# Patient Record
Sex: Female | Born: 1970 | Race: White | Hispanic: No | Marital: Single | Smoking: Former smoker
Health system: Southern US, Community
[De-identification: ages and names within clinical notes are randomized; demographics above are authoritative.]

## PROBLEM LIST (undated history)

## (undated) DIAGNOSIS — D649 Anemia, unspecified: Secondary | ICD-10-CM

## (undated) DIAGNOSIS — E8801 Alpha-1-antitrypsin deficiency: Secondary | ICD-10-CM

## (undated) DIAGNOSIS — J449 Chronic obstructive pulmonary disease, unspecified: Secondary | ICD-10-CM

## (undated) DIAGNOSIS — I10 Essential (primary) hypertension: Secondary | ICD-10-CM

## (undated) DIAGNOSIS — J439 Emphysema, unspecified: Secondary | ICD-10-CM

## (undated) DIAGNOSIS — E119 Type 2 diabetes mellitus without complications: Secondary | ICD-10-CM

## (undated) DIAGNOSIS — F419 Anxiety disorder, unspecified: Secondary | ICD-10-CM

## (undated) DIAGNOSIS — J45909 Unspecified asthma, uncomplicated: Secondary | ICD-10-CM

## (undated) HISTORY — PX: PEG TUBE PLACEMENT: SUR1034

## (undated) HISTORY — PX: PORTACATH PLACEMENT: SHX2246

## (undated) HISTORY — PX: TRACHEOSTOMY: SUR1362

---

## 2017-08-22 ENCOUNTER — Emergency Department (HOSPITAL_COMMUNITY): Payer: Medicare Other

## 2017-08-22 ENCOUNTER — Encounter (HOSPITAL_COMMUNITY): Payer: Self-pay

## 2017-08-22 ENCOUNTER — Inpatient Hospital Stay (HOSPITAL_COMMUNITY)
Admission: EM | Admit: 2017-08-22 | Discharge: 2017-09-25 | DRG: 314 | Disposition: A | Discharge status: Other Acute Inpatient Hospital | Payer: Medicare Other | Source: Other Acute Inpatient Hospital | Attending: Pulmonary Disease | Admitting: Pulmonary Disease

## 2017-08-22 ENCOUNTER — Other Ambulatory Visit: Payer: Self-pay

## 2017-08-22 ENCOUNTER — Inpatient Hospital Stay (HOSPITAL_COMMUNITY): Payer: Medicare Other

## 2017-08-22 DIAGNOSIS — J95851 Ventilator associated pneumonia: Secondary | ICD-10-CM | POA: Diagnosis present

## 2017-08-22 DIAGNOSIS — E876 Hypokalemia: Secondary | ICD-10-CM | POA: Diagnosis not present

## 2017-08-22 DIAGNOSIS — Z930 Tracheostomy status: Secondary | ICD-10-CM

## 2017-08-22 DIAGNOSIS — R0902 Hypoxemia: Secondary | ICD-10-CM | POA: Diagnosis not present

## 2017-08-22 DIAGNOSIS — Z91038 Other insect allergy status: Secondary | ICD-10-CM

## 2017-08-22 DIAGNOSIS — B957 Other staphylococcus as the cause of diseases classified elsewhere: Secondary | ICD-10-CM | POA: Diagnosis present

## 2017-08-22 DIAGNOSIS — B9689 Other specified bacterial agents as the cause of diseases classified elsewhere: Secondary | ICD-10-CM | POA: Diagnosis not present

## 2017-08-22 DIAGNOSIS — J9621 Acute and chronic respiratory failure with hypoxia: Secondary | ICD-10-CM

## 2017-08-22 DIAGNOSIS — R1310 Dysphagia, unspecified: Secondary | ICD-10-CM | POA: Diagnosis present

## 2017-08-22 DIAGNOSIS — Z91040 Latex allergy status: Secondary | ICD-10-CM

## 2017-08-22 DIAGNOSIS — Z515 Encounter for palliative care: Secondary | ICD-10-CM

## 2017-08-22 DIAGNOSIS — Z7189 Other specified counseling: Secondary | ICD-10-CM

## 2017-08-22 DIAGNOSIS — Z888 Allergy status to other drugs, medicaments and biological substances status: Secondary | ICD-10-CM

## 2017-08-22 DIAGNOSIS — E870 Hyperosmolality and hypernatremia: Secondary | ICD-10-CM | POA: Diagnosis not present

## 2017-08-22 DIAGNOSIS — T80211A Bloodstream infection due to central venous catheter, initial encounter: Secondary | ICD-10-CM | POA: Diagnosis present

## 2017-08-22 DIAGNOSIS — F418 Other specified anxiety disorders: Secondary | ICD-10-CM | POA: Diagnosis not present

## 2017-08-22 DIAGNOSIS — D649 Anemia, unspecified: Secondary | ICD-10-CM | POA: Diagnosis not present

## 2017-08-22 DIAGNOSIS — J810 Acute pulmonary edema: Secondary | ICD-10-CM | POA: Diagnosis not present

## 2017-08-22 DIAGNOSIS — J9690 Respiratory failure, unspecified, unspecified whether with hypoxia or hypercapnia: Secondary | ICD-10-CM

## 2017-08-22 DIAGNOSIS — R0602 Shortness of breath: Secondary | ICD-10-CM | POA: Diagnosis not present

## 2017-08-22 DIAGNOSIS — Z7951 Long term (current) use of inhaled steroids: Secondary | ICD-10-CM

## 2017-08-22 DIAGNOSIS — I10 Essential (primary) hypertension: Secondary | ICD-10-CM | POA: Diagnosis present

## 2017-08-22 DIAGNOSIS — Z79899 Other long term (current) drug therapy: Secondary | ICD-10-CM

## 2017-08-22 DIAGNOSIS — R0603 Acute respiratory distress: Secondary | ICD-10-CM | POA: Diagnosis not present

## 2017-08-22 DIAGNOSIS — Z9911 Dependence on respirator [ventilator] status: Secondary | ICD-10-CM | POA: Diagnosis not present

## 2017-08-22 DIAGNOSIS — Z91048 Other nonmedicinal substance allergy status: Secondary | ICD-10-CM

## 2017-08-22 DIAGNOSIS — R0689 Other abnormalities of breathing: Secondary | ICD-10-CM | POA: Diagnosis not present

## 2017-08-22 DIAGNOSIS — E8801 Alpha-1-antitrypsin deficiency: Secondary | ICD-10-CM | POA: Diagnosis present

## 2017-08-22 DIAGNOSIS — R739 Hyperglycemia, unspecified: Secondary | ICD-10-CM | POA: Diagnosis not present

## 2017-08-22 DIAGNOSIS — E1165 Type 2 diabetes mellitus with hyperglycemia: Secondary | ICD-10-CM | POA: Diagnosis present

## 2017-08-22 DIAGNOSIS — R4182 Altered mental status, unspecified: Secondary | ICD-10-CM | POA: Diagnosis not present

## 2017-08-22 DIAGNOSIS — R34 Anuria and oliguria: Secondary | ICD-10-CM | POA: Diagnosis not present

## 2017-08-22 DIAGNOSIS — I959 Hypotension, unspecified: Secondary | ICD-10-CM | POA: Diagnosis present

## 2017-08-22 DIAGNOSIS — R7881 Bacteremia: Secondary | ICD-10-CM | POA: Diagnosis not present

## 2017-08-22 DIAGNOSIS — R451 Restlessness and agitation: Secondary | ICD-10-CM | POA: Diagnosis not present

## 2017-08-22 DIAGNOSIS — Z87891 Personal history of nicotine dependence: Secondary | ICD-10-CM | POA: Diagnosis not present

## 2017-08-22 DIAGNOSIS — J9610 Chronic respiratory failure, unspecified whether with hypoxia or hypercapnia: Secondary | ICD-10-CM

## 2017-08-22 DIAGNOSIS — J9622 Acute and chronic respiratory failure with hypercapnia: Secondary | ICD-10-CM

## 2017-08-22 DIAGNOSIS — Z931 Gastrostomy status: Secondary | ICD-10-CM

## 2017-08-22 DIAGNOSIS — J9611 Chronic respiratory failure with hypoxia: Secondary | ICD-10-CM | POA: Diagnosis not present

## 2017-08-22 DIAGNOSIS — J9601 Acute respiratory failure with hypoxia: Secondary | ICD-10-CM

## 2017-08-22 DIAGNOSIS — F410 Panic disorder [episodic paroxysmal anxiety] without agoraphobia: Secondary | ICD-10-CM | POA: Diagnosis present

## 2017-08-22 DIAGNOSIS — D696 Thrombocytopenia, unspecified: Secondary | ICD-10-CM | POA: Diagnosis not present

## 2017-08-22 DIAGNOSIS — Z91018 Allergy to other foods: Secondary | ICD-10-CM

## 2017-08-22 DIAGNOSIS — D638 Anemia in other chronic diseases classified elsewhere: Secondary | ICD-10-CM | POA: Diagnosis present

## 2017-08-22 DIAGNOSIS — R0600 Dyspnea, unspecified: Secondary | ICD-10-CM

## 2017-08-22 DIAGNOSIS — R110 Nausea: Secondary | ICD-10-CM | POA: Diagnosis not present

## 2017-08-22 DIAGNOSIS — F411 Generalized anxiety disorder: Secondary | ICD-10-CM | POA: Diagnosis present

## 2017-08-22 DIAGNOSIS — E872 Acidosis: Secondary | ICD-10-CM | POA: Diagnosis present

## 2017-08-22 DIAGNOSIS — Z95828 Presence of other vascular implants and grafts: Secondary | ICD-10-CM | POA: Diagnosis not present

## 2017-08-22 DIAGNOSIS — Z7401 Bed confinement status: Secondary | ICD-10-CM

## 2017-08-22 DIAGNOSIS — R0682 Tachypnea, not elsewhere classified: Secondary | ICD-10-CM | POA: Diagnosis not present

## 2017-08-22 DIAGNOSIS — J441 Chronic obstructive pulmonary disease with (acute) exacerbation: Secondary | ICD-10-CM | POA: Diagnosis not present

## 2017-08-22 DIAGNOSIS — I9589 Other hypotension: Secondary | ICD-10-CM | POA: Diagnosis not present

## 2017-08-22 DIAGNOSIS — F419 Anxiety disorder, unspecified: Secondary | ICD-10-CM

## 2017-08-22 DIAGNOSIS — Z538 Procedure and treatment not carried out for other reasons: Secondary | ICD-10-CM | POA: Diagnosis not present

## 2017-08-22 DIAGNOSIS — T17990A Other foreign object in respiratory tract, part unspecified in causing asphyxiation, initial encounter: Secondary | ICD-10-CM | POA: Diagnosis present

## 2017-08-22 DIAGNOSIS — E669 Obesity, unspecified: Secondary | ICD-10-CM | POA: Diagnosis present

## 2017-08-22 DIAGNOSIS — E11649 Type 2 diabetes mellitus with hypoglycemia without coma: Secondary | ICD-10-CM | POA: Diagnosis not present

## 2017-08-22 DIAGNOSIS — Y848 Other medical procedures as the cause of abnormal reaction of the patient, or of later complication, without mention of misadventure at the time of the procedure: Secondary | ICD-10-CM | POA: Diagnosis present

## 2017-08-22 DIAGNOSIS — Z6831 Body mass index (BMI) 31.0-31.9, adult: Secondary | ICD-10-CM

## 2017-08-22 DIAGNOSIS — B9562 Methicillin resistant Staphylococcus aureus infection as the cause of diseases classified elsewhere: Secondary | ICD-10-CM | POA: Diagnosis not present

## 2017-08-22 DIAGNOSIS — Z794 Long term (current) use of insulin: Secondary | ICD-10-CM

## 2017-08-22 DIAGNOSIS — Z91013 Allergy to seafood: Secondary | ICD-10-CM

## 2017-08-22 HISTORY — DX: Chronic obstructive pulmonary disease, unspecified: J44.9

## 2017-08-22 HISTORY — DX: Anemia, unspecified: D64.9

## 2017-08-22 HISTORY — DX: Essential (primary) hypertension: I10

## 2017-08-22 HISTORY — DX: Unspecified asthma, uncomplicated: J45.909

## 2017-08-22 HISTORY — DX: Alpha-1-antitrypsin deficiency: E88.01

## 2017-08-22 HISTORY — DX: Type 2 diabetes mellitus without complications: E11.9

## 2017-08-22 HISTORY — DX: Anxiety disorder, unspecified: F41.9

## 2017-08-22 HISTORY — DX: Emphysema, unspecified: J43.9

## 2017-08-22 LAB — I-STAT BETA HCG BLOOD, ED (MC, WL, AP ONLY): I-stat hCG, quantitative: <5 m[IU]/mL (ref <5)

## 2017-08-22 LAB — COMPREHENSIVE METABOLIC PANEL
ALT: 29 U/L (ref 14–54)
AST: 30 U/L (ref 15–41)
Albumin: 3.3 g/dL — ABNORMAL LOW (ref 3.5–5.0)
Alkaline Phosphatase: 87 U/L (ref 38–126)
Anion gap: 15 (ref 5–15)
BUN: 35 mg/dL — AB (ref 6–20)
CHLORIDE: 90 mmol/L — AB (ref 101–111)
CO2: 33 mmol/L — AB (ref 22–32)
CREATININE: 0.88 mg/dL (ref 0.44–1.00)
Calcium: 10.4 mg/dL — ABNORMAL HIGH (ref 8.9–10.3)
GFR calc Af Amer: >60 mL/min (ref >60)
GLUCOSE: 204 mg/dL — AB (ref 65–99)
Potassium: 5.3 mmol/L — ABNORMAL HIGH (ref 3.5–5.1)
Sodium: 138 mmol/L (ref 135–145)
Total Bilirubin: 0.6 mg/dL (ref 0.3–1.2)
Total Protein: 7 g/dL (ref 6.5–8.1)

## 2017-08-22 LAB — URINALYSIS, ROUTINE W REFLEX MICROSCOPIC
Bilirubin Urine: NEGATIVE
GLUCOSE, UA: NEGATIVE mg/dL
HGB URINE DIPSTICK: NEGATIVE
KETONES UR: NEGATIVE mg/dL
NITRITE: NEGATIVE
PH: 5 (ref 5.0–8.0)
PROTEIN: NEGATIVE mg/dL
Specific Gravity, Urine: 1.016 (ref 1.005–1.030)

## 2017-08-22 LAB — CBC WITH DIFFERENTIAL/PLATELET
BASOS PCT: 0 %
Basophils Absolute: 0 10*3/uL (ref 0.0–0.1)
EOS PCT: 0 %
Eosinophils Absolute: 0 10*3/uL (ref 0.0–0.7)
HEMATOCRIT: 38.1 % (ref 36.0–46.0)
HEMOGLOBIN: 10.2 g/dL — AB (ref 12.0–15.0)
Lymphocytes Relative: 2 %
Lymphs Abs: 0.9 10*3/uL (ref 0.7–4.0)
MCH: 23.4 pg — ABNORMAL LOW (ref 26.0–34.0)
MCHC: 26.8 g/dL — ABNORMAL LOW (ref 30.0–36.0)
MCV: 87.6 fL (ref 78.0–100.0)
MONOS PCT: 10 %
Monocytes Absolute: 4.5 10*3/uL — ABNORMAL HIGH (ref 0.1–1.0)
NEUTROS ABS: 39.3 10*3/uL — AB (ref 1.7–7.7)
Neutrophils Relative %: 88 %
Platelets: 566 10*3/uL — ABNORMAL HIGH (ref 150–400)
RBC: 4.35 MIL/uL (ref 3.87–5.11)
RDW: 19.8 % — ABNORMAL HIGH (ref 11.5–15.5)
SMEAR REVIEW: INCREASED
WBC: 44.7 10*3/uL — ABNORMAL HIGH (ref 4.0–10.5)

## 2017-08-22 LAB — BLOOD GAS, ARTERIAL
Acid-Base Excess: 11.1 mmol/L — ABNORMAL HIGH (ref 0.0–2.0)
Acid-Base Excess: 11 mmol/L — ABNORMAL HIGH (ref 0.0–2.0)
BICARBONATE: 39 mmol/L — AB (ref 20.0–28.0)
Bicarbonate: 39.8 mmol/L — ABNORMAL HIGH (ref 20.0–28.0)
Drawn by: 398991
Drawn by: 256031
FIO2: 100
FIO2: 90
LHR: 24 {breaths}/min
LHR: 20 {breaths}/min
MECHVT: 380 mL
O2 SAT: 99 %
O2 SAT: 99.6 %
PATIENT TEMPERATURE: 97.8
PATIENT TEMPERATURE: 98.6
PCO2 ART: 114 mmHg — AB (ref 32.0–48.0)
PCO2 ART: 103 mmHg — AB (ref 32.0–48.0)
PEEP: 5 cmH2O
PEEP: 5 cmH2O
PH ART: 7.166 — AB (ref 7.350–7.450)
PH ART: 7.204 — AB (ref 7.350–7.450)
PO2 ART: 249 mmHg — AB (ref 83.0–108.0)
VT: 380 mL
pO2, Arterial: 286 mmHg — ABNORMAL HIGH (ref 83.0–108.0)

## 2017-08-22 LAB — PROTIME-INR
INR: 1.07
Prothrombin Time: 13.8 seconds (ref 11.4–15.2)

## 2017-08-22 LAB — I-STAT CG4 LACTIC ACID, ED: LACTIC ACID, VENOUS: 1.23 mmol/L (ref 0.5–1.9)

## 2017-08-22 LAB — GLUCOSE, CAPILLARY
GLUCOSE-CAPILLARY: 224 mg/dL — AB (ref 65–99)
Glucose-Capillary: 228 mg/dL — ABNORMAL HIGH (ref 65–99)

## 2017-08-22 LAB — MAGNESIUM: Magnesium: 2.6 mg/dL — ABNORMAL HIGH (ref 1.7–2.4)

## 2017-08-22 LAB — CBG MONITORING, ED: Glucose-Capillary: 209 mg/dL — ABNORMAL HIGH (ref 65–99)

## 2017-08-22 LAB — D-DIMER, QUANTITATIVE: D-Dimer, Quant: 1.03 ug/mL-FEU — ABNORMAL HIGH (ref 0.00–0.50)

## 2017-08-22 MED ORDER — INSULIN ASPART 100 UNIT/ML ~~LOC~~ SOLN
2.0000 [IU] | SUBCUTANEOUS | Status: DC
  Administered 2017-08-22: 6 [IU] via SUBCUTANEOUS
  Administered 2017-08-23: 4 [IU] via SUBCUTANEOUS
  Administered 2017-08-23 – 2017-08-24 (×3): 2 [IU] via SUBCUTANEOUS
  Administered 2017-08-24: 4 [IU] via SUBCUTANEOUS
  Administered 2017-08-24: 2 [IU] via SUBCUTANEOUS
  Administered 2017-08-24: 4 [IU] via SUBCUTANEOUS
  Administered 2017-08-25: 6 [IU] via SUBCUTANEOUS
  Administered 2017-08-25: 4 [IU] via SUBCUTANEOUS
  Administered 2017-08-25: 6 [IU] via SUBCUTANEOUS
  Administered 2017-08-25: 2 [IU] via SUBCUTANEOUS
  Administered 2017-08-25 – 2017-08-26 (×3): 6 [IU] via SUBCUTANEOUS
  Administered 2017-08-27: 4 [IU] via SUBCUTANEOUS
  Administered 2017-08-27: 2 [IU] via SUBCUTANEOUS
  Administered 2017-08-27 – 2017-08-28 (×4): 4 [IU] via SUBCUTANEOUS
  Administered 2017-08-28: 2 [IU] via SUBCUTANEOUS
  Administered 2017-08-28 (×2): 4 [IU] via SUBCUTANEOUS
  Administered 2017-08-29: 6 [IU] via SUBCUTANEOUS
  Administered 2017-08-29: 2 [IU] via SUBCUTANEOUS
  Administered 2017-08-29: 4 [IU] via SUBCUTANEOUS
  Administered 2017-08-29: 2 [IU] via SUBCUTANEOUS
  Administered 2017-08-30: 4 [IU] via SUBCUTANEOUS
  Administered 2017-08-30: 2 [IU] via SUBCUTANEOUS
  Administered 2017-08-30 – 2017-08-31 (×2): 4 [IU] via SUBCUTANEOUS
  Administered 2017-08-31: 2 [IU] via SUBCUTANEOUS
  Administered 2017-08-31: 6 [IU] via SUBCUTANEOUS
  Administered 2017-08-31: 4 [IU] via SUBCUTANEOUS
  Administered 2017-09-01 (×2): 2 [IU] via SUBCUTANEOUS

## 2017-08-22 MED ORDER — FAMOTIDINE IN NACL 20-0.9 MG/50ML-% IV SOLN
20.0000 mg | Freq: Two times a day (BID) | INTRAVENOUS | Status: DC
  Administered 2017-08-22 – 2017-08-30 (×16): 20 mg via INTRAVENOUS
  Filled 2017-08-22 (×18): qty 50

## 2017-08-22 MED ORDER — ENOXAPARIN SODIUM 40 MG/0.4ML ~~LOC~~ SOLN
40.0000 mg | SUBCUTANEOUS | Status: AC
  Administered 2017-08-22 – 2017-08-25 (×4): 40 mg via SUBCUTANEOUS
  Filled 2017-08-22 (×4): qty 0.4

## 2017-08-22 MED ORDER — IPRATROPIUM-ALBUTEROL 0.5-2.5 (3) MG/3ML IN SOLN
3.0000 mL | RESPIRATORY_TRACT | Status: DC | PRN
  Administered 2017-08-22 (×2): 3 mL via RESPIRATORY_TRACT
  Filled 2017-08-22 (×2): qty 3
  Filled 2017-08-22: qty 6

## 2017-08-22 MED ORDER — METHYLPREDNISOLONE SODIUM SUCC 125 MG IJ SOLR
60.0000 mg | Freq: Four times a day (QID) | INTRAMUSCULAR | Status: DC
  Administered 2017-08-22 – 2017-08-23 (×3): 60 mg via INTRAVENOUS
  Filled 2017-08-22 (×3): qty 2

## 2017-08-22 MED ORDER — CHLORHEXIDINE GLUCONATE CLOTH 2 % EX PADS
6.0000 | MEDICATED_PAD | Freq: Every day | CUTANEOUS | Status: DC
  Administered 2017-08-22 – 2017-09-24 (×30): 6 via TOPICAL

## 2017-08-22 MED ORDER — CHLORHEXIDINE GLUCONATE 0.12% ORAL RINSE (MEDLINE KIT)
15.0000 mL | Freq: Two times a day (BID) | OROMUCOSAL | Status: DC
  Administered 2017-08-22 – 2017-08-23 (×3): 15 mL via OROMUCOSAL

## 2017-08-22 MED ORDER — IPRATROPIUM-ALBUTEROL 0.5-2.5 (3) MG/3ML IN SOLN: 3.0000 mL | Freq: Once | RESPIRATORY_TRACT | Status: DC

## 2017-08-22 MED ORDER — ALBUTEROL SULFATE (2.5 MG/3ML) 0.083% IN NEBU
INHALATION_SOLUTION | RESPIRATORY_TRACT | Status: AC
  Administered 2017-08-22: 5 mg via RESPIRATORY_TRACT
  Filled 2017-08-22: qty 6

## 2017-08-22 MED ORDER — BUDESONIDE 0.5 MG/2ML IN SUSP
0.5000 mg | Freq: Two times a day (BID) | RESPIRATORY_TRACT | Status: DC
  Administered 2017-08-22 – 2017-09-25 (×67): 0.5 mg via RESPIRATORY_TRACT
  Filled 2017-08-22 (×70): qty 2

## 2017-08-22 MED ORDER — ARFORMOTEROL TARTRATE 15 MCG/2ML IN NEBU
15.0000 ug | INHALATION_SOLUTION | Freq: Two times a day (BID) | RESPIRATORY_TRACT | Status: DC
  Administered 2017-08-22 – 2017-09-16 (×50): 15 ug via RESPIRATORY_TRACT
  Filled 2017-08-22 (×50): qty 2

## 2017-08-22 MED ORDER — IPRATROPIUM-ALBUTEROL 0.5-2.5 (3) MG/3ML IN SOLN
3.0000 mL | RESPIRATORY_TRACT | Status: AC | PRN
  Administered 2017-08-22 (×2): 3 mL via RESPIRATORY_TRACT

## 2017-08-22 MED ORDER — METHYLPREDNISOLONE SODIUM SUCC 125 MG IJ SOLR: 60.0000 mg | Freq: Four times a day (QID) | INTRAMUSCULAR | Status: DC

## 2017-08-22 MED ORDER — SODIUM CHLORIDE 0.9% FLUSH: 10.0000 mL | INTRAVENOUS | Status: DC | PRN

## 2017-08-22 MED ORDER — CLONAZEPAM 0.5 MG PO TBDP
1.0000 mg | ORAL_TABLET | Freq: Three times a day (TID) | ORAL | Status: DC
  Administered 2017-08-22 – 2017-09-06 (×43): 1 mg
  Filled 2017-08-22 (×44): qty 2

## 2017-08-22 MED ORDER — FENTANYL 2500MCG IN NS 250ML (10MCG/ML) PREMIX INFUSION
0.0000 ug/h | INTRAVENOUS | Status: DC
Start: 2017-08-22 — End: 2017-08-24
  Administered 2017-08-22: 50 ug/h via INTRAVENOUS
  Administered 2017-08-23: 150 ug/h via INTRAVENOUS
  Administered 2017-08-23: 300 ug/h via INTRAVENOUS
  Administered 2017-08-24: 150 ug/h via INTRAVENOUS
  Filled 2017-08-22 (×4): qty 250

## 2017-08-22 MED ORDER — LEVOFLOXACIN IN D5W 750 MG/150ML IV SOLN
750.0000 mg | Freq: Every day | INTRAVENOUS | Status: DC
  Administered 2017-08-22 – 2017-08-25 (×4): 750 mg via INTRAVENOUS
  Filled 2017-08-22 (×4): qty 150

## 2017-08-22 MED ORDER — ORAL CARE MOUTH RINSE
15.0000 mL | OROMUCOSAL | Status: DC
  Administered 2017-08-22 – 2017-08-24 (×15): 15 mL via OROMUCOSAL

## 2017-08-22 MED ORDER — FENTANYL CITRATE (PF) 100 MCG/2ML IJ SOLN
50.0000 ug | INTRAMUSCULAR | Status: DC | PRN
  Administered 2017-08-22 – 2017-08-23 (×2): 100 ug via INTRAVENOUS
  Administered 2017-08-23: 50 ug via INTRAVENOUS
  Administered 2017-08-23 (×2): 100 ug via INTRAVENOUS
  Administered 2017-08-23: 50 ug via INTRAVENOUS
  Administered 2017-08-24: 100 ug via INTRAVENOUS
  Filled 2017-08-22 (×3): qty 2

## 2017-08-22 MED ORDER — MIDAZOLAM HCL 2 MG/2ML IJ SOLN
4.0000 mg | Freq: Once | INTRAMUSCULAR | Status: AC
  Administered 2017-08-22: 4 mg via INTRAVENOUS
  Filled 2017-08-22: qty 4

## 2017-08-22 MED ORDER — IPRATROPIUM-ALBUTEROL 0.5-2.5 (3) MG/3ML IN SOLN
3.0000 mL | RESPIRATORY_TRACT | Status: DC
  Administered 2017-08-22 – 2017-09-01 (×57): 3 mL via RESPIRATORY_TRACT
  Filled 2017-08-22 (×56): qty 3

## 2017-08-22 MED ORDER — MONTELUKAST SODIUM 10 MG PO TABS
10.0000 mg | ORAL_TABLET | Freq: Every day | ORAL | Status: DC
  Administered 2017-08-22 – 2017-08-25 (×4): 10 mg via ORAL
  Filled 2017-08-22 (×4): qty 1

## 2017-08-22 MED ORDER — LORAZEPAM 2 MG/ML IJ SOLN
2.0000 mg | INTRAMUSCULAR | Status: DC | PRN
  Administered 2017-08-22 – 2017-08-28 (×16): 2 mg via INTRAVENOUS
  Filled 2017-08-22 (×16): qty 1

## 2017-08-22 MED ORDER — SODIUM CHLORIDE 0.9 % IV SOLN
250.0000 mL | INTRAVENOUS | Status: DC | PRN
  Administered 2017-08-22: 250 mL via INTRAVENOUS
  Administered 2017-08-26: 12:00:00 via INTRAVENOUS
  Administered 2017-08-27: 250 mL via INTRAVENOUS

## 2017-08-22 MED ORDER — IPRATROPIUM BROMIDE 0.02 % IN SOLN
0.5000 mg | Freq: Once | RESPIRATORY_TRACT | Status: AC
  Administered 2017-08-22: 0.5 mg via RESPIRATORY_TRACT
  Filled 2017-08-22: qty 2.5

## 2017-08-22 MED ORDER — INSULIN ASPART 100 UNIT/ML ~~LOC~~ SOLN: 0.0000 [IU] | Freq: Three times a day (TID) | SUBCUTANEOUS | Status: DC

## 2017-08-22 MED ORDER — BUSPIRONE HCL 5 MG PO TABS
15.0000 mg | ORAL_TABLET | Freq: Two times a day (BID) | ORAL | Status: DC
  Administered 2017-08-22 – 2017-08-25 (×7): 15 mg via ORAL
  Filled 2017-08-22 (×7): qty 3

## 2017-08-22 MED ORDER — ALBUTEROL SULFATE (2.5 MG/3ML) 0.083% IN NEBU
2.5000 mg | INHALATION_SOLUTION | RESPIRATORY_TRACT | Status: DC | PRN
  Administered 2017-08-23 – 2017-09-22 (×21): 2.5 mg via RESPIRATORY_TRACT
  Filled 2017-08-22 (×22): qty 3

## 2017-08-22 MED ORDER — ALBUTEROL SULFATE (2.5 MG/3ML) 0.083% IN NEBU
5.0000 mg | INHALATION_SOLUTION | Freq: Once | RESPIRATORY_TRACT | Status: AC
  Administered 2017-08-22: 5 mg via RESPIRATORY_TRACT

## 2017-08-22 MED ORDER — INSULIN GLARGINE 100 UNIT/ML ~~LOC~~ SOLN
10.0000 [IU] | Freq: Every day | SUBCUTANEOUS | Status: DC
  Administered 2017-08-22 – 2017-08-23 (×2): 10 [IU] via SUBCUTANEOUS
  Filled 2017-08-22 (×2): qty 0.1

## 2017-08-22 MED ORDER — PAROXETINE HCL 30 MG PO TABS
30.0000 mg | ORAL_TABLET | Freq: Every day | ORAL | Status: DC
  Administered 2017-08-23 – 2017-09-25 (×34): 30 mg
  Filled 2017-08-22 (×40): qty 1

## 2017-08-22 MED ORDER — SODIUM CHLORIDE 0.9% FLUSH
10.0000 mL | Freq: Two times a day (BID) | INTRAVENOUS | Status: DC
  Administered 2017-08-22: 20 mL
  Administered 2017-08-24 – 2017-09-08 (×21): 10 mL
  Administered 2017-09-08: 20 mL
  Administered 2017-09-09 – 2017-09-18 (×15): 10 mL
  Administered 2017-09-19 – 2017-09-20 (×2): 20 mL
  Administered 2017-09-20 – 2017-09-25 (×9): 10 mL

## 2017-08-22 MED ORDER — ACETYLCYSTEINE 20 % IN SOLN
4.0000 mL | RESPIRATORY_TRACT | Status: DC
  Administered 2017-08-22 – 2017-08-24 (×9): 4 mL via RESPIRATORY_TRACT
  Filled 2017-08-22 (×12): qty 4

## 2017-08-22 MED ORDER — LORAZEPAM 2 MG/ML IJ SOLN
1.0000 mg | Freq: Once | INTRAMUSCULAR | Status: AC
  Administered 2017-08-22: 1 mg via INTRAVENOUS
  Filled 2017-08-22: qty 1

## 2017-08-22 MED ORDER — ALBUTEROL (5 MG/ML) CONTINUOUS INHALATION SOLN: 5.0000 mg/h | INHALATION_SOLUTION | Freq: Once | RESPIRATORY_TRACT | Status: DC

## 2017-08-22 MED ORDER — METHYLPREDNISOLONE SODIUM SUCC 125 MG IJ SOLR
125.0000 mg | Freq: Once | INTRAMUSCULAR | Status: AC
  Administered 2017-08-22: 125 mg via INTRAVENOUS
  Filled 2017-08-22: qty 2

## 2017-08-22 NOTE — ED Notes (Signed)
Patient is stable and ready to be transport to the floor at this time.  Report was called to 3M RN.  Belongings taken with the patient to the floor.   

## 2017-08-22 NOTE — ED Triage Notes (Signed)
Patient from [facility] for being unresponsive for an hour prior to EMS arrival, vent settings increased by staff at [facility] to max then called EMS.  EMS suctioned and gave duoneb.  Sats 98%, still red in the face, lung sounds decreased diffusely.

## 2017-08-22 NOTE — Progress Notes (Signed)
Post ventilator ABG results obtained.  Results given to MD.  Increased respiratory rate from 24 to 30,per MD, and decreased FIO2 to 70%.  Will continue to monitor.    Ref. Range 08/22/2017 17:00  Sample type Unknown ARTERIAL DRAW  Delivery systems Unknown VENTILATOR  FIO2 Unknown 100.00  Mode Unknown PRESSURE REGULATE...  VT Latest Units: mL 380  Peep/cpap Latest Units: cm H20 5.0  pH, Arterial Latest Ref Range: 7.350 - 7.450  7.166 (LL)  pCO2 arterial Latest Ref Range: 32.0 - 48.0 mmHg 114 (HH)  pO2, Arterial Latest Ref Range: 83.0 - 108.0 mmHg 249 (H)  Acid-Base Excess Latest Ref Range: 0.0 - 2.0 mmol/L 11.1 (H)  Bicarbonate Latest Ref Range: 20.0 - 28.0 mmol/L 39.8 (H)  O2 Saturation Latest Units: % 99.0  Patient temperature Unknown 97.8  Collection site Unknown RIGHT RADIAL  Allens test (pass/fail) Latest Ref Range: PASS  PASS

## 2017-08-22 NOTE — Progress Notes (Signed)
Asked to emergently evaluate patient with COPD, A1AT deficiency, DM, Anxiety, Trach on chronic vent, admitted with acute hypercapneic respiratory failure. Prior to my evaluation, patient had been wheezing and was given nebs and suctioned with large tan thick mucous plug returned. Despite this patient continued to have increased work of breathing. On my exam patient is sitting upright in bed and holding on to the bed rails. She has tachypnea with accessory muscle use. She reports feeling very anxious. Lungs diminished breath sounds bilaterally but no wheezing at this time. No LE edema. CXR on my exam shows no pneumothorax, edema, infiltrates, or effusions. Feel much of her symptoms at this time are due to her anxiety. Obtained ABG which shows mild improvement in her hypercapnea compared to earlier tonight. Regarding her anxiety, she says she is on buspar at home, and her records show 15mg  BID. Will resume this medication. She also takes klonopin 1mg  TID at home which she is receiving here. She has an order for Ativan IV PRN but records show last dose was at 17:29. Will ask RN to give it as needed (ordered q2hr PRN). Regarding her COPD, she says she is on an ICS/LABA at home. Therefore will start pulmicort and brovana nebs BID; continue duonebs q4hrs scheduled and solumedrol 60mg  IV q6hrs. Regarding her mucous plugging, will obtain sputum culture and start chest PT q4hrs to be given with the duonebs. Will start mucomyst nebs with chest PT as well. There is report that patient was unresponsive for an hour prior to EMS arriving at [facility], so concern that patient could've aspirated. Will start Levaquin and await culture results. Check lactate and procalcitonin. Regarding her DM, she remains hyperglycemic. Will increase her SSI from TID to q4hr checks. Will plan to repeat ABG at 1am.   60 minutes critical care time  Milana Obey, MD Pulmonary & Critical Care Medicine Pager: [phone]

## 2017-08-22 NOTE — Progress Notes (Signed)
Patient arrived via EMS being bagged and in respiratory distress.  Placed patient on ventilator and attempted to suction patient however only able to obtain a small amount of pink tinged secretions.  Upon auscultation, patient noted to have very decreased lung sounds and not moving any air.  Attempted to bag lavage patient however with no improvement.  Gave patient 5mg  Albuterol and 0.5mg  Atrovent nebulizer treatment.  Patient still noted to be diminished throughout.  Will obtain an ABG.  Will continue to monitor.

## 2017-08-22 NOTE — ED Notes (Signed)
Patient unable to lay back or roll for a rectal temp or In and Out cath, once patient is able to tolerate moving specimens will be collected.

## 2017-08-22 NOTE — ED Provider Notes (Signed)
[facility] Provider Note   CSN: [id number] Arrival date & time: 08/22/17  1554     History   Chief Complaint Chief Complaint  Patient presents with  . Respiratory Distress  . Shortness of Breath    HPI Tanika Bracco is a 47 y.o. female.  Level 5 caveat due to patient being with tracheotomy.  According to nursing home patient had sudden need for increase in FiO2 as she appeared to have increased work of breathing and tachypnea but no increased sputum production.  Patient has not had fever.  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  Patient also has history of insulin-dependent diabetes.  Nursing home states that patient currently was on 10 of PEEP and 60 of FiO2 and on pressure support and rate of 30.  Concern for COPD exacerbation possible pneumonia per nursing home/RT. Has hx of panic attacks.   The history is provided by the EMS personnel and the nursing home.   Shortness of Breath   This is a chronic problem. The problem occurs frequently.The current episode started less than 1 hour ago. The problem has been rapidly improving. It is unknown what precipitated the problem. She has tried nothing for the symptoms. The treatment provided no relief. She has had prior hospitalizations. Associated medical issues include COPD.    Past Medical History:  Diagnosis Date  . Alpha-1-antitrypsin deficiency ([place])   . Anemia   . Anxiety   . Asthma   . COPD (chronic obstructive pulmonary disease) ([place])   . Diabetes mellitus without complication ([place])   . Emphysema lung ([place])   . Hypertension     Patient Active Problem List   Diagnosis Date Noted  . Hypercarbia 08/22/2017    Past Surgical History:  Procedure Laterality Date  . PEG TUBE PLACEMENT    . TRACHEOSTOMY       OB History   None      Home Medications    Prior to Admission medications   Medication Sig Start Date End Date Taking?  Authorizing Provider  acetaZOLAMIDE (DIAMOX) 250 MG tablet Take 250 mg by mouth.   Yes [provider]  busPIRone (BUSPAR) 15 MG tablet Take 15 mg by mouth 2 (two) times daily.   Yes [provider]  calcium carbonate (TUMS - DOSED IN MG ELEMENTAL CALCIUM) 500 MG chewable tablet Place 1 tablet into feeding tube every 8 (eight) hours as needed for indigestion or heartburn.   Yes [provider]  cetirizine (ZYRTEC) 10 MG tablet Place 10 mg into feeding tube at bedtime.   Yes [provider]  clonazePAM (KLONOPIN) 1 MG tablet Place 1 mg into feeding tube 3 (three) times daily.   Yes [provider]  diltiazem (CARDIZEM) 30 MG tablet Take 30 mg by mouth every 8 (eight) hours.   Yes [provider]  doxepin (SINEQUAN) 10 MG capsule Place 10 mg into feeding tube at bedtime as needed (for insomnia).   Yes [provider]  enoxaparin (LOVENOX) 40 MG/0.4ML injection Inject 40 mg into the skin daily.   Yes [provider]  furosemide (LASIX) 40 MG tablet Place 40 mg into feeding tube daily.   Yes [provider]  Heparin Lock Flush (HEP-LOCK IV) Inject 500 Units into the vein every 12 (twelve) hours. "FOR PATENCY"   Yes [provider]  insulin aspart (NOVOLOG) 100 unit/mL injection Inject 3-13 Units into the skin See admin instructions. Inject  3-13 units into the skin three times a day before meals, per sliding scale: BGL 151-200 = 3 units; 201-250 = 5 units; 251-300 = 7 units; 301-350 = 9 units; 351-400 = 11 units; 401-450 = 13 units and call the MD   Yes [provider]  insulin detemir (LEVEMIR) 100 unit/ml SOLN Inject 18 Units into the skin at bedtime.   Yes [provider]  LORazepam (ATIVAN) 2 MG/ML concentrated solution Place 0.5 mg into feeding tube every 12 (twelve) hours as needed for anxiety.   Yes [provider]  metoprolol tartrate (LOPRESSOR) 50 MG tablet Place 50 mg into feeding  tube 2 times daily at 12 noon and 4 pm.   Yes [provider]  Metoprolol Tartrate (METOPROLOL TARTARATE 1 MG/ML SYRINGE, 5ML,) Inject 5 mg into the vein every 4 (four) hours as needed (for hypertension).   Yes [provider]  montelukast (SINGULAIR) 10 MG tablet Place 10 mg into feeding tube at bedtime.   Yes [provider]  omeprazole (PRILOSEC) 40 MG capsule 40 mg per tube every morning   Yes [provider]  ondansetron (ZOFRAN) 4 MG tablet Place 4 mg into feeding tube every 4 (four) hours as needed for nausea or vomiting.   Yes [provider]  oxyCODONE (OXY IR/ROXICODONE) 5 MG immediate release tablet Take 5 mg by mouth every 8 (eight) hours as needed for severe pain.   Yes [provider]  PARoxetine (PAXIL) 30 MG tablet Place 30 mg into feeding tube daily.   Yes [provider]  potassium chloride 20 MEQ/15ML (10%) SOLN Place 20 mEq into feeding tube daily.   Yes [provider]  PRESCRIPTION MEDICATION Hydralazine 20 mg/ml: 0.5 ml per tube every four hours as needed for hypertension   Yes [provider]  QUEtiapine (SEROQUEL) 50 MG tablet Place 50 mg into feeding tube 2 (two) times daily.   Yes [provider]  rOPINIRole (REQUIP) 0.5 MG tablet Place 0.5 mg into feeding tube at bedtime.   Yes [provider]    Family History History reviewed. No pertinent family history.  Social History Social History   Tobacco Use  . Smoking status: Former Smoker    Types: Cigarettes  . Smokeless tobacco: Never Used  Substance Use Topics  . Alcohol use: Not Currently  . Drug use: Never     Allergies   Wasp venom protein; Adhesive [tape]; Coconut oil; Latex; Robitussin severe multi-symp [[id number]-[id number]]; and Shellfish-derived products   Review of Systems Review of Systems  Unable to perform ROS: Patient nonverbal  Respiratory: Positive for shortness of breath.       Physical Exam Updated Vital Signs  ED Triage Vitals  Enc Vitals Group     BP 08/22/17 1600 118/84     Pulse Rate 08/22/17 1600 (!) 113     Resp 08/22/17 1600 (!) 25     Temp 08/22/17 1605 97.8 F (36.6 C)     Temp Source 08/22/17 1605 Temporal     SpO2 08/22/17 1600 98 %     Weight 08/22/17 1710 165 lb (74.8 kg)     Height 08/22/17 1710 _0  (1.626 m)     Head Circumference --      Peak Flow --      Pain Score --      Pain Loc --      Pain Edu? --      Excl. in [place]? --     Physical  Exam  Constitutional: She appears well-developed and well-nourished. No distress.  HENT:  Head: Normocephalic and atraumatic.  Eyes: Pupils are equal, round, and reactive to light. Conjunctivae and EOM are normal.  Neck: Normal range of motion. Neck supple.  Cardiovascular: Regular rhythm. Tachycardia present.  No murmur heard. Pulmonary/Chest: Tachypnea noted. She is in respiratory distress. She has decreased breath sounds.  Poor air flow, trach in place, no wheezes  Abdominal: Soft. Bowel sounds are normal. There is no tenderness.  Musculoskeletal: Normal range of motion. She exhibits no edema.  Neurological: She is alert.  Skin: Skin is warm and dry. Capillary refill takes less than 2 seconds.  Psychiatric: She has a normal mood and affect.  Nursing note and vitals reviewed.    ED Treatments / Results  Labs (all labs ordered are listed, but only abnormal results are displayed) Labs Reviewed  COMPREHENSIVE METABOLIC PANEL - Abnormal; Notable for the following components:      Result Value   Potassium 5.3 (*)    Chloride 90 (*)    CO2 33 (*)    Glucose, Bld 204 (*)    BUN 35 (*)    Calcium 10.4 (*)    Albumin 3.3 (*)    All other components within normal limits  CBC WITH DIFFERENTIAL/PLATELET - Abnormal; Notable for the following components:   WBC 44.7 (*)    Hemoglobin 10.2 (*)    MCH 23.4 (*)    MCHC 26.8 (*)    RDW 19.8 (*)    Platelets 566 (*)    Neutro Abs 39.3  (*)    Monocytes Absolute 4.5 (*)    All other components within normal limits  URINALYSIS, ROUTINE W REFLEX MICROSCOPIC - Abnormal; Notable for the following components:   APPearance HAZY (*)    Leukocytes, UA SMALL (*)    Bacteria, UA RARE (*)    All other components within normal limits  BLOOD GAS, ARTERIAL - Abnormal; Notable for the following components:   pH, Arterial 7.166 (*)    pCO2 arterial 114 (*)    pO2, Arterial 249 (*)    Bicarbonate 39.8 (*)    Acid-Base Excess 11.1 (*)    All other components within normal limits  MAGNESIUM - Abnormal; Notable for the following components:   Magnesium 2.6 (*)    All other components within normal limits  D-DIMER, QUANTITATIVE (NOT AT [facility]) - Abnormal; Notable for the following components:   D-Dimer, Quant 1.03 (*)    All other components within normal limits  GLUCOSE, CAPILLARY - Abnormal; Notable for the following components:   Glucose-Capillary 224 (*)    All other components within normal limits  BLOOD GAS, ARTERIAL - Abnormal; Notable for the following components:   pH, Arterial 7.204 (*)    pCO2 arterial 103 (*)    pO2, Arterial 286 (*)    Bicarbonate 39.0 (*)    Acid-Base Excess 11.0 (*)    All other components within normal limits  CBG MONITORING, ED - Abnormal; Notable for the following components:   Glucose-Capillary 209 (*)    All other components within normal limits  CULTURE, BLOOD (ROUTINE X 2)  CULTURE, BLOOD (ROUTINE X 2)  CULTURE, EXPECTORATED SPUTUM-ASSESSMENT  CULTURE, RESPIRATORY (NON-EXPECTORATED)  MRSA PCR SCREENING  CULTURE, RESPIRATORY (NON-EXPECTORATED)  PROTIME-INR  HIV ANTIBODY (ROUTINE TESTING)  BLOOD GAS, ARTERIAL  HEMOGLOBIN S2G  BASIC METABOLIC PANEL  CBC  MAGNESIUM  PHOSPHORUS  LACTIC ACID, PLASMA  PROCALCITONIN  PROCALCITONIN  I-STAT CG4 LACTIC ACID,  ED  I-STAT BETA HCG BLOOD, ED ([facility], [facility], [facility] ONLY)    EKG EKG Interpretation  Date/Time:  Friday August 22 2017 15:56:38  EDT Ventricular Rate:  112 PR Interval:    QRS Duration: 76 QT Interval:  292 QTC Calculation: 399 R Axis:   86 Text Interpretation:  Sinus tachycardia No prior ECG for comparison.  No STEMI Confirmed by Antony Blackbird [phone]) on 08/22/2017 4:21:42 PM   Radiology Dg Chest Port 1 View  Result Date: 08/22/2017 CLINICAL DATA:  Shortness of breath. EXAM: PORTABLE CHEST 1 VIEW COMPARISON:  08/22/2017 FINDINGS: Tracheostomy with tip measuring 9.7 cm above the carina. Right central venous catheter with tip over the upper SVC region. Severe emphysematous changes in the lungs, particularly on the left. Scarring in both lungs. No airspace disease or consolidation. No blunting of costophrenic angles. No pneumothorax. Mediastinal contours appear intact. Heart size and pulmonary vascularity are normal. IMPRESSION: Severe emphysematous changes in the lungs with scattered fibrosis. No active infiltration. Electronically Signed   By: Lucienne Capers M.D.   On: 08/22/2017 22:07   Dg Chest Portable 1 View  Result Date: 08/22/2017 CLINICAL DATA:  Shortness of breath.  History of tracheostomy. EXAM: PORTABLE CHEST 1 VIEW COMPARISON:  None. FINDINGS: The heart size and mediastinal contours are within normal limits. Tracheostomy tube present which appears in appropriate position. Right-sided port also present with the catheter tip located in the upper SVC. Lungs show severe emphysematous disease with large bulla formation in the left mid to lower lung. Both lungs are mild to moderately hyperinflated. There is no evidence of pulmonary edema, consolidation, pneumothorax, nodule or pleural fluid. The visualized skeletal structures are unremarkable. IMPRESSION: Severe emphysematous lung disease with bilateral hyperinflation. Electronically Signed   By: Aletta Edouard M.D.   On: 08/22/2017 16:24    Procedures Procedures (including critical care time)  Medications Ordered in ED Medications  ipratropium-albuterol  (DUONEB) 0.5-2.5 (3) MG/3ML nebulizer solution 3 mL (3 mLs Nebulization Given 08/22/17 1912)  0.9 %  sodium chloride infusion (250 mLs Intravenous New Bag/Given 08/22/17 2206)  enoxaparin (LOVENOX) injection 40 mg (40 mg Subcutaneous Given 08/22/17 2206)  famotidine (PEPCID) IVPB 20 mg premix (0 mg Intravenous Stopped 08/22/17 2236)  clonazePAM (KLONOPIN) disintegrating tablet 1 mg (1 mg Per Tube Given 08/22/17 2206)  PARoxetine (PAXIL) tablet 30 mg (has no administration in time range)  montelukast (SINGULAIR) tablet 10 mg (10 mg Oral Given 08/22/17 2214)  insulin glargine (LANTUS) injection 10 Units (10 Units Subcutaneous Given 08/22/17 2207)  methylPREDNISolone sodium succinate (SOLU-MEDROL) 125 mg/2 mL injection 60 mg (has no administration in time range)  sodium chloride flush (NS) 0.9 % injection 10-40 mL (20 mLs Intracatheter Given 08/22/17 2217)  sodium chloride flush (NS) 0.9 % injection 10-40 mL (has no administration in time range)  Chlorhexidine Gluconate Cloth 2 % PADS 6 each (6 each Topical Given 08/22/17 2048)  chlorhexidine gluconate (MEDLINE KIT) (PERIDEX) 0.12 % solution 15 mL (15 mLs Mouth Rinse Given 08/22/17 2135)  MEDLINE mouth rinse (15 mLs Mouth Rinse Given 08/22/17 2217)  fentaNYL (SUBLIMAZE) injection 50-100 mcg (100 mcg Intravenous Given 08/22/17 2206)  LORazepam (ATIVAN) injection 2 mg (has no administration in time range)  albuterol (PROVENTIL) (2.5 MG/3ML) 0.083% nebulizer solution 2.5 mg (has no administration in time range)  fentaNYL 2557mg in NS 2521m(1067mml) infusion-PREMIX (has no administration in time range)  insulin aspart (novoLOG) injection 2-6 Units (has no administration in time range)  budesonide (PULMICORT) nebulizer solution 0.5 mg (has no  administration in time range)  arformoterol (BROVANA) nebulizer solution 15 mcg (has no administration in time range)  busPIRone (BUSPAR) tablet 15 mg (has no administration in time range)  levofloxacin (LEVAQUIN) IVPB 750  mg (has no administration in time range)  acetylcysteine (MUCOMYST) 10 % nebulizer solution 4 mL (has no administration in time range)  ipratropium (ATROVENT) nebulizer solution 0.5 mg (0.5 mg Nebulization Given 08/22/17 1652)  albuterol (PROVENTIL) (2.5 MG/3ML) 0.083% nebulizer solution 5 mg (5 mg Nebulization Given 08/22/17 1652)  methylPREDNISolone sodium succinate (SOLU-MEDROL) 125 mg/2 mL injection 125 mg (125 mg Intravenous Given 08/22/17 1705)  LORazepam (ATIVAN) injection 1 mg (1 mg Intravenous Given 08/22/17 1729)  midazolam (VERSED) injection 4 mg (4 mg Intravenous Given 08/22/17 2133)  ipratropium-albuterol (DUONEB) 0.5-2.5 (3) MG/3ML nebulizer solution 3 mL (3 mLs Nebulization Given 08/22/17 2135)     Initial Impression / Assessment and Plan / ED Course  I have reviewed the triage vital signs and the nursing notes.  Pertinent labs & imaging results that were available during my care of the patient were reviewed by me and considered in my medical decision making (see chart for details).     Danielle Stevens is a 47 year old female with history of alpha-1 antitrypsin deficiency, emphysema, chronic ventilatory dependent respiratory failure, anxiety, diabetes, hypertension who presents to the ED with respiratory distress.  Patient with mild tachycardia upon arrival, tachypnea and requiring 100% FiO2.  Attempted to wean FiO2 patient got hypoxic.  Patient with not much secretions.  No fever.  Normal blood pressure.  [facility] in [place] for further history and they state that patient has been on pressure support with a rate of 30 and an FiO2 of 60 and a 10 of PEEP when all of a sudden she had increased work of breathing and increasing need of oxygen.  They tried to suction her but they did not get any sputum or mucous plug out.  Patient has had some panic attacks but concern for possible COPD exacerbation.  Has not had any fevers or thick secretions.  However, will evaluate for  infectious etiology.  Likely COPD exacerbation.  Possible anxiety component.  Patient with diminished breath sounds on exam but no obvious wheezing.  Appears distressed and uncomfortable.  Shakes her head no to chest pain, abdominal pain.  Will give patient continuous albuterol and ipratropium nebulizer and IV Solu-Medrol.  Will send for blood gas and obtain chest x-ray, basic labs as well.  EKG shows sinus tachycardia with no signs of ischemic changes.  Will give patient 1 mg of Ativan as well.  Patient with blood gases shows acidosis of 7.1 with hypercarbia at greater than 100 but O2 within normal limits. Increased rate of ventilator. Patient with chest x-ray that showed no signs of pneumonia, pneumothorax, pleural effusion.  However, patient with leukocytosis of 44 and was started on Levaquin per ICU. However, no fever, no increasing sputum production and will collect sputum cx if able. Patient was given continuous breathing treatments.  Patient felt improved following adjustments of her vent and breathing treatments.  Patient handed off to ICU with lab work still pending and they will admit for further care.  Suspect COPD exacerbation and likely ventilator associated pneumonia as well causing increasing 02 requirement.  Final Clinical Impressions(s) / ED Diagnoses   Final diagnoses:  Acute on chronic respiratory failure with hypoxia and hypercapnia (HCC)  COPD exacerbation (HCC)  VAP (ventilator-associated pneumonia) [facility])    ED Discharge Orders    None  Lennice Sites, DO 08/22/17 2249  Tegeler, Gwenyth Allegra, MD 08/23/17 [phone]

## 2017-08-22 NOTE — ED Notes (Signed)
Joice Lofts (Daughter) [phone]  Chaplain spoke with daughter who lives 5 hours away and requests to be called if something is life-threatening since she lives far away.

## 2017-08-22 NOTE — ED Notes (Signed)
From history in paperwork patient was hospitalized from 05-16-17 to 06-03-17 for COPD exacerbation which required intubation.  Tracheostomy done later and discharged to SN[facility] with a Trach.  Underwent a bronchoscopy during hospitalization which showed thickened secretions.  Pertinent medical history includes COPD, asthma, Alpha-1-antitrypsin deficiency and HTN.  Has a 225.00 pack year history of cigarettes.

## 2017-08-22 NOTE — Progress Notes (Signed)
Sputum sample obtained and sent to lab by RT. 

## 2017-08-22 NOTE — H&P (Signed)
PULMONARY / CRITICAL CARE MEDICINE   Name: Danielle Stevens MRN: [id number] DOB: 1971/03/31    ADMISSION DATE:  08/22/2017  CHIEF COMPLAINT: Unresponsive  HISTORY OF PRESENT ILLNESS:         This is a very unfortunate 47 year old who suffers from alpha-1 antitrypsin deficiency and has ventilator dependent respiratory failure.  At [facility] earlier today she was suddenly unresponsive and EMS was called.  She had minimal tracheal secretions.  She was suctioning given several nebulizer treatments in route to the hospital for her initial blood gas showed a pH of 7.16 a PCO2 of 114 and a PO2 of 249.  She is now awake and interactive and tells me that her breathing seems to be at baseline.  She denies any recent change in cough she denies any chest pain.   PAST MEDICAL HISTORY :  She  has a past medical history of Alpha-1-antitrypsin deficiency (HCC), Anemia, Anxiety, Asthma, COPD (chronic obstructive pulmonary disease) (HCC), Diabetes mellitus without complication (HCC), Emphysema lung (HCC), and Hypertension.  PAST SURGICAL HISTORY: She  has a past surgical history that includes Tracheostomy and PEG tube placement.  Allergies  Allergen Reactions  . Wasp Venom Protein Shortness Of Breath  . Adhesive [Tape] Other (See Comments)    Bruises   . Coconut Oil Hives  . Latex Other (See Comments)    Bruises   . Robitussin Severe Multi-Symp [Phenylephrine-Dm-Gg-Apap] Diarrhea and Other (See Comments)    Allergic, per verbal MAR  . Shellfish-Derived Products Hives and Other (See Comments)    Allergic, per verbal MAR    No current facility-administered medications on file prior to encounter.    Current Outpatient Medications on File Prior to Encounter  Medication Sig  . acetaZOLAMIDE (DIAMOX) 250 MG tablet Take 250 mg by mouth.  . busPIRone (BUSPAR) 15 MG tablet Take 15 mg by mouth 2 (two) times daily.  . calcium carbonate (TUMS - DOSED IN MG ELEMENTAL CALCIUM) 500 MG chewable tablet Place  1 tablet into feeding tube every 8 (eight) hours as needed for indigestion or heartburn.  . cetirizine (ZYRTEC) 10 MG tablet Place 10 mg into feeding tube at bedtime.  . clonazePAM (KLONOPIN) 1 MG tablet Place 1 mg into feeding tube 3 (three) times daily.  Marland Kitchen diltiazem (CARDIZEM) 30 MG tablet Take 30 mg by mouth every 8 (eight) hours.  Marland Kitchen doxepin (SINEQUAN) 10 MG capsule Place 10 mg into feeding tube at bedtime as needed (for insomnia).  . enoxaparin (LOVENOX) 40 MG/0.4ML injection Inject 40 mg into the skin daily.  . furosemide (LASIX) 40 MG tablet Place 40 mg into feeding tube daily.  . Heparin Lock Flush (HEP-LOCK IV) Inject 500 Units into the vein every 12 (twelve) hours. "FOR PATENCY"  . insulin aspart (NOVOLOG) 100 unit/mL injection Inject 3-13 Units into the skin See admin instructions. Inject 3-13 units into the skin three times a day before meals, per sliding scale: BGL 151-200 = 3 units; 201-250 = 5 units; 251-300 = 7 units; 301-350 = 9 units; 351-400 = 11 units; 401-450 = 13 units and call the MD  . insulin detemir (LEVEMIR) 100 unit/ml SOLN Inject 18 Units into the skin at bedtime.  Marland Kitchen LORazepam (ATIVAN) 2 MG/ML concentrated solution Place 0.5 mg into feeding tube every 12 (twelve) hours as needed for anxiety.  . metoprolol tartrate (LOPRESSOR) 50 MG tablet Place 50 mg into feeding tube 2 times daily at 12 noon and 4 pm.  . Metoprolol Tartrate (METOPROLOL TARTARATE 1 MG/ML SYRINGE,  ,) Inject 5 mg into the vein every 4 (four) hours as needed (for hypertension).  . montelukast (SINGULAIR) 10 MG tablet Place 10 mg into feeding tube at bedtime.  Marland Kitchen omeprazole (PRILOSEC) 40 MG capsule 40 mg per tube every morning  . ondansetron (ZOFRAN) 4 MG tablet Place 4 mg into feeding tube every 4 (four) hours as needed for nausea or vomiting.  Marland Kitchen oxyCODONE (OXY IR/ROXICODONE) 5 MG immediate release tablet Take 5 mg by mouth every 8 (eight) hours as needed for severe pain.  Marland Kitchen PARoxetine (PAXIL) 30 MG tablet  Place 30 mg into feeding tube daily.  . potassium chloride 20 MEQ/15ML (10%) SOLN Place 20 mEq into feeding tube daily.  Marland Kitchen PRESCRIPTION MEDICATION Hydralazine 20 mg/ml: 0.5 ml per tube every four hours as needed for hypertension  . QUEtiapine (SEROQUEL) 50 MG tablet Place 50 mg into feeding tube 2 (two) times daily.  Marland Kitchen rOPINIRole (REQUIP) 0.5 MG tablet Place 0.5 mg into feeding tube at bedtime.    FAMILY HISTORY:  Her has no family status information on file.    SOCIAL HISTORY: She  reports that she has quit smoking. Her smoking use included cigarettes. She has never used smokeless tobacco. She reports that she drank alcohol. She reports that she does not use drugs.  REVIEW OF SYSTEMS:   Unobtainable  SUBJECTIVE:  As above  VITAL SIGNS: BP (!) 149/82   Pulse (!) 105   Temp (S) 98.1 F (36.7 C) (Rectal)   Resp (!) 24   Ht 5\' 4"  (1.626 m)   Wt 165 lb (74.8 kg)   LMP  (LMP Unknown)   SpO2 100%   BMI 28.32 kg/m   HEMODYNAMICS:    VENTILATOR SETTINGS: Vent Mode: PRVC FiO2 (%):  [70 %-100 %] 70 % Set Rate:  [24 bmp-30 bmp] 30 bmp Vt Set:  [350 mL] 350 mL PEEP:  [5 cmH20] 5 cmH20 Plateau Pressure:  [26 cmH20] 26 cmH20  INTAKE / OUTPUT: No intake/output data recorded.  PHYSICAL EXAMINATION: General: This is an obese female who is ventilated via a 6 tracheostomy tube.   Neuro: She is awake and nodding to questions. Cardiovascular: S1 and S2 are regular without murmur rub or gallop there is no dependent edema Lungs: Inspiratory pressures are in the high 30s with a tidal volume of 380 and a plateau pressure of 28.  There is symmetrically poor air movement bilaterally with a prolonged expiratory phase and no wheezes Abdomen: The abdomen is grossly obese and soft without organomegaly masses or tenderness a feeding tube is in place  LABS:  BMET No results for input(s): NA, K, CL, CO2, BUN, CREATININE, GLUCOSE in the last 168 hours.  Electrolytes No results for input(s):  CALCIUM, MG, PHOS in the last 168 hours.  CBC No results for input(s): WBC, HGB, HCT, PLT in the last 168 hours.  Coag's No results for input(s): APTT, INR in the last 168 hours.  Sepsis Markers Recent Labs  Lab 08/22/17 1626  LATICACIDVEN 1.23    ABG Recent Labs  Lab 08/22/17 1700  PHART 7.166*  PCO2ART 114*  PO2ART 249*    Liver Enzymes No results for input(s): AST, ALT, ALKPHOS, BILITOT, ALBUMIN in the last 168 hours.  Cardiac Enzymes No results for input(s): TROPONINI, PROBNP in the last 168 hours.  Glucose Recent Labs  Lab 08/22/17 1627  GLUCAP 209*    Imaging Dg Chest Portable 1 View  Result Date: 08/22/2017 CLINICAL DATA:  Shortness of breath.  History  of tracheostomy. EXAM: PORTABLE CHEST 1 VIEW COMPARISON:  None. FINDINGS: The heart size and mediastinal contours are within normal limits. Tracheostomy tube present which appears in appropriate position. Right-sided port also present with the catheter tip located in the upper SVC. Lungs show severe emphysematous disease with large bulla formation in the left mid to lower lung. Both lungs are mild to moderately hyperinflated. There is no evidence of pulmonary edema, consolidation, pneumothorax, nodule or pleural fluid. The visualized skeletal structures are unremarkable. IMPRESSION: Severe emphysematous lung disease with bilateral hyperinflation. Electronically Signed   By: Irish Lack M.D.   On: 08/22/2017 16:24     STUDIES:  Chest x-ray shows a right sided port and hyperexpanded lungs with what I suspect are all chronic changes.  There is an area of lucency over the mid left lung field which I suspect represents a large bulla not an anterior pneumothorax.   ANTIBIOTICS: Azithromycin   DISCUSSION:      This is a 47 year old with very advanced lung disease secondary to alpha-1 antitrypsin deficiency who became transiently unresponsive and was found to be hypercarbic after arrival in the department of  emergency medicine.  ASSESSMENT / PLAN:  PULMONARY A: hypercarbic respiratory failure.  I suspect that she had an exacerbation related to plugging or mechanical issues with the ventilator circuit.  She reports being back to baseline at the present time.  I will be obtaining a d-dimer and Dopplers of the lower extremities but I very much doubt a PE with the rapid resolution of her symptomatology.  I am concerned by the appearance of her chest x-ray which I suspect reveals a very large bulla on the left side but would like to image her chest with CT to determine if there is any mechanical intervention we might be able to perform to prove her respiratory function.  I am awaiting the results of the d-dimer before ordering a CT to determine whether or not not a wish to do a contrast study.     ENDOCRINE: I placed her on Lantus and sliding scale insulin for control of her diabetes    Penny Pia, MD Critical Care Medicine [facility] Pager: [phone]  08/22/2017, 6:07 PM

## 2017-08-22 NOTE — Progress Notes (Signed)
RT called due to patient SOB. Upon arrival, patient peak pressures were 53. Attempted to suction with no return. PRN Duo given, inspiratory/expiratory wheezing throughout, left lung much more diminished than right lung. Elink notified. 2 more DUO nebs given back to back. Suctioned patient again obtaining moderate amount of tanish pink tinged thick mucous plug. Rate dropped to 20 from 30 per verbal MD order. Peak pressures still remain in the low 50's. MD given RN orders at this time.

## 2017-08-22 NOTE — Progress Notes (Signed)
eLink Physician-Brief Progress Note Patient Name: Danielle Stevens DOB: 1971/04/11 MRN: [id number]   Date of Service  08/22/2017  HPI/Events of Note  Patient with alpha-1 antitrypsin with chronic trach admitted with hypercarbic resp failure.  Asked to evaluate emergently.  Patient in obvious distress with wheezing.  cxr without acute changes.   Given 3 short acting bronchodilator treatments back to back and suctioned.  Given versed and fentanyl.  RR decreased from 30 to 20 on vent.   eICU Interventions  Patient feeling better. Start fentanyl drip  Ativan prn which she takes at home Continue systemic steroids and treatments Repeat abg in 30 min     Intervention Category Major Interventions: Respiratory failure - evaluation and management  Henry Russel, P 08/22/2017, 10:06 PM

## 2017-08-23 ENCOUNTER — Encounter (HOSPITAL_COMMUNITY): Payer: Self-pay

## 2017-08-23 ENCOUNTER — Inpatient Hospital Stay (HOSPITAL_COMMUNITY): Payer: Medicare Other

## 2017-08-23 DIAGNOSIS — R0602 Shortness of breath: Secondary | ICD-10-CM

## 2017-08-23 DIAGNOSIS — R739 Hyperglycemia, unspecified: Secondary | ICD-10-CM

## 2017-08-23 DIAGNOSIS — R0689 Other abnormalities of breathing: Secondary | ICD-10-CM

## 2017-08-23 DIAGNOSIS — Z930 Tracheostomy status: Secondary | ICD-10-CM

## 2017-08-23 DIAGNOSIS — J9611 Chronic respiratory failure with hypoxia: Secondary | ICD-10-CM

## 2017-08-23 LAB — BLOOD GAS, ARTERIAL
ACID-BASE EXCESS: 12.3 mmol/L — AB (ref 0.0–2.0)
Acid-Base Excess: 12.3 mmol/L — ABNORMAL HIGH (ref 0.0–2.0)
BICARBONATE: 38.6 mmol/L — AB (ref 20.0–28.0)
Bicarbonate: 38.8 mmol/L — ABNORMAL HIGH (ref 20.0–28.0)
DRAWN BY: 313941
Drawn by: 313941
FIO2: 70
FIO2: 50
LHR: 20 {breaths}/min
MECHVT: 380 mL
O2 Saturation: 99.4 %
O2 Saturation: 92.3 %
PATIENT TEMPERATURE: 98.6
PATIENT TEMPERATURE: 98.7
PCO2 ART: 76.7 mmHg — AB (ref 32.0–48.0)
PEEP/CPAP: 5 cmH2O
PEEP: 5 cmH2O
PO2 ART: 67.6 mmHg — AB (ref 83.0–108.0)
RATE: 20 resp/min
VT: 380 mL
pCO2 arterial: 79.2 mmHg (ref 32.0–48.0)
pH, Arterial: 7.323 — ABNORMAL LOW (ref 7.350–7.450)
pH, Arterial: 7.312 — ABNORMAL LOW (ref 7.350–7.450)
pO2, Arterial: 173 mmHg — ABNORMAL HIGH (ref 83.0–108.0)

## 2017-08-23 LAB — BASIC METABOLIC PANEL
Anion gap: 9 (ref 5–15)
BUN: 28 mg/dL — AB (ref 6–20)
CALCIUM: 9.9 mg/dL (ref 8.9–10.3)
CHLORIDE: 91 mmol/L — AB (ref 101–111)
CO2: 38 mmol/L — ABNORMAL HIGH (ref 22–32)
CREATININE: 0.61 mg/dL (ref 0.44–1.00)
GFR calc Af Amer: >60 mL/min (ref >60)
GFR calc non Af Amer: >60 mL/min (ref >60)
Glucose, Bld: 241 mg/dL — ABNORMAL HIGH (ref 65–99)
Potassium: 4.5 mmol/L (ref 3.5–5.1)
SODIUM: 138 mmol/L (ref 135–145)

## 2017-08-23 LAB — CBC
HCT: 33.1 % — ABNORMAL LOW (ref 36.0–46.0)
Hemoglobin: 9.1 g/dL — ABNORMAL LOW (ref 12.0–15.0)
MCH: 23.6 pg — AB (ref 26.0–34.0)
MCHC: 27.5 g/dL — ABNORMAL LOW (ref 30.0–36.0)
MCV: 85.8 fL (ref 78.0–100.0)
PLATELETS: 266 10*3/uL (ref 150–400)
RBC: 3.86 MIL/uL — ABNORMAL LOW (ref 3.87–5.11)
RDW: 19.8 % — AB (ref 11.5–15.5)
WBC: 24.8 10*3/uL — AB (ref 4.0–10.5)

## 2017-08-23 LAB — BLOOD CULTURE ID PANEL (REFLEXED)
Acinetobacter baumannii: NOT DETECTED
CANDIDA GLABRATA: NOT DETECTED
CANDIDA KRUSEI: NOT DETECTED
CANDIDA PARAPSILOSIS: NOT DETECTED
CANDIDA TROPICALIS: NOT DETECTED
Candida albicans: NOT DETECTED
ENTEROBACTER CLOACAE COMPLEX: NOT DETECTED
ESCHERICHIA COLI: NOT DETECTED
Enterobacteriaceae species: NOT DETECTED
Enterococcus species: NOT DETECTED
Haemophilus influenzae: NOT DETECTED
KLEBSIELLA PNEUMONIAE: NOT DETECTED
Klebsiella oxytoca: NOT DETECTED
Listeria monocytogenes: NOT DETECTED
Methicillin resistance: DETECTED — AB
Neisseria meningitidis: NOT DETECTED
PROTEUS SPECIES: NOT DETECTED
Pseudomonas aeruginosa: NOT DETECTED
SERRATIA MARCESCENS: NOT DETECTED
Staphylococcus aureus (BCID): NOT DETECTED
Staphylococcus species: DETECTED — AB
Streptococcus agalactiae: NOT DETECTED
Streptococcus pneumoniae: NOT DETECTED
Streptococcus pyogenes: NOT DETECTED
Streptococcus species: NOT DETECTED

## 2017-08-23 LAB — GLUCOSE, CAPILLARY
GLUCOSE-CAPILLARY: 172 mg/dL — AB (ref 65–99)
GLUCOSE-CAPILLARY: 134 mg/dL — AB (ref 65–99)
Glucose-Capillary: 126 mg/dL — ABNORMAL HIGH (ref 65–99)
Glucose-Capillary: 116 mg/dL — ABNORMAL HIGH (ref 65–99)
Glucose-Capillary: 114 mg/dL — ABNORMAL HIGH (ref 65–99)

## 2017-08-23 LAB — LACTIC ACID, PLASMA: LACTIC ACID, VENOUS: 1.5 mmol/L (ref 0.5–1.9)

## 2017-08-23 LAB — MAGNESIUM: Magnesium: 2.3 mg/dL (ref 1.7–2.4)

## 2017-08-23 LAB — HIV ANTIBODY (ROUTINE TESTING W REFLEX): HIV Screen 4th Generation wRfx: NONREACTIVE

## 2017-08-23 LAB — MRSA PCR SCREENING: MRSA BY PCR: POSITIVE — AB

## 2017-08-23 LAB — PROCALCITONIN: Procalcitonin: 0.98 ng/mL

## 2017-08-23 LAB — PHOSPHORUS: Phosphorus: 4.4 mg/dL (ref 2.5–4.6)

## 2017-08-23 MED ORDER — METHYLPREDNISOLONE SODIUM SUCC 40 MG IJ SOLR
20.0000 mg | Freq: Two times a day (BID) | INTRAMUSCULAR | Status: DC
  Administered 2017-08-23: 20 mg via INTRAVENOUS
  Filled 2017-08-23: qty 1

## 2017-08-23 MED ORDER — VANCOMYCIN HCL 10 G IV SOLR
1250.0000 mg | Freq: Once | INTRAVENOUS | Status: AC
  Administered 2017-08-23: 1250 mg via INTRAVENOUS
  Filled 2017-08-23: qty 1250

## 2017-08-23 MED ORDER — VANCOMYCIN HCL IN DEXTROSE 750-5 MG/150ML-% IV SOLN
750.0000 mg | Freq: Three times a day (TID) | INTRAVENOUS | Status: DC
  Administered 2017-08-24 – 2017-08-25 (×4): 750 mg via INTRAVENOUS
  Filled 2017-08-23 (×6): qty 150

## 2017-08-23 MED ORDER — MUPIROCIN 2 % EX OINT
1.0000 "application " | TOPICAL_OINTMENT | Freq: Two times a day (BID) | CUTANEOUS | Status: AC
  Administered 2017-08-23 – 2017-08-27 (×10): 1 via NASAL
  Filled 2017-08-23 (×2): qty 22

## 2017-08-23 NOTE — Clinical Social Work Note (Signed)
Clinical Social Work Assessment  Patient Details  Name: Danielle Stevens MRN: [id number] Date of Birth: January 12, 1971  Date of referral:  08/23/17               Reason for consult:  Discharge Planning                Permission sought to share information with:  Facility Sport and exercise psychologist, Family Supports Permission granted to share information::  Yes, Verbal Permission Granted  Name::     Designer, television/film set::  SNF's  Relationship::  Daughter/HCPOA  Contact Information:  [phone]  Housing/Transportation Living arrangements for the past 2 months:  [place] of Information:  Patient, Medical Team, Adult Children, Facility Patient Interpreter Needed:  None Criminal Activity/Legal Involvement Pertinent to Current Situation/Hospitalization:  No - Comment as needed Significant Relationships:  Adult Children, Friend Lives with:  Facility Resident Do you feel safe going back to the place where you live?  Yes Need for family participation in patient care:  Yes (Comment)  Care giving concerns:  Patient admitted from [facility].   Social Worker assessment / plan:  CSW met with patient. No supports at bedside. CSW introduced role and explained that discharge planning would be discussed. Patient confirmed she was admitted from [facility] and is agreeable to returning at discharge but prefers to return home. She understands that she cannot return home at this time, especially with a new hospitalization. Per MD, patient can return once bed is available. CSW spoke with her RN at [facility]. They can take her back tomorrow morning once they have received her special bed and medications. Patient asked that [place] notify her daughter. CSW spoke with [employer]. Patient has been at [facility] for about two weeks. She was at a facility called [facility] before that. Patient's daughter voiced concerns with care at [facility] and states she has notified the social worker at the facility. Patient's  daughter stated she spoke with a case Freight forwarder at [facility] and they asked why patient had not been transferred to [facility]. She was told they have a program that helps patients wean. CSW spoke with RNCMs. They are unfamiliar with this particular program. Patient's daughter stated that about one month ago there were plans to transfer patient to [facility] but there was a medication they were not able to accept. CSW has left message for [facility] clinical liaison to see if he can assist with referral. Patient's daughter is agreeable to patient returning to [facility] until another facility can be arranged as long as the patient is agreeable. No further concerns. CSW encouraged patient and her daughter to contact CSW as needed. CSW will continue to follow patient and her daughter for support and facilitate discharge back to SNF likely tomorrow.   Employment status:  Disabled (Comment on whether or not currently receiving Disability) Insurance information:  Medicare, Medicaid In [place] PT Recommendations:  Not assessed at this time Information / Referral to community resources:  [place]  Patient/Family's Response to care:  Patient's daughter is agreeable to patient returning to [facility] as long as patient is agreeable. Patient's daughter supportive and involved in patient's care. Patient and her daughter appreciated social work intervention.  Patient/Family's Understanding of and Emotional Response to Diagnosis, Current Treatment, and Prognosis:  Patient and her daughter have a good understanding of the reason for admission and plan to return to SNF tomorrow. Patient and her daughter appear pleased with hospital care.  Emotional Assessment Appearance:  Appears stated age Attitude/Demeanor/Rapport:  Engaged, Gracious Affect (typically observed):  Accepting, Appropriate, Calm, Pleasant Orientation:  Oriented to Self, Oriented to Place, Oriented to   Time, Oriented to Situation Alcohol / Substance use:  Never Used Psych involvement (Current and /or in the community):  No (Comment)  Discharge Needs  Concerns to be addressed:  Care Coordination Readmission within the last 30 days:  No Current discharge risk:  None Barriers to Discharge:  No SNF bed   Candie Chroman, LCSW 08/23/2017, 3:30 PM

## 2017-08-23 NOTE — Progress Notes (Signed)
Pt not able to tolerate cpt

## 2017-08-23 NOTE — Progress Notes (Signed)
PHARMACY - PHYSICIAN COMMUNICATION CRITICAL VALUE ALERT - BLOOD CULTURE IDENTIFICATION (BCID)  Danielle Stevens is an 47 y.o. female who presented to [facility] on 08/22/2017 with a chief complaint of unresponsiveness.  Assessment:  Suffers from alpha-1 antitrypsin deficiency and ventilator dependent respiratory failure per MD (include suspected source if known)  Name of physician (or Provider) Contacted: Dr. Molli Knock  Current antibiotics: Levaquin  Changes to prescribed antibiotics recommended:  Likely contaminate but stop date put in until 5/1 per MD.   Results for orders placed or performed during the hospital encounter of 08/22/17  Blood Culture ID Panel (Reflexed) (Collected: 08/22/2017  4:06 PM)  Result Value Ref Range   Enterococcus species NOT DETECTED NOT DETECTED   Listeria monocytogenes NOT DETECTED NOT DETECTED   Staphylococcus species DETECTED (A) NOT DETECTED   Staphylococcus aureus NOT DETECTED NOT DETECTED   Methicillin resistance DETECTED (A) NOT DETECTED   Streptococcus species NOT DETECTED NOT DETECTED   Streptococcus agalactiae NOT DETECTED NOT DETECTED   Streptococcus pneumoniae NOT DETECTED NOT DETECTED   Streptococcus pyogenes NOT DETECTED NOT DETECTED   Acinetobacter baumannii NOT DETECTED NOT DETECTED   Enterobacteriaceae species NOT DETECTED NOT DETECTED   Enterobacter cloacae complex NOT DETECTED NOT DETECTED   Escherichia coli NOT DETECTED NOT DETECTED   Klebsiella oxytoca NOT DETECTED NOT DETECTED   Klebsiella pneumoniae NOT DETECTED NOT DETECTED   Proteus species NOT DETECTED NOT DETECTED   Serratia marcescens NOT DETECTED NOT DETECTED   Haemophilus influenzae NOT DETECTED NOT DETECTED   Neisseria meningitidis NOT DETECTED NOT DETECTED   Pseudomonas aeruginosa NOT DETECTED NOT DETECTED   Candida albicans NOT DETECTED NOT DETECTED   Candida glabrata NOT DETECTED NOT DETECTED   Candida krusei NOT DETECTED NOT DETECTED   Candida parapsilosis NOT DETECTED  NOT DETECTED   Candida tropicalis NOT DETECTED NOT DETECTED    Donnella Bi, PharmD PGY1 Acute Care Pharmacy Resident Pager: [phone] 08/23/2017  4:08 PM

## 2017-08-23 NOTE — Progress Notes (Signed)
PULMONARY / CRITICAL CARE MEDICINE   Name: Danielle Stevens MRN: [id number] DOB: 30-Jul-1970    ADMISSION DATE:  08/22/2017  CHIEF COMPLAINT: Unresponsive  HISTORY OF PRESENT ILLNESS:         This is a very unfortunate 47 year old who suffers from alpha-1 antitrypsin deficiency and has ventilator dependent respiratory failure.  At [facility] earlier today she was suddenly unresponsive and EMS was called.  She had minimal tracheal secretions.  She was suctioning given several nebulizer treatments in route to the hospital for her initial blood gas showed a pH of 7.16 a PCO2 of 114 and a PO2 of 249.  She is now awake and interactive and tells me that her breathing seems to be at baseline.  She denies any recent change in cough she denies any chest pain.   SUBJECTIVE:  No events over night, no new complaints  VITAL SIGNS: BP 118/64   Pulse (!) 103   Temp 99.1 F (37.3 C) (Oral)   Resp 17   Ht 5\' 4"  (1.626 m)   Wt 159 lb 2.8 oz (72.2 kg)   LMP  (LMP Unknown)   SpO2 94%   BMI 27.32 kg/m   HEMODYNAMICS:    VENTILATOR SETTINGS: Vent Mode: PRVC FiO2 (%):  [50 %-100 %] 50 % Set Rate:  [20 bmp-30 bmp] 20 bmp Vt Set:  [350 mL-380 mL] 380 mL PEEP:  [5 cmH20] 5 cmH20 Plateau Pressure:  [18 cmH20-26 cmH20] 18 cmH20  INTAKE / OUTPUT: I/O last 3 completed shifts: In: 604.1 [I.V.:259.1; Other:145; IV Piggyback:200] Out: 500 [Urine:500]  PHYSICAL EXAMINATION: General: Obese female with size 6 trach in place, chronic Neuro: Awake and interactive, moving all ext to command Cardiovascular: RRR, Nl S1/S2 and -M/R/G. Lungs: Decreased BS in all lung fields, trach in place Abdomen: Soft, NT, ND and +BS Ext: -edema and -tenderness Skin: intact  LABS:  BMET Recent Labs  Lab 08/22/17 1614 08/23/17 0009  NA 138 138  K 5.3* 4.5  CL 90* 91*  CO2 33* 38*  BUN 35* 28*  CREATININE 0.88 0.61  GLUCOSE 204* 241*   Electrolytes Recent Labs  Lab 08/22/17 1614 08/23/17 0009  CALCIUM  10.4* 9.9  MG 2.6* 2.3  PHOS  --  4.4   CBC Recent Labs  Lab 08/22/17 1614 08/23/17 0009  WBC 44.7* 24.8*  HGB 10.2* 9.1*  HCT 38.1 33.1*  PLT 566* 266   Coag's Recent Labs  Lab 08/22/17 1614  INR 1.07   Sepsis Markers Recent Labs  Lab 08/22/17 1626 08/23/17 0009  LATICACIDVEN 1.23 1.5  PROCALCITON  --  0.98   ABG Recent Labs  Lab 08/22/17 2220 08/23/17 0110 08/23/17 0506  PHART 7.204* 7.323* 7.312*  PCO2ART 103* 76.7* 79.2*  PO2ART 286* 173* 67.6*   Liver Enzymes Recent Labs  Lab 08/22/17 1614  AST 30  ALT 29  ALKPHOS 87  BILITOT 0.6  ALBUMIN 3.3*   Cardiac Enzymes No results for input(s): TROPONINI, PROBNP in the last 168 hours.  Glucose Recent Labs  Lab 08/22/17 1627 08/22/17 2047 08/22/17 2322 08/23/17 0424 08/23/17 0754 08/23/17 1144  GLUCAP 209* 224* 228* 172* 126* 116*   Imaging Dg Chest Port 1 View  Result Date: 08/22/2017 CLINICAL DATA:  Shortness of breath. EXAM: PORTABLE CHEST 1 VIEW COMPARISON:  08/22/2017 FINDINGS: Tracheostomy with tip measuring 9.7 cm above the carina. Right central venous catheter with tip over the upper SVC region. Severe emphysematous changes in the lungs, particularly on the left. Scarring in  both lungs. No airspace disease or consolidation. No blunting of costophrenic angles. No pneumothorax. Mediastinal contours appear intact. Heart size and pulmonary vascularity are normal. IMPRESSION: Severe emphysematous changes in the lungs with scattered fibrosis. No active infiltration. Electronically Signed   By: Burman Nieves M.D.   On: 08/22/2017 22:07   Dg Chest Portable 1 View  Result Date: 08/22/2017 CLINICAL DATA:  Shortness of breath.  History of tracheostomy. EXAM: PORTABLE CHEST 1 VIEW COMPARISON:  None. FINDINGS: The heart size and mediastinal contours are within normal limits. Tracheostomy tube present which appears in appropriate position. Right-sided port also present with the catheter tip located in the  upper SVC. Lungs show severe emphysematous disease with large bulla formation in the left mid to lower lung. Both lungs are mild to moderately hyperinflated. There is no evidence of pulmonary edema, consolidation, pneumothorax, nodule or pleural fluid. The visualized skeletal structures are unremarkable. IMPRESSION: Severe emphysematous lung disease with bilateral hyperinflation. Electronically Signed   By: Irish Lack M.D.   On: 08/22/2017 16:24   STUDIES:  Chest x-ray shows a right sided port and hyperexpanded lungs with what I suspect are all chronic changes.  There is an area of lucency over the mid left lung field which I suspect represents a large bulla not an anterior pneumothorax.  ANTIBIOTICS: Levofloxacin 4/26>>>  I reviewed CXR myself, hyperinflation and trach is in good position  DISCUSSION:      This is a 47 year old with very advanced lung disease secondary to alpha-1 antitrypsin deficiency who became transiently unresponsive and was found to be hypercarbic after arrival in the department of emergency medicine.  Discussed with PCCM-NP and case management  ASSESSMENT / PLAN:  PULMONARY Acute on chronic hypercarbic respiratory failure who presents from [facility] with worsening acidosis that is now improved with deep suction.  - Maintain on full vent support  - Hold off weaning for now  - If bed is available will discharge to [facility]  COPD:  - Decrease solumedrol to 20 mg IV q12 hours and taper down  - BD as ordered  Diabetes:  - Lantus  - CBG  - ISS  Trach status:  - Exchange trach per protocol  - Hold current size and type, no decannulation  Will contact CSW about bed availability  Alyson Reedy, M.D. [facility] Pulmonary/Critical Care Medicine. Pager: [phone]. After hours pager: [phone].  08/23/2017, 1:19 PM

## 2017-08-23 NOTE — Progress Notes (Signed)
PHARMACY - PHYSICIAN COMMUNICATION CRITICAL VALUE ALERT - BLOOD CULTURE IDENTIFICATION (BCID)  Danielle Stevens is an 47 y.o. female who presented to [facility] on 08/22/2017 with a chief complaint of hypercapnic respiratory failure   Assessment:  2/2 Blood cx with coag neg staph & mecA gene on BCID   Name of physician (or Provider) Contacted: E.Link   Current antibiotics: Levaquin 750 mg IV Q 24 hours  Changes to prescribed antibiotics recommended: Start IV Vancomycin Recommendations accepted by provider  Results for orders placed or performed during the hospital encounter of 08/22/17  Blood Culture ID Panel (Reflexed) (Collected: 08/22/2017  4:06 PM)  Result Value Ref Range   Enterococcus species NOT DETECTED NOT DETECTED   Listeria monocytogenes NOT DETECTED NOT DETECTED   Staphylococcus species DETECTED (A) NOT DETECTED   Staphylococcus aureus NOT DETECTED NOT DETECTED   Methicillin resistance DETECTED (A) NOT DETECTED   Streptococcus species NOT DETECTED NOT DETECTED   Streptococcus agalactiae NOT DETECTED NOT DETECTED   Streptococcus pneumoniae NOT DETECTED NOT DETECTED   Streptococcus pyogenes NOT DETECTED NOT DETECTED   Acinetobacter baumannii NOT DETECTED NOT DETECTED   Enterobacteriaceae species NOT DETECTED NOT DETECTED   Enterobacter cloacae complex NOT DETECTED NOT DETECTED   Escherichia coli NOT DETECTED NOT DETECTED   Klebsiella oxytoca NOT DETECTED NOT DETECTED   Klebsiella pneumoniae NOT DETECTED NOT DETECTED   Proteus species NOT DETECTED NOT DETECTED   Serratia marcescens NOT DETECTED NOT DETECTED   Haemophilus influenzae NOT DETECTED NOT DETECTED   Neisseria meningitidis NOT DETECTED NOT DETECTED   Pseudomonas aeruginosa NOT DETECTED NOT DETECTED   Candida albicans NOT DETECTED NOT DETECTED   Candida glabrata NOT DETECTED NOT DETECTED   Candida krusei NOT DETECTED NOT DETECTED   Candida parapsilosis NOT DETECTED NOT DETECTED   Candida tropicalis NOT DETECTED  NOT DETECTED    Vinnie Level, PharmD., BCPS Clinical Pharmacist If after 3:30pm, please call main pharmacy at: [phone]

## 2017-08-23 NOTE — Progress Notes (Signed)
Initial Nutrition Assessment  DOCUMENTATION CODES:  Not applicable  INTERVENTION:  When MD gives approval, Continue patient's baseline TF formula of Osmolite 1.5, but increase to goal of 45 cc/hr and add 30 ml Prostat q24 hrs  Provides: 1720 kcals, 83g Pro, 823 ml fluid  NUTRITION DIAGNOSIS:  Inadequate oral intake related to inability to eat(Chronic resp failure, PEG dependant) as evidenced by NPO status.  GOAL:  Patient will meet greater than or equal to 90% of their needs  MONITOR:  PO intake, Diet advancement, Weight trends, Vent status, Labs, TF tolerance  REASON FOR ASSESSMENT:  Ventilator(Also home tube feeder)    ASSESSMENT:  47 y/o female PMHx Anxiety, tobacco abuse, DM2, HTN, COPD, and  Alpha-1 antitrypsin deficiency w/ associated chronic resp failure, vent dependant x2-3 months. Presents from [facility] due to period of unresponsiveness. Work up for hypercarbic resp failure, thought r/t mucous plugging. Admitted for management.    Patient w/ severe anxiety at baseline. She is seen shaking today on RD arrival, which she relates to her anxiety. She is unable to speak due to trach and RD unable to understand much of her word mouthing.   Per chart, she has only recently become a chronic ventilated patient; she was admitted 1/18-2/5 at [facility] health for COPD exacerbation and was unable to be weaned from ventilator. Trach performed outpatient 2/20. She has been at Acute Care facility for ongoing attempts to wean from ventilator. Unclear when peg placed as appears to have had OG for TF when left vidant  From chart, the patient was also recently admitted to [facility] 4/11-4/12, with D/C tube feeding orders for Osmolite 1.5 @ 40 cc hr.  Patient nods "yes" when asked if this TF regimen sounded to be what she was on at [facility]. She also nodded believed they ran 24 hrs/day. She denies any PO intake since her admission in Jan.  Regarding tolerance, she denies any n/v/c/d with this TF  regimen. Also denies any weight changes that would indicate any inadequacies or over provision of kcals. Per chart, she exhibits weight loss from when admitted to [facility] in Jan, but recently weight appears stable. Will slightly increase her current regimen due to slightly heightened requirements.   Physical Exam: WDL  Labs: Bgs trending down since admit, last 2:126-172,  Meds: Methylprednisolone,  Infusions: Fentanyl, H2RA, IVF, IV abx  Recent Labs  Lab 08/22/17 1614 08/23/17 0009  NA 138 138  K 5.3* 4.5  CL 90* 91*  CO2 33* 38*  BUN 35* 28*  CREATININE 0.88 0.61  CALCIUM 10.4* 9.9  MG 2.6* 2.3  PHOS  --  4.4  GLUCOSE 204* 241*   NUTRITION - FOCUSED PHYSICAL EXAM: No discernible fat/muscle wasting  Diet Order:  No diet orders on file  EDUCATION NEEDS:  No education needs have been identified at this time  Skin: MSAD to buttocks  Last BM:  Unknown  Height:  Ht Readings from Last 1 Encounters:  08/22/17 5\' 4"  (1.626 m)   Weight:  Wt Readings from Last 1 Encounters:  08/23/17 159 lb 2.8 oz (72.2 kg)   Other measurements via Care Everywhere: 4/12: 158 lbs 4.6 oz 1/18: 181 lbs 4.8 oz 1/04: 181 lbs 7 oz  Ideal Body Weight:  54.55 kg  BMI:  Body mass index is 27.32 kg/m.  Estimated Nutritional Needs:  Kcal:  1650-1800 kcals (23-25 kcal/kg bw) Protein:  80-90g Pro (20% kcals) Fluid:  >1.8 L fluid  Christophe Louis RD, LDN, CNSC Clinical Nutrition Available  Tues-Sat via Pager: [phone] 08/23/2017 11:42 AM

## 2017-08-23 NOTE — Progress Notes (Signed)
Pharmacy Antibiotic Note  Danielle Stevens is a 47 y.o. female admitted on 08/22/2017 with bacteremia.  Pharmacy has been consulted for vancomycin dosing.  2/2 BCx with CoNS with methicillin resistance. WBC still very elevated, afebrile in the last 24h.  Plan: Vancomycin 1250mg  IV x1, then 750mg  IV q8h Follow any update to c/s, clinical progression, renal function, level PRN  Height: 5\' 4"  (162.6 cm) Weight: 159 lb 2.8 oz (72.2 kg) IBW/kg (Calculated) : 54.7  Temp (24hrs), Avg:99 F (37.2 C), Min:98.5 F (36.9 C), Max:99.5 F (37.5 C)  Recent Labs  Lab 08/22/17 1614 08/22/17 1626 08/23/17 0009  WBC 44.7*  --  24.8*  CREATININE 0.88  --  0.61  LATICACIDVEN  --  1.23 1.5    Estimated Creatinine Clearance: 83.8 mL/min (by C-G formula based on SCr of 0.61 mg/dL).    Allergies  Allergen Reactions  . Wasp Venom Protein Shortness Of Breath  . Adhesive [Tape] Other (See Comments)    Bruises   . Coconut Oil Hives  . Latex Other (See Comments)    Bruises   . Robitussin Severe Multi-Symp [Phenylephrine-Dm-Gg-Apap] Diarrhea and Other (See Comments)    Allergic, per verbal MAR  . Shellfish-Derived Products Hives and Other (See Comments)    Allergic, per verbal MAR    Levaquin 4/26>>  Vancomycin 4/27>>  4/26 TA: mod GPR, few GNC, rare GNR 4/26 BCx: 2/2 GPC 4/26 BCID: MRSE 4/26 MRSA PCR: pos   Thank you for allowing pharmacy to be a part of this patient's care.  Yerania Chamorro D. Kevyn Wengert, PharmD, BCPS Clinical Pharmacist [phone] 08/23/2017 7:24 PM

## 2017-08-23 NOTE — NC FL2 (Signed)
[place] LEVEL OF CARE SCREENING TOOL     IDENTIFICATION  Patient Name: Danielle Stevens Birthdate: [birth year] Sex: female Admission Date (Current Location): 08/22/2017  [place] and [place] Number:  [facility])   Facility and Address:  [facility] [place], [place] [address], [place], [place] [address]      Provider Number: [id number]  Attending Physician Name and Address:  Sampson Goon, MD  Relative Name and Phone Number:       Current Level of Care: Hospital Recommended Level of Care: [place] Prior Approval Number:    Date Approved/Denied:   PASRR Number: [id number] F expires 11/04/2017  Discharge Plan: SNF    Current Diagnoses: Patient Active Problem List   Diagnosis Date Noted  . Hypercarbia 08/22/2017    Orientation RESPIRATION BLADDER Height & Weight     Self, Time, Situation, Place  Tracheostomy, Vent(Vent/cuffed trach: 50%) Continent, External catheter Weight: 159 lb 2.8 oz (72.2 kg) Height:  '5\' 4"'$  (162.6 cm)  BEHAVIORAL SYMPTOMS/MOOD NEUROLOGICAL BOWEL NUTRITION STATUS  (None) (None) Continent Feeding tube(PEG)  AMBULATORY STATUS COMMUNICATION OF NEEDS Skin     Verbally Other (Comment)(MASD.)                       Personal Care Assistance Level of Assistance              Functional Limitations Info  Sight, Hearing, Speech Sight Info: Adequate Hearing Info: Adequate Speech Info: Impaired(Can mouth words)    SPECIAL CARE FACTORS FREQUENCY  PT (By licensed PT), OT (By licensed OT), Speech therapy     PT Frequency: 5 x week OT Frequency: 5 x week     Speech Therapy Frequency: 5 x week      Contractures Contractures Info: Not present    Additional Factors Info  Code Status, Isolation Precautions Code Status Info: Full Allergies Info: Wasp venom protein, Adhesive (Tape), Coconut Oil, Latex, Robitussin Severe Multi-symp (Phenylephrine-dm-gg-apap), Shellfish-derived Products     Isolation  Precautions Info: Contact     Current Medications (08/23/2017):  This is the current hospital active medication list Current Facility-Administered Medications  Medication Dose Route Frequency Provider Last Rate Last Dose  . 0.9 %  sodium chloride infusion  250 mL Intravenous PRN Sampson Goon, MD 10 mL/hr at 08/23/17 0600    . acetylcysteine (MUCOMYST) 20 % nebulizer / oral solution 4 mL  4 mL Nebulization Q4H Hammonds, Sharyn Blitz, MD   4 mL at 08/23/17 1217  . albuterol (PROVENTIL) (2.5 MG/3ML) 0.083% nebulizer solution 2.5 mg  2.5 mg Nebulization Q2H PRN Mauri Brooklyn, MD   2.5 mg at 08/23/17 0830  . arformoterol (BROVANA) nebulizer solution 15 mcg  15 mcg Nebulization BID Hammonds, Sharyn Blitz, MD   15 mcg at 08/23/17 0844  . budesonide (PULMICORT) nebulizer solution 0.5 mg  0.5 mg Nebulization BID Hammonds, Sharyn Blitz, MD   0.5 mg at 08/23/17 0848  . busPIRone (BUSPAR) tablet 15 mg  15 mg Oral BID Hammonds, Sharyn Blitz, MD   15 mg at 08/23/17 1601  . chlorhexidine gluconate (MEDLINE KIT) (PERIDEX) 0.12 % solution 15 mL  15 mL Mouth Rinse BID Sampson Goon, MD   15 mL at 08/23/17 0757  . Chlorhexidine Gluconate Cloth 2 % PADS 6 each  6 each Topical Daily Sampson Goon, MD   6 each at 08/23/17 [phone]  . clonazePAM (KLONOPIN) disintegrating tablet 1 mg  1 mg Per Tube TID Marolyn Hammock  J, MD   1 mg at 08/23/17 0923  . enoxaparin (LOVENOX) injection 40 mg  40 mg Subcutaneous Q24H Sampson Goon, MD   40 mg at 08/22/17 2206  . famotidine (PEPCID) IVPB 20 mg premix  20 mg Intravenous Q12H Sampson Goon, MD   Stopped at 08/23/17 [phone]  . fentaNYL (SUBLIMAZE) injection 50-100 mcg  50-100 mcg Intravenous Q2H PRN Mauri Brooklyn, MD   50 mcg at 08/23/17 1143  . fentaNYL 2526mg in NS 2574m(1064mml) infusion-PREMIX  0-400 mcg/hr Intravenous Continuous SmiMauri BrooklynD 30 mL/hr at 08/23/17 0922 300 mcg/hr at 08/23/17 0922  . insulin aspart (novoLOG) injection 2-6 Units  2-6 Units Subcutaneous Q4H Hammonds,  KatSharyn BlitzD   2 Units at 08/23/17 0757  . insulin glargine (LANTUS) injection 10 Units  10 Units Subcutaneous QHS GraSampson GoonD   10 Units at 08/22/17 2207  . ipratropium-albuterol (DUONEB) 0.5-2.5 (3) MG/3ML nebulizer solution 3 mL  3 mL Nebulization Q4H Curatolo, Adam, DO   3 mL at 08/23/17 1217  . levofloxacin (LEVAQUIN) IVPB 750 mg  750 mg Intravenous QHS Hammonds, KatSharyn BlitzD   Stopped at 08/23/17 0102  . LORazepam (ATIVAN) injection 2 mg  2 mg Intravenous Q2H PRN SmiMauri BrooklynD   2 mg at 08/22/17 2250  . MEDLINE mouth rinse  15 mL Mouth Rinse 10 times per day GraSampson GoonD   15 mL at 08/23/17 1146  . methylPREDNISolone sodium succinate (SOLU-MEDROL) 40 mg/mL injection 20 mg  20 mg Intravenous Q12H YacRush FarmerD      . montelukast (SINGULAIR) tablet 10 mg  10 mg Oral QHS GraSampson GoonD   10 mg at 08/22/17 2214  . mupirocin ointment (BACTROBAN) 2 % 1 application  1 application Nasal BID GraSampson GoonD   1 application at 04/35/52/[id number] PARoxetine (PAXIL) tablet 30 mg  30 mg Per Tube Daily GraSampson GoonD   30 mg at 08/23/17 [id number] sodium chloride flush (NS) 0.9 % injection 10-40 mL  10-40 mL Intracatheter Q12H GraSampson GoonD   Stopped at 08/23/17 [id number] sodium chloride flush (NS) 0.9 % injection 10-40 mL  10-40 mL Intracatheter PRN GraSampson GoonD         Discharge Medications: Please see discharge summary for a list of discharge medications.  Relevant Imaging Results:  Relevant Lab Results:   Additional Information SS#: [id number]

## 2017-08-23 NOTE — Progress Notes (Signed)
eLink Physician-Brief Progress Note Patient Name: Danielle Stevens DOB: January 09, 1971 MRN: [id number]   Date of Service  08/23/2017  HPI/Events of Note  Notified by pharmacy that patient with 2/2 blood cultures with coag neg staph with meth resistance  eICU Interventions  Start vanc     Intervention Category Major Interventions: Infection - evaluation and management  Henry Russel, P 08/23/2017, 7:14 PM

## 2017-08-23 NOTE — Progress Notes (Signed)
VASCULAR LAB PRELIMINARY  PRELIMINARY  PRELIMINARY  PRELIMINARY  Bilateral lower extremity venous duplex completed.    Preliminary report:  There is no DVT or [name] noted in the bilateral lower extremities.   Whittney Steenson, RVT 08/23/2017, 10:28 AM

## 2017-08-24 DIAGNOSIS — J95851 Ventilator associated pneumonia: Secondary | ICD-10-CM | POA: Diagnosis present

## 2017-08-24 DIAGNOSIS — J9621 Acute and chronic respiratory failure with hypoxia: Secondary | ICD-10-CM | POA: Diagnosis present

## 2017-08-24 DIAGNOSIS — J9622 Acute and chronic respiratory failure with hypercapnia: Secondary | ICD-10-CM

## 2017-08-24 DIAGNOSIS — J441 Chronic obstructive pulmonary disease with (acute) exacerbation: Secondary | ICD-10-CM | POA: Diagnosis present

## 2017-08-24 LAB — PHOSPHORUS
Phosphorus: 3.2 mg/dL (ref 2.5–4.6)
Phosphorus: 3.4 mg/dL (ref 2.5–4.6)

## 2017-08-24 LAB — GLUCOSE, CAPILLARY
Glucose-Capillary: 118 mg/dL — ABNORMAL HIGH (ref 65–99)
Glucose-Capillary: 130 mg/dL — ABNORMAL HIGH (ref 65–99)
Glucose-Capillary: 150 mg/dL — ABNORMAL HIGH (ref 65–99)
Glucose-Capillary: 172 mg/dL — ABNORMAL HIGH (ref 65–99)
Glucose-Capillary: 184 mg/dL — ABNORMAL HIGH (ref 65–99)
Glucose-Capillary: 88 mg/dL (ref 65–99)

## 2017-08-24 LAB — MAGNESIUM
MAGNESIUM: 2.3 mg/dL (ref 1.7–2.4)
Magnesium: 2.3 mg/dL (ref 1.7–2.4)

## 2017-08-24 LAB — PROCALCITONIN: PROCALCITONIN: 0.91 ng/mL

## 2017-08-24 MED ORDER — VITAL HIGH PROTEIN PO LIQD
1000.0000 mL | ORAL | Status: DC
  Administered 2017-08-24: 1000 mL
  Filled 2017-08-24 (×3): qty 1000

## 2017-08-24 MED ORDER — ROPINIROLE HCL 0.5 MG PO TABS
0.5000 mg | ORAL_TABLET | Freq: Every day | ORAL | Status: DC
  Administered 2017-08-24 – 2017-08-28 (×5): 0.5 mg
  Filled 2017-08-24 (×5): qty 1

## 2017-08-24 MED ORDER — PREDNISONE 20 MG PO TABS
40.0000 mg | ORAL_TABLET | Freq: Every day | ORAL | Status: DC
  Administered 2017-08-24 – 2017-08-28 (×5): 40 mg
  Filled 2017-08-24 (×6): qty 2

## 2017-08-24 MED ORDER — INSULIN GLARGINE 100 UNIT/ML ~~LOC~~ SOLN
18.0000 [IU] | Freq: Every day | SUBCUTANEOUS | Status: DC
  Administered 2017-08-24 – 2017-08-25 (×2): 18 [IU] via SUBCUTANEOUS
  Filled 2017-08-24 (×2): qty 0.18

## 2017-08-24 MED ORDER — DOXEPIN HCL 10 MG/ML PO CONC
10.0000 mg | Freq: Every day | ORAL | Status: DC
  Administered 2017-08-24 – 2017-08-25 (×2): 10 mg via ORAL
  Filled 2017-08-24 (×2): qty 1

## 2017-08-24 MED ORDER — ORAL CARE MOUTH RINSE
15.0000 mL | Freq: Four times a day (QID) | OROMUCOSAL | Status: DC
  Administered 2017-08-24 – 2017-09-25 (×125): 15 mL via OROMUCOSAL

## 2017-08-24 MED ORDER — PRO-STAT SUGAR FREE PO LIQD
30.0000 mL | Freq: Two times a day (BID) | ORAL | Status: DC
  Administered 2017-08-24 – 2017-09-03 (×20): 30 mL
  Filled 2017-08-24 (×21): qty 30

## 2017-08-24 MED ORDER — DILTIAZEM 12 MG/ML ORAL SUSPENSION
30.0000 mg | Freq: Three times a day (TID) | ORAL | Status: DC
  Administered 2017-08-24 – 2017-09-02 (×17): 30 mg
  Filled 2017-08-24 (×28): qty 3

## 2017-08-24 MED ORDER — OXYCODONE HCL 5 MG PO TABS
5.0000 mg | ORAL_TABLET | Freq: Four times a day (QID) | ORAL | Status: DC | PRN
  Administered 2017-08-25 – 2017-09-15 (×14): 5 mg via ORAL
  Filled 2017-08-24 (×15): qty 1

## 2017-08-24 MED ORDER — METOPROLOL TARTRATE 25 MG/10 ML ORAL SUSPENSION
50.0000 mg | Freq: Two times a day (BID) | ORAL | Status: DC
  Administered 2017-08-24 – 2017-08-28 (×5): 50 mg
  Filled 2017-08-24 (×5): qty 20

## 2017-08-24 MED ORDER — CHLORHEXIDINE GLUCONATE 0.12% ORAL RINSE (MEDLINE KIT)
15.0000 mL | Freq: Two times a day (BID) | OROMUCOSAL | Status: DC
  Administered 2017-08-24 – 2017-09-24 (×59): 15 mL via OROMUCOSAL

## 2017-08-24 MED ORDER — QUETIAPINE FUMARATE 50 MG PO TABS
50.0000 mg | ORAL_TABLET | Freq: Two times a day (BID) | ORAL | Status: DC
  Administered 2017-08-24 – 2017-09-17 (×49): 50 mg
  Filled 2017-08-24 (×50): qty 1

## 2017-08-24 MED ORDER — SODIUM CHLORIDE 3 % IN NEBU
4.0000 mL | INHALATION_SOLUTION | Freq: Two times a day (BID) | RESPIRATORY_TRACT | Status: AC
  Administered 2017-08-24 – 2017-08-26 (×6): 4 mL via RESPIRATORY_TRACT
  Filled 2017-08-24 (×6): qty 4

## 2017-08-24 NOTE — Progress Notes (Addendum)
CSW contacted [facility] regarding patients disposition planning. Was informed by [place], RN, patient will be able to return to facility on Monday 4/29 if medically stable.   Stacy Gardner, [facility] Emergency Room Clinical Social Worker [phone]

## 2017-08-24 NOTE — Progress Notes (Signed)
Wasted 220 mL Fentanyl in sink with Tenny Craw, RN.

## 2017-08-24 NOTE — Progress Notes (Addendum)
PULMONARY / CRITICAL CARE MEDICINE   Name: Danielle Stevens MRN: [id number] DOB: 1970/06/15    ADMISSION DATE:  08/22/2017  CHIEF COMPLAINT: Unresponsive  HISTORY OF PRESENT ILLNESS:         This is a very unfortunate 47 year old who suffers from alpha-1 antitrypsin deficiency and has ventilator dependent respiratory failure.  At [facility] earlier today she was suddenly unresponsive and EMS was called.  She had minimal tracheal secretions.  She was suctioning given several nebulizer treatments in route to the hospital for her initial blood gas showed a pH of 7.16 a PCO2 of 114 and a PO2 of 249.  She is now awake and interactive and tells me that her breathing seems to be at baseline.  She denies any recent change in cough she denies any chest pain.   SUBJECTIVE:  Feels better   VITAL SIGNS: Blood Pressure 110/60 (BP Location: Right Arm)   Pulse (Abnormal) 119   Temperature 99.2 F (37.3 C) (Oral)   Respiration 19   Height 5\' 4"  (1.626 m)   Weight 159 lb 2.8 oz (72.2 kg)   Last Menstrual Period  (LMP Unknown)   Oxygen Saturation 99%   Body Mass Index 27.32 kg/m   HEMODYNAMICS:    VENTILATOR SETTINGS: Vent Mode: PRVC FiO2 (%):  [40 %-50 %] 50 % Set Rate:  [20 bmp] 20 bmp Vt Set:  [380 mL] 380 mL PEEP:  [5 cmH20] 5 cmH20 Plateau Pressure:  [18 cmH20-34 cmH20] 34 cmH20  INTAKE / OUTPUT:  Intake/Output Summary (Last 24 hours) at 08/24/2017 0950 Last data filed at 08/24/2017 0946 Gross per 24 hour  Intake 1437.5 ml  Output 555 ml  Net 882.5 ml     PHYSICAL EXAMINATION: General: This is a chronically ill-appearing 47 year old female who is ventilator dependent she is tremulous but in no acute distress HEENT: Normocephalic atraumatic XLT tracheostomy unremarkable without significant drainage Pulmonary: Prolonged expiratory phase, expiratory wheeze, mild accessory use on the ventilator however no acute distress Cardiac: Tachycardic regular rate and rhythm no  murmur Abdomen: Soft nontender PEG tube unremarkable positive bowel sounds Extremities: Warm dry no significant edema Neuro: Awake, interactive GU: Clear yellow  LABS:  BMET Recent Labs  Lab 08/22/17 1614 08/23/17 0009  NA 138 138  K 5.3* 4.5  CL 90* 91*  CO2 33* 38*  BUN 35* 28*  CREATININE 0.88 0.61  GLUCOSE 204* 241*   Electrolytes Recent Labs  Lab 08/22/17 1614 08/23/17 0009  CALCIUM 10.4* 9.9  MG 2.6* 2.3  PHOS  --  4.4   CBC Recent Labs  Lab 08/22/17 1614 08/23/17 0009  WBC 44.7* 24.8*  HGB 10.2* 9.1*  HCT 38.1 33.1*  PLT 566* 266   Coag's Recent Labs  Lab 08/22/17 1614  INR 1.07   Sepsis Markers Recent Labs  Lab 08/22/17 1626 08/23/17 0009 08/24/17 0651  LATICACIDVEN 1.23 1.5  --   PROCALCITON  --  0.98 0.91   ABG Recent Labs  Lab 08/22/17 2220 08/23/17 0110 08/23/17 0506  PHART 7.204* 7.323* 7.312*  PCO2ART 103* 76.7* 79.2*  PO2ART 286* 173* 67.6*   Liver Enzymes Recent Labs  Lab 08/22/17 1614  AST 30  ALT 29  ALKPHOS 87  BILITOT 0.6  ALBUMIN 3.3*   Cardiac Enzymes No results for input(s): TROPONINI, PROBNP in the last 168 hours.  Glucose Recent Labs  Lab 08/23/17 1144 08/23/17 1548 08/23/17 2008 08/24/17 0022 08/24/17 0449 08/24/17 0805  GLUCAP 116* 134* 114* 118* 130* 88  Imaging No results found. STUDIES:  Chest x-ray shows a right sided port and hyperexpanded lungs with what I suspect are all chronic changes.  There is an area of lucency over the mid left lung field which I suspect represents a large bulla not an anterior pneumothorax. ECHO 4/28>>>  ANTIBIOTICS: Levofloxacin 4/26>>> Vancomycin 4/27  DISCUSSION:      This is a 47 year old with very advanced lung disease secondary to alpha-1 antitrypsin deficiency who became transiently unresponsive and was found to be hypercarbic after arrival in the department of emergency medicine.  ASSESSMENT / PLAN:   Acute on chronic hypercarbic respiratory  failure who presents from [facility] with worsening acidosis in setting of mucous plugging from purulent bronchitis Plan Discontinue Mucomyst add daily hypertonic saline nebulizers continue full ventilator support Daily assessment for weaning Mobilize Discontinue fentanyl infusion Add metaneb  COPD: With acute exacerbation and purulent bronchitis -Clinically improving Plan Change Solu-Medrol to 40 mg via tube daily and taper over 10 days Continue bronchodilators Day #3 levofloxacin Day #2 vancomycin   Gram-positive cocci bacteremia preliminary methicillin resistance -This is an 2 out of 2 samples Plan Echocardiogram Day #2 vancomycin Follow-up culture Await ID input-->I worry her Port may need to be removed.   Diabetes with hyperglycemia likely exacerbated by steroids Plan Continue sliding-scale insulin dosing Increase basal dosing  Trach status Plan Continue routine tracheostomy care Not currently a candidate for deep cannulation  Chronic anxiety and pain Plan Add back home meds  Simonne Martinet ACNP-BC [facility] Pulmonary/Critical Care Pager # [phone] OR # [phone] if no answer   08/24/2017, 9:41 AM

## 2017-08-25 ENCOUNTER — Other Ambulatory Visit (HOSPITAL_COMMUNITY): Payer: Medicare Other

## 2017-08-25 ENCOUNTER — Encounter (HOSPITAL_COMMUNITY): Payer: Self-pay | Admitting: General Surgery

## 2017-08-25 ENCOUNTER — Inpatient Hospital Stay (HOSPITAL_COMMUNITY): Payer: Medicare Other

## 2017-08-25 DIAGNOSIS — Z931 Gastrostomy status: Secondary | ICD-10-CM

## 2017-08-25 DIAGNOSIS — R0602 Shortness of breath: Secondary | ICD-10-CM

## 2017-08-25 DIAGNOSIS — Z87891 Personal history of nicotine dependence: Secondary | ICD-10-CM

## 2017-08-25 DIAGNOSIS — E8801 Alpha-1-antitrypsin deficiency: Secondary | ICD-10-CM

## 2017-08-25 DIAGNOSIS — Z9911 Dependence on respirator [ventilator] status: Secondary | ICD-10-CM

## 2017-08-25 DIAGNOSIS — B9562 Methicillin resistant Staphylococcus aureus infection as the cause of diseases classified elsewhere: Secondary | ICD-10-CM

## 2017-08-25 DIAGNOSIS — Z95828 Presence of other vascular implants and grafts: Secondary | ICD-10-CM

## 2017-08-25 DIAGNOSIS — R7881 Bacteremia: Secondary | ICD-10-CM

## 2017-08-25 LAB — ECHOCARDIOGRAM COMPLETE
HEIGHTINCHES: 64 in
WEIGHTICAEL: 2684.32 [oz_av]

## 2017-08-25 LAB — VANCOMYCIN, TROUGH: VANCOMYCIN TR: 31 ug/mL — AB (ref 15–20)

## 2017-08-25 LAB — GLUCOSE, CAPILLARY
GLUCOSE-CAPILLARY: 110 mg/dL — AB (ref 65–99)
GLUCOSE-CAPILLARY: 127 mg/dL — AB (ref 65–99)
Glucose-Capillary: 106 mg/dL — ABNORMAL HIGH (ref 65–99)
Glucose-Capillary: 156 mg/dL — ABNORMAL HIGH (ref 65–99)
Glucose-Capillary: 240 mg/dL — ABNORMAL HIGH (ref 65–99)
Glucose-Capillary: 244 mg/dL — ABNORMAL HIGH (ref 65–99)

## 2017-08-25 LAB — CBC
HCT: 31.1 % — ABNORMAL LOW (ref 36.0–46.0)
Hemoglobin: 8.5 g/dL — ABNORMAL LOW (ref 12.0–15.0)
MCH: 23.2 pg — ABNORMAL LOW (ref 26.0–34.0)
MCHC: 27.3 g/dL — ABNORMAL LOW (ref 30.0–36.0)
MCV: 84.7 fL (ref 78.0–100.0)
PLATELETS: 209 10*3/uL (ref 150–400)
RBC: 3.67 MIL/uL — AB (ref 3.87–5.11)
RDW: 19.5 % — ABNORMAL HIGH (ref 11.5–15.5)
WBC: 14 10*3/uL — AB (ref 4.0–10.5)

## 2017-08-25 LAB — CULTURE, RESPIRATORY: CULTURE: NORMAL

## 2017-08-25 LAB — BASIC METABOLIC PANEL
ANION GAP: 7 (ref 5–15)
BUN: 33 mg/dL — ABNORMAL HIGH (ref 6–20)
CO2: 37 mmol/L — ABNORMAL HIGH (ref 22–32)
Calcium: 9.3 mg/dL (ref 8.9–10.3)
Chloride: 93 mmol/L — ABNORMAL LOW (ref 101–111)
Creatinine, Ser: 0.57 mg/dL (ref 0.44–1.00)
GLUCOSE: 106 mg/dL — AB (ref 65–99)
POTASSIUM: 3.9 mmol/L (ref 3.5–5.1)
SODIUM: 137 mmol/L (ref 135–145)

## 2017-08-25 LAB — PHOSPHORUS
PHOSPHORUS: 3.1 mg/dL (ref 2.5–4.6)
PHOSPHORUS: 3.7 mg/dL (ref 2.5–4.6)

## 2017-08-25 LAB — CULTURE, RESPIRATORY W GRAM STAIN

## 2017-08-25 LAB — MAGNESIUM
MAGNESIUM: 2 mg/dL (ref 1.7–2.4)
MAGNESIUM: 2 mg/dL (ref 1.7–2.4)

## 2017-08-25 MED ORDER — FREE WATER
100.0000 mL | Status: DC
  Administered 2017-08-25 – 2017-09-02 (×45): 100 mL

## 2017-08-25 MED ORDER — OSMOLITE 1.5 CAL PO LIQD
1000.0000 mL | ORAL | Status: DC
  Administered 2017-08-25 – 2017-09-02 (×9): 1000 mL
  Filled 2017-08-25 (×15): qty 1000

## 2017-08-25 MED ORDER — ADULT MULTIVITAMIN LIQUID CH
15.0000 mL | Freq: Every day | ORAL | Status: DC
  Administered 2017-08-27 – 2017-09-25 (×29): 15 mL
  Filled 2017-08-25 (×37): qty 15

## 2017-08-25 MED ORDER — POLYETHYLENE GLYCOL 3350 17 G PO PACK
17.0000 g | PACK | Freq: Every day | ORAL | Status: DC
  Administered 2017-08-26 – 2017-09-23 (×11): 17 g
  Filled 2017-08-25 (×15): qty 1

## 2017-08-25 NOTE — Progress Notes (Signed)
CPT not performed at this time due to patient sleeping and resting comfortably. RT will attempt at a later time.

## 2017-08-25 NOTE — Evaluation (Signed)
Physical Therapy Evaluation Patient Details Name: Danielle Stevens MRN: [id number] DOB: June 01, 1970 Today's Date: 08/25/2017   History of Present Illness  Pt is a 47 y.o. female with PMH of Alpha-1-antitrypsin deficiency, Anemia, Anxiety, Asthma, COPD, Diabetes mellitus without complication, Emphysema lung, and HTN.  Per chart, patient was hospitalized from 05-16-17 to 06-03-17 for COPD exacerbation which required intubation.  Tracheostomy done later and discharged to SNF [facility] with a trach.  EMS called by [facility] due to pt being unresponsive.     Clinical Impression  Pt admitted with above diagnosis. Pt currently with functional limitations due to the deficits listed below (see PT Problem List). On eval, pt required +2 min/mod assist bed mobility. She sat EOB x 5 minutes min guard assist. Unable to progress beyond EOB due to pt's anxiety. Pt with c/o difficulty breathing requiring return to supine. VSS. RN notified of pt's concerns. Pt will benefit from skilled PT to increase their independence and safety with mobility to allow discharge to the venue listed below.       Follow Up Recommendations SNF    Equipment Recommendations  None recommended by PT    Recommendations for Other Services       Precautions / Restrictions Precautions Precautions: Fall;Other (comment) Precaution Comments: trach/vent, peg tube, high anxiety      Mobility  Bed Mobility Overal bed mobility: Needs Assistance Bed Mobility: Supine to Sit;Sit to Supine     Supine to sit: +2 for safety/equipment;Min assist Sit to supine: Mod assist;+2 for safety/equipment   General bed mobility comments: Pt sat EOB x 5 minutes. Pt required return to supine due to anxiety (hot, feeling like she couldn't breathe). VSS  Transfers                 General transfer comment: unable  Ambulation/Gait             General Gait Details: unable  Stairs            Wheelchair Mobility    Modified Rankin (Stroke  Patients Only)       Balance Overall balance assessment: Needs assistance Sitting-balance support: Bilateral upper extremity supported;Feet unsupported Sitting balance-Leahy Scale: Fair Sitting balance - Comments: UE support                                     Pertinent Vitals/Pain Pain Assessment: Faces Faces Pain Scale: No hurt    Home Living Family/patient expects to be discharged to:: Skilled nursing facility                 Additional Comments: [facility]    Prior Function           Comments: Unsure of mobility status. Pt has been at [facility] since d/c from hospital in Feb. 2019. Per pt, she has been working with therapy at [facility].      Hand Dominance        Extremity/Trunk Assessment   Upper Extremity Assessment Upper Extremity Assessment: Generalized weakness    Lower Extremity Assessment Lower Extremity Assessment: Generalized weakness    Cervical / Trunk Assessment Cervical / Trunk Assessment: Kyphotic  Communication   Communication: Tracheostomy  Cognition Arousal/Alertness: Awake/alert Behavior During Therapy: Anxious Overall Cognitive Status: Difficult to assess  General Comments: Pt very anxious which limits her ability to participate.       General Comments      Exercises     Assessment/Plan    PT Assessment Patient needs continued PT services  PT Problem List Decreased strength;Decreased mobility;Decreased activity tolerance;Cardiopulmonary status limiting activity;Decreased balance       PT Treatment Interventions Therapeutic activities;Therapeutic exercise;Patient/family education;Balance training;Functional mobility training    PT Goals (Current goals can be found in the Care Plan section)  Acute Rehab PT Goals Patient Stated Goal: not stated PT Goal Formulation: With patient Time For Goal Achievement: 09/08/17 Potential to Achieve Goals: Fair    Frequency  Min 2X/week   Barriers to discharge        Co-evaluation               AM-PAC PT "6 Clicks" Daily Activity  Outcome Measure Difficulty turning over in bed (including adjusting bedclothes, sheets and blankets)?: A Little Difficulty moving from lying on back to sitting on the side of the bed? : A Lot Difficulty sitting down on and standing up from a chair with arms (e.g., wheelchair, bedside commode, etc,.)?: Unable Help needed moving to and from a bed to chair (including a wheelchair)?: A Lot Help needed walking in hospital room?: Total Help needed climbing 3-5 steps with a railing? : Total 6 Click Score: 10    End of Session Equipment Utilized During Treatment: Oxygen Activity Tolerance: Other (comment)(anxiety) Patient left: in bed;with call bell/phone within reach Nurse Communication: Mobility status;Other (comment)(c/o difficulty breathing) PT Visit Diagnosis: Other abnormalities of gait and mobility (R26.89);Muscle weakness (generalized) (M62.81)    Time: 8590-9311 PT Time Calculation (min) (ACUTE ONLY): 16 min   Charges:   PT Evaluation $PT Eval Moderate Complexity: 1 Mod     PT G Codes:        Aida Raider, PT  Office # [phone] Pager [phone]   Ilda Foil 08/25/2017, 12:44 PM

## 2017-08-25 NOTE — Progress Notes (Addendum)
Pharmacy Antibiotic Note  Flornce Record is a 47 y.o. female admitted on 08/22/2017 with bacteremia.  Pharmacy has been consulted for vancomycin dosing.  2/2 BCx with CoNS with methicillin resistance. WBC is trending down at 14, afebrile in the last 24h. Respiratory culture is normal flora.   Vancomycin trough is elevated at 31 on 750mg  IV every 8 hours (this is a true steady state level). SCr is stable at 0.57 but falsely low. Low UOP recorded at 0.3. Net +2.2L.   Plan: Discontinue current Vancomycin. Re-check vancomycin random level in AM for ability to resume  Follow any update to c/s, clinical progression, renal function, level PRN  Height: 5\' 4"  (162.6 cm) Weight: 167 lb 12.3 oz (76.1 kg) IBW/kg (Calculated) : 54.7  Temp (24hrs), Avg:98.2 F (36.8 C), Min:98.1 F (36.7 C), Max:98.3 F (36.8 C)  Recent Labs  Lab 08/22/17 1614 08/22/17 1626 08/23/17 0009 08/25/17 0410 08/25/17 1115  WBC 44.7*  --  24.8* 14.0*  --   CREATININE 0.88  --  0.61 0.57  --   LATICACIDVEN  --  1.23 1.5  --   --   VANCOTROUGH  --   --   --   --  31*    Estimated Creatinine Clearance: 85.9 mL/min (by C-G formula based on SCr of 0.57 mg/dL).    Allergies  Allergen Reactions  . Wasp Venom Protein Shortness Of Breath  . Adhesive [Tape] Other (See Comments)    Bruises   . Coconut Oil Hives  . Latex Other (See Comments)    Bruises   . Robitussin Severe Multi-Symp [Phenylephrine-Dm-Gg-Apap] Diarrhea and Other (See Comments)    Allergic, per verbal MAR  . Shellfish-Derived Products Hives and Other (See Comments)    Allergic, per verbal MAR    Levaquin 4/26>>  Vancomycin 4/27>>  4/26 TA: mod GPR, few GNC, rare GNR 4/26 BCx: 2/2 GPC 4/26 BCID: MRSE 4/26 MRSA PCR: pos   Thank you for allowing pharmacy to be a part of this patient's care.  Link Snuffer, PharmD, BCPS, BCCCP Clinical Pharmacist Clinical phone 08/25/2017 until 3:30PM [phone] After hours, please call #[phone] 08/25/2017 1:24  PM
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
## 2017-08-25 NOTE — Consult Note (Signed)
[facility] for Infectious Disease    Date of Admission:  08/22/2017   Total days of antibiotics: 2 vanco               Reason for Consult: MRSE bacteremia    Referring Provider: [facility]   Assessment: MRSE bacteremia Suspected Port infection Alpha-1 antitrypsin deficiency Chronic vent dependence  Plan: 1. Continue vanco 2. Port out in AM 3. TTE not able to tell if vegetation. Would hold on TEE.  4. Recheck BCx 5. Would aim for 2 weeks of anbx after port out, provided repeat BCx (-).   Thank you so much for this interesting consult,  Active Problems:   Hypercarbia   Acute on chronic respiratory failure with hypoxia and hypercapnia (HCC)   COPD exacerbation (HCC)   VAP (ventilator-associated pneumonia) ([place])   . arformoterol  15 mcg Nebulization BID  . budesonide (PULMICORT) nebulizer solution  0.5 mg Nebulization BID  . busPIRone  15 mg Oral BID  . chlorhexidine gluconate (MEDLINE KIT)  15 mL Mouth Rinse BID  . Chlorhexidine Gluconate Cloth  6 each Topical Daily  . clonazepam  1 mg Per Tube TID  . diltiazem  30 mg Per Tube Q8H  . doxepin  10 mg Oral QHS  . enoxaparin (LOVENOX) injection  40 mg Subcutaneous Q24H  . feeding supplement (OSMOLITE 1.5 CAL)  1,000 mL Per Tube Q24H  . feeding supplement (PRO-STAT SUGAR FREE 64)  30 mL Per Tube BID  . free water  100 mL Per Tube Q4H  . insulin aspart  2-6 Units Subcutaneous Q4H  . insulin glargine  18 Units Subcutaneous QHS  . ipratropium-albuterol  3 mL Nebulization Q4H  . mouth rinse  15 mL Mouth Rinse QID  . metoprolol tartrate  50 mg Per Tube BID  . montelukast  10 mg Oral QHS  . multivitamin  15 mL Per Tube Daily  . mupirocin ointment  1 application Nasal BID  . PARoxetine  30 mg Per Tube Daily  . polyethylene glycol  17 g Per Tube Daily  . predniSONE  40 mg Per Tube Daily  . QUEtiapine  50 mg Per Tube BID  . rOPINIRole  0.5 mg Per Tube QHS  . sodium chloride flush  10-40 mL Intracatheter Q12H  .  sodium chloride HYPERTONIC  4 mL Nebulization Q12H    HPI: Danielle Stevens is a 47 y.o. female with hx of alpha-1 antitrypsin deficiency (on trach, vent dependent) transferred to [facility] On 4-26 from [facility] after worsening SOB and being found unresponsive. WBC 44, afebrile.  By 4-28 became hypotensive and was felt to be in septic shock. Was found to have MRSA bacteremia.  She has a porta-cath, which we have requested to be removed.   Review of Systems: Review of Systems  Unable to perform ROS: Intubated  she feels her breathing is back to baseline.   Past Medical History:  Diagnosis Date  . Alpha-1-antitrypsin deficiency ([place])   . Anemia   . Anxiety   . Asthma   . COPD (chronic obstructive pulmonary disease) ([place])   . Diabetes mellitus without complication ([place])   . Emphysema lung ([place])   . Hypertension     Social History   Tobacco Use  . Smoking status: Former Smoker    Types: Cigarettes  . Smokeless tobacco: Never Used  Substance Use Topics  . Alcohol use: Not Currently  . Drug use: Never    History reviewed.  No pertinent family history.   Medications:  Scheduled: . arformoterol  15 mcg Nebulization BID  . budesonide (PULMICORT) nebulizer solution  0.5 mg Nebulization BID  . busPIRone  15 mg Oral BID  . chlorhexidine gluconate (MEDLINE KIT)  15 mL Mouth Rinse BID  . Chlorhexidine Gluconate Cloth  6 each Topical Daily  . clonazepam  1 mg Per Tube TID  . diltiazem  30 mg Per Tube Q8H  . doxepin  10 mg Oral QHS  . enoxaparin (LOVENOX) injection  40 mg Subcutaneous Q24H  . feeding supplement (OSMOLITE 1.5 CAL)  1,000 mL Per Tube Q24H  . feeding supplement (PRO-STAT SUGAR FREE 64)  30 mL Per Tube BID  . free water  100 mL Per Tube Q4H  . insulin aspart  2-6 Units Subcutaneous Q4H  . insulin glargine  18 Units Subcutaneous QHS  . ipratropium-albuterol  3 mL Nebulization Q4H  . mouth rinse  15 mL Mouth Rinse QID  . metoprolol tartrate  50 mg Per Tube BID  . montelukast   10 mg Oral QHS  . multivitamin  15 mL Per Tube Daily  . mupirocin ointment  1 application Nasal BID  . PARoxetine  30 mg Per Tube Daily  . polyethylene glycol  17 g Per Tube Daily  . predniSONE  40 mg Per Tube Daily  . QUEtiapine  50 mg Per Tube BID  . rOPINIRole  0.5 mg Per Tube QHS  . sodium chloride flush  10-40 mL Intracatheter Q12H  . sodium chloride HYPERTONIC  4 mL Nebulization Q12H    Abtx:  Anti-infectives (From admission, onward)   Start     Dose/Rate Route Frequency Ordered Stop   08/24/17 0400  vancomycin (VANCOCIN) IVPB 750 mg/150 ml premix  Status:  Discontinued     750 mg 150 mL/hr over 60 Minutes Intravenous Every 8 hours 08/23/17 1925 08/25/17 1324   08/23/17 2000  vancomycin (VANCOCIN) 1,250 mg in sodium chloride 0.9 % 250 mL IVPB     1,250 mg 166.7 mL/hr over 90 Minutes Intravenous  Once 08/23/17 1925 08/23/17 2328   08/22/17 2330  levofloxacin (LEVAQUIN) IVPB 750 mg     750 mg 100 mL/hr over 90 Minutes Intravenous Daily at bedtime 08/22/17 2240 08/27/17 2159        OBJECTIVE: Blood pressure 99/69, pulse 96, temperature 99.2 F (37.3 C), temperature source Oral, resp. rate (!) 25, height _0  (1.626 m), weight 76.1 kg (167 lb 12.3 oz), SpO2 95 %.  Physical Exam  Constitutional: She appears ill. She is intubated.  HENT:  Head: Normocephalic and atraumatic.  Eyes: Pupils are equal, round, and reactive to light.  Neck: Normal range of motion. Neck supple.  Trach site clean.   Cardiovascular: Normal rate and regular rhythm.  Pulmonary/Chest: She is intubated. She has decreased breath sounds.  Abdominal: Soft. Bowel sounds are normal. There is no tenderness.  Peg tube site is clean, no d/c.   Musculoskeletal:       Left lower leg: She exhibits no edema.  Chest wall non-tender. Port appears clean.     Lab Results Results for orders placed or performed during the hospital encounter of 08/22/17 (from the past 48 hour(s))  Glucose, capillary     Status:  Abnormal   Collection Time: 08/23/17  8:08 PM  Result Value Ref Range   Glucose-Capillary 114 (H) 65 - 99 mg/dL  Glucose, capillary     Status: Abnormal   Collection Time: 08/24/17 12:22 AM  Result Value Ref Range   Glucose-Capillary 118 (H) 65 - 99 mg/dL  Glucose, capillary     Status: Abnormal   Collection Time: 08/24/17  4:49 AM  Result Value Ref Range   Glucose-Capillary 130 (H) 65 - 99 mg/dL  Procalcitonin     Status: None   Collection Time: 08/24/17  6:51 AM  Result Value Ref Range   Procalcitonin 0.91 ng/mL    Comment:        Interpretation: PCT > 0.5 ng/mL and <= 2 ng/mL: Systemic infection (sepsis) is possible, but other conditions are known to elevate PCT as well. (NOTE)       Sepsis PCT Algorithm           Lower Respiratory Tract                                      Infection PCT Algorithm    ----------------------------     ----------------------------         PCT < 0.25 ng/mL                PCT < 0.10 ng/mL         Strongly encourage             Strongly discourage   discontinuation of antibiotics    initiation of antibiotics    ----------------------------     -----------------------------       PCT 0.25 - 0.50 ng/mL            PCT 0.10 - 0.25 ng/mL               OR       >80% decrease in PCT            Discourage initiation of                                            antibiotics      Encourage discontinuation           of antibiotics    ----------------------------     -----------------------------         PCT >= 0.50 ng/mL              PCT 0.26 - 0.50 ng/mL                AND       <80% decrease in PCT             Encourage initiation of                                             antibiotics       Encourage continuation           of antibiotics    ----------------------------     -----------------------------        PCT >= 0.50 ng/mL                  PCT > 0.50 ng/mL               AND         increase in PCT  Strongly encourage                                       initiation of antibiotics    Strongly encourage escalation           of antibiotics                                     -----------------------------                                           PCT <= 0.25 ng/mL                                                 OR                                        > 80% decrease in PCT                                     Discontinue / Do not initiate                                             antibiotics Performed at [facility], [place] [address], [place], [place] [address]   Magnesium     Status: None   Collection Time: 08/24/17  6:51 AM  Result Value Ref Range   Magnesium 2.3 1.7 - 2.4 mg/dL    Comment: Performed at [facility] [address], [place], [place] [address]  Phosphorus     Status: None   Collection Time: 08/24/17  6:51 AM  Result Value Ref Range   Phosphorus 3.2 2.5 - 4.6 mg/dL    Comment: Performed at [facility] [address] [place], [place] [address]  Glucose, capillary     Status: None   Collection Time: 08/24/17  8:05 AM  Result Value Ref Range   Glucose-Capillary 88 65 - 99 mg/dL  Glucose, capillary     Status: Abnormal   Collection Time: 08/24/17 11:47 AM  Result Value Ref Range   Glucose-Capillary 150 (H) 65 - 99 mg/dL  Glucose, capillary     Status: Abnormal   Collection Time: 08/24/17  3:59 PM  Result Value Ref Range   Glucose-Capillary 184 (H) 65 - 99 mg/dL  Magnesium     Status: None   Collection Time: 08/24/17  4:39 PM  Result Value Ref Range   Magnesium 2.3 1.7 - 2.4 mg/dL    Comment: Performed at [facility], [place] [address] [place],  [address]  Phosphorus     Status: None   Collection Time: 08/24/17  4:39 PM  Result Value Ref Range   Phosphorus 3.4 2.5 - 4.6 mg/dL    Comment: Performed  at [facility], [place] [address] [place], [place] [address]  Glucose, capillary     Status: Abnormal   Collection Time: 08/24/17  8:25 PM  Result  Value Ref Range   Glucose-Capillary 172 (H) 65 - 99 mg/dL  Glucose, capillary     Status: Abnormal   Collection Time: 08/25/17 12:28 AM  Result Value Ref Range   Glucose-Capillary 156 (H) 65 - 99 mg/dL  Magnesium     Status: None   Collection Time: 08/25/17  4:10 AM  Result Value Ref Range   Magnesium 2.0 1.7 - 2.4 mg/dL    Comment: Performed at [facility], [place] [address] [place], [place] [address]  Phosphorus     Status: None   Collection Time: 08/25/17  4:10 AM  Result Value Ref Range   Phosphorus 3.1 2.5 - 4.6 mg/dL    Comment: Performed at [facility] [address] [place], [place] [address]  CBC     Status: Abnormal   Collection Time: 08/25/17  4:10 AM  Result Value Ref Range   WBC 14.0 (H) 4.0 - 10.5 K/uL   RBC 3.67 (L) 3.87 - 5.11 MIL/uL   Hemoglobin 8.5 (L) 12.0 - 15.0 g/dL   HCT 31.1 (L) 36.0 - 46.0 %   MCV 84.7 78.0 - 100.0 fL   MCH 23.2 (L) 26.0 - 34.0 pg   MCHC 27.3 (L) 30.0 - 36.0 g/dL   RDW 19.5 (H) 11.5 - 15.5 %   Platelets 209 150 - 400 K/uL    Comment: Performed at [facility], [place] [address], [place], [place] [address]  Basic metabolic panel     Status: Abnormal   Collection Time: 08/25/17  4:10 AM  Result Value Ref Range   Sodium 137 135 - 145 mmol/L   Potassium 3.9 3.5 - 5.1 mmol/L   Chloride 93 (L) 101 - 111 mmol/L   CO2 37 (H) 22 - 32 mmol/L   Glucose, Bld 106 (H) 65 - 99 mg/dL   BUN 33 (H) 6 - 20 mg/dL   Creatinine, Ser 0.57 0.44 - 1.00 mg/dL   Calcium 9.3 8.9 - 10.3 mg/dL   GFR calc non Af Amer >60 >60 mL/min   GFR calc Af Amer >60 >60 mL/min    Comment: (NOTE) The eGFR has been calculated using the CKD EPI equation. This calculation has not been validated in all clinical situations. eGFR's persistently <60 mL/min signify possible Chronic Kidney Disease.    Anion gap 7 5 - 15    Comment: Performed at [facility] [address], [place], [place] [address]  Glucose, capillary     Status: Abnormal   Collection  Time: 08/25/17  4:19 AM  Result Value Ref Range   Glucose-Capillary 110 (H) 65 - 99 mg/dL  Glucose, capillary     Status: Abnormal   Collection Time: 08/25/17  7:35 AM  Result Value Ref Range   Glucose-Capillary 127 (H) 65 - 99 mg/dL   Comment 1 Notify RN   Vancomycin, trough     Status: Abnormal   Collection Time: 08/25/17 11:15 AM  Result Value Ref Range   Vancomycin Tr 31 (HH) 15 - 20 ug/mL    Comment: CRITICAL RESULT CALLED TO, READ BACK BY AND VERIFIED WITHFredderick Erb [id number] Baker Performed at [facility], [place] [address], [place], [place] [address]   Glucose, capillary     Status: Abnormal  Collection Time: 08/25/17 11:33 AM  Result Value Ref Range   Glucose-Capillary 244 (H) 65 - 99 mg/dL   Comment 1 Notify RN   Glucose, capillary     Status: Abnormal   Collection Time: 08/25/17  3:26 PM  Result Value Ref Range   Glucose-Capillary 106 (H) 65 - 99 mg/dL      Component Value Date/Time   SDES TRACHEAL ASPIRATE 08/22/2017 2316   [place] NONE 08/22/2017 2316   CULT  08/22/2017 2316    Consistent with normal respiratory flora. Performed at [facility], [place] [address] [place], [place] [address]    REPTSTATUS 08/25/2017 FINAL 08/22/2017 2316   No results found. Recent Results (from the past 240 hour(s))  Culture, blood (Routine x 2)     Status: Abnormal (Preliminary result)   Collection Time: 08/22/17  4:06 PM  Result Value Ref Range Status   Specimen Description BLOOD LEFT FOREARM  Final   Special Requests   Final    BOTTLES DRAWN AEROBIC AND ANAEROBIC Blood Culture adequate volume   Culture  Setup Time   Final    GRAM POSITIVE COCCI IN BOTH AEROBIC AND ANAEROBIC BOTTLES CRITICAL RESULT CALLED TO, READ BACK BY AND VERIFIED WITH: T COLBARD,PHARMD AT 1556 08/23/17 BY L BENFIELD    Culture (A)  Final    STAPHYLOCOCCUS SPECIES (COAGULASE NEGATIVE) SUSCEPTIBILITIES TO FOLLOW Performed at [facility], [place] [address], [place], [place]  [address]    Report Status PENDING  Incomplete  Blood Culture ID Panel (Reflexed)     Status: Abnormal   Collection Time: 08/22/17  4:06 PM  Result Value Ref Range Status   Enterococcus species NOT DETECTED NOT DETECTED Final   Listeria monocytogenes NOT DETECTED NOT DETECTED Final   Staphylococcus species DETECTED (A) NOT DETECTED Final    Comment: Methicillin (oxacillin) resistant coagulase negative staphylococcus. Possible blood culture contaminant (unless isolated from more than one blood culture draw or clinical case suggests pathogenicity). No antibiotic treatment is indicated for blood  culture contaminants. CRITICAL RESULT CALLED TO, READ BACK BY AND VERIFIED WITH: T COLBARD,PHARMD AT 1556 08/23/17 BY L BENFIELD    Staphylococcus aureus NOT DETECTED NOT DETECTED Final   Methicillin resistance DETECTED (A) NOT DETECTED Final    Comment: CRITICAL RESULT CALLED TO, READ BACK BY AND VERIFIED WITH: T COLBARD,PHARMD AT 1556 08/23/17 BY L BENFIELD    Streptococcus species NOT DETECTED NOT DETECTED Final   Streptococcus agalactiae NOT DETECTED NOT DETECTED Final   Streptococcus pneumoniae NOT DETECTED NOT DETECTED Final   Streptococcus pyogenes NOT DETECTED NOT DETECTED Final   Acinetobacter baumannii NOT DETECTED NOT DETECTED Final   Enterobacteriaceae species NOT DETECTED NOT DETECTED Final   Enterobacter cloacae complex NOT DETECTED NOT DETECTED Final   Escherichia coli NOT DETECTED NOT DETECTED Final   Klebsiella oxytoca NOT DETECTED NOT DETECTED Final   Klebsiella pneumoniae NOT DETECTED NOT DETECTED Final   Proteus species NOT DETECTED NOT DETECTED Final   Serratia marcescens NOT DETECTED NOT DETECTED Final   Haemophilus influenzae NOT DETECTED NOT DETECTED Final   Neisseria meningitidis NOT DETECTED NOT DETECTED Final   Pseudomonas aeruginosa NOT DETECTED NOT DETECTED Final   Candida albicans NOT DETECTED NOT DETECTED Final   Candida glabrata NOT DETECTED NOT DETECTED Final    Candida krusei NOT DETECTED NOT DETECTED Final   Candida parapsilosis NOT DETECTED NOT DETECTED Final   Candida tropicalis NOT DETECTED NOT DETECTED Final    Comment: Performed at [facility], [place] [address] [place], [place] [address]  MRSA PCR Screening     Status: Abnormal   Collection Time: 08/22/17  8:45 PM  Result Value Ref Range Status   MRSA by PCR POSITIVE (A) NEGATIVE Final    Comment:        The GeneXpert MRSA Assay (FDA approved for NASAL specimens only), is one component of a comprehensive MRSA colonization surveillance program. It is not intended to diagnose MRSA infection nor to guide or monitor treatment for MRSA infections. RESULT CALLED TO, READ BACK BY AND VERIFIED WITHBishop Limbo RN [id number] 08/23/17 A BROWNING Performed at [facility], [place] [address], [place], [place] [address]   Culture, blood (Routine x 2)     Status: Abnormal (Preliminary result)   Collection Time: 08/22/17  9:22 PM  Result Value Ref Range Status   Specimen Description BLOOD RIGHT HAND  Final   Special Requests   Final    BOTTLES DRAWN AEROBIC AND ANAEROBIC Blood Culture adequate volume   Culture  Setup Time   Final    GRAM POSITIVE COCCI IN CLUSTERS IN BOTH AEROBIC AND ANAEROBIC BOTTLES CRITICAL RESULT CALLED TO, READ BACK BY AND VERIFIED WITH: B MANCHERIL,PHARMD AT [place] 08/23/17 BY L BENFIELD Performed at [facility], [place] [address], [place], [place] [address]    Culture STAPHYLOCOCCUS SPECIES (COAGULASE NEGATIVE) (A)  Final   Report Status PENDING  Incomplete  Culture, respiratory (tracheal aspirate)     Status: None   Collection Time: 08/22/17 11:16 PM  Result Value Ref Range Status   Specimen Description TRACHEAL ASPIRATE  Final   Special Requests NONE  Final   Gram Stain   Final    ABUNDANT WBC PRESENT, PREDOMINANTLY PMN RARE SQUAMOUS EPITHELIAL CELLS PRESENT MODERATE GRAM POSITIVE RODS FEW GRAM NEGATIVE COCCI IN PAIRS RARE GRAM NEGATIVE RODS    Culture    Final    Consistent with normal respiratory flora. Performed at [facility], [place] [address] [place], [place] [address]    Report Status 08/25/2017 FINAL  Final    Microbiology: Recent Results (from the past 240 hour(s))  Culture, blood (Routine x 2)     Status: Abnormal (Preliminary result)   Collection Time: 08/22/17  4:06 PM  Result Value Ref Range Status   Specimen Description BLOOD LEFT FOREARM  Final   Special Requests   Final    BOTTLES DRAWN AEROBIC AND ANAEROBIC Blood Culture adequate volume   Culture  Setup Time   Final    GRAM POSITIVE COCCI IN BOTH AEROBIC AND ANAEROBIC BOTTLES CRITICAL RESULT CALLED TO, READ BACK BY AND VERIFIED WITH: T COLBARD,PHARMD AT 1556 08/23/17 BY L BENFIELD    Culture (A)  Final    STAPHYLOCOCCUS SPECIES (COAGULASE NEGATIVE) SUSCEPTIBILITIES TO FOLLOW Performed at [facility], [place] [address] [place],  [address]    Report Status PENDING  Incomplete  Blood Culture ID Panel (Reflexed)     Status: Abnormal   Collection Time: 08/22/17  4:06 PM  Result Value Ref Range Status   Enterococcus species NOT DETECTED NOT DETECTED Final   Listeria monocytogenes NOT DETECTED NOT DETECTED Final   Staphylococcus species DETECTED (A) NOT DETECTED Final    Comment: Methicillin (oxacillin) resistant coagulase negative staphylococcus. Possible blood culture contaminant (unless isolated from more than one blood culture draw or clinical case suggests pathogenicity). No antibiotic treatment is indicated for blood  culture contaminants. CRITICAL RESULT CALLED TO, READ BACK  BY AND VERIFIED WITH: T COLBARD,PHARMD AT 1556 08/23/17 BY L BENFIELD    Staphylococcus aureus NOT DETECTED NOT DETECTED Final   Methicillin resistance DETECTED (A) NOT DETECTED Final    Comment: CRITICAL RESULT CALLED TO, READ BACK BY AND VERIFIED WITH: T COLBARD,PHARMD AT 1556 08/23/17 BY L BENFIELD    Streptococcus species NOT DETECTED NOT DETECTED Final   Streptococcus  agalactiae NOT DETECTED NOT DETECTED Final   Streptococcus pneumoniae NOT DETECTED NOT DETECTED Final   Streptococcus pyogenes NOT DETECTED NOT DETECTED Final   Acinetobacter baumannii NOT DETECTED NOT DETECTED Final   Enterobacteriaceae species NOT DETECTED NOT DETECTED Final   Enterobacter cloacae complex NOT DETECTED NOT DETECTED Final   Escherichia coli NOT DETECTED NOT DETECTED Final   Klebsiella oxytoca NOT DETECTED NOT DETECTED Final   Klebsiella pneumoniae NOT DETECTED NOT DETECTED Final   Proteus species NOT DETECTED NOT DETECTED Final   Serratia marcescens NOT DETECTED NOT DETECTED Final   Haemophilus influenzae NOT DETECTED NOT DETECTED Final   Neisseria meningitidis NOT DETECTED NOT DETECTED Final   Pseudomonas aeruginosa NOT DETECTED NOT DETECTED Final   Candida albicans NOT DETECTED NOT DETECTED Final   Candida glabrata NOT DETECTED NOT DETECTED Final   Candida krusei NOT DETECTED NOT DETECTED Final   Candida parapsilosis NOT DETECTED NOT DETECTED Final   Candida tropicalis NOT DETECTED NOT DETECTED Final    Comment: Performed at [facility], [place]. [address] [place], [place] [address]  MRSA PCR Screening     Status: Abnormal   Collection Time: 08/22/17  8:45 PM  Result Value Ref Range Status   MRSA by PCR POSITIVE (A) NEGATIVE Final    Comment:        The GeneXpert MRSA Assay (FDA approved for NASAL specimens only), is one component of a comprehensive MRSA colonization surveillance program. It is not intended to diagnose MRSA infection nor to guide or monitor treatment for MRSA infections. RESULT CALLED TO, READ BACK BY AND VERIFIED WITHBishop Limbo RN [id number] 08/23/17 A BROWNING Performed at [facility], [place] [address], [place], [place] [address]   Culture, blood (Routine x 2)     Status: Abnormal (Preliminary result)   Collection Time: 08/22/17  9:22 PM  Result Value Ref Range Status   Specimen Description BLOOD RIGHT HAND  Final   Special Requests    Final    BOTTLES DRAWN AEROBIC AND ANAEROBIC Blood Culture adequate volume   Culture  Setup Time   Final    GRAM POSITIVE COCCI IN CLUSTERS IN BOTH AEROBIC AND ANAEROBIC BOTTLES CRITICAL RESULT CALLED TO, READ BACK BY AND VERIFIED WITH: B MANCHERIL,PHARMD AT [place] 08/23/17 BY L BENFIELD Performed at [facility], [place] [address], [place], [place] [address]    Culture STAPHYLOCOCCUS SPECIES (COAGULASE NEGATIVE) (A)  Final   Report Status PENDING  Incomplete  Culture, respiratory (tracheal aspirate)     Status: None   Collection Time: 08/22/17 11:16 PM  Result Value Ref Range Status   Specimen Description TRACHEAL ASPIRATE  Final   Special Requests NONE  Final   Gram Stain   Final    ABUNDANT WBC PRESENT, PREDOMINANTLY PMN RARE SQUAMOUS EPITHELIAL CELLS PRESENT MODERATE GRAM POSITIVE RODS FEW GRAM NEGATIVE COCCI IN PAIRS RARE GRAM NEGATIVE RODS    Culture   Final    Consistent with normal respiratory flora. Performed at [facility], [place] [address], [place], [place] [address]    Report Status 08/25/2017  FINAL  Final    Radiographs and labs were personally reviewed by me.   Bobby Rumpf, MD [facility] [phone] 08/25/2017, 4:32 PM

## 2017-08-25 NOTE — Plan of Care (Signed)
Continues antibiotics. Remains on Vent via trach. Baseline anxiety, interferes with daily function.

## 2017-08-25 NOTE — Consult Note (Signed)
Danielle Stevens [birth year]  [id number].    Requesting MD: Dr. Marolyn Stevens Chief Complaint/Reason for Consult: bacteremia, PAC removal  HPI:  This is a 47 yo Danielle Stevens female with a history of alpha-1-antitrypsin deficiency who has chronic ventilator dependent respiratory failure.  She has been trached and PEG tube placed for nutritional support.  Her daughter, Danielle Stevens, states that she has had a PAC for many years, likely placed in [facility], [facility]le [facility] for poor venous access.  The patient currently resides at [facility] for her care.  She was brought in secondary to hypercarbia.  This has been corrected, but now she has been found to have MRSA bacteremia.  ID has seen the patient and request a PAC removal secondary to this infection.  The patient denies any pain over this site.    ROS: ROS : unable to fully obtain as she is on the ventilator and unable to phonate.  History reviewed. No pertinent family history.  Past Medical History:  Diagnosis Date  . Alpha-1-antitrypsin deficiency ([place])   . Anemia   . Anxiety   . Asthma   . COPD (chronic obstructive pulmonary disease) ([place])   . Diabetes mellitus without complication ([place])   . Emphysema lung ([place])   . Hypertension     Past Surgical History:  Procedure Laterality Date  . PEG TUBE PLACEMENT    . PORTACATH PLACEMENT Right   . TRACHEOSTOMY      Social History:  reports that she has quit smoking. Her smoking use included cigarettes. She has never used smokeless tobacco. She reports that she drank alcohol. She reports that she does not use drugs.  Allergies:  Allergies  Allergen Reactions  . Wasp Venom Protein Shortness Of Breath  . Adhesive [Tape] Other (See Comments)    Bruises   . Coconut Oil Hives  . Latex Other (See Comments)    Bruises   . Robitussin Severe Multi-Symp [Phenylephrine-Dm-Gg-Apap] Diarrhea and Other (See Comments)    Allergic, per verbal MAR  . Shellfish-Derived Products Hives and Other (See Comments)   Allergic, per verbal MAR    Medications Prior to Admission  Medication Sig Dispense Refill  . acetaZOLAMIDE (DIAMOX) 250 MG tablet Take 250 mg by mouth.    . busPIRone (BUSPAR) 15 MG tablet Take 15 mg by mouth 2 (two) times daily.    . calcium carbonate (TUMS - DOSED IN MG ELEMENTAL CALCIUM) 500 MG chewable tablet Place 1 tablet into feeding tube every 8 (eight) hours as needed for indigestion or heartburn.    . cetirizine (ZYRTEC) 10 MG tablet Place 10 mg into feeding tube at bedtime.    . clonazePAM (KLONOPIN) 1 MG tablet Place 1 mg into feeding tube 3 (three) times daily.    Marland Kitchen diltiazem (CARDIZEM) 30 MG tablet Take 30 mg by mouth every 8 (eight) hours.    Marland Kitchen doxepin (SINEQUAN) 10 MG capsule Place 10 mg into feeding tube at bedtime as needed (for insomnia).    . enoxaparin (LOVENOX) 40 MG/0.4ML injection Inject 40 mg into the skin daily.    . furosemide (LASIX) 40 MG tablet Place 40 mg into feeding tube daily.    . Heparin Lock Flush (HEP-LOCK IV) Inject 500 Units into the vein every 12 (twelve) hours. "FOR PATENCY"    . insulin aspart (NOVOLOG) 100 unit/mL injection Inject 3-13 Units into the skin See admin instructions. Inject 3-13 units into the skin three times a day before meals, per sliding scale: BGL 151-200 = 3  units; 201-250 = 5 units; 251-300 = 7 units; 301-350 = 9 units; 351-400 = 11 units; 401-450 = 13 units and call the MD    . insulin detemir (LEVEMIR) 100 unit/ml SOLN Inject 18 Units into the skin at bedtime.    Marland Kitchen LORazepam (ATIVAN) 2 MG/ML concentrated solution Place 0.5 mg into feeding tube every 12 (twelve) hours as needed for anxiety.    . metoprolol tartrate (LOPRESSOR) 50 MG tablet Place 50 mg into feeding tube 2 times daily at 12 noon and 4 pm.    . Metoprolol Tartrate (METOPROLOL TARTARATE 1 MG/ML SYRINGE, 5ML,) Inject 5 mg into the vein every 4 (four) hours as needed (for hypertension).    . montelukast (SINGULAIR) 10 MG tablet Place 10 mg into feeding tube at bedtime.      Marland Kitchen omeprazole (PRILOSEC) 40 MG capsule 40 mg per tube every morning    . ondansetron (ZOFRAN) 4 MG tablet Place 4 mg into feeding tube every 4 (four) hours as needed for nausea or vomiting.    Marland Kitchen oxyCODONE (OXY IR/ROXICODONE) 5 MG immediate release tablet Take 5 mg by mouth every 8 (eight) hours as needed for severe pain.    Marland Kitchen PARoxetine (PAXIL) 30 MG tablet Place 30 mg into feeding tube daily.    . potassium chloride 20 MEQ/15ML (10%) SOLN Place 20 mEq into feeding tube daily.    Marland Kitchen PRESCRIPTION MEDICATION Hydralazine 20 mg/ml: 0.5 ml per tube every four hours as needed for hypertension    . QUEtiapine (SEROQUEL) 50 MG tablet Place 50 mg into feeding tube 2 (two) times daily.    Marland Kitchen rOPINIRole (REQUIP) 0.5 MG tablet Place 0.5 mg into feeding tube at bedtime.       Physical Exam: Blood pressure 109/76, pulse 96, temperature 99.2 F (37.3 C), temperature source Oral, resp. rate (!) 21, height '5\' 4"'$  (1.626 m), weight 76.1 kg (167 lb 12.3 oz), SpO2 95 %. General: chronically ill-appearing Danielle Stevens female who is laying in bed in NAD HEENT: head is normocephalic, atraumatic.  Sclera are noninjected.  PERRL.  Ears and nose without any masses or lesions.  Mouth is pink and moist.  Trach in place midline, attached to vent Heart: regular, rate, and rhythm.  Normal s1,s2. No obvious murmurs, gallops, or rubs noted.  Palpable radial and pedal pulses bilaterally Lungs/chest: CTAB, but poor air movement noted, on vent.  No rhonchi noted.  PAC in place in RUC, appears to be subclavian.  No erythema, tenderness, or evidence of fluctuance. Abd: soft, NT, ND, +BS, no masses, hernias, or organomegaly.  PEG tube in place. MS: all 4 extremities are symmetrical with no cyanosis, clubbing, or edema, but atrophied from immobile status. Skin: warm and dry with no masses, lesions, or rashes Psych: Alert.  Seems oriented, but unable to fully assess as she is unable to talk.   Results for orders placed or performed during  the hospital encounter of 08/22/17 (from the past 48 hour(s))  Glucose, capillary     Status: Abnormal   Collection Time: 08/23/17  8:08 PM  Result Value Ref Range   Glucose-Capillary 114 (H) 65 - 99 mg/dL  Glucose, capillary     Status: Abnormal   Collection Time: 08/24/17 12:22 AM  Result Value Ref Range   Glucose-Capillary 118 (H) 65 - 99 mg/dL  Glucose, capillary     Status: Abnormal   Collection Time: 08/24/17  4:49 AM  Result Value Ref Range   Glucose-Capillary 130 (H) 65 - 99  mg/dL  Procalcitonin     Status: None   Collection Time: 08/24/17  6:51 AM  Result Value Ref Range   Procalcitonin 0.91 ng/mL    Comment:        Interpretation: PCT > 0.5 ng/mL and <= 2 ng/mL: Systemic infection (sepsis) is possible, but other conditions are known to elevate PCT as well. (NOTE)       Sepsis PCT Algorithm           Lower Respiratory Tract                                      Infection PCT Algorithm    ----------------------------     ----------------------------         PCT < 0.25 ng/mL                PCT < 0.10 ng/mL         Strongly encourage             Strongly discourage   discontinuation of antibiotics    initiation of antibiotics    ----------------------------     -----------------------------       PCT 0.25 - 0.50 ng/mL            PCT 0.10 - 0.25 ng/mL               OR       >80% decrease in PCT            Discourage initiation of                                            antibiotics      Encourage discontinuation           of antibiotics    ----------------------------     -----------------------------         PCT >= 0.50 ng/mL              PCT 0.26 - 0.50 ng/mL                AND       <80% decrease in PCT             Encourage initiation of                                             antibiotics       Encourage continuation           of antibiotics    ----------------------------     -----------------------------        PCT >= 0.50 ng/mL                  PCT >  0.50 ng/mL               AND         increase in PCT                  Strongly encourage  initiation of antibiotics    Strongly encourage escalation           of antibiotics                                     -----------------------------                                           PCT <= 0.25 ng/mL                                                 OR                                        > 80% decrease in PCT                                     Discontinue / Do not initiate                                             antibiotics Performed at [facility], [address], [place], [place] [address]   Magnesium     Status: None   Collection Time: 08/24/17  6:51 AM  Result Value Ref Range   Magnesium 2.3 1.7 - 2.4 mg/dL    Comment: Performed at [facility] [address], [place], [place] [address]  Phosphorus     Status: None   Collection Time: 08/24/17  6:51 AM  Result Value Ref Range   Phosphorus 3.2 2.5 - 4.6 mg/dL    Comment: Performed at [facility] [address] [place], [place] [address]  Glucose, capillary     Status: None   Collection Time: 08/24/17  8:05 AM  Result Value Ref Range   Glucose-Capillary 88 65 - 99 mg/dL  Glucose, capillary     Status: Abnormal   Collection Time: 08/24/17 11:47 AM  Result Value Ref Range   Glucose-Capillary 150 (H) 65 - 99 mg/dL  Glucose, capillary     Status: Abnormal   Collection Time: 08/24/17  3:59 PM  Result Value Ref Range   Glucose-Capillary 184 (H) 65 - 99 mg/dL  Magnesium     Status: None   Collection Time: 08/24/17  4:39 PM  Result Value Ref Range   Magnesium 2.3 1.7 - 2.4 mg/dL    Comment: Performed at [facility], [place] [address] [place],  [address]  Phosphorus     Status: None   Collection Time: 08/24/17  4:39 PM  Result Value Ref Range   Phosphorus 3.4 2.5 - 4.6 mg/dL    Comment: Performed at [facility], [place] [address], [place], Danielle Stevens  [address]  Glucose, capillary     Status: Abnormal   Collection Time: 08/24/17  8:25 PM  Result Value Ref Range   Glucose-Capillary 172 (H)  65 - 99 mg/dL  Glucose, capillary     Status: Abnormal   Collection Time: 08/25/17 12:28 AM  Result Value Ref Range   Glucose-Capillary 156 (H) 65 - 99 mg/dL  Magnesium     Status: None   Collection Time: 08/25/17  4:10 AM  Result Value Ref Range   Magnesium 2.0 1.7 - 2.4 mg/dL    Comment: Performed at [facility], [place] [address], [place], [place] [address]  Phosphorus     Status: None   Collection Time: 08/25/17  4:10 AM  Result Value Ref Range   Phosphorus 3.1 2.5 - 4.6 mg/dL    Comment: Performed at [facility] [address], [place], Danielle Stevens [address]  CBC     Status: Abnormal   Collection Time: 08/25/17  4:10 AM  Result Value Ref Range   WBC 14.0 (H) 4.0 - 10.5 K/uL   RBC 3.67 (L) 3.87 - 5.11 MIL/uL   Hemoglobin 8.5 (L) 12.0 - 15.0 g/dL   HCT 31.1 (L) 36.0 - 46.0 %   MCV 84.7 78.0 - 100.0 fL   MCH 23.2 (L) 26.0 - 34.0 pg   MCHC 27.3 (L) 30.0 - 36.0 g/dL   RDW 19.5 (H) 11.5 - 15.5 %   Platelets 209 150 - 400 K/uL    Comment: Performed at [facility], [place] [address], [place], [place] [address]  Basic metabolic panel     Status: Abnormal   Collection Time: 08/25/17  4:10 AM  Result Value Ref Range   Sodium 137 135 - 145 mmol/L   Potassium 3.9 3.5 - 5.1 mmol/L   Chloride 93 (L) 101 - 111 mmol/L   CO2 37 (H) 22 - 32 mmol/L   Glucose, Bld 106 (H) 65 - 99 mg/dL   BUN 33 (H) 6 - 20 mg/dL   Creatinine, Ser 0.57 0.44 - 1.00 mg/dL   Calcium 9.3 8.9 - 10.3 mg/dL   GFR calc non Af Amer >60 >60 mL/min   GFR calc Af Amer >60 >60 mL/min    Comment: (NOTE) The eGFR has been calculated using the CKD EPI equation. This calculation has not been validated in all clinical situations. eGFR's persistently <60 mL/min signify possible Chronic Kidney Disease.    Anion gap 7 5 - 15    Comment: Performed at [facility] [address] [place], [place] Stevens [address]  Glucose, capillary     Status: Abnormal   Collection Time: 08/25/17  4:19 AM  Result Value Ref Range   Glucose-Capillary 110 (H) 65 - 99 mg/dL  Glucose, capillary     Status: Abnormal   Collection Time: 08/25/17  7:35 AM  Result Value Ref Range   Glucose-Capillary 127 (H) 65 - 99 mg/dL   Comment 1 Notify RN   Vancomycin, trough     Status: Abnormal   Collection Time: 08/25/17 11:15 AM  Result Value Ref Range   Vancomycin Tr 31 (HH) 15 - 20 ug/mL    Comment: CRITICAL RESULT CALLED TO, READ BACK BY AND VERIFIED WITHFredderick Erb [address] [place] Performed at [facility], [place] [address], [place], [place] [address]   Glucose, capillary     Status: Abnormal   Collection Time: 08/25/17 11:33 AM  Result Value Ref Range   Glucose-Capillary 244 (H) 65 - 99 mg/dL   Comment 1 Notify RN   Glucose, capillary     Status: Abnormal   Collection Time: 08/25/17  3:26 PM  Result Value Ref Range   Glucose-Capillary 106 (H) 65 - 99 mg/dL   No results found.    Assessment/Plan MRSA bacteremia, PAC in place The patient has been found to have MRSA bacteremia.  Due to possible infectious source, her PAC has been asked to be removed.  The drum itself does not appear to be infected currently, but truly difficult to tell as the catheter may be.  Nonetheless, I have discussed removal with the patient who is agreeable as well as her daughter, Danielle Stevens, over the phone who is her HCPOA.  She will be NPO (hold TFs) after midnight tonight with preparation for possible OR tomorrow.  Discussed the possibility that her skin may be left open is overt infection is noted.  I also discussed with the primary service the need for further IV access prior to removal since this appears to be her only access.     VDRF, secondary to alpha -1-antitrypsin deficiency, COPD Per medical service, on prednisone DM - per medicine  FEN - hold TFs p MN VTE - hold Lovenox on 4/30 for  OR ID - [place], [facility] Surgery 08/25/2017, 4:06 PM Pager: [phone]

## 2017-08-25 NOTE — Progress Notes (Signed)
  Echocardiogram 2D Echocardiogram has been performed.  Extremely difficult study due to respiratory, patient is on vent and drain/tube in subcostal views. Patient in upright position.   Osher Oettinger L Androw 08/25/2017, 3:19 PM

## 2017-08-25 NOTE — Progress Notes (Signed)
Chest PT not done at this time. Patient is excessively anxious at this time. RN aware and at bedside.

## 2017-08-25 NOTE — Progress Notes (Signed)
Nutrition Follow-up  DOCUMENTATION CODES:   Not applicable  INTERVENTION:  - Will order Osmolite 1.5 @ 40 mL and continue 30 mL Prostat BID. This regimen will provide 1640 kcal, 90 grams of protein, and 732 mL free water.  - Will order 100 mL free water every 4 hours (600 mL free water/day).   NUTRITION DIAGNOSIS:   Inadequate oral intake related to inability to eat(Chronic resp failure, PEG dependant) as evidenced by NPO status. -ongoing  GOAL:   Patient will meet greater than or equal to 90% of their needs -unmet with current TF regimen  MONITOR:   Vent status, TF tolerance, Weight trends, Labs  REASON FOR ASSESSMENT:   Consult Enteral/tube feeding initiation and management  ASSESSMENT:   47 year old who suffers from alpha-1 antitrypsin deficiency and has ventilator dependent respiratory failure.  At [facility] earlier today she was suddenly unresponsive and EMS was called.  Weight +3.9 kg from admission on 4/26. Pt remains on the vent (chronic vent dependency) via trach and PEG in place. Pt currently receiving Vital High Protein @ 40 mL/hr with 30 mL Prostat BID and 10 mL free water every 4 hours. This regimen is providing 1160 kcal, 114 grams of protein, and 862 mL free water. Will adjust TF regimen as outlined above.  Pt awake on the vent and able to nod/shake head and mouth words to communicate. She indicates that she is not experiencing any abdominal pain or nausea at this time.   Per PCCM NP note this afternoon: acute on chronic hypercarbic respiratory failure with worsening acidosis with mucous plugging from bronchitis, bacteremia, DM with hyperglycemia likely exacerbated by steroids.   RD note from 4/27 reviewed which indicated belief that at [facility] pt was receiving Osmolite 1.5 @ 40 mL/hr over 24 hours via PEG. This regimen would provide 1440 kcal, 60 grams of protein, and 732 mL free water. Needs increased at this time d/t acute illness but will monitor weight  trends and CBGs closely and re-adjust as needed.   Patient is currently intubated on ventilator support MV: 8.5 L/min Temp (24hrs), Avg:98.2 F (36.8 C), Min:98.1 F (36.7 C), Max:98.3 F (36.8 C) Propofol: none BP: 95/80 and MAP: 85  Medications reviewed; 20 mg IV Pepcid BID, sliding scale Novolog, 18 units Lantus/day, 1 packet Miralax/day, 40 mg Deltasone/day.  Labs reviewed; CBGs: 156, 110, 127, and 244 mg/dL today, Cl: 93 mmol/L, BUN: 33 mg/dL.   Diet Order:  No diet orders on file  EDUCATION NEEDS:   No education needs have been identified at this time  Skin:  Skin Assessment: Reviewed RN Assessment  Last BM:  4/29  Height:   Ht Readings from Last 1 Encounters:  08/22/17 5\' 4"  (1.626 m)    Weight:   Wt Readings from Last 1 Encounters:  08/25/17 167 lb 12.3 oz (76.1 kg)    Ideal Body Weight:  54.55 kg  BMI:  Body mass index is 28.8 kg/m.  Estimated Nutritional Needs:   Kcal:  1650-1800 kcals (23-25 kcal/kg bw)  Protein:  87-100 grams (1.2-1.4 grams/kg)  Fluid:  >1.8 L fluid      Trenton Gammon, MS, RD, LDN, [facility] Inpatient Clinical Dietitian Pager # [phone] After hours/weekend pager # [phone]

## 2017-08-25 NOTE — Progress Notes (Signed)
PULMONARY / CRITICAL CARE MEDICINE   Name: Danielle Stevens MRN: [id number] DOB: [birth year]    ADMISSION DATE:  08/22/2017  CHIEF COMPLAINT: Unresponsive  HISTORY OF PRESENT ILLNESS:         This is a very unfortunate 47 year old who suffers from alpha-1 antitrypsin deficiency and has ventilator dependent respiratory failure.  At [facility] earlier today she was suddenly unresponsive and EMS was called.  She had minimal tracheal secretions.  She was suctioning given several nebulizer treatments in route to the hospital for her initial blood gas showed a pH of 7.16 a PCO2 of 114 and a PO2 of 249.  She is now awake and interactive and tells me that her breathing seems to be at baseline.  She denies any recent change in cough she denies any chest pain.   SUBJECTIVE:  Increased work of breathing this AM. Patient requesting a "treatment"   VITAL SIGNS: BP 108/73   Pulse 87   Temp 98.1 F (36.7 C) (Oral)   Resp (!) 22   Ht 5\' 4"  (1.626 m)   Wt 76.1 kg (167 lb 12.3 oz)   LMP  (LMP Unknown)   SpO2 95%   BMI 28.80 kg/m   HEMODYNAMICS:    VENTILATOR SETTINGS: Vent Mode: PRVC FiO2 (%):  [40 %-50 %] 40 % Set Rate:  [20 bmp] 20 bmp Vt Set:  [380 mL] 380 mL PEEP:  [5 cmH20] 5 cmH20 Plateau Pressure:  [15 cmH20-25 cmH20] 25 cmH20  INTAKE / OUTPUT:  Intake/Output Summary (Last 24 hours) at 08/25/2017 1217 Last data filed at 08/25/2017 1000 Gross per 24 hour  Intake 1760 ml  Output 450 ml  Net 1310 ml     PHYSICAL EXAMINATION: General: chronically ill appearing middle aged female with trach.  HEENT: Normocephalic atraumatic XLT tracheostomy unremarkable without significant drainage. Some leak noted. But trach repositioned and leak resolved.  Pulmonary: Prolonged expiratory phase, very poor air movement, no wheeze noted, mild accessory use on the ventilator. Cardiac: Tachycardic regular rate and rhythm no murmur Abdomen: Soft nontender PEG tube unremarkable positive bowel  sounds Extremities: Warm dry no significant edema Neuro: Awake, interactive   LABS:  BMET Recent Labs  Lab 08/22/17 1614 08/23/17 0009 08/25/17 0410  NA 138 138 137  K 5.3* 4.5 3.9  CL 90* 91* 93*  CO2 33* 38* 37*  BUN 35* 28* 33*  CREATININE 0.88 0.61 0.57  GLUCOSE 204* 241* 106*   Electrolytes Recent Labs  Lab 08/22/17 1614  08/23/17 0009 08/24/17 0651 08/24/17 1639 08/25/17 0410  CALCIUM 10.4*  --  9.9  --   --  9.3  MG 2.6*  --  2.3 2.3 2.3 2.0  PHOS  --    < > 4.4 3.2 3.4 3.1   < > = values in this interval not displayed.   CBC Recent Labs  Lab 08/22/17 1614 08/23/17 0009 08/25/17 0410  WBC 44.7* 24.8* 14.0*  HGB 10.2* 9.1* 8.5*  HCT 38.1 33.1* 31.1*  PLT 566* 266 209   Coag's Recent Labs  Lab 08/22/17 1614  INR 1.07   Sepsis Markers Recent Labs  Lab 08/22/17 1626 08/23/17 0009 08/24/17 0651  LATICACIDVEN 1.23 1.5  --   PROCALCITON  --  0.98 0.91   ABG Recent Labs  Lab 08/22/17 2220 08/23/17 0110 08/23/17 0506  PHART 7.204* 7.323* 7.312*  PCO2ART 103* 76.7* 79.2*  PO2ART 286* 173* 67.6*   Liver Enzymes Recent Labs  Lab 08/22/17 1614  AST 30  ALT  29  ALKPHOS 87  BILITOT 0.6  ALBUMIN 3.3*   Cardiac Enzymes No results for input(s): TROPONINI, PROBNP in the last 168 hours.  Glucose Recent Labs  Lab 08/24/17 1559 08/24/17 2025 08/25/17 0028 08/25/17 0419 08/25/17 0735 08/25/17 1133  GLUCAP 184* 172* 156* 110* 127* 244*   Imaging No results found. STUDIES:  Chest x-ray shows a right sided port and hyperexpanded lungs with what I suspect are all chronic changes.  There is an area of lucency over the mid left lung field which I suspect represents a large bulla not an anterior pneumothorax. ECHO 4/28>>>  ANTIBIOTICS: Levofloxacin 4/26>>> Vancomycin 4/27 >>  DISCUSSION:      This is a 47 year old with very advanced lung disease secondary to alpha-1 antitrypsin deficiency who became transiently unresponsive and was  found to be hypercarbic after arrival in the department of emergency medicine.   ASSESSMENT / PLAN:  Acute on chronic hypercarbic respiratory failure who [facility] from [facility] with worsening acidosis in setting of mucous plugging from purulent bronchitis Plan Discontinue Mucomyst add daily hypertonic saline nebulizers Continue full ventilator support Failed 12/5 immediately this AM - very tachypneic and asking for more support despite 12/5 for only a minute or two.  Mobilize Discontinue fentanyl infusion Metaneb  COPD: With acute exacerbation and purulent bronchitis. A1 antitrypsin deficient.  -Clinically improving Plan Prednisone to 40 mg via tube daily and taper over 10 days. Will keep at 40 today due to WOB and poor air movement. Continue bronchodilators (brovana, budesonide, duoneb, and PRN albuterol) Day #4 levofloxacin  Day #3 vancomycin  PRN albuterol now  Gram-positive cocci bacteremia preliminary methicillin resistance -This is an 2 out of 2 samples Plan Echocardiogram pending ABX as above (continue vanc) Follow-up culture ID to see re: possible need for Port removal.   Diabetes with hyperglycemia likely exacerbated by steroids Plan Continue sliding-scale insulin dosing Maintain basal dose  Trach status Plan Continue routine tracheostomy care Not currently a candidate for decannulation  Chronic anxiety and pain Plan Home meds continue  Anemia hemoglobin down to 8.5 from 10.5. - No obvious signs of bleeding Plan: Follow CBC Continue  Joneen Roach, AGACNP-BC [facility] Pulmonology/Critical Care Pager [phone] or [phone]  08/25/2017 12:20 PM

## 2017-08-26 ENCOUNTER — Inpatient Hospital Stay (HOSPITAL_COMMUNITY): Payer: Medicare Other

## 2017-08-26 ENCOUNTER — Inpatient Hospital Stay (HOSPITAL_COMMUNITY): Payer: Medicare Other | Admitting: Certified Registered Nurse Anesthetist

## 2017-08-26 ENCOUNTER — Encounter (HOSPITAL_COMMUNITY)
Admission: EM | Disposition: A | Discharge status: Nursing Home (Includes ICF) | Payer: Self-pay | Attending: Internal Medicine

## 2017-08-26 ENCOUNTER — Encounter (HOSPITAL_COMMUNITY): Payer: Self-pay | Admitting: Certified Registered Nurse Anesthetist

## 2017-08-26 HISTORY — PX: PORT-A-CATH REMOVAL: SHX5289

## 2017-08-26 LAB — CULTURE, BLOOD (ROUTINE X 2)
SPECIAL REQUESTS: ADEQUATE
Special Requests: ADEQUATE

## 2017-08-26 LAB — CBC
HCT: 29.2 % — ABNORMAL LOW (ref 36.0–46.0)
HEMOGLOBIN: 8 g/dL — AB (ref 12.0–15.0)
MCH: 23.1 pg — ABNORMAL LOW (ref 26.0–34.0)
MCHC: 27.4 g/dL — AB (ref 30.0–36.0)
MCV: 84.1 fL (ref 78.0–100.0)
Platelets: 190 10*3/uL (ref 150–400)
RBC: 3.47 MIL/uL — AB (ref 3.87–5.11)
RDW: 20 % — ABNORMAL HIGH (ref 11.5–15.5)
WBC: 12 10*3/uL — ABNORMAL HIGH (ref 4.0–10.5)

## 2017-08-26 LAB — GLUCOSE, CAPILLARY
GLUCOSE-CAPILLARY: 128 mg/dL — AB (ref 65–99)
GLUCOSE-CAPILLARY: 146 mg/dL — AB (ref 65–99)
GLUCOSE-CAPILLARY: 203 mg/dL — AB (ref 65–99)
GLUCOSE-CAPILLARY: 226 mg/dL — AB (ref 65–99)
Glucose-Capillary: 120 mg/dL — ABNORMAL HIGH (ref 65–99)
Glucose-Capillary: 210 mg/dL — ABNORMAL HIGH (ref 65–99)
Glucose-Capillary: 54 mg/dL — ABNORMAL LOW (ref 65–99)
Glucose-Capillary: 64 mg/dL — ABNORMAL LOW (ref 65–99)
Glucose-Capillary: 96 mg/dL (ref 65–99)

## 2017-08-26 LAB — BASIC METABOLIC PANEL
Anion gap: 8 (ref 5–15)
BUN: 25 mg/dL — ABNORMAL HIGH (ref 6–20)
CHLORIDE: 95 mmol/L — AB (ref 101–111)
CO2: 38 mmol/L — ABNORMAL HIGH (ref 22–32)
CREATININE: 0.49 mg/dL (ref 0.44–1.00)
Calcium: 9.1 mg/dL (ref 8.9–10.3)
GFR calc non Af Amer: >60 mL/min (ref >60)
Glucose, Bld: 42 mg/dL — CL (ref 65–99)
POTASSIUM: 4.3 mmol/L (ref 3.5–5.1)
Sodium: 141 mmol/L (ref 135–145)

## 2017-08-26 LAB — VANCOMYCIN, RANDOM: VANCOMYCIN RM: 10

## 2017-08-26 SURGERY — REMOVAL PORT-A-CATH
Anesthesia: General | Site: Chest | Laterality: Left

## 2017-08-26 MED ORDER — BUPIVACAINE-EPINEPHRINE (PF) 0.25% -1:200000 IJ SOLN
INTRAMUSCULAR | Status: AC
  Filled 2017-08-26: qty 30

## 2017-08-26 MED ORDER — FENTANYL CITRATE (PF) 250 MCG/5ML IJ SOLN
INTRAMUSCULAR | Status: AC
  Filled 2017-08-26: qty 5

## 2017-08-26 MED ORDER — DEXTROSE 50 % IV SOLN
25.0000 mL | Freq: Once | INTRAVENOUS | Status: AC
  Administered 2017-08-26: 25 mL via INTRAVENOUS

## 2017-08-26 MED ORDER — LEVOFLOXACIN 500 MG PO TABS
500.0000 mg | ORAL_TABLET | Freq: Every day | ORAL | Status: AC
  Administered 2017-08-26: 500 mg
  Filled 2017-08-26: qty 1

## 2017-08-26 MED ORDER — DEXTROSE 50 % IV SOLN
50.0000 mL | Freq: Once | INTRAVENOUS | Status: AC
  Administered 2017-08-26: 50 mL via INTRAVENOUS

## 2017-08-26 MED ORDER — DEXTROSE 50 % IV SOLN
INTRAVENOUS | Status: AC
  Filled 2017-08-26: qty 50

## 2017-08-26 MED ORDER — MIDAZOLAM HCL 5 MG/5ML IJ SOLN
INTRAMUSCULAR | Status: DC | PRN
  Administered 2017-08-26: 2 mg via INTRAVENOUS

## 2017-08-26 MED ORDER — BUSPIRONE HCL 15 MG PO TABS
15.0000 mg | ORAL_TABLET | Freq: Two times a day (BID) | ORAL | Status: DC
  Administered 2017-08-26 – 2017-09-25 (×61): 15 mg
  Filled 2017-08-26 (×2): qty 3
  Filled 2017-08-26 (×2): qty 1
  Filled 2017-08-26 (×13): qty 3
  Filled 2017-08-26: qty 1
  Filled 2017-08-26 (×4): qty 3
  Filled 2017-08-26: qty 1
  Filled 2017-08-26: qty 3
  Filled 2017-08-26: qty 1
  Filled 2017-08-26 (×4): qty 3
  Filled 2017-08-26 (×8): qty 1
  Filled 2017-08-26: qty 3
  Filled 2017-08-26: qty 1
  Filled 2017-08-26: qty 3
  Filled 2017-08-26 (×3): qty 1
  Filled 2017-08-26 (×9): qty 3
  Filled 2017-08-26: qty 1
  Filled 2017-08-26 (×2): qty 3
  Filled 2017-08-26: qty 1
  Filled 2017-08-26 (×5): qty 3
  Filled 2017-08-26 (×3): qty 1
  Filled 2017-08-26: qty 3

## 2017-08-26 MED ORDER — MIDAZOLAM HCL 2 MG/2ML IJ SOLN
INTRAMUSCULAR | Status: AC
Start: 2017-08-26 — End: 2017-08-26
  Filled 2017-08-26: qty 2

## 2017-08-26 MED ORDER — MONTELUKAST SODIUM 10 MG PO TABS
10.0000 mg | ORAL_TABLET | Freq: Every day | ORAL | Status: DC
  Administered 2017-08-26 – 2017-09-15 (×21): 10 mg
  Filled 2017-08-26 (×21): qty 1

## 2017-08-26 MED ORDER — DOXEPIN HCL 10 MG/ML PO CONC
10.0000 mg | Freq: Every day | ORAL | Status: DC
Start: 2017-08-26 — End: 2017-09-25
  Administered 2017-08-26 – 2017-09-24 (×30): 10 mg
  Filled 2017-08-26 (×31): qty 1

## 2017-08-26 MED ORDER — INSULIN GLARGINE 100 UNIT/ML ~~LOC~~ SOLN
18.0000 [IU] | Freq: Every day | SUBCUTANEOUS | Status: DC
  Administered 2017-08-27 – 2017-09-13 (×16): 18 [IU] via SUBCUTANEOUS
  Filled 2017-08-26 (×19): qty 0.18

## 2017-08-26 MED ORDER — BUPIVACAINE-EPINEPHRINE 0.25% -1:200000 IJ SOLN
INTRAMUSCULAR | Status: DC | PRN
  Administered 2017-08-26: 7 mL

## 2017-08-26 MED ORDER — PROPOFOL 10 MG/ML IV BOLUS
INTRAVENOUS | Status: AC
  Filled 2017-08-26: qty 20

## 2017-08-26 MED ORDER — VANCOMYCIN HCL IN DEXTROSE 750-5 MG/150ML-% IV SOLN
750.0000 mg | Freq: Two times a day (BID) | INTRAVENOUS | Status: AC
  Administered 2017-08-26 – 2017-09-10 (×31): 750 mg via INTRAVENOUS
  Filled 2017-08-26 (×35): qty 150

## 2017-08-26 MED ORDER — PHENYLEPHRINE 40 MCG/ML (10ML) SYRINGE FOR IV PUSH (FOR BLOOD PRESSURE SUPPORT)
PREFILLED_SYRINGE | INTRAVENOUS | Status: DC | PRN
  Administered 2017-08-26 (×2): 40 ug via INTRAVENOUS
  Administered 2017-08-26: 80 ug via INTRAVENOUS
  Administered 2017-08-26: 40 ug via INTRAVENOUS

## 2017-08-26 SURGICAL SUPPLY — 31 items
BENZOIN TINCTURE PRP APPL 2/3 (GAUZE/BANDAGES/DRESSINGS) ×3 IMPLANT
CHLORAPREP W/TINT 10.5 ML (MISCELLANEOUS) ×3 IMPLANT
CLOSURE WOUND 1/2 X4 (GAUZE/BANDAGES/DRESSINGS) ×1
COVER SURGICAL LIGHT HANDLE (MISCELLANEOUS) ×3 IMPLANT
DECANTER SPIKE VIAL GLASS SM (MISCELLANEOUS) ×3 IMPLANT
DERMABOND ADVANCED (GAUZE/BANDAGES/DRESSINGS) ×2
DERMABOND ADVANCED .7 DNX12 (GAUZE/BANDAGES/DRESSINGS) ×1 IMPLANT
DRAPE LAPAROTOMY T 102X78X121 (DRAPES) ×3 IMPLANT
DRAPE UTILITY XL STRL (DRAPES) ×6 IMPLANT
ELECT REM PT RETURN 9FT ADLT (ELECTROSURGICAL) ×3
ELECTRODE REM PT RTRN 9FT ADLT (ELECTROSURGICAL) ×1 IMPLANT
GAUZE SPONGE 2X2 8PLY STRL LF (GAUZE/BANDAGES/DRESSINGS) ×1 IMPLANT
GLOVE BIO SURGEON STRL SZ7 (GLOVE) ×3 IMPLANT
GLOVE BIOGEL PI IND STRL 7.5 (GLOVE) ×1 IMPLANT
GLOVE BIOGEL PI INDICATOR 7.5 (GLOVE) ×2
GOWN STRL REUS W/ TWL LRG LVL3 (GOWN DISPOSABLE) ×2 IMPLANT
GOWN STRL REUS W/TWL LRG LVL3 (GOWN DISPOSABLE) ×4
KIT BASIN OR (CUSTOM PROCEDURE TRAY) ×3 IMPLANT
KIT TURNOVER KIT B (KITS) ×3 IMPLANT
NEEDLE HYPO 25GX1X1/2 BEV (NEEDLE) ×3 IMPLANT
NS IRRIG 1000ML POUR BTL (IV SOLUTION) ×3 IMPLANT
PACK GENERAL/GYN (CUSTOM PROCEDURE TRAY) ×3 IMPLANT
PAD ARMBOARD 7.5X6 YLW CONV (MISCELLANEOUS) ×6 IMPLANT
SPONGE GAUZE 2X2 STER 10/PKG (GAUZE/BANDAGES/DRESSINGS) ×2
STRIP CLOSURE SKIN 1/2X4 (GAUZE/BANDAGES/DRESSINGS) ×2 IMPLANT
SUT MON AB 4-0 PC3 18 (SUTURE) ×3 IMPLANT
SUT VIC AB 3-0 SH 27 (SUTURE) ×2
SUT VIC AB 3-0 SH 27X BRD (SUTURE) ×1 IMPLANT
SYR CONTROL 10ML LL (SYRINGE) ×3 IMPLANT
TOWEL OR 17X24 6PK STRL BLUE (TOWEL DISPOSABLE) ×3 IMPLANT
TOWEL OR 17X26 10 PK STRL BLUE (TOWEL DISPOSABLE) ×3 IMPLANT

## 2017-08-26 NOTE — Progress Notes (Signed)
Attending:  I have seen and examined the patient with nurse practitioner/resident and agree with the note above.  We formulated the plan together and I elicited the following history.    Subjective: Port-a-cath removed this morning No acute events  Objective: Vitals:   08/26/17 1100 08/26/17 1211 08/26/17 1300 08/26/17 1400  BP: (!) 89/80 98/64 (!) 83/52 (!) 74/47  Pulse: (!) 103 85 79 78  Resp: 20 18 17 12   Temp:      TempSrc:      SpO2: 100% 100% 99% 97%  Weight:      Height:       Vent Mode: PRVC FiO2 (%):  [40 %] 40 % Set Rate:  [12 bmp] 12 bmp Vt Set:  [380 mL] 380 mL PEEP:  [5 cmH20] 5 cmH20 Plateau Pressure:  [16 cmH20-30 cmH20] 16 cmH20  Intake/Output Summary (Last 24 hours) at 08/26/2017 1505 Last data filed at 08/26/2017 1138 Gross per 24 hour  Intake 1260.67 ml  Output 200 ml  Net 1060.67 ml   General:  In bed on vent HENT: NCAT trach in place PULM: Poor air movement B, vent supported breathing CV: RRR, no mgr GI: BS+, soft, nontender MSK: normal bulk and tone Neuro: sedated on vent     CBC    Component Value Date/Time   WBC 12.0 (H) 08/26/2017 0631   RBC 3.47 (L) 08/26/2017 0631   HGB 8.0 (L) 08/26/2017 0631   HCT 29.2 (L) 08/26/2017 0631   PLT 190 08/26/2017 0631   MCV 84.1 08/26/2017 0631   MCH 23.1 (L) 08/26/2017 0631   MCHC 27.4 (L) 08/26/2017 0631   RDW 20.0 (H) 08/26/2017 0631   LYMPHSABS 0.9 08/22/2017 1614   MONOABS 4.5 (H) 08/22/2017 1614   EOSABS 0.0 08/22/2017 1614   BASOSABS 0.0 08/22/2017 1614    BMET    Component Value Date/Time   NA 141 08/26/2017 0631   K 4.3 08/26/2017 0631   CL 95 (L) 08/26/2017 0631   CO2 38 (H) 08/26/2017 0631   GLUCOSE 42 (LL) 08/26/2017 0631   BUN 25 (H) 08/26/2017 0631   CREATININE 0.49 08/26/2017 0631   CALCIUM 9.1 08/26/2017 0631   GFRNONAA >60 08/26/2017 0631   GFRAA >60 08/26/2017 0631    Impression/Plan:  Severe chronic respiratory failure with 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 Will def to ID   Heber Kearny, MD [facility] Pager: [phone] Cell: [phone] After 3pm or if no response, call [phone]

## 2017-08-26 NOTE — Progress Notes (Signed)
CSW informed that dtr does not want pt to return to [facility] if possible- spoke with pt dtr- she is hopeful that patient can be placed at [facility] which was the original plan 2 weeks ago.  CSW faxed referral to [facility] and called admissions coordinator who will follow up with dtr today to discuss [place] [facility]  CSW will continue to follow and assist with disposition when stable for DC  Burna Sis, LCSW Clinical Social Worker [phone]

## 2017-08-26 NOTE — Progress Notes (Signed)
Inpatient Diabetes Program Recommendations  AACE/ADA: New Consensus Statement on Inpatient Glycemic Control (2015)  Target Ranges:  Prepandial:   less than 140 mg/dL      Peak postprandial:   less than 180 mg/dL (1-2 hours)      Critically ill patients:  140 - 180 mg/dL   Lab Results  Component Value Date   GLUCAP 64 (L) 08/26/2017    Review of Glycemic Control  Hypoglycemia this am. Needs insulin adjustment.   Inpatient Diabetes Program Recommendations:     Decrease Lantus to 14 units QHS  Will continue to follow.  Thank you. Ailene Ards, RD, LDN, CDE Inpatient Diabetes Coordinator [phone]

## 2017-08-26 NOTE — Progress Notes (Signed)
Hypoglycemic Event  CBG: 54  Treatment: D50 IV 25 mL  Symptoms: Pale, Sweaty and Shaky  Follow-up CBG: Time: 0415 CBG Result: 120  Possible Reasons for Event: Inadequate meal intake  Comments/MD notified: n/a    Danielle Stevens

## 2017-08-26 NOTE — Anesthesia Preprocedure Evaluation (Signed)
Anesthesia Evaluation  Patient identified by MRN, date of birth, ID band Patient awake    Reviewed: Allergy & Precautions, NPO status   Airway Mallampati: II  TM Distance: >3 FB     Dental   Pulmonary asthma , pneumonia, COPD, former smoker,    breath sounds clear to auscultation       Cardiovascular hypertension,  Rhythm:Regular Rate:Normal     Neuro/Psych    GI/Hepatic negative GI ROS,   Endo/Other  diabetes  Renal/GU      Musculoskeletal   Abdominal   Peds  Hematology   Anesthesia Other Findings   Reproductive/Obstetrics                             Anesthesia Physical Anesthesia Plan  ASA: III  Anesthesia Plan: General   Post-op Pain Management:    Induction: Intravenous  PONV Risk Score and Plan: 3 and Treatment may vary due to age or medical condition  Airway Management Planned: LMA  Additional Equipment:   Intra-op Plan:   Post-operative Plan: Extubation in OR  Informed Consent: I have reviewed the patients History and Physical, chart, labs and discussed the procedure including the risks, benefits and alternatives for the proposed anesthesia with the patient or authorized representative who has indicated his/her understanding and acceptance.   Dental advisory given  Plan Discussed with: CRNA and Anesthesiologist  Anesthesia Plan Comments:         Anesthesia Quick Evaluation

## 2017-08-26 NOTE — Progress Notes (Signed)
RT NOTE: Chest PT held at this time. Patient asleep.

## 2017-08-26 NOTE — Op Note (Signed)
Pre-op diagnosis:  Bacteremia - chronic port Post-op diagnosis:  Same Procedure performed: Removal of right subclavian port  Surgeon:Nikkita Adeyemi K Nickalos Petersen Anesthesia: General Indications:This is a 47 yo [place] female with a history of alpha-1-antitrypsin deficiency who has chronic ventilator dependent respiratory failure.  She has been trached and PEG tube placed for nutritional support.  Her daughter, Joice Lofts, states that she has had a PAC for many years, likely placed in [place], [place] for poor venous access.  The patient currently resides at [facility] for her care.  She was brought in secondary to hypercarbia.  This has been corrected, but now she has been found to have MRSA bacteremia.  ID has seen the patient and request a PAC removal secondary to this infection  Description of procedure: The patient was brought to the operating room and placed in the supine position on the operating room table with her right arm tucked at her side.  After an adequate level of general anesthesia was obtained, her right chest was prepped with ChloraPrep and draped in sterile fashion.  A timeout was taken to ensure the proper patient and proper procedure.  We infiltrated the area over the port with 0.25% Marcaine with epinephrine.  A transverse incision was made through the old scar.  We dissected down to the surface of the port.  We dissected completely around the port and removed 3 sutures that were holding it in place.  The port was removed and we held direct pressure for several minutes.  There is no sign of bleeding.  We excised the fibrous sheath around the port.  The wound was then inspected for hemostasis.  We closed the wound with 3-0 Vicryl and 4-0 Monocryl.  Dermabond was used to seal the skin.  The patient was then transported back to the intensive care unit in stable condition.  All sponge, instrument, and needle counts are correct.  Wilmon Arms. Corliss Skains, MD, [facility]  General/ Trauma  Surgery  08/26/2017 11:58 AM

## 2017-08-26 NOTE — Progress Notes (Signed)
Pharmacy Antibiotic Note  Danielle Stevens is a 47 y.o. female admitted on 08/22/2017 with MRSE bacteremia to have removal of port-a-cath today in OR. Pharmacy has been consulted for vancomycin dosing.  WBC is trending down at 12. Afebrile.   Respiratory culture is normal flora.   Vancomycin trough  elevated at 31 on 750mg  IV every 8 hours (this is a true steady state level) yesterday. SCr is stable at 0.49 but suspect falsely low based on this level. Low UOP recorded at 0.2 mL/kg/hr.  Vancomycin trough down to 10 this AM - will resume.  New Ke ~0.067 with estimated t-1/2 of ~10 hrs.   Plan: Restart Vancomycin 750 mg IV every 12 hours.  Monitor UOP closely as suspect SCr is falsely low.  Will repeat Vancomycin trough as appropriate.  Follow any update to c/s, clinical progression, renal function, level PRN Consider if Levaquin is still needed with negative respiratory culture.   Height: 5\' 4"  (162.6 cm) Weight: 168 lb 14 oz (76.6 kg) IBW/kg (Calculated) : 54.7  Temp (24hrs), Avg:98.5 F (36.9 C), Min:97.6 F (36.4 C), Max:99.2 F (37.3 C)  Recent Labs  Lab 08/22/17 1614 08/22/17 1626 08/23/17 0009 08/25/17 0410 08/25/17 1115 08/26/17 0631  WBC 44.7*  --  24.8* 14.0*  --  12.0*  CREATININE 0.88  --  0.61 0.57  --  0.49  LATICACIDVEN  --  1.23 1.5  --   --   --   VANCOTROUGH  --   --   --   --  31*  --   VANCORANDOM  --   --   --   --   --  10    Estimated Creatinine Clearance: 86.2 mL/min (by C-G formula based on SCr of 0.49 mg/dL).    Allergies  Allergen Reactions  . Wasp Venom Protein Shortness Of Breath  . Adhesive [Tape] Other (See Comments)    Bruises   . Coconut Oil Hives  . Latex Other (See Comments)    Bruises   . Robitussin Severe Multi-Symp [Phenylephrine-Dm-Gg-Apap] Diarrhea and Other (See Comments)    Allergic, per verbal MAR  . Shellfish-Derived Products Hives and Other (See Comments)    Allergic, per verbal MAR    Levaquin 4/26>>  Vancomycin  4/27>>  4/26 TA: normal respiratory flora 4/26 BCx: 2/2 MRSE 4/26 BCID: MRSE 4/26 MRSA PCR: pos   Thank you for allowing pharmacy to be a part of this patient's care.  Link Snuffer, PharmD, BCPS, BCCCP Clinical Pharmacist Clinical phone 08/26/2017 until 3:30PM [phone] After hours, please call #[phone] 08/26/2017 8:54 AM

## 2017-08-26 NOTE — Progress Notes (Signed)
PULMONARY / CRITICAL CARE MEDICINE   Name: Danielle Stevens MRN: [id number] DOB: November 03, 1970    ADMISSION DATE:  08/22/2017  CHIEF COMPLAINT: Unresponsive  HISTORY OF PRESENT ILLNESS:         This is a very unfortunate 47 year old who suffers from alpha-1 antitrypsin deficiency and has ventilator dependent respiratory failure.  At [facility] earlier today she was suddenly unresponsive and EMS was called.  She had minimal tracheal secretions.  She was suctioning given several nebulizer treatments in route to the hospital for her initial blood gas showed a pH of 7.16 a PCO2 of 114 and a PO2 of 249.  She is now awake and interactive and tells me that her breathing seems to be at baseline.  She denies any recent change in cough she denies any chest pain.   SUBJECTIVE:  Groggy post op   VITAL SIGNS: BP 98/64   Pulse 85   Temp 97.6 F (36.4 C) (Oral)   Resp 18   Ht 5\' 4"  (1.626 m)   Wt 76.6 kg (168 lb 14 oz)   LMP  (LMP Unknown)   SpO2 100%   BMI 28.99 kg/m   HEMODYNAMICS:    VENTILATOR SETTINGS: Vent Mode: PRVC FiO2 (%):  [40 %] 40 % Set Rate:  [12 bmp] 12 bmp Vt Set:  [380 mL] 380 mL PEEP:  [5 cmH20] 5 cmH20 Plateau Pressure:  [16 cmH20-30 cmH20] 16 cmH20  INTAKE / OUTPUT:  Intake/Output Summary (Last 24 hours) at 08/26/2017 1328 Last data filed at 08/26/2017 1138 Gross per 24 hour  Intake 1360.67 ml  Output 200 ml  Net 1160.67 ml     PHYSICAL EXAMINATION:  General: chronically ill middle aged female on vent HEENT: [place]/AT, Trach in place. No appreciable JVD Pulmonary: Poor air movement. Clear.  Cardiac: RRR, no MRG Abdomen: Soft, non-distended. PEG Extremities: Warm dry no significant edema Neuro: Awake, interactive   LABS:  BMET Recent Labs  Lab 08/23/17 0009 08/25/17 0410 08/26/17 0631  NA 138 137 141  K 4.5 3.9 4.3  CL 91* 93* 95*  CO2 38* 37* 38*  BUN 28* 33* 25*  CREATININE 0.61 0.57 0.49  GLUCOSE 241* 106* 42*   Electrolytes Recent Labs   Lab 08/23/17 0009  08/24/17 1639 08/25/17 0410 08/25/17 1640 08/26/17 0631  CALCIUM 9.9  --   --  9.3  --  9.1  MG 2.3   < > 2.3 2.0 2.0  --   PHOS 4.4   < > 3.4 3.1 3.7  --    < > = values in this interval not displayed.   CBC Recent Labs  Lab 08/23/17 0009 08/25/17 0410 08/26/17 0631  WBC 24.8* 14.0* 12.0*  HGB 9.1* 8.5* 8.0*  HCT 33.1* 31.1* 29.2*  PLT 266 209 190   Coag's Recent Labs  Lab 08/22/17 1614  INR 1.07   Sepsis Markers Recent Labs  Lab 08/22/17 1626 08/23/17 0009 08/24/17 0651  LATICACIDVEN 1.23 1.5  --   PROCALCITON  --  0.98 0.91   ABG Recent Labs  Lab 08/22/17 2220 08/23/17 0110 08/23/17 0506  PHART 7.204* 7.323* 7.312*  PCO2ART 103* 76.7* 79.2*  PO2ART 286* 173* 67.6*   Liver Enzymes Recent Labs  Lab 08/22/17 1614  AST 30  ALT 29  ALKPHOS 87  BILITOT 0.6  ALBUMIN 3.3*   Cardiac Enzymes No results for input(s): TROPONINI, PROBNP in the last 168 hours.  Glucose Recent Labs  Lab 08/25/17 2335 08/26/17 0356 08/26/17 0414 08/26/17  1610 08/26/17 1019 08/26/17 1207  GLUCAP 210* 54* 120* 64* 96 128*   Imaging Dg Chest Port 1 View  Result Date: 08/26/2017 CLINICAL DATA:  Respiratory distress, shortness of breath. History of asthma-COPD. Former smoker. EXAM: PORTABLE CHEST 1 VIEW COMPARISON:  Portable chest x-ray of August 22, 2017 FINDINGS: The lungs remain hyperinflated. There is increased density in the right infrahilar region more conspicuous today. The left costophrenic gutter is excluded from the study. The heart and pulmonary vascularity are normal. The porta catheter tip projects over the midportion of the SVC. IMPRESSION: Right infrahilar atelectasis or pneumonia. Underlying COPD. No CHF. The tracheostomy tube tip projects approximately 1 cm above the clavicular heads. Electronically Signed   By: David  [place] M.D.   On: 08/26/2017 09:23   STUDIES:  Chest x-ray shows a right sided port and hyperexpanded lungs with what I  suspect are all chronic changes.  There is an area of lucency over the mid left lung field which I suspect represents a large bulla not an anterior pneumothorax. ECHO 4/28>>>  ANTIBIOTICS: Levofloxacin 4/26>>> Vancomycin 4/27 >>  DISCUSSION:      This is a 47 year old with very advanced lung disease secondary to alpha-1 antitrypsin deficiency who became transiently unresponsive and was found to be hypercarbic after arrival in the department of emergency medicine.   ASSESSMENT / PLAN:  Acute on chronic hypercarbic respiratory failure who presents from [facility] with worsening acidosis in setting of mucous plugging from purulent bronchitis Plan Hypertonic saline nebs x 1 more day.  Continue full ventilator support Maybe SBT tomorrow. Failed immediately yesterday.  Mobilize Discontinue fentanyl infusion  COPD: With acute exacerbation and purulent bronchitis. A1 antitrypsin deficient.  Plan Prednisone to 30 mg via tube daily and taper every other day.  Continue bronchodilators (brovana, budesonide, duoneb, and PRN albuterol) DC levofloxacin  Gram-positive cocci bacteremia preliminary methicillin resistance -This is an 2 out of 2 samples (4/4 bottles) Plan No vegetation described on echo, hold off TEE for now.  Continue vancomycin Port removed 4/30 Id recc. 2 weeks of vanco after port removed. Tentative stop date 5/14  Diabetes with hyperglycemia likely exacerbated by steroids Plan Continue sliding-scale insulin dosing Maintain basal dose - (hypoglycemia overnight but was NPO, will hold lantus tonight and restart tomorrow)  Trach status Plan Continue routine tracheostomy care Not currently a candidate for decannulation  Chronic anxiety and pain Plan Home meds continue  Anemia hemoglobin down to 8.8 from 10.5. - No obvious signs of bleeding Plan: Follow CBC Transfuse for HGB < 7   Joneen Roach, AGACNP-BC [facility] Pulmonology/Critical Care Pager [phone] or [phone]  08/26/2017 1:28 PM

## 2017-08-26 NOTE — Transfer of Care (Signed)
Immediate Anesthesia Transfer of Care Note  Patient: Danielle Stevens  Procedure(s) Performed: REMOVAL PORT-A-CATH (Left Chest)  Patient Location: ICU  Anesthesia Type:General  Level of Consciousness: awake, patient cooperative and responds to stimulation  Airway & Oxygen Therapy: Patient Spontanous Breathing and Patient placed on Ventilator (see vital sign flow sheet for setting)  Post-op Assessment: Report given to RN and Post -op Vital signs reviewed and stable  Post vital signs: Reviewed and stable  Last Vitals:  Vitals Value Taken Time  BP    Temp    Pulse    Resp    SpO2      Last Pain:  Vitals:   08/26/17 0814  TempSrc: Oral  PainSc:       Patients Stated Pain Goal: 0 (08/23/17 1143)  Complications: No apparent anesthesia complications

## 2017-08-26 NOTE — Progress Notes (Signed)
RT NOTE: Chest PT held at this time. Patient just returned from OR.

## 2017-08-26 NOTE — Anesthesia Postprocedure Evaluation (Signed)
Anesthesia Post Note  Patient: Danielle Stevens  Procedure(s) Performed: REMOVAL PORT-A-CATH (Left Chest)     Anesthesia Post Evaluation  Last Vitals:  Vitals:   08/26/17 1400 08/26/17 1539  BP: (!) 74/47 (!) 90/53  Pulse: 78 79  Resp: 12 18  Temp:    SpO2: 97% 99%    Last Pain:  Vitals:   08/26/17 0814  TempSrc: Oral  PainSc:                  Rowene Suto

## 2017-08-27 ENCOUNTER — Encounter (HOSPITAL_COMMUNITY): Payer: Self-pay | Admitting: Surgery

## 2017-08-27 DIAGNOSIS — R110 Nausea: Secondary | ICD-10-CM

## 2017-08-27 LAB — C DIFFICILE QUICK SCREEN W PCR REFLEX
C Diff antigen: POSITIVE — AB
C Diff toxin: NEGATIVE

## 2017-08-27 LAB — GLUCOSE, CAPILLARY
GLUCOSE-CAPILLARY: 111 mg/dL — AB (ref 65–99)
GLUCOSE-CAPILLARY: 153 mg/dL — AB (ref 65–99)
GLUCOSE-CAPILLARY: 95 mg/dL (ref 65–99)
Glucose-Capillary: 153 mg/dL — ABNORMAL HIGH (ref 65–99)
Glucose-Capillary: 151 mg/dL — ABNORMAL HIGH (ref 65–99)
Glucose-Capillary: 189 mg/dL — ABNORMAL HIGH (ref 65–99)

## 2017-08-27 LAB — CLOSTRIDIUM DIFFICILE BY PCR, REFLEXED: CDIFFPCR: NEGATIVE

## 2017-08-27 NOTE — Care Management Note (Addendum)
Case Management Note  Patient Details  Name: Danielle Stevens MRN: [id number] Date of Birth: 03-Dec-1970  Subjective/Objective:  History of Alpha-1-antitrypsin deficiency, Anemia, Anxiety, Asthma, COPD, DM, Emphysema, Tracheostomy and PEG tube placement and Hypertension.   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  When patient medically stable for dc and bed available [facility], per CSW arrangements.  NCM will continue to follow how patient progresses.  Expected Discharge Date:    To Be Determined.              Expected Discharge Plan:  Skilled Nursing Facility  In-House Referral:  Clinical Social Work  Discharge planning Services  CM Consult  Post Acute Care Choice:    Choice offered to:     DME Arranged:    DME Agency:     HH Arranged:    HH Agency:     Status of Service:  In process, will continue to follow  If discussed at Long Length of Stay Meetings, dates discussed:   Case was discussed in Length of Stay, per Dr. Jacky Kindle __LTACH__ was recommended.  Notified by [facility] with [facility] that patient has no LTACH days left.  Additional Comments:  Yancey Flemings, RN 08/27/2017, 1:02 PM

## 2017-08-27 NOTE — Progress Notes (Signed)
CPT held at this time due to Patient Asleep.

## 2017-08-27 NOTE — Progress Notes (Signed)
[facility] requested vent flowsheets- requested clinicals faxed to facility for review  CSW will continue to follow  Burna Sis, LCSW Clinical Social Worker [phone]

## 2017-08-27 NOTE — Progress Notes (Signed)
PROGRESS NOTE    Patient: Danielle Stevens     PCP: Patient, No Pcp Per                    DOB: December 25, 1970            DOA: 08/22/2017 DXA:[id number]             DOS: 08/27/2017, 2:01 PM   LOS: 5 days   Date of Service: The patient was seen and examined on 08/27/2017  Subjective:  Patient was seen and examined this morning, stable no acute distress Denies any fever, chills nausea vomiting. Has not chest pain or shortness of breath No issues overnight  ----------------------------------------------------------------------------------------------------------------------  Brief Narrative:  47 year old F, who suffers from alpha-1 antitrypsin deficiency and has ventilator dependent respiratory failure.  At [facility] she was suddenly unresponsive and EMS was called.  She had minimal tracheal secretions.  She was suctioning given several nebulizer treatments in route to the hospital for her initial blood gas showed a pH of 7.16 a PCO2 of 114 and a PO2 of 249.  She has a history of chronic respiratory failure and dysphagia, chronic trach and PEG. She was admitted to ICU, vent setting was adjusted, continuous respiratory toiletry, suctions, IV antibiotics, DuoNeb bronchodilator.  Subsequently patient was stabilized.  Awake and responsive. Was deemed stable to be transferred out of ICU on 08/26/2017.  08/26/17 - REMOVAL PORT-A-CATH (Left)    Active Problems:   Hypercarbia   Acute on chronic respiratory failure with hypoxia and hypercapnia (HCC)   COPD exacerbation (HCC)   VAP (ventilator-associated pneumonia) (HCC)   Assessment & Plan:    Acute on chronic hypercarbic respiratory  - failure who [facility] from [facility] with worsening acidosis in setting of mucous plugging from purulent bronchitis -S/P Hypertonic saline nebs  - we will Continue full ventilator support-attempt to taper down to need -Continue nebs  COPD: With acute exacerbation and purulent bronchitis. A1 antitrypsin  deficient.  Prednisone to 30 mg via tube daily and taper every other day.  Continue bronchodilators (brovana, budesonide, duoneb, and PRN albuterol) DC levofloxacin  Sepsis/bacteremia / Gram-positive cocci bacteremia preliminary methicillin resistance -This is an 2 out of 2 samples (4/4 bottles) No vegetation described on echo, hold off TEE for now.  Continue vancomycin Port removed 4/30 Infectious disease team following -recommending 2 weeks of vanco after port removed. Tentative stop date 5/14  Diabetes with hyperglycemia likely exacerbated by steroids Continue sliding-scale insulin dosing Maintain basal dose - (hypoglycemia overnight but was NPO? will hold lantus tonight and restart patient is stable, tolerating PEG tube feedings  Trach status Continue routine tracheostomy care Not currently a candidate for decannulation  Chronic anxiety and pain Home meds continue  Anemia hemoglobin down to 8.8 from 10.5. - No obvious signs of bleeding -Follow CBC Transfuse for HGB < 7  Severe chronic respiratory failure with hypoxemia exacerbated by COPD exacerbation and history of chronic alpha-1 antitrypsin deficiency -Pulmonary team following, the new bronchodilators -No plan to wean off vent as patient is dependent to tracheostomy -Continue tracheostomy management      DVT prophylaxis:   SCDs/compression stockings       Code Status:         Full code  Family Communication:  The above findings and plan of care has been discussed with patient  Disposition Plan: > 3 days LTAC           Consultants:   ID  Critical care team  Surgery  Pulmonary  Procedures:   Continue vent through chronic tracheostomy  Antimicrobials:   Levofloxacin 4/26>>>4/30 Vancomycin 4/27 >>   Objective: Vitals:   08/27/17 1000 08/27/17 1100 08/27/17 1142 08/27/17 1200  BP: (!) 86/56 91/64  101/67  Pulse: 79 68  71  Resp: _0 Temp:   99.6 F (37.6 C)   TempSrc:   Axillary     SpO2: 99% 99%  99%  Weight:      Height:        Intake/Output Summary (Last 24 hours) at 08/27/2017 1401 Last data filed at 08/27/2017 1200 Gross per 24 hour  Intake 1630.67 ml  Output 430 ml  Net 1200.67 ml   Filed Weights   08/25/17 0438 08/26/17 0411 08/27/17 0500  Weight: 76.1 kg (167 lb 12.3 oz) 76.6 kg (168 lb 14 oz) 74.5 kg (164 lb 3.9 oz)    Examination:  General exam: Appears calm and comfortable  Psychiatry: Patient is not verbal has tracheostomy in place, appears to nod her head and understanding conversation  HEENT: 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 No JVD, murmurs, rubs, gallops or clicks. No pedal edema. Gastrointestinal system: PEG tube in place abd. nondistended, soft and nontender. No organomegaly or masses felt. Normal bowel sounds heard. Central nervous system: Alert,  No focal neurological deficits. Extremities: Symmetric 5 x 5 power. Skin: No rashes, lesions or ulcers   Data Reviewed: I have personally reviewed following labs and imaging studies  CBC: Recent Labs  Lab 08/22/17 1614 08/23/17 0009 08/25/17 0410 08/26/17 0631  WBC 44.7* 24.8* 14.0* 12.0*  NEUTROABS 39.3*  --   --   --   HGB 10.2* 9.1* 8.5* 8.0*  HCT 38.1 33.1* 31.1* 29.2*  MCV 87.6 85.8 84.7 84.1  PLT 566* 266 209 992   Basic Metabolic Panel: Recent Labs  Lab 08/22/17 1614 08/23/17 0009 08/24/17 0651 08/24/17 1639 08/25/17 0410 08/25/17 1640 08/26/17 0631  NA 138 138  --   --  137  --  141  K 5.3* 4.5  --   --  3.9  --  4.3  CL 90* 91*  --   --  93*  --  95*  CO2 33* 38*  --   --  37*  --  38*  GLUCOSE 204* 241*  --   --  106*  --  42*  BUN 35* 28*  --   --  33*  --  25*  CREATININE 0.88 0.61  --   --  0.57  --  0.49  CALCIUM 10.4* 9.9  --   --  9.3  --  9.1  MG 2.6* 2.3 2.3 2.3 2.0 2.0  --   PHOS  --  4.4 3.2 3.4 3.1 3.7  --     GFR: Estimated Creatinine Clearance: 85 mL/min (by C-G formula based on SCr of 0.49 mg/dL). Liver Function Tests: Recent Labs  Lab 08/22/17 1614  AST 30  ALT 29  ALKPHOS 87  BILITOT 0.6  PROT 7.0  ALBUMIN 3.3*   No results for input(s): LIPASE, AMYLASE in the last 168 hours. No results for input(s): AMMONIA in the last 168 hours. Coagulation Profile: Recent Labs  Lab 08/22/17 1614  INR 1.07   Cardiac Enzymes: No results for input(s): CKTOTAL, CKMB, CKMBINDEX, TROPONINI in the last 168 hours. BNP (last 3 results) No results for input(s):  PROBNP in the last 8760 hours. HbA1C: No results for input(s): HGBA1C in the last 72 hours. CBG: Recent Labs  Lab 08/26/17 1938 08/26/17 2341 08/27/17 0347 08/27/17 0755 08/27/17 1132  GLUCAP 226* 146* 111* 153* 151*   Lipid Profile: No results for input(s): CHOL, HDL, LDLCALC, TRIG, CHOLHDL, LDLDIRECT in the last 72 hours. Thyroid Function Tests: No results for input(s): TSH, T4TOTAL, FREET4, T3FREE, THYROIDAB in the last 72 hours. Anemia Panel: No results for input(s): VITAMINB12, FOLATE, FERRITIN, TIBC, IRON, RETICCTPCT in the last 72 hours. Sepsis Labs: Recent Labs  Lab 08/22/17 1626 08/23/17 0009 08/24/17 0651  PROCALCITON  --  0.98 0.91  LATICACIDVEN 1.23 1.5  --     Recent Results (from the past 240 hour(s))  Culture, blood (Routine x 2)     Status: Abnormal   Collection Time: 08/22/17  4:06 PM  Result Value Ref Range Status   Specimen Description BLOOD LEFT FOREARM  Final   Special Requests   Final    BOTTLES DRAWN AEROBIC AND ANAEROBIC Blood Culture adequate volume   Culture  Setup Time   Final    GRAM POSITIVE COCCI IN BOTH AEROBIC AND ANAEROBIC BOTTLES CRITICAL RESULT CALLED TO, READ BACK BY AND VERIFIED WITH: T COLBARD,PHARMD AT 1556 08/23/17 BY L BENFIELD Performed at [facility], [place] [address], [place], [place] [address]    Culture STAPHYLOCOCCUS SPECIES (COAGULASE NEGATIVE) (A)  Final    Report Status 08/26/2017 FINAL  Final   Organism ID, Bacteria STAPHYLOCOCCUS SPECIES (COAGULASE NEGATIVE)  Final      Susceptibility   Staphylococcus species (coagulase negative) - MIC*    CIPROFLOXACIN 4 RESISTANT Resistant     ERYTHROMYCIN >=8 RESISTANT Resistant     GENTAMICIN <=0.5 SENSITIVE Sensitive     OXACILLIN >=4 RESISTANT Resistant     TETRACYCLINE <=1 SENSITIVE Sensitive     VANCOMYCIN <=0.5 SENSITIVE Sensitive     TRIMETH/SULFA <=10 SENSITIVE Sensitive     CLINDAMYCIN <=0.25 SENSITIVE Sensitive     RIFAMPIN <=0.5 SENSITIVE Sensitive     Inducible Clindamycin NEGATIVE Sensitive     * STAPHYLOCOCCUS SPECIES (COAGULASE NEGATIVE)  Blood Culture ID Panel (Reflexed)     Status: Abnormal   Collection Time: 08/22/17  4:06 PM  Result Value Ref Range Status   Enterococcus species NOT DETECTED NOT DETECTED Final   Listeria monocytogenes NOT DETECTED NOT DETECTED Final   Staphylococcus species DETECTED (A) NOT DETECTED Final    Comment: Methicillin (oxacillin) resistant coagulase negative staphylococcus. Possible blood culture contaminant (unless isolated from more than one blood culture draw or clinical case suggests pathogenicity). No antibiotic treatment is indicated for blood  culture contaminants. CRITICAL RESULT CALLED TO, READ BACK BY AND VERIFIED WITH: T COLBARD,PHARMD AT 1556 08/23/17 BY L BENFIELD    Staphylococcus aureus NOT DETECTED NOT DETECTED Final   Methicillin resistance DETECTED (A) NOT DETECTED Final    Comment: CRITICAL RESULT CALLED TO, READ BACK BY AND VERIFIED WITH: T COLBARD,PHARMD AT 1556 08/23/17 BY L BENFIELD    Streptococcus species NOT DETECTED NOT DETECTED Final   Streptococcus agalactiae NOT DETECTED NOT DETECTED Final   Streptococcus pneumoniae NOT DETECTED NOT DETECTED Final   Streptococcus pyogenes NOT DETECTED NOT DETECTED Final   Acinetobacter baumannii NOT DETECTED NOT DETECTED Final   Enterobacteriaceae species NOT DETECTED NOT DETECTED Final    Enterobacter cloacae complex NOT DETECTED NOT DETECTED Final   Escherichia coli NOT DETECTED NOT DETECTED Final   Klebsiella oxytoca NOT DETECTED NOT DETECTED  Final   Klebsiella pneumoniae NOT DETECTED NOT DETECTED Final   Proteus species NOT DETECTED NOT DETECTED Final   Serratia marcescens NOT DETECTED NOT DETECTED Final   Haemophilus influenzae NOT DETECTED NOT DETECTED Final   Neisseria meningitidis NOT DETECTED NOT DETECTED Final   Pseudomonas aeruginosa NOT DETECTED NOT DETECTED Final   Candida albicans NOT DETECTED NOT DETECTED Final   Candida glabrata NOT DETECTED NOT DETECTED Final   Candida krusei NOT DETECTED NOT DETECTED Final   Candida parapsilosis NOT DETECTED NOT DETECTED Final   Candida tropicalis NOT DETECTED NOT DETECTED Final    Comment: Performed at [facility], [place] [address] [place], [place] [address]  MRSA PCR Screening     Status: Abnormal   Collection Time: 08/22/17  8:45 PM  Result Value Ref Range Status   MRSA by PCR POSITIVE (A) NEGATIVE Final    Comment:        The GeneXpert MRSA Assay (FDA approved for NASAL specimens only), is one component of a comprehensive MRSA colonization surveillance program. It is not intended to diagnose MRSA infection nor to guide or monitor treatment for MRSA infections. RESULT CALLED TO, READ BACK BY AND VERIFIED WITHBishop Limbo RN 5885 08/23/17 A BROWNING Performed at [facility], [place] [address], [place], [place] [address]   Culture, blood (Routine x 2)     Status: Abnormal   Collection Time: 08/22/17  9:22 PM  Result Value Ref Range Status   Specimen Description BLOOD RIGHT HAND  Final   Special Requests   Final    BOTTLES DRAWN AEROBIC AND ANAEROBIC Blood Culture adequate volume   Culture  Setup Time   Final    GRAM POSITIVE COCCI IN CLUSTERS IN BOTH AEROBIC AND ANAEROBIC BOTTLES CRITICAL RESULT CALLED TO, READ BACK BY AND VERIFIED WITH: B MANCHERIL,PHARMD AT 1856 08/23/17 BY L BENFIELD    Culture  (A)  Final    STAPHYLOCOCCUS SPECIES (COAGULASE NEGATIVE) SUSCEPTIBILITIES PERFORMED ON PREVIOUS CULTURE WITHIN THE LAST 5 DAYS. Performed at [facility], [place] [address] [place], [place] [address]    Report Status 08/26/2017 FINAL  Final  Culture, respiratory (tracheal aspirate)     Status: None   Collection Time: 08/22/17 11:16 PM  Result Value Ref Range Status   Specimen Description TRACHEAL ASPIRATE  Final   Special Requests NONE  Final   Gram Stain   Final    ABUNDANT WBC PRESENT, PREDOMINANTLY PMN RARE SQUAMOUS EPITHELIAL CELLS PRESENT MODERATE GRAM POSITIVE RODS FEW GRAM NEGATIVE COCCI IN PAIRS RARE GRAM NEGATIVE RODS    Culture   Final    Consistent with normal respiratory flora. Performed at [facility], [place] [address], [place], [place] [address]    Report Status 08/25/2017 FINAL  Final  C difficile quick scan w PCR reflex     Status: Abnormal   Collection Time: 08/27/17 12:36 PM  Result Value Ref Range Status   C Diff antigen POSITIVE (A) NEGATIVE Final   C Diff toxin NEGATIVE NEGATIVE Final   C Diff interpretation Results are indeterminate. See PCR results.  Final      Radiology Studies: Dg Chest Port 1 View  Result Date: 08/26/2017 CLINICAL DATA:  Respiratory distress, shortness of breath. History of asthma-COPD. Former smoker. EXAM: PORTABLE CHEST 1 VIEW COMPARISON:  Portable chest x-ray of August 22, 2017 FINDINGS: The lungs remain hyperinflated. There is increased density in the right infrahilar region more conspicuous today. The left costophrenic  gutter is excluded from the study. The heart and pulmonary vascularity are normal. The porta catheter tip projects over the midportion of the SVC. IMPRESSION: Right infrahilar atelectasis or pneumonia. Underlying COPD. No CHF. The tracheostomy tube tip projects approximately 1 cm above the clavicular heads. Electronically Signed   By: David  Martinique M.D.   On: 08/26/2017 09:23    Scheduled Meds: .  arformoterol  15 mcg Nebulization BID  . budesonide (PULMICORT) nebulizer solution  0.5 mg Nebulization BID  . busPIRone  15 mg Per Tube BID  . chlorhexidine gluconate (MEDLINE KIT)  15 mL Mouth Rinse BID  . Chlorhexidine Gluconate Cloth  6 each Topical Daily  . clonazepam  1 mg Per Tube TID  . diltiazem  30 mg Per Tube Q8H  . doxepin  10 mg Per Tube QHS  . feeding supplement (OSMOLITE 1.5 CAL)  1,000 mL Per Tube Q24H  . feeding supplement (PRO-STAT SUGAR FREE 64)  30 mL Per Tube BID  . free water  100 mL Per Tube Q4H  . insulin aspart  2-6 Units Subcutaneous Q4H  . insulin glargine  18 Units Subcutaneous QHS  . ipratropium-albuterol  3 mL Nebulization Q4H  . mouth rinse  15 mL Mouth Rinse QID  . metoprolol tartrate  50 mg Per Tube BID  . montelukast  10 mg Per Tube QHS  . multivitamin  15 mL Per Tube Daily  . PARoxetine  30 mg Per Tube Daily  . polyethylene glycol  17 g Per Tube Daily  . predniSONE  40 mg Per Tube Daily  . QUEtiapine  50 mg Per Tube BID  . rOPINIRole  0.5 mg Per Tube QHS  . sodium chloride flush  10-40 mL Intracatheter Q12H   Continuous Infusions: . sodium chloride 250 mL (08/27/17 0830)  . famotidine (PEPCID) IV Stopped (08/27/17 6606)  . vancomycin Stopped (08/27/17 3016)    Time spent: >25 minutes  Deatra James, MD [facility],  Pager [phone]  If 7PM-7AM, please contact night-coverage [email or web address]   Password [email or web address]  08/27/2017, 2:01 PM

## 2017-08-27 NOTE — Plan of Care (Signed)
Remains on PRVC via Trach. Not tolerating Tfs this Am; currently on hold. Continues with IV antibiotics. No dispo of which I am aware at this time.

## 2017-08-27 NOTE — Progress Notes (Signed)
INFECTIOUS DISEASE PROGRESS NOTE  ID: Danielle Stevens is a 47 y.o. female with  Active Problems:   Hypercarbia   Acute on chronic respiratory failure with hypoxia and hypercapnia (HCC)   COPD exacerbation (HCC)   VAP (ventilator-associated pneumonia) (HCC)  Subjective: Per NSG- nausea.  Pt feels she is not at her baseline breathing.   Abtx:  Anti-infectives (From admission, onward)   Start     Dose/Rate Route Frequency Ordered Stop   08/26/17 2000  levofloxacin (LEVAQUIN) tablet 500 mg     500 mg Per Tube Daily 08/26/17 1053 08/26/17 2040   08/26/17 0845  vancomycin (VANCOCIN) IVPB 750 mg/150 ml premix     750 mg 150 mL/hr over 60 Minutes Intravenous Every 12 hours 08/26/17 0844     08/24/17 0400  vancomycin (VANCOCIN) IVPB 750 mg/150 ml premix  Status:  Discontinued     750 mg 150 mL/hr over 60 Minutes Intravenous Every 8 hours 08/23/17 1925 08/25/17 1324   08/23/17 2000  vancomycin (VANCOCIN) 1,250 mg in sodium chloride 0.9 % 250 mL IVPB     1,250 mg 166.7 mL/hr over 90 Minutes Intravenous  Once 08/23/17 1925 08/23/17 2328   08/22/17 2330  levofloxacin (LEVAQUIN) IVPB 750 mg  Status:  Discontinued     750 mg 100 mL/hr over 90 Minutes Intravenous Daily at bedtime 08/22/17 2240 08/26/17 1053      Medications:  Scheduled: . arformoterol  15 mcg Nebulization BID  . budesonide (PULMICORT) nebulizer solution  0.5 mg Nebulization BID  . busPIRone  15 mg Per Tube BID  . chlorhexidine gluconate (MEDLINE KIT)  15 mL Mouth Rinse BID  . Chlorhexidine Gluconate Cloth  6 each Topical Daily  . clonazepam  1 mg Per Tube TID  . diltiazem  30 mg Per Tube Q8H  . doxepin  10 mg Per Tube QHS  . feeding supplement (OSMOLITE 1.5 CAL)  1,000 mL Per Tube Q24H  . feeding supplement (PRO-STAT SUGAR FREE 64)  30 mL Per Tube BID  . free water  100 mL Per Tube Q4H  . insulin aspart  2-6 Units Subcutaneous Q4H  . insulin glargine  18 Units Subcutaneous QHS  . ipratropium-albuterol  3 mL  Nebulization Q4H  . mouth rinse  15 mL Mouth Rinse QID  . metoprolol tartrate  50 mg Per Tube BID  . montelukast  10 mg Per Tube QHS  . multivitamin  15 mL Per Tube Daily  . mupirocin ointment  1 application Nasal BID  . PARoxetine  30 mg Per Tube Daily  . polyethylene glycol  17 g Per Tube Daily  . predniSONE  40 mg Per Tube Daily  . QUEtiapine  50 mg Per Tube BID  . rOPINIRole  0.5 mg Per Tube QHS  . sodium chloride flush  10-40 mL Intracatheter Q12H    Objective: Vital signs in last 24 hours: Temp:  [97.4 F (36.3 C)-98.9 F (37.2 C)] 98.9 F (37.2 C) (05/01 0803) Pulse Rate:  [74-139] 113 (05/01 0700) Resp:  [10-35] 20 (05/01 0700) BP: (74-108)/(45-80) 106/62 (05/01 0700) SpO2:  [93 %-100 %] 100 % (05/01 0700) FiO2 (%):  [40 %] 40 % (05/01 0339) Weight:  [74.5 kg (164 lb 3.9 oz)] 74.5 kg (164 lb 3.9 oz) (05/01 0500)   General appearance: alert and no distress Resp: rhonchi anterior - bilateral Cardio: tachycardia GI: normal findings: bowel sounds normal and soft, non-tender Extremities: edema none  Lab Results Recent Labs    08/25/17  0410 08/26/17 0631  WBC 14.0* 12.0*  HGB 8.5* 8.0*  HCT 31.1* 29.2*  NA 137 141  K 3.9 4.3  CL 93* 95*  CO2 37* 38*  BUN 33* 25*  CREATININE 0.57 0.49   Liver Panel No results for input(s): PROT, ALBUMIN, AST, ALT, ALKPHOS, BILITOT, BILIDIR, IBILI in the last 72 hours. Sedimentation Rate No results for input(s): ESRSEDRATE in the last 72 hours. C-Reactive Protein No results for input(s): CRP in the last 72 hours.  Microbiology: Recent Results (from the past 240 hour(s))  Culture, blood (Routine x 2)     Status: Abnormal   Collection Time: 08/22/17  4:06 PM  Result Value Ref Range Status   Specimen Description BLOOD LEFT FOREARM  Final   Special Requests   Final    BOTTLES DRAWN AEROBIC AND ANAEROBIC Blood Culture adequate volume   Culture  Setup Time   Final    GRAM POSITIVE COCCI IN BOTH AEROBIC AND ANAEROBIC  BOTTLES CRITICAL RESULT CALLED TO, READ BACK BY AND VERIFIED WITH: T COLBARD,PHARMD AT 1556 08/23/17 BY L BENFIELD Performed at [facility], [place] [address] [place], [place] [address]    Culture STAPHYLOCOCCUS SPECIES (COAGULASE NEGATIVE) (A)  Final   Report Status 08/26/2017 FINAL  Final   Organism ID, Bacteria STAPHYLOCOCCUS SPECIES (COAGULASE NEGATIVE)  Final      Susceptibility   Staphylococcus species (coagulase negative) - MIC*    CIPROFLOXACIN 4 RESISTANT Resistant     ERYTHROMYCIN >=8 RESISTANT Resistant     GENTAMICIN <=0.5 SENSITIVE Sensitive     OXACILLIN >=4 RESISTANT Resistant     TETRACYCLINE <=1 SENSITIVE Sensitive     VANCOMYCIN <=0.5 SENSITIVE Sensitive     TRIMETH/SULFA <=10 SENSITIVE Sensitive     CLINDAMYCIN <=0.25 SENSITIVE Sensitive     RIFAMPIN <=0.5 SENSITIVE Sensitive     Inducible Clindamycin NEGATIVE Sensitive     * STAPHYLOCOCCUS SPECIES (COAGULASE NEGATIVE)  Blood Culture ID Panel (Reflexed)     Status: Abnormal   Collection Time: 08/22/17  4:06 PM  Result Value Ref Range Status   Enterococcus species NOT DETECTED NOT DETECTED Final   Listeria monocytogenes NOT DETECTED NOT DETECTED Final   Staphylococcus species DETECTED (A) NOT DETECTED Final    Comment: Methicillin (oxacillin) resistant coagulase negative staphylococcus. Possible blood culture contaminant (unless isolated from more than one blood culture draw or clinical case suggests pathogenicity). No antibiotic treatment is indicated for blood  culture contaminants. CRITICAL RESULT CALLED TO, READ BACK BY AND VERIFIED WITH: T COLBARD,PHARMD AT 1556 08/23/17 BY L BENFIELD    Staphylococcus aureus NOT DETECTED NOT DETECTED Final   Methicillin resistance DETECTED (A) NOT DETECTED Final    Comment: CRITICAL RESULT CALLED TO, READ BACK BY AND VERIFIED WITH: T COLBARD,PHARMD AT 1556 08/23/17 BY L BENFIELD    Streptococcus species NOT DETECTED NOT DETECTED Final   Streptococcus agalactiae NOT  DETECTED NOT DETECTED Final   Streptococcus pneumoniae NOT DETECTED NOT DETECTED Final   Streptococcus pyogenes NOT DETECTED NOT DETECTED Final   Acinetobacter baumannii NOT DETECTED NOT DETECTED Final   Enterobacteriaceae species NOT DETECTED NOT DETECTED Final   Enterobacter cloacae complex NOT DETECTED NOT DETECTED Final   Escherichia coli NOT DETECTED NOT DETECTED Final   Klebsiella oxytoca NOT DETECTED NOT DETECTED Final   Klebsiella pneumoniae NOT DETECTED NOT DETECTED Final   Proteus species NOT DETECTED NOT DETECTED Final   Serratia marcescens NOT DETECTED NOT DETECTED Final   Haemophilus influenzae NOT DETECTED NOT  DETECTED Final   Neisseria meningitidis NOT DETECTED NOT DETECTED Final   Pseudomonas aeruginosa NOT DETECTED NOT DETECTED Final   Candida albicans NOT DETECTED NOT DETECTED Final   Candida glabrata NOT DETECTED NOT DETECTED Final   Candida krusei NOT DETECTED NOT DETECTED Final   Candida parapsilosis NOT DETECTED NOT DETECTED Final   Candida tropicalis NOT DETECTED NOT DETECTED Final    Comment: Performed at [facility], [place] [address] [place], [place] [address]  MRSA PCR Screening     Status: Abnormal   Collection Time: 08/22/17  8:45 PM  Result Value Ref Range Status   MRSA by PCR POSITIVE (A) NEGATIVE Final    Comment:        The GeneXpert MRSA Assay (FDA approved for NASAL specimens only), is one component of a comprehensive MRSA colonization surveillance program. It is not intended to diagnose MRSA infection nor to guide or monitor treatment for MRSA infections. RESULT CALLED TO, READ BACK BY AND VERIFIED WITHBishop Limbo RN [id number] 08/23/17 A BROWNING Performed at [facility], [place] [address], [place], [place] [address]   Culture, blood (Routine x 2)     Status: Abnormal   Collection Time: 08/22/17  9:22 PM  Result Value Ref Range Status   Specimen Description BLOOD RIGHT HAND  Final   Special Requests   Final    BOTTLES DRAWN AEROBIC AND  ANAEROBIC Blood Culture adequate volume   Culture  Setup Time   Final    GRAM POSITIVE COCCI IN CLUSTERS IN BOTH AEROBIC AND ANAEROBIC BOTTLES CRITICAL RESULT CALLED TO, READ BACK BY AND VERIFIED WITH: B MANCHERIL,PHARMD AT 1856 08/23/17 BY L BENFIELD    Culture (A)  Final    STAPHYLOCOCCUS SPECIES (COAGULASE NEGATIVE) SUSCEPTIBILITIES PERFORMED ON PREVIOUS CULTURE WITHIN THE LAST 5 DAYS. Performed at [facility], [place] [address] [place], [place] [address]    Report Status 08/26/2017 FINAL  Final  Culture, respiratory (tracheal aspirate)     Status: None   Collection Time: 08/22/17 11:16 PM  Result Value Ref Range Status   Specimen Description TRACHEAL ASPIRATE  Final   Special Requests NONE  Final   Gram Stain   Final    ABUNDANT WBC PRESENT, PREDOMINANTLY PMN RARE SQUAMOUS EPITHELIAL CELLS PRESENT MODERATE GRAM POSITIVE RODS FEW GRAM NEGATIVE COCCI IN PAIRS RARE GRAM NEGATIVE RODS    Culture   Final    Consistent with normal respiratory flora. Performed at [facility], [place] [address] [place], [place] [address]    Report Status 08/25/2017 FINAL  Final    Studies/Results: Dg Chest Port 1 View  Result Date: 08/26/2017 CLINICAL DATA:  Respiratory distress, shortness of breath. History of asthma-COPD. Former smoker. EXAM: PORTABLE CHEST 1 VIEW COMPARISON:  Portable chest x-ray of August 22, 2017 FINDINGS: The lungs remain hyperinflated. There is increased density in the right infrahilar region more conspicuous today. The left costophrenic gutter is excluded from the study. The heart and pulmonary vascularity are normal. The porta catheter tip projects over the midportion of the SVC. IMPRESSION: Right infrahilar atelectasis or pneumonia. Underlying COPD. No CHF. The tracheostomy tube tip projects approximately 1 cm above the clavicular heads. Electronically Signed   By: David  Martinique M.D.   On: 08/26/2017 09:23     Assessment/Plan: MRSE bacteremia Suspected Port  infection Alpha-1 antitrypsin deficiency Chronic vent dependence  Total days of antibiotics: 3 vanco  Port out yesterday Will repeat her BCx Would not check  TEE Plan for 2 weeks of vanco from negative BCx         Bobby Rumpf MD, FACP Infectious Diseases (pager) [phone] [email or web address] 08/27/2017, 8:38 AM  LOS: 5 days

## 2017-08-27 NOTE — Progress Notes (Addendum)
08/27/17  0825 hrs: Patient p/w new N/V/D. Tube feeds stopped. PRN ativan given for anxiety, N/V. Dr. Saddie Benders paged at [phone] at this time. Awaiting response. Plan: restart TFs in 4 hours at 50% of previous rate, increase slowly.   0981 hrs: Dr. Saddie Benders paged again at this time. Awaiting response.  1055 hrs: Dr. Jola Schmidt at bedside; patient care concerns addressed at this time. Verbal order for C. Diff assay given.

## 2017-08-27 NOTE — Progress Notes (Signed)
1 Day Post-Op   Subjective/Chief Complaint: Awake, no complaints   Objective: Vital signs in last 24 hours: Temp:  [97.4 F (36.3 C)-98.8 F (37.1 C)] 97.4 F (36.3 C) (05/01 0352) Pulse Rate:  [74-139] 106 (05/01 0500) Resp:  [10-35] 19 (05/01 0500) BP: (74-108)/(45-80) 94/53 (05/01 0500) SpO2:  [93 %-100 %] 99 % (05/01 0500) FiO2 (%):  [40 %] 40 % (05/01 0339) Weight:  [74.5 kg (164 lb 3.9 oz)] 74.5 kg (164 lb 3.9 oz) (05/01 0500) Last BM Date: 08/27/17  Intake/Output from previous day: 04/30 0701 - 05/01 0700 In: 1470 [I.V.:650; NG/GT:620; IV Piggyback:200] Out: 200 [Urine:200] Intake/Output this shift: No intake/output data recorded.  Right chest wound - clean, dry, intact, no hematoma  Lab Results:  Recent Labs    08/25/17 0410 08/26/17 0631  WBC 14.0* 12.0*  HGB 8.5* 8.0*  HCT 31.1* 29.2*  PLT 209 190   BMET Recent Labs    08/25/17 0410 08/26/17 0631  NA 137 141  K 3.9 4.3  CL 93* 95*  CO2 37* 38*  GLUCOSE 106* 42*  BUN 33* 25*  CREATININE 0.57 0.49  CALCIUM 9.3 9.1   PT/INR No results for input(s): LABPROT, INR in the last 72 hours. ABG No results for input(s): PHART, HCO3 in the last 72 hours.  Invalid input(s): PCO2, PO2  Studies/Results: Dg Chest Port 1 View  Result Date: 08/26/2017 CLINICAL DATA:  Respiratory distress, shortness of breath. History of asthma-COPD. Former smoker. EXAM: PORTABLE CHEST 1 VIEW COMPARISON:  Portable chest x-ray of August 22, 2017 FINDINGS: The lungs remain hyperinflated. There is increased density in the right infrahilar region more conspicuous today. The left costophrenic gutter is excluded from the study. The heart and pulmonary vascularity are normal. The porta catheter tip projects over the midportion of the SVC. IMPRESSION: Right infrahilar atelectasis or pneumonia. Underlying COPD. No CHF. The tracheostomy tube tip projects approximately 1 cm above the clavicular heads. Electronically Signed   By: David  [place]  M.D.   On: 08/26/2017 09:23    Anti-infectives: Anti-infectives (From admission, onward)   Start     Dose/Rate Route Frequency Ordered Stop   08/26/17 2000  levofloxacin (LEVAQUIN) tablet 500 mg     500 mg Per Tube Daily 08/26/17 1053 08/26/17 2040   08/26/17 0845  vancomycin (VANCOCIN) IVPB 750 mg/150 ml premix     750 mg 150 mL/hr over 60 Minutes Intravenous Every 12 hours 08/26/17 0844     08/24/17 0400  vancomycin (VANCOCIN) IVPB 750 mg/150 ml premix  Status:  Discontinued     750 mg 150 mL/hr over 60 Minutes Intravenous Every 8 hours 08/23/17 1925 08/25/17 1324   08/23/17 2000  vancomycin (VANCOCIN) 1,250 mg in sodium chloride 0.9 % 250 mL IVPB     1,250 mg 166.7 mL/hr over 90 Minutes Intravenous  Once 08/23/17 1925 08/23/17 2328   08/22/17 2330  levofloxacin (LEVAQUIN) IVPB 750 mg  Status:  Discontinued     750 mg 100 mL/hr over 90 Minutes Intravenous Daily at bedtime 08/22/17 2240 08/26/17 1053      Assessment/Plan: s/p Procedure(s): REMOVAL PORT-A-CATH (Left) Further care per primary team  We will sign off for now.  No wound care needed.  Call us if any problems.   LOS: 5 days    Danielle Stevens 08/27/2017

## 2017-08-28 DIAGNOSIS — B957 Other staphylococcus as the cause of diseases classified elsewhere: Secondary | ICD-10-CM

## 2017-08-28 LAB — VANCOMYCIN, TROUGH: Vancomycin Tr: 18 ug/mL (ref 15–20)

## 2017-08-28 LAB — GLUCOSE, CAPILLARY
GLUCOSE-CAPILLARY: 95 mg/dL (ref 65–99)
GLUCOSE-CAPILLARY: 164 mg/dL — AB (ref 65–99)
Glucose-Capillary: 125 mg/dL — ABNORMAL HIGH (ref 65–99)
Glucose-Capillary: 154 mg/dL — ABNORMAL HIGH (ref 65–99)
Glucose-Capillary: 175 mg/dL — ABNORMAL HIGH (ref 65–99)

## 2017-08-28 NOTE — Progress Notes (Signed)
PT Cancellation Note  Patient Details Name: Danielle Stevens MRN: [id number] DOB: [birth year]   Cancelled Treatment:    Reason Eval/Treat Not Completed: Other (comment). Per RN, pt recently just settled as very anxious with all interactions with staff. Requesting PT hold off treatment this afternoon. Will follow-up.  [name], PT, DPT Acute Rehab Services  Pager: [phone]  [name] 08/28/2017, 4:12 PM

## 2017-08-28 NOTE — Progress Notes (Addendum)
INFECTIOUS DISEASE PROGRESS NOTE  ID: Danielle Stevens is a 47 y.o. female with  Active Problems:   Hypercarbia   Acute on chronic respiratory failure with hypoxia and hypercapnia (HCC)   COPD exacerbation (HCC)   VAP (ventilator-associated pneumonia) (HCC)  Subjective: Resting quietly.   Abtx:  Anti-infectives (From admission, onward)   Start     Dose/Rate Route Frequency Ordered Stop   08/26/17 2000  levofloxacin (LEVAQUIN) tablet 500 mg     500 mg Per Tube Daily 08/26/17 1053 08/26/17 2040   08/26/17 0845  vancomycin (VANCOCIN) IVPB 750 mg/150 ml premix     750 mg 150 mL/hr over 60 Minutes Intravenous Every 12 hours 08/26/17 0844     08/24/17 0400  vancomycin (VANCOCIN) IVPB 750 mg/150 ml premix  Status:  Discontinued     750 mg 150 mL/hr over 60 Minutes Intravenous Every 8 hours 08/23/17 1925 08/25/17 1324   08/23/17 2000  vancomycin (VANCOCIN) 1,250 mg in sodium chloride 0.9 % 250 mL IVPB     1,250 mg 166.7 mL/hr over 90 Minutes Intravenous  Once 08/23/17 1925 08/23/17 2328   08/22/17 2330  levofloxacin (LEVAQUIN) IVPB 750 mg  Status:  Discontinued     750 mg 100 mL/hr over 90 Minutes Intravenous Daily at bedtime 08/22/17 2240 08/26/17 1053      Medications:  Scheduled: . arformoterol  15 mcg Nebulization BID  . budesonide (PULMICORT) nebulizer solution  0.5 mg Nebulization BID  . busPIRone  15 mg Per Tube BID  . chlorhexidine gluconate (MEDLINE KIT)  15 mL Mouth Rinse BID  . Chlorhexidine Gluconate Cloth  6 each Topical Daily  . clonazepam  1 mg Per Tube TID  . diltiazem  30 mg Per Tube Q8H  . doxepin  10 mg Per Tube QHS  . feeding supplement (OSMOLITE 1.5 CAL)  1,000 mL Per Tube Q24H  . feeding supplement (PRO-STAT SUGAR FREE 64)  30 mL Per Tube BID  . free water  100 mL Per Tube Q4H  . insulin aspart  2-6 Units Subcutaneous Q4H  . insulin glargine  18 Units Subcutaneous QHS  . ipratropium-albuterol  3 mL Nebulization Q4H  . mouth rinse  15 mL Mouth Rinse QID    . metoprolol tartrate  50 mg Per Tube BID  . montelukast  10 mg Per Tube QHS  . multivitamin  15 mL Per Tube Daily  . PARoxetine  30 mg Per Tube Daily  . polyethylene glycol  17 g Per Tube Daily  . predniSONE  40 mg Per Tube Daily  . QUEtiapine  50 mg Per Tube BID  . rOPINIRole  0.5 mg Per Tube QHS  . sodium chloride flush  10-40 mL Intracatheter Q12H    Objective: Vital signs in last 24 hours: Temp:  [98.2 F (36.8 C)-99.6 F (37.6 C)] 98.7 F (37.1 C) (05/02 0745) Pulse Rate:  [71-126] 95 (05/02 1100) Resp:  [10-43] 21 (05/02 1100) BP: (88-119)/(48-77) 89/48 (05/02 1100) SpO2:  [95 %-100 %] 98 % (05/02 1100) FiO2 (%):  [40 %] 40 % (05/02 0854) Weight:  [73.9 kg (162 lb 14.7 oz)] 73.9 kg (162 lb 14.7 oz) (05/02 0439)   General appearance: no distress Resp: diminished breath sounds anterior - bilateral Cardio: regular rate and rhythm GI: normal findings: bowel sounds normal and soft, non-tender  Lab Results Recent Labs    08/26/17 0631  WBC 12.0*  HGB 8.0*  HCT 29.2*  NA 141  K 4.3  CL 95*  CO2 38*  BUN 25*  CREATININE 0.49   Liver Panel No results for input(s): PROT, ALBUMIN, AST, ALT, ALKPHOS, BILITOT, BILIDIR, IBILI in the last 72 hours. Sedimentation Rate No results for input(s): ESRSEDRATE in the last 72 hours. C-Reactive Protein No results for input(s): CRP in the last 72 hours.  Microbiology: Recent Results (from the past 240 hour(s))  Culture, blood (Routine x 2)     Status: Abnormal   Collection Time: 08/22/17  4:06 PM  Result Value Ref Range Status   Specimen Description BLOOD LEFT FOREARM  Final   Special Requests   Final    BOTTLES DRAWN AEROBIC AND ANAEROBIC Blood Culture adequate volume   Culture  Setup Time   Final    GRAM POSITIVE COCCI IN BOTH AEROBIC AND ANAEROBIC BOTTLES CRITICAL RESULT CALLED TO, READ BACK BY AND VERIFIED WITH: T COLBARD,PHARMD AT 1556 08/23/17 BY L BENFIELD Performed at [facility], [place] [address]  [place], [place] [address]    Culture STAPHYLOCOCCUS SPECIES (COAGULASE NEGATIVE) (A)  Final   Report Status 08/26/2017 FINAL  Final   Organism ID, Bacteria STAPHYLOCOCCUS SPECIES (COAGULASE NEGATIVE)  Final      Susceptibility   Staphylococcus species (coagulase negative) - MIC*    CIPROFLOXACIN 4 RESISTANT Resistant     ERYTHROMYCIN >=8 RESISTANT Resistant     GENTAMICIN <=0.5 SENSITIVE Sensitive     OXACILLIN >=4 RESISTANT Resistant     TETRACYCLINE <=1 SENSITIVE Sensitive     VANCOMYCIN <=0.5 SENSITIVE Sensitive     TRIMETH/SULFA <=10 SENSITIVE Sensitive     CLINDAMYCIN <=0.25 SENSITIVE Sensitive     RIFAMPIN <=0.5 SENSITIVE Sensitive     Inducible Clindamycin NEGATIVE Sensitive     * STAPHYLOCOCCUS SPECIES (COAGULASE NEGATIVE)  Blood Culture ID Panel (Reflexed)     Status: Abnormal   Collection Time: 08/22/17  4:06 PM  Result Value Ref Range Status   Enterococcus species NOT DETECTED NOT DETECTED Final   Listeria monocytogenes NOT DETECTED NOT DETECTED Final   Staphylococcus species DETECTED (A) NOT DETECTED Final    Comment: Methicillin (oxacillin) resistant coagulase negative staphylococcus. Possible blood culture contaminant (unless isolated from more than one blood culture draw or clinical case suggests pathogenicity). No antibiotic treatment is indicated for blood  culture contaminants. CRITICAL RESULT CALLED TO, READ BACK BY AND VERIFIED WITH: T COLBARD,PHARMD AT 1556 08/23/17 BY L BENFIELD    Staphylococcus aureus NOT DETECTED NOT DETECTED Final   Methicillin resistance DETECTED (A) NOT DETECTED Final    Comment: CRITICAL RESULT CALLED TO, READ BACK BY AND VERIFIED WITH: T COLBARD,PHARMD AT 1556 08/23/17 BY L BENFIELD    Streptococcus species NOT DETECTED NOT DETECTED Final   Streptococcus agalactiae NOT DETECTED NOT DETECTED Final   Streptococcus pneumoniae NOT DETECTED NOT DETECTED Final   Streptococcus pyogenes NOT DETECTED NOT DETECTED Final   Acinetobacter baumannii NOT  DETECTED NOT DETECTED Final   Enterobacteriaceae species NOT DETECTED NOT DETECTED Final   Enterobacter cloacae complex NOT DETECTED NOT DETECTED Final   Escherichia coli NOT DETECTED NOT DETECTED Final   Klebsiella oxytoca NOT DETECTED NOT DETECTED Final   Klebsiella pneumoniae NOT DETECTED NOT DETECTED Final   Proteus species NOT DETECTED NOT DETECTED Final   Serratia marcescens NOT DETECTED NOT DETECTED Final   Haemophilus influenzae NOT DETECTED NOT DETECTED Final   Neisseria meningitidis NOT DETECTED NOT DETECTED Final   Pseudomonas aeruginosa NOT DETECTED NOT DETECTED Final   Candida albicans NOT DETECTED NOT DETECTED Final  Candida glabrata NOT DETECTED NOT DETECTED Final   Candida krusei NOT DETECTED NOT DETECTED Final   Candida parapsilosis NOT DETECTED NOT DETECTED Final   Candida tropicalis NOT DETECTED NOT DETECTED Final    Comment: Performed at [facility], [place] [address], [place], [place] [address]  MRSA PCR Screening     Status: Abnormal   Collection Time: 08/22/17  8:45 PM  Result Value Ref Range Status   MRSA by PCR POSITIVE (A) NEGATIVE Final    Comment:        The GeneXpert MRSA Assay (FDA approved for NASAL specimens only), is one component of a comprehensive MRSA colonization surveillance program. It is not intended to diagnose MRSA infection nor to guide or monitor treatment for MRSA infections. RESULT CALLED TO, READ BACK BY AND VERIFIED WITHBishop Limbo RN [id number] 08/23/17 A BROWNING Performed at [facility], [place] [address] [place], [place] [address]   Culture, blood (Routine x 2)     Status: Abnormal   Collection Time: 08/22/17  9:22 PM  Result Value Ref Range Status   Specimen Description BLOOD RIGHT HAND  Final   Special Requests   Final    BOTTLES DRAWN AEROBIC AND ANAEROBIC Blood Culture adequate volume   Culture  Setup Time   Final    GRAM POSITIVE COCCI IN CLUSTERS IN BOTH AEROBIC AND ANAEROBIC BOTTLES CRITICAL RESULT CALLED TO,  READ BACK BY AND VERIFIED WITH: B MANCHERIL,PHARMD AT 1856 08/23/17 BY L BENFIELD    Culture (A)  Final    STAPHYLOCOCCUS SPECIES (COAGULASE NEGATIVE) SUSCEPTIBILITIES PERFORMED ON PREVIOUS CULTURE WITHIN THE LAST 5 DAYS. Performed at [facility], [place] [address], [place], [place] [address]    Report Status 08/26/2017 FINAL  Final  Culture, respiratory (tracheal aspirate)     Status: None   Collection Time: 08/22/17 11:16 PM  Result Value Ref Range Status   Specimen Description TRACHEAL ASPIRATE  Final   Special Requests NONE  Final   Gram Stain   Final    ABUNDANT WBC PRESENT, PREDOMINANTLY PMN RARE SQUAMOUS EPITHELIAL CELLS PRESENT MODERATE GRAM POSITIVE RODS FEW GRAM NEGATIVE COCCI IN PAIRS RARE GRAM NEGATIVE RODS    Culture   Final    Consistent with normal respiratory flora. Performed at [facility], [place] [address], [place], [place] [address]    Report Status 08/25/2017 FINAL  Final  C difficile quick scan w PCR reflex     Status: Abnormal   Collection Time: 08/27/17 12:36 PM  Result Value Ref Range Status   C Diff antigen POSITIVE (A) NEGATIVE Final   C Diff toxin NEGATIVE NEGATIVE Final   C Diff interpretation Results are indeterminate. See PCR results.  Final  C. Diff by PCR, Reflexed     Status: None   Collection Time: 08/27/17 12:36 PM  Result Value Ref Range Status   Toxigenic C. Difficile by PCR NEGATIVE NEGATIVE Final    Comment: Patient is colonized with non toxigenic C. difficile. May not need treatment unless significant symptoms are present. Performed at [facility], [place] [address], [place], [place] [address]     Studies/Results: No results found.   Assessment/Plan: MRSE bacteremia Suspected Port infection Alpha-1 antitrypsin deficiency Chronic vent dependence  Total days of antibiotics: 4 vanco  Repeat BCx sent yesterday Await results.  Plan for 2 weeks of vanco for port related bacteremia End date 09-10-17 Available as  needed.  Bobby Rumpf MD, FACP Infectious Diseases (pager) [phone] [email or web address] 08/28/2017, 11:08 AM  LOS: 6 days

## 2017-08-28 NOTE — Progress Notes (Signed)
Pharmacy Antibiotic Note  Danielle Stevens is a 47 y.o. female admitted on 08/22/2017 with MRSE bacteremia to s/p removal of port-a-cath. Pharmacy has been consulted for vancomycin dosing.  Patient is currently on vancomycin 750 mg IV Q 12 hours. An 11.5 hour vancomycin trough is therapeutic at 16.   Plan: Continue Vancomycin 750 mg IV every 12 hours. Stop date 09/10/17 per ID  Monitor renal fx   Height: 5\' 4"  (162.6 cm) Weight: 162 lb 14.7 oz (73.9 kg) IBW/kg (Calculated) : 54.7  Temp (24hrs), Avg:98.7 F (37.1 C), Min:98.2 F (36.8 C), Max:98.9 F (37.2 C)  Recent Labs  Lab 08/22/17 1614 08/22/17 1626 08/23/17 0009 08/25/17 0410 08/25/17 1115 08/26/17 0631 08/28/17 0802  WBC 44.7*  --  24.8* 14.0*  --  12.0*  --   CREATININE 0.88  --  0.61 0.57  --  0.49  --   LATICACIDVEN  --  1.23 1.5  --   --   --   --   VANCOTROUGH  --   --   --   --  31*  --  18  VANCORANDOM  --   --   --   --   --  10  --     Estimated Creatinine Clearance: 84.7 mL/min (by C-G formula based on SCr of 0.49 mg/dL).    Allergies  Allergen Reactions  . Wasp Venom Protein Shortness Of Breath  . Adhesive [Tape] Other (See Comments)    Bruises   . Coconut Oil Hives  . Latex Other (See Comments)    Bruises   . Robitussin Severe Multi-Symp [Phenylephrine-Dm-Gg-Apap] Diarrhea and Other (See Comments)    Allergic, per verbal MAR  . Shellfish-Derived Products Hives and Other (See Comments)    Allergic, per verbal MAR    Levaquin 4/26>> 4/30 Vancomycin 4/27>>  4/26 TA: normal resp flora 4/26 BCx: 2/2 CONS 4/26 BCID: MRSE 4/26 MRSA PCR: pos 5/1 cdif antigen +, tox neg 5/1 blood x 1 > sent     Thank you for allowing pharmacy to be a part of this patient's care.  Vinnie Level, PharmD., BCPS Clinical Pharmacist Clinical phone for 08/28/17 until 3:30pm: [phone] If after 3:30pm, please call main pharmacy at: [phone]

## 2017-08-28 NOTE — Progress Notes (Signed)
Nutrition Follow-up  DOCUMENTATION CODES:   Not applicable  INTERVENTION:   Tube Feeding:  Increase Osmolite 1.5 TF back to goal rate of 40 ml/hr Continue Pro-Stat 30 mL BID  NUTRITION DIAGNOSIS:   Inadequate oral intake related to inability to eat(Chronic resp failure, PEG dependant) as evidenced by NPO status.  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  At [facility] earlier today she was suddenly unresponsive and EMS was called.  4/30 Removal of Port-A-Cath 5/1 +N/V, TF held. Restarted later at 50% of previous rate  Pt remains on vent support via trach Osmolite 1.5 currently infusing at 20 ml/hr; goal rate of 40 ml/hr Free water 100 mL q 4 hours Abdomen soft, +BS, +flatus, +BM  Weight up since admission but trending back down. Net positive 4.7 L  Labs: reviewed; CBGs 95-226 Meds: MVI, prednisone  EDUCATION NEEDS:   No education needs have been identified at this time  Skin:  Skin Assessment: Skin Integrity Issues: Skin Integrity Issues:: Other (Comment) Other: MASD: buttock, groin  Last BM:  5/2  Height:   Ht Readings from Last 1 Encounters:  08/22/17 5\' 4"  (1.626 m)    Weight:   Wt Readings from Last 1 Encounters:  08/28/17 162 lb 14.7 oz (73.9 kg)    Ideal Body Weight:  54.55 kg  BMI:  Body mass index is 27.97 kg/m.  Estimated Nutritional Needs:   Kcal:  1650-1800 kcals (23-25 kcal/kg bw)  Protein:  87-100 grams (1.2-1.4 grams/kg)  Fluid:  >1.8 L fluid   Romelle Starcher MS, RD, LDN, CNSC [phone] Pager  [phone] Weekend/On-Call Pager

## 2017-08-28 NOTE — Progress Notes (Addendum)
PROGRESS NOTE    Patient: Danielle Stevens     PCP: Patient, No Pcp Per                    DOB: [birth year]            DOA: 08/22/2017 FXJ:[id number]             DOS: 08/28/2017, 12:49 PM   LOS: 6 days   Date of Service: The patient was seen and examined on 08/28/2017  Subjective:   Patient was seen and examined this morning, nursing staff present at bedside, patient had some mild agitation this morning, PRN benzodiazepine was given.  Patient is currently awake alert cooperative in no distress.   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  At [facility] she was suddenly unresponsive and EMS was called.  She had minimal tracheal secretions.  She was suctioning given several nebulizer treatments in route to the hospital for her initial blood gas showed a pH of 7.16 a PCO2 of 114 and a PO2 of 249.  She has a history of chronic respiratory failure and dysphagia, chronic trach and PEG. She was admitted to ICU, vent setting was adjusted, continuous respiratory toiletry, suctions, IV antibiotics, DuoNeb bronchodilator.  Subsequently patient was stabilized.  Awake and responsive. Was deemed stable to be transferred out of ICU on 08/26/2017.  08/26/17 - REMOVAL PORT-A-CATH (Left) -----------------------------------------------------------------------------------------------------------------------------------   Active Problems:   Hypercarbia   Acute on chronic respiratory failure with hypoxia and hypercapnia (HCC)   COPD exacerbation (HCC)   VAP (ventilator-associated pneumonia) (HCC)   Assessment & Plan:    Acute on chronic hypercarbic respiratory  -Much improved, at baseline, on the vent at her trach (vent dependent) - failure [facility]osis in  setting of mucous plugging from purulent bronchitis -S/P Hypertonic saline nebs  - Continue full ventilator support-attempt to taper down to need -Continue nebs  COPD: With acute exacerbation and purulent bronchitis. A1 antitrypsin deficient.  Prednisone to 30 mg via tube daily and taper every other day.  Continue bronchodilators (brovana, budesonide, duoneb, and PRN albuterol) DC levofloxacin  Sepsis/bacteremia /MRSE -This is an 2 out of 2 samples (4/4 bottles) No vegetation described on echo, hold off TEE for now.  MRSE bacteremia- Suspected Port infection -was removed on 08/26/2017 Repeat BCx sent 08/27/2017, Plan for 2 weeks of vanco for port related bacteremia- End date 09-10-17   Diabetes with hyperglycemia likely exacerbated by steroids Continue sliding-scale insulin dosing Maintain basal dose - (hypoglycemia overnight but was NPO? will hold lantus tonight and restart patient is stable, tolerating PEG tube feedings  Trach status Continue routine tracheostomy care Not currently a candidate for decannulation  Chronic anxiety and pain Home meds continue  Anemia hemoglobin down to 8.8 from 10.5. - No obvious signs of bleeding -Follow CBC Transfuse for HGB < 7  Severe chronic respiratory failure with hypoxemia exacerbated by COPD exacerbation and history of chronic alpha-1 antitrypsin deficiency -Pulmonary team following, the new bronchodilators -No plan to wean off vent as patient is dependent to tracheostomy -Continue tracheostomy management      DVT prophylaxis:   SCDs/compression stockings       Code Status:         Full code  Family Communication:  The above findings and plan of care has been discussed with patient  Disposition Plan: > 3 days LTAC  Consultants:   ID  Critical care team  Surgery  Pulmonary  Procedures:   Continue vent through chronic tracheostomy  Antimicrobials:   Levofloxacin 4/26>>>4/30 Vancomycin 4/27  >>   Objective: Vitals:   08/28/17 1000 08/28/17 1100 08/28/17 1130 08/28/17 1200  BP: 119/74 (!) 89/48  96/63  Pulse: (!) 126 95 87 (!) 103  Resp: (!) 42 (!) 21 (!) 21 (!) 39  Temp:      TempSrc:      SpO2: 98% 98% 98% 96%  Weight:      Height:        Intake/Output Summary (Last 24 hours) at 08/28/2017 1249 Last data filed at 08/28/2017 1200 Gross per 24 hour  Intake 781.67 ml  Output 950 ml  Net -168.33 ml   Filed Weights   08/26/17 0411 08/27/17 0500 08/28/17 0439  Weight: 76.6 kg (168 lb 14 oz) 74.5 kg (164 lb 3.9 oz) 73.9 kg (162 lb 14.7 oz)    Examination:  BP 96/63   Pulse (!) 103   Temp 98.7 F (37.1 C) (Oral)   Resp (!) 39   Ht _0  (1.626 m)   Wt 73.9 kg (162 lb 14.7 oz)   LMP  (LMP Unknown)   SpO2 96%   BMI 27.97 kg/m    Physical Exam  Constitution:  Alert, cooperative, no distress,  Psychiatric: Not agitated, following command Neck: Chronic trach in place, on the vent, functioning well HEENT: Normocephalic, PERRL, otherwise with in Normal limits  Cardio vascular:   Tachycardic S1/S2, RRR, No murmure, No Rubs or Gallops  Chest/pulmonary: Clear to auscultation bilaterally, respirations unlabored  Chest symmetric Abdomen: Positive for PEG tube soft, non-tender, non-distended, bowel sounds,no masses, no organomegaly Muscular skeletal: Limited exam - in bed, able to move all 4 extremities, Normal strength,  Neuro: Obviously unable to speak, but follows commands, cranial nerves seems to be intact, sensory motor intact Extremities: No pitting edema lower extremities, +2 pulses  Skin: Dry, warm to touch, negative for any Rashes, No open wounds      Data Reviewed: I have personally reviewed following labs and imaging studies  CBC: Recent Labs  Lab 08/22/17 1614 08/23/17 0009 08/25/17 0410 08/26/17 0631  WBC 44.7* 24.8* 14.0* 12.0*  NEUTROABS 39.3*  --   --   --   HGB 10.2* 9.1* 8.5* 8.0*  HCT 38.1 33.1* 31.1* 29.2*  MCV 87.6 85.8 84.7 84.1   PLT 566* 266 209 453   Basic Metabolic Panel: Recent Labs  Lab 08/22/17 1614 08/23/17 0009 08/24/17 0651 08/24/17 1639 08/25/17 0410 08/25/17 1640 08/26/17 0631  NA 138 138  --   --  137  --  141  K 5.3* 4.5  --   --  3.9  --  4.3  CL 90* 91*  --   --  93*  --  95*  CO2 33* 38*  --   --  37*  --  38*  GLUCOSE 204* 241*  --   --  106*  --  42*  BUN 35* 28*  --   --  33*  --  25*  CREATININE 0.88 0.61  --   --  0.57  --  0.49  CALCIUM 10.4* 9.9  --   --  9.3  --  9.1  MG 2.6* 2.3 2.3 2.3 2.0 2.0  --   PHOS  --  4.4 3.2 3.4 3.1 3.7  --    GFR: Estimated Creatinine Clearance: 84.7 mL/min (by C-G formula  based on SCr of 0.49 mg/dL). Liver Function Tests: Recent Labs  Lab 08/22/17 1614  AST 30  ALT 29  ALKPHOS 87  BILITOT 0.6  PROT 7.0  ALBUMIN 3.3*   No results for input(s): LIPASE, AMYLASE in the last 168 hours. No results for input(s): AMMONIA in the last 168 hours. Coagulation Profile: Recent Labs  Lab 08/22/17 1614  INR 1.07   Cardiac Enzymes: No results for input(s): CKTOTAL, CKMB, CKMBINDEX, TROPONINI in the last 168 hours. BNP (last 3 results) No results for input(s): PROBNP in the last 8760 hours. HbA1C: No results for input(s): HGBA1C in the last 72 hours. CBG: Recent Labs  Lab 08/27/17 1947 08/27/17 2349 08/28/17 0350 08/28/17 0814 08/28/17 1203  GLUCAP 189* 95 95 125* 164*   Sepsis Labs: Recent Labs  Lab 08/22/17 1626 08/23/17 0009 08/24/17 0651  PROCALCITON  --  0.98 0.91  LATICACIDVEN 1.23 1.5  --     Recent Results (from the past 240 hour(s))  Culture, blood (Routine x 2)     Status: Abnormal   Collection Time: 08/22/17  4:06 PM  Result Value Ref Range Status   Specimen Description BLOOD LEFT FOREARM  Final   Special Requests   Final    BOTTLES DRAWN AEROBIC AND ANAEROBIC Blood Culture adequate volume   Culture  Setup Time   Final    GRAM POSITIVE COCCI IN BOTH AEROBIC AND ANAEROBIC BOTTLES CRITICAL RESULT CALLED TO, READ BACK  BY AND VERIFIED WITH: T COLBARD,PHARMD AT 1556 08/23/17 BY L BENFIELD Performed at [facility], [place] [address], [place], [place] [address]    Culture STAPHYLOCOCCUS SPECIES (COAGULASE NEGATIVE) (A)  Final   Report Status 08/26/2017 FINAL  Final   Organism ID, Bacteria STAPHYLOCOCCUS SPECIES (COAGULASE NEGATIVE)  Final      Susceptibility   Staphylococcus species (coagulase negative) - MIC*    CIPROFLOXACIN 4 RESISTANT Resistant     ERYTHROMYCIN >=8 RESISTANT Resistant     GENTAMICIN <=0.5 SENSITIVE Sensitive     OXACILLIN >=4 RESISTANT Resistant     TETRACYCLINE <=1 SENSITIVE Sensitive     VANCOMYCIN <=0.5 SENSITIVE Sensitive     TRIMETH/SULFA <=10 SENSITIVE Sensitive     CLINDAMYCIN <=0.25 SENSITIVE Sensitive     RIFAMPIN <=0.5 SENSITIVE Sensitive     Inducible Clindamycin NEGATIVE Sensitive     * STAPHYLOCOCCUS SPECIES (COAGULASE NEGATIVE)  Blood Culture ID Panel (Reflexed)     Status: Abnormal   Collection Time: 08/22/17  4:06 PM  Result Value Ref Range Status   Enterococcus species NOT DETECTED NOT DETECTED Final   Listeria monocytogenes NOT DETECTED NOT DETECTED Final   Staphylococcus species DETECTED (A) NOT DETECTED Final    Comment: Methicillin (oxacillin) resistant coagulase negative staphylococcus. Possible blood culture contaminant (unless isolated from more than one blood culture draw or clinical case suggests pathogenicity). No antibiotic treatment is indicated for blood  culture contaminants. CRITICAL RESULT CALLED TO, READ BACK BY AND VERIFIED WITH: T COLBARD,PHARMD AT 1556 08/23/17 BY L BENFIELD    Staphylococcus aureus NOT DETECTED NOT DETECTED Final   Methicillin resistance DETECTED (A) NOT DETECTED Final    Comment: CRITICAL RESULT CALLED TO, READ BACK BY AND VERIFIED WITH: T COLBARD,PHARMD AT 1556 08/23/17 BY L BENFIELD    Streptococcus species NOT DETECTED NOT DETECTED Final   Streptococcus agalactiae NOT DETECTED NOT DETECTED Final   Streptococcus  pneumoniae NOT DETECTED NOT DETECTED Final   Streptococcus pyogenes NOT DETECTED NOT DETECTED Final   Acinetobacter baumannii  NOT DETECTED NOT DETECTED Final   Enterobacteriaceae species NOT DETECTED NOT DETECTED Final   Enterobacter cloacae complex NOT DETECTED NOT DETECTED Final   Escherichia coli NOT DETECTED NOT DETECTED Final   Klebsiella oxytoca NOT DETECTED NOT DETECTED Final   Klebsiella pneumoniae NOT DETECTED NOT DETECTED Final   Proteus species NOT DETECTED NOT DETECTED Final   Serratia marcescens NOT DETECTED NOT DETECTED Final   Haemophilus influenzae NOT DETECTED NOT DETECTED Final   Neisseria meningitidis NOT DETECTED NOT DETECTED Final   Pseudomonas aeruginosa NOT DETECTED NOT DETECTED Final   Candida albicans NOT DETECTED NOT DETECTED Final   Candida glabrata NOT DETECTED NOT DETECTED Final   Candida krusei NOT DETECTED NOT DETECTED Final   Candida parapsilosis NOT DETECTED NOT DETECTED Final   Candida tropicalis NOT DETECTED NOT DETECTED Final    Comment: Performed at [facility], [place] [address], [place], [place] [address]  MRSA PCR Screening     Status: Abnormal   Collection Time: 08/22/17  8:45 PM  Result Value Ref Range Status   MRSA by PCR POSITIVE (A) NEGATIVE Final    Comment:        The GeneXpert MRSA Assay (FDA approved for NASAL specimens only), is one component of a comprehensive MRSA colonization surveillance program. It is not intended to diagnose MRSA infection nor to guide or monitor treatment for MRSA infections. RESULT CALLED TO, READ BACK BY AND VERIFIED WITHBishop Limbo RN [id number] 08/23/17 A BROWNING Performed at [facility], [place] [address], [place], [place] [address]   Culture, blood (Routine x 2)     Status: Abnormal   Collection Time: 08/22/17  9:22 PM  Result Value Ref Range Status   Specimen Description BLOOD RIGHT HAND  Final   Special Requests   Final    BOTTLES DRAWN AEROBIC AND ANAEROBIC Blood Culture adequate volume    Culture  Setup Time   Final    GRAM POSITIVE COCCI IN CLUSTERS IN BOTH AEROBIC AND ANAEROBIC BOTTLES CRITICAL RESULT CALLED TO, READ BACK BY AND VERIFIED WITH: B MANCHERIL,PHARMD AT 1856 08/23/17 BY L BENFIELD    Culture (A)  Final    STAPHYLOCOCCUS SPECIES (COAGULASE NEGATIVE) SUSCEPTIBILITIES PERFORMED ON PREVIOUS CULTURE WITHIN THE LAST 5 DAYS. Performed at [facility], [place] [address], [place], [place] [address]    Report Status 08/26/2017 FINAL  Final  Culture, respiratory (tracheal aspirate)     Status: None   Collection Time: 08/22/17 11:16 PM  Result Value Ref Range Status   Specimen Description TRACHEAL ASPIRATE  Final   Special Requests NONE  Final   Gram Stain   Final    ABUNDANT WBC PRESENT, PREDOMINANTLY PMN RARE SQUAMOUS EPITHELIAL CELLS PRESENT MODERATE GRAM POSITIVE RODS FEW GRAM NEGATIVE COCCI IN PAIRS RARE GRAM NEGATIVE RODS    Culture   Final    Consistent with normal respiratory flora. Performed at [facility], [place] [address], [place], [place] [address]    Report Status 08/25/2017 FINAL  Final  C difficile quick scan w PCR reflex     Status: Abnormal   Collection Time: 08/27/17 12:36 PM  Result Value Ref Range Status   C Diff antigen POSITIVE (A) NEGATIVE Final   C Diff toxin NEGATIVE NEGATIVE Final   C Diff interpretation Results are indeterminate. See PCR results.  Final  C. Diff by PCR, Reflexed     Status: None   Collection Time: 08/27/17 12:36 PM  Result Value  Ref Range Status   Toxigenic C. Difficile by PCR NEGATIVE NEGATIVE Final    Comment: Patient is colonized with non toxigenic C. difficile. May not need treatment unless significant symptoms are present. Performed at [facility], [place] [address] [place], [place] [address]       Radiology Studies: No results found.  Scheduled Meds: . arformoterol  15 mcg Nebulization BID  . budesonide (PULMICORT) nebulizer solution  0.5 mg Nebulization BID  . busPIRone  15 mg Per Tube  BID  . chlorhexidine gluconate (MEDLINE KIT)  15 mL Mouth Rinse BID  . Chlorhexidine Gluconate Cloth  6 each Topical Daily  . clonazepam  1 mg Per Tube TID  . diltiazem  30 mg Per Tube Q8H  . doxepin  10 mg Per Tube QHS  . feeding supplement (OSMOLITE 1.5 CAL)  1,000 mL Per Tube Q24H  . feeding supplement (PRO-STAT SUGAR FREE 64)  30 mL Per Tube BID  . free water  100 mL Per Tube Q4H  . insulin aspart  2-6 Units Subcutaneous Q4H  . insulin glargine  18 Units Subcutaneous QHS  . ipratropium-albuterol  3 mL Nebulization Q4H  . mouth rinse  15 mL Mouth Rinse QID  . metoprolol tartrate  50 mg Per Tube BID  . montelukast  10 mg Per Tube QHS  . multivitamin  15 mL Per Tube Daily  . PARoxetine  30 mg Per Tube Daily  . polyethylene glycol  17 g Per Tube Daily  . predniSONE  40 mg Per Tube Daily  . QUEtiapine  50 mg Per Tube BID  . rOPINIRole  0.5 mg Per Tube QHS  . sodium chloride flush  10-40 mL Intracatheter Q12H   Continuous Infusions: . sodium chloride 10 mL/hr at 08/28/17 1200  . famotidine (PEPCID) IV Stopped (08/28/17 1105)  . vancomycin Stopped (08/28/17 1006)    Time spent: >35 minutes  Deatra James, MD [facility],  Pager [phone]  If 7PM-7AM, please contact night-coverage [email or web address]   Password [facility]  08/28/2017, 12:49 PM

## 2017-08-28 NOTE — Progress Notes (Signed)
[facility] continues to work with dtr to try and get paperwork together for [facility] so that patient can transfer there at time of DC  CSW will continue to follow  Burna Sis, LCSW Clinical Social Worker [phone]

## 2017-08-29 ENCOUNTER — Encounter (HOSPITAL_COMMUNITY): Payer: Self-pay | Admitting: Family Medicine

## 2017-08-29 DIAGNOSIS — I959 Hypotension, unspecified: Secondary | ICD-10-CM | POA: Diagnosis present

## 2017-08-29 LAB — CREATININE, SERUM
CREATININE: 0.52 mg/dL (ref 0.44–1.00)
GFR calc non Af Amer: >60 mL/min (ref >60)

## 2017-08-29 LAB — GLUCOSE, CAPILLARY
GLUCOSE-CAPILLARY: 130 mg/dL — AB (ref 65–99)
GLUCOSE-CAPILLARY: 143 mg/dL — AB (ref 65–99)
Glucose-Capillary: 117 mg/dL — ABNORMAL HIGH (ref 65–99)
Glucose-Capillary: 98 mg/dL (ref 65–99)
Glucose-Capillary: 214 mg/dL — ABNORMAL HIGH (ref 65–99)
Glucose-Capillary: 178 mg/dL — ABNORMAL HIGH (ref 65–99)

## 2017-08-29 MED ORDER — SODIUM CHLORIDE 0.9 % IV BOLUS
250.0000 mL | Freq: Once | INTRAVENOUS | Status: AC
  Administered 2017-08-29: 250 mL via INTRAVENOUS

## 2017-08-29 MED ORDER — PREDNISONE 20 MG PO TABS
30.0000 mg | ORAL_TABLET | Freq: Every day | ORAL | Status: DC
  Administered 2017-08-29 – 2017-08-31 (×3): 30 mg
  Filled 2017-08-29 (×2): qty 1

## 2017-08-29 MED ORDER — LORAZEPAM 2 MG/ML IJ SOLN
1.0000 mg | INTRAMUSCULAR | Status: DC | PRN
  Administered 2017-09-05 – 2017-09-25 (×33): 1 mg via INTRAVENOUS
  Filled 2017-08-29 (×33): qty 1

## 2017-08-29 MED ORDER — RISAQUAD PO CAPS
1.0000 | ORAL_CAPSULE | Freq: Every day | ORAL | Status: DC
  Administered 2017-08-29 – 2017-09-25 (×27): 1 via ORAL
  Filled 2017-08-29 (×34): qty 1

## 2017-08-29 MED ORDER — METOPROLOL TARTRATE 25 MG/10 ML ORAL SUSPENSION
12.5000 mg | Freq: Two times a day (BID) | ORAL | Status: DC
Start: 2017-08-29 — End: 2017-09-06
  Administered 2017-08-29 – 2017-09-06 (×8): 12.5 mg
  Filled 2017-08-29 (×9): qty 10

## 2017-08-29 NOTE — Plan of Care (Signed)
No changes to this patient's plan of care this AM. Still awaiting long-term placement. Patient remains stable on vent via trach.

## 2017-08-29 NOTE — Consult Note (Signed)
WOC Nurse wound consult note Reason for Consult:full thickness wounds to the coccygeal area Wound type: Moisture, friction Pressure Injury POA: NA Measurement: three linear areas, the longest of which is 2cm x 0.5cm x 0.2cm Wound EZM:OQHU Drainage (amount, consistency, odor) scant serous Periwound:erythematous, macerated Dressing procedure/placement/frequency: I have provided Nursing today with guidance via the Orders for turning and reposition to ensure time spent in the supine position is minimized and side to side turning is in place.  Additionally, one one underpad is to be used beneath the patient as more that that will negate the therapeutic benefits of her mattress replacement with low air loss feature. There is some seepage of liquid feces around the bowel management device (FlexiSeal) and this could be contributing to the skin injury at the apex of the gluteal cleft/coccygeal area.  I will add a small piece of calcium alginate to the open area daily and PRN soiling and ask Nursing to ensure the silicone foam dressing covers this. A prophylactic dressing is in place for prevention of sacral skin injury. There is no skin injury to the bilateral heels.  I will add guidance to float heels to the Orders.  WOC nursing team will not follow, but will remain available to this patient, the nursing and medical teams.  Please re-consult if needed. Thanks, Ladona Mow, MSN, RN, GNP, [place]  Pager# [phone]

## 2017-08-29 NOTE — Progress Notes (Signed)
Patient seen for trach team follow up.  All needed equipment at the bedside.  No education needed at this time.  Will continue to follow for progression.  Patient with chronic trach.  Will follow as needed.

## 2017-08-29 NOTE — Progress Notes (Signed)
PROGRESS NOTE    Patient: Danielle Stevens     PCP: Patient, No Pcp Per                    DOB: 1970/12/07            DOA: 08/22/2017 [id number]             DOS: 08/29/2017, 2:42 PM   LOS: 7 days   Date of Service: The patient was seen and examined on 08/29/2017  Subjective:   Patient was seen and examined this morning, awake alert oriented, on a vent via trach no changes mildly hypotensive.  The Negative for any new focal neurological findings.  Nursing staff present at bedside. No major issues overnight per nursing staff.  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  At [facility] she was suddenly unresponsive and EMS was called.  She had minimal tracheal secretions.  She was suctioning given several nebulizer treatments in route to the hospital for her initial blood gas showed a pH of 7.16 a PCO2 of 114 and a PO2 of 249.  She has a history of chronic respiratory failure and dysphagia, chronic trach and PEG. She was admitted to ICU, vent setting was adjusted, continuous respiratory toiletry, suctions, IV antibiotics, DuoNeb bronchodilator.  Subsequently patient was stabilized.  Awake and responsive. Was deemed stable to be transferred out of ICU on 08/26/2017.  08/26/17 - REMOVAL PORT-A-CATH (Left) -----------------------------------------------------------------------------------------------------------------------------------   Active Problems:   Hypercarbia   Acute on chronic respiratory failure with hypoxia and hypercapnia (HCC)   COPD exacerbation (HCC)   VAP (ventilator-associated pneumonia) (HCC)   Assessment & Plan:    Acute on chronic hypercarbic respiratory  -Much improved, at baseline, on the vent at her trach (vent dependent) - failure [facility] with worsening acidosis in setting of mucous plugging from purulent bronchitis -S/P Hypertonic saline nebs  - Continue full ventilator support-attempt to taper down to need -Continue nebs  COPD: With acute exacerbation and purulent bronchitis. A1 antitrypsin deficient.  Prednisone to 30 mg via tube daily and taper every other day.  Continue bronchodilators (brovana, budesonide, duoneb, and PRN albuterol) DC levofloxacin  Sepsis/bacteremia /MRSE -This is an 2 out of 2 samples (4/4 bottles) No vegetation described on echo, hold off TEE for now.  MRSE bacteremia- Suspected Port infection -was removed on 08/26/2017 Repeat BCx sent 08/27/2017, Plan for 2 weeks of vanco for port related bacteremia- End date 09-10-17   Diabetes with hyperglycemia likely exacerbated by steroids Continue sliding-scale insulin dosing Maintain basal dose - (hypoglycemia overnight but was NPO? will hold lantus tonight and restart patient is stable, tolerating PEG tube feedings  Trach status Continue routine tracheostomy care Not currently a candidate for decannulation  Chronic anxiety and pain Home meds continue  Anemia hemoglobin down to 8.8 from 10.5. - No obvious signs of bleeding -Follow CBC Transfuse for HGB < 7  Severe chronic respiratory failure with hypoxemia exacerbated by COPD exacerbation and history of chronic alpha-1 antitrypsin deficiency -Pulmonary team following, the new bronchodilators -No plan to wean off vent as patient is dependent to tracheostomy -Continue tracheostomy management   Hypotension -Monitoring closely -1 dose of 250 mg of IV normal saline, anticipating as needed twice fluid resuscitation -Currently stable   DVT prophylaxis:   SCDs/compression stockings       Code Status:         Full code  Family Communication:  The above findings and plan of care has been discussed with patient   Disposition Plan: > 3 days  LTAC - per case manager/social worker  patient has used all her LTAC days, pending [facility] Medicaid paperwork           Consultants:   ID  Critical care team  Surgery  Pulmonary  Procedures:   Continue vent through chronic tracheostomy  Antimicrobials:   Levofloxacin 4/26>>>4/30 Vancomycin 4/27 >>   Objective: Vitals:   08/29/17 1155 08/29/17 1200 08/29/17 1300 08/29/17 1400  BP: 105/67 99/63 116/89 93/61  Pulse: 94 93 (!) 126 98  Resp: 16 (!) 21 (!) 27 12  Temp:      TempSrc:      SpO2: 96% 98% 96% 99%  Weight:      Height:        Intake/Output Summary (Last 24 hours) at 08/29/2017 1442 Last data filed at 08/29/2017 1400 Gross per 24 hour  Intake 2570 ml  Output 1100 ml  Net 1470 ml   Filed Weights   08/27/17 0500 08/28/17 0439 08/29/17 0629  Weight: 74.5 kg (164 lb 3.9 oz) 73.9 kg (162 lb 14.7 oz) 74.8 kg (164 lb 14.5 oz)    Examination:  BP 93/61   Pulse 98   Temp 98.5 F (36.9 C) (Oral)   Resp 12   Ht '5\' 4"'$  (1.626 m)   Wt 74.8 kg (164 lb 14.5 oz)   LMP  (LMP Unknown)   SpO2 99%   BMI 28.31 kg/m    Physical Exam  Constitution: Awake alert in no acute distress Psychiatric: Mild agitation anxiety earlier this morning currently stable  Neck: Chronic trach on the vent, no changes minimal secretion HEENT: Normocephalic, PERRL, otherwise with in Normal limits  Cardio vascular:   Tachycardic S1/S2, RRR, No murmure, No Rubs or Gallops  Chest/pulmonary: Clear to auscultation bilaterally, respirations unlabored  Chest symmetric Abdomen: Positive for PEG tube soft, non-tender, non-distended, bowel sounds,no masses, no organomegaly Muscular skeletal: Limited exam - in bed, able to move all 4 extremities, Normal strength,  Neuro: Obviously unable to speak, but follows commands, cranial nerves seems to be intact, sensory motor intact Extremities: No pitting edema lower extremities, +2 pulses  Skin: Dry, warm to touch, negative for any Rashes, No open wounds      Data Reviewed: I have personally  reviewed following labs and imaging studies  CBC: Recent Labs  Lab 08/22/17 1614 08/23/17 0009 08/25/17 0410 08/26/17 0631  WBC 44.7* 24.8* 14.0* 12.0*  NEUTROABS 39.3*  --   --   --   HGB 10.2* 9.1* 8.5* 8.0*  HCT 38.1 33.1* 31.1* 29.2*  MCV 87.6 85.8 84.7 84.1  PLT 566* 266 209 469   Basic Metabolic Panel: Recent Labs  Lab 08/22/17 1614 08/23/17 0009 08/24/17 0651 08/24/17 1639 08/25/17 0410 08/25/17 1640 08/26/17 0631 08/29/17 0547  NA 138 138  --   --  137  --  141  --   K 5.3* 4.5  --   --  3.9  --  4.3  --   CL 90* 91*  --   --  93*  --  95*  --   CO2 33* 38*  --   --  37*  --  38*  --   GLUCOSE 204* 241*  --   --  106*  --  42*  --   BUN 35* 28*  --   --  33*  --  25*  --   CREATININE 0.88 0.61  --   --  0.57  --  0.49 0.52  CALCIUM 10.4* 9.9  --   --  9.3  --  9.1  --   MG 2.6* 2.3 2.3 2.3 2.0 2.0  --   --   PHOS  --  4.4 3.2 3.4 3.1 3.7  --   --    GFR: Estimated Creatinine Clearance: 85.1 mL/min (by C-G formula based on SCr of 0.52 mg/dL). Liver Function Tests: Recent Labs  Lab 08/22/17 1614  AST 30  ALT 29  ALKPHOS 87  BILITOT 0.6  PROT 7.0  ALBUMIN 3.3*   No results for input(s): LIPASE, AMYLASE in the last 168 hours. No results for input(s): AMMONIA in the last 168 hours. Coagulation Profile: Recent Labs  Lab 08/22/17 1614  INR 1.07   Cardiac Enzymes: No results for input(s): CKTOTAL, CKMB, CKMBINDEX, TROPONINI in the last 168 hours. BNP (last 3 results) No results for input(s): PROBNP in the last 8760 hours. HbA1C: No results for input(s): HGBA1C in the last 72 hours. CBG: Recent Labs  Lab 08/28/17 1955 08/29/17 0017 08/29/17 0334 08/29/17 0725 08/29/17 1110  GLUCAP 175* 130* 117* 98 143*   Sepsis Labs: Recent Labs  Lab 08/22/17 1626 08/23/17 0009 08/24/17 0651  PROCALCITON  --  0.98 0.91  LATICACIDVEN 1.23 1.5  --     Recent Results (from the past 240 hour(s))  Culture, blood (Routine x 2)     Status: Abnormal    Collection Time: 08/22/17  4:06 PM  Result Value Ref Range Status   Specimen Description BLOOD LEFT FOREARM  Final   Special Requests   Final    BOTTLES DRAWN AEROBIC AND ANAEROBIC Blood Culture adequate volume   Culture  Setup Time   Final    GRAM POSITIVE COCCI IN BOTH AEROBIC AND ANAEROBIC BOTTLES CRITICAL RESULT CALLED TO, READ BACK BY AND VERIFIED WITH: T COLBARD,PHARMD AT 1556 08/23/17 BY L BENFIELD Performed at [facility], [place] [address] [place], [place] [address]    Culture STAPHYLOCOCCUS SPECIES (COAGULASE NEGATIVE) (A)  Final   Report Status 08/26/2017 FINAL  Final   Organism ID, Bacteria STAPHYLOCOCCUS SPECIES (COAGULASE NEGATIVE)  Final      Susceptibility   Staphylococcus species (coagulase negative) - MIC*    CIPROFLOXACIN 4 RESISTANT Resistant     ERYTHROMYCIN >=8 RESISTANT Resistant     GENTAMICIN <=0.5 SENSITIVE Sensitive     OXACILLIN >=4 RESISTANT Resistant     TETRACYCLINE <=1 SENSITIVE Sensitive     VANCOMYCIN <=0.5 SENSITIVE Sensitive     TRIMETH/SULFA <=10 SENSITIVE Sensitive     CLINDAMYCIN <=0.25 SENSITIVE Sensitive     RIFAMPIN <=0.5 SENSITIVE Sensitive     Inducible Clindamycin NEGATIVE Sensitive     * STAPHYLOCOCCUS SPECIES (COAGULASE NEGATIVE)  Blood Culture ID Panel (Reflexed)     Status: Abnormal   Collection Time: 08/22/17  4:06 PM  Result Value Ref Range Status   Enterococcus species NOT DETECTED NOT DETECTED Final   Listeria monocytogenes NOT DETECTED NOT DETECTED Final   Staphylococcus species DETECTED (A) NOT DETECTED Final    Comment: Methicillin (oxacillin) resistant coagulase negative staphylococcus. Possible blood culture contaminant (unless isolated from more than one blood culture draw or clinical case suggests pathogenicity). No antibiotic treatment is indicated for blood  culture contaminants. CRITICAL RESULT CALLED TO, READ BACK BY AND VERIFIED WITH: T COLBARD,PHARMD AT 1556 08/23/17 BY L BENFIELD    Staphylococcus  aureus NOT  DETECTED NOT DETECTED Final   Methicillin resistance DETECTED (A) NOT DETECTED Final    Comment: CRITICAL RESULT CALLED TO, READ BACK BY AND VERIFIED WITH: T COLBARD,PHARMD AT 1556 08/23/17 BY L BENFIELD    Streptococcus species NOT DETECTED NOT DETECTED Final   Streptococcus agalactiae NOT DETECTED NOT DETECTED Final   Streptococcus pneumoniae NOT DETECTED NOT DETECTED Final   Streptococcus pyogenes NOT DETECTED NOT DETECTED Final   Acinetobacter baumannii NOT DETECTED NOT DETECTED Final   Enterobacteriaceae species NOT DETECTED NOT DETECTED Final   Enterobacter cloacae complex NOT DETECTED NOT DETECTED Final   Escherichia coli NOT DETECTED NOT DETECTED Final   Klebsiella oxytoca NOT DETECTED NOT DETECTED Final   Klebsiella pneumoniae NOT DETECTED NOT DETECTED Final   Proteus species NOT DETECTED NOT DETECTED Final   Serratia marcescens NOT DETECTED NOT DETECTED Final   Haemophilus influenzae NOT DETECTED NOT DETECTED Final   Neisseria meningitidis NOT DETECTED NOT DETECTED Final   Pseudomonas aeruginosa NOT DETECTED NOT DETECTED Final   Candida albicans NOT DETECTED NOT DETECTED Final   Candida glabrata NOT DETECTED NOT DETECTED Final   Candida krusei NOT DETECTED NOT DETECTED Final   Candida parapsilosis NOT DETECTED NOT DETECTED Final   Candida tropicalis NOT DETECTED NOT DETECTED Final    Comment: Performed at [facility], [place]. [address] [place], [place] [address]  MRSA PCR Screening     Status: Abnormal   Collection Time: 08/22/17  8:45 PM  Result Value Ref Range Status   MRSA by PCR POSITIVE (A) NEGATIVE Final    Comment:        The GeneXpert MRSA Assay (FDA approved for NASAL specimens only), is one component of a comprehensive MRSA colonization surveillance program. It is not intended to diagnose MRSA infection nor to guide or monitor treatment for MRSA infections. RESULT CALLED TO, READ BACK BY AND VERIFIED WITHBishop Limbo RN [id number] 08/23/17 A BROWNING Performed  at [facility], [place] [address] [place], [place] [address]   Culture, blood (Routine x 2)     Status: Abnormal   Collection Time: 08/22/17  9:22 PM  Result Value Ref Range Status   Specimen Description BLOOD RIGHT HAND  Final   Special Requests   Final    BOTTLES DRAWN AEROBIC AND ANAEROBIC Blood Culture adequate volume   Culture  Setup Time   Final    GRAM POSITIVE COCCI IN CLUSTERS IN BOTH AEROBIC AND ANAEROBIC BOTTLES CRITICAL RESULT CALLED TO, READ BACK BY AND VERIFIED WITH: B MANCHERIL,PHARMD AT 1856 08/23/17 BY L BENFIELD    Culture (A)  Final    STAPHYLOCOCCUS SPECIES (COAGULASE NEGATIVE) SUSCEPTIBILITIES PERFORMED ON PREVIOUS CULTURE WITHIN THE LAST 5 DAYS. Performed at [facility], [place] [address], [place], [place] [address]    Report Status 08/26/2017 FINAL  Final  Culture, respiratory (tracheal aspirate)     Status: None   Collection Time: 08/22/17 11:16 PM  Result Value Ref Range Status   Specimen Description TRACHEAL ASPIRATE  Final   Special Requests NONE  Final   Gram Stain   Final    ABUNDANT WBC PRESENT, PREDOMINANTLY PMN RARE SQUAMOUS EPITHELIAL CELLS PRESENT MODERATE GRAM POSITIVE RODS FEW GRAM NEGATIVE COCCI IN PAIRS RARE GRAM NEGATIVE RODS    Culture   Final    Consistent with normal respiratory flora. Performed at [facility], [place] [address] [place], [place] [address]    Report Status 08/25/2017 FINAL  Final  Culture, blood (Routine X 2) w Reflex to ID Panel     Status: None (Preliminary result)   Collection Time: 08/27/17  9:06 AM  Result Value Ref Range Status   Specimen Description BLOOD RIGHT HAND  Final   Special Requests   Final    BOTTLES DRAWN AEROBIC ONLY Blood Culture results may not be optimal due to an inadequate volume of blood received in culture bottles   Culture   Final    NO GROWTH 2 DAYS Performed at [facility], [place] [address] [place], [place] [address]    Report Status PENDING  Incomplete  C difficile  quick scan w PCR reflex     Status: Abnormal   Collection Time: 08/27/17 12:36 PM  Result Value Ref Range Status   C Diff antigen POSITIVE (A) NEGATIVE Final   C Diff toxin NEGATIVE NEGATIVE Final   C Diff interpretation Results are indeterminate. See PCR results.  Final  C. Diff by PCR, Reflexed     Status: None   Collection Time: 08/27/17 12:36 PM  Result Value Ref Range Status   Toxigenic C. Difficile by PCR NEGATIVE NEGATIVE Final    Comment: Patient is colonized with non toxigenic C. difficile. May not need treatment unless significant symptoms are present. Performed at [facility], [place] [address] [place], [place] [address]       Radiology Studies: No results found.  Scheduled Meds: . acidophilus  1 capsule Oral Daily  . arformoterol  15 mcg Nebulization BID  . budesonide (PULMICORT) nebulizer solution  0.5 mg Nebulization BID  . busPIRone  15 mg Per Tube BID  . chlorhexidine gluconate (MEDLINE KIT)  15 mL Mouth Rinse BID  . Chlorhexidine Gluconate Cloth  6 each Topical Daily  . clonazepam  1 mg Per Tube TID  . diltiazem  30 mg Per Tube Q8H  . doxepin  10 mg Per Tube QHS  . feeding supplement (OSMOLITE 1.5 CAL)  1,000 mL Per Tube Q24H  . feeding supplement (PRO-STAT SUGAR FREE 64)  30 mL Per Tube BID  . free water  100 mL Per Tube Q4H  . insulin aspart  2-6 Units Subcutaneous Q4H  . insulin glargine  18 Units Subcutaneous QHS  . ipratropium-albuterol  3 mL Nebulization Q4H  . mouth rinse  15 mL Mouth Rinse QID  . metoprolol tartrate  12.5 mg Per Tube BID  . montelukast  10 mg Per Tube QHS  . multivitamin  15 mL Per Tube Daily  . PARoxetine  30 mg Per Tube Daily  . polyethylene glycol  17 g Per Tube Daily  . predniSONE  30 mg Per Tube Daily  . QUEtiapine  50 mg Per Tube BID  . sodium chloride flush  10-40 mL Intracatheter Q12H   Continuous Infusions: . sodium chloride 10 mL/hr at 08/29/17 0500  . famotidine (PEPCID) IV Stopped (08/29/17 2094)  . vancomycin  Stopped (08/29/17 7096)    Time spent: >35 minutes  Deatra James, MD [facility],  Pager [phone]  If 7PM-7AM, please contact night-coverage [email or web address]   Password [email or web address]  08/29/2017, 2:42 PM

## 2017-08-29 NOTE — Progress Notes (Signed)
Physical Therapy Treatment Patient Details Name: Danielle Stevens MRN: [id number] DOB: [birth year] Today's Date: 08/29/2017    History of Present Illness Pt is a 47 y.o. female with alpha-1 antitrypsin deficiency and has ventilator dependent respiratory failure, admitted from [facility] on 08/22/17 due to sudden unresponsiveness. Found to have worsening acidosis in setting of mucous plug from purulent bronchitis; also with MRSE bacteremia and hypotension. PMH includes anxiety, asthma, COPD, DM, emphysema lung, HTN. Of note, recently admitted 05/16/17-06/03/17 for COPD exacerbation requiring intubation; tracheostomy done later with d/c to SN[facility].    PT Comments    Pt c/o significant fatigued this session. Agreeable to supine LE therex and sitting EOB. Upon moving, noted flexiseal leak; pt able to roll R/L multiple times with minA, dependent for pericare. Fatigued after this declining EOB mobility. Responded well to quick stretch PNF technique to engage BLE hip/knee flexion and extension against moderate resistance. Pt with less anxiety this session. Will continue to follow acutely.   Follow Up Recommendations  SNF     Equipment Recommendations  None recommended by PT    Recommendations for Other Services       Precautions / Restrictions Precautions Precautions: Fall;Other (comment) Precaution Comments: trach/vent, peg tube, flexiseal, high anxiety Restrictions Weight Bearing Restrictions: No    Mobility  Bed Mobility Overal bed mobility: Needs Assistance Bed Mobility: Rolling Rolling: Min assist         General bed mobility comments: Pt rolled R/L multiple times due to leak in flexiseal, able to do so with use of hand rail, cues, and minA to initiate movement. Dependent for pericare. MaxA+2 to scoot up in bed, pt able to assist with BLEs and UEs pulling on rails   Transfers                    Ambulation/Gait                 Stairs              Wheelchair Mobility    Modified Rankin (Stroke Patients Only)       Balance                                            Cognition Arousal/Alertness: Awake/alert Behavior During Therapy: Anxious Overall Cognitive Status: Difficult to assess                                 General Comments: Pt nodding yes/no and mouthing responses to questions appropriately. Follows commands      Exercises General Exercises - Lower Extremity Ankle Circles/Pumps: AROM;Both;20 reps;Supine Heel Slides: AROM;Both;20 reps;Supine Other Exercises Other Exercises: Pt responded well to PNF quick stretch of BLEs for hip/knee flex extension; able to push against moderate force    General Comments        Pertinent Vitals/Pain Pain Assessment: No/denies pain    Home Living                      Prior Function            PT Goals (current goals can now be found in the care plan section) Acute Rehab PT Goals Patient Stated Goal: not stated PT Goal Formulation: With patient Time For Goal Achievement: 09/08/17 Potential to Achieve Goals: Fair  Progress towards PT goals: Not progressing toward goals - comment(Limited by fatigue)    Frequency    Min 2X/week      PT Plan Current plan remains appropriate    Co-evaluation              AM-PAC PT "6 Clicks" Daily Activity  Outcome Measure  Difficulty turning over in bed (including adjusting bedclothes, sheets and blankets)?: Unable Difficulty moving from lying on back to sitting on the side of the bed? : Unable Difficulty sitting down on and standing up from a chair with arms (e.g., wheelchair, bedside commode, etc,.)?: Unable Help needed moving to and from a bed to chair (including a wheelchair)?: A Lot Help needed walking in hospital room?: Total Help needed climbing 3-5 steps with a railing? : Total 6 Click Score: 7    End of Session Equipment Utilized During Treatment:  Oxygen Activity Tolerance: Patient limited by fatigue Patient left: in bed;with call bell/phone within reach;with nursing/sitter in room Nurse Communication: Mobility status PT Visit Diagnosis: Other abnormalities of gait and mobility (R26.89);Muscle weakness (generalized) (M62.81)     Time: 1610-9604 PT Time Calculation (min) (ACUTE ONLY): 28 min  Charges:  $Therapeutic Activity: 8-22 mins                    G Codes:      [name], PT, DPT Acute Rehab Services  Pager: [phone]  [name] 08/29/2017, 5:15 PM

## 2017-08-30 DIAGNOSIS — R0603 Acute respiratory distress: Secondary | ICD-10-CM

## 2017-08-30 LAB — GLUCOSE, CAPILLARY
GLUCOSE-CAPILLARY: 122 mg/dL — AB (ref 65–99)
GLUCOSE-CAPILLARY: 114 mg/dL — AB (ref 65–99)
GLUCOSE-CAPILLARY: 105 mg/dL — AB (ref 65–99)
GLUCOSE-CAPILLARY: 128 mg/dL — AB (ref 65–99)
GLUCOSE-CAPILLARY: 178 mg/dL — AB (ref 65–99)
Glucose-Capillary: 152 mg/dL — ABNORMAL HIGH (ref 65–99)
Glucose-Capillary: 171 mg/dL — ABNORMAL HIGH (ref 65–99)
Glucose-Capillary: 68 mg/dL (ref 65–99)

## 2017-08-30 MED ORDER — FAMOTIDINE 40 MG/5ML PO SUSR
20.0000 mg | Freq: Two times a day (BID) | ORAL | Status: DC
  Administered 2017-08-30 – 2017-09-25 (×52): 20 mg
  Filled 2017-08-30 (×52): qty 2.5

## 2017-08-30 MED ORDER — DEXTROSE 50 % IV SOLN
25.0000 mL | Freq: Once | INTRAVENOUS | Status: AC
  Administered 2017-08-30: 25 mL via INTRAVENOUS

## 2017-08-30 MED ORDER — DEXTROSE 50 % IV SOLN
INTRAVENOUS | Status: AC
  Filled 2017-08-30: qty 50

## 2017-08-30 NOTE — Progress Notes (Signed)
Hypoglycemic Event  CBG: 68  Treatment: 1/2 amp D 50  Symptoms: None  Follow-up CBG: Time: 0130 CBG Result: 122  Possible Reasons for Event: Unknown  Comments/MD notified:    Fannie Alomar E Monje

## 2017-08-30 NOTE — Plan of Care (Signed)
Patient often refuses Q2 turns, range of motion, and physical therapy. This RN explained the importance of mobility and how to prevent skin breakdown. Patient verbalized understanding but continues to refuse.

## 2017-08-30 NOTE — Progress Notes (Signed)
PROGRESS NOTE    Danielle Stevens  KZS:[id number] DOB: May 30, 1970 DOA: 08/22/2017 PCP: Patient, No Pcp Per    Brief Narrative:  47 year old female who presented with unresponsiveness.  Patient does have the significant past medical history of alpha-1 antitrypsin deficiency, ventilator dependent respiratory failure.  He is a resident from a long term acute care facility, where he was found unresponsive, due to acute hypercapnic respiratory failure.  On initial physical examination, he was awake and alert, heart S1-S2 present rhythmic, her lungs had metrical air movement bilaterally, on invasive mechanical ventilation, peak pressures in the 30s with a tidal volume of 380 mL and a plateau pressure of 28, his abdomen protuberant, nontender nondistended, no lower extremity edema.   He was admitted to the hospital with a working diagnosis of acute hypercapnic respiratory failure due to suspected plugging of the ventilatory circuit.  Assessment & Plan:   Active Problems:   Hypercarbia   Acute on chronic respiratory failure with hypoxia and hypercapnia (HCC)   COPD exacerbation (HCC)   VAP (ventilator-associated pneumonia) (HCC)   Hypotension  1.  Acute on chronic hypercapnic respiratory failure. Patient tolerating will mechanical ventilation mandatory mode, no dyspnea or chest pain. Continue trach care per protocol. Currently not tolerating spontaneous mode of ventilation.   2.  COPD exacerbation in the setting of alpha-1 antitrypsin deficiency. Continue aggressive bronchodilator therapy, systemic steroids. Will continue budesonide and arfomoterol.   3.  MRSA bacteremia due to indwelling central venous catheter infection. Will continue antibiotic therapy with IV Vancomycin,   4.  Type 2 diabetes mellitus. Will continue glucose cover and monitoring with insulin sliding scale, basal insuline with glargine 18 units at night.   5.  Hypertension. Will continue diltiazem and metoprolol.  6.  Chronic  multifactorial anemia. Hb and Hct stable, will continue to follow on cell count in am.   7. Depression. Will continue clonazepam, quetiapine, buspirone, as needed lorazepam.    DVT prophylaxis: enoxaparin   Code Status: full Family Communication: no family at the bedside Disposition Plan: ltac   Consultants:     Procedures:     Antimicrobials:      Subjective: Patient on invasive mechanical ventilation, does not complain of dyspnea or chest pain, no nausea or vomiting.   Objective: Vitals:   08/30/17 0600 08/30/17 0603 08/30/17 0700 08/30/17 0800  BP: (!) 87/55 (!) 94/58 (!) 88/57 117/74  Pulse: 87 90 84 (!) 115  Resp: (!) 21 (!) 22 16 (!) 22  Temp:      TempSrc:      SpO2: 99% 99% 98% 98%  Weight:      Height:        Intake/Output Summary (Last 24 hours) at 08/30/2017 0906 Last data filed at 08/30/2017 0800 Gross per 24 hour  Intake 2481.17 ml  Output 1120 ml  Net 1361.17 ml   Filed Weights   08/28/17 0439 08/29/17 0629 08/30/17 0500  Weight: 73.9 kg (162 lb 14.7 oz) 74.8 kg (164 lb 14.5 oz) 76 kg (167 lb 8.8 oz)    Examination:   General: deconditioned. Neurology: Awake and alert, non focal  E ENT: no pallor, no icterus, oral mucosa moist/ trach in place.  Cardiovascular: No JVD. S1-S2 present, rhythmic, no gallops, rubs, or murmurs. No lower extremity edema. Pulmonary: preserved breath sounds bilaterally, adequate air movement, no wheezing, rhonchi or rales. Gastrointestinal. Abdomen distended with no organomegaly, non tender, no rebound or guarding Skin. No rashes Musculoskeletal: no joint deformities     Data  Reviewed: I have personally reviewed following labs and imaging studies  CBC: Recent Labs  Lab 08/25/17 0410 08/26/17 0631  WBC 14.0* 12.0*  HGB 8.5* 8.0*  HCT 31.1* 29.2*  MCV 84.7 84.1  PLT 209 379   Basic Metabolic Panel: Recent Labs  Lab 08/24/17 0651 08/24/17 1639 08/25/17 0410 08/25/17 1640 08/26/17 0631 08/29/17 0547    NA  --   --  137  --  141  --   K  --   --  3.9  --  4.3  --   CL  --   --  93*  --  95*  --   CO2  --   --  37*  --  38*  --   GLUCOSE  --   --  106*  --  42*  --   BUN  --   --  33*  --  25*  --   CREATININE  --   --  0.57  --  0.49 0.52  CALCIUM  --   --  9.3  --  9.1  --   MG 2.3 2.3 2.0 2.0  --   --   PHOS 3.2 3.4 3.1 3.7  --   --    GFR: Estimated Creatinine Clearance: 85.8 mL/min (by C-G formula based on SCr of 0.52 mg/dL). Liver Function Tests: No results for input(s): AST, ALT, ALKPHOS, BILITOT, PROT, ALBUMIN in the last 168 hours. No results for input(s): LIPASE, AMYLASE in the last 168 hours. No results for input(s): AMMONIA in the last 168 hours. Coagulation Profile: No results for input(s): INR, PROTIME in the last 168 hours. Cardiac Enzymes: No results for input(s): CKTOTAL, CKMB, CKMBINDEX, TROPONINI in the last 168 hours. BNP (last 3 results) No results for input(s): PROBNP in the last 8760 hours. HbA1C: No results for input(s): HGBA1C in the last 72 hours. CBG: Recent Labs  Lab 08/29/17 2042 08/30/17 0056 08/30/17 0130 08/30/17 0451 08/30/17 0735  GLUCAP 178* 68 122* 114* 105*   Lipid Profile: No results for input(s): CHOL, HDL, LDLCALC, TRIG, CHOLHDL, LDLDIRECT in the last 72 hours. Thyroid Function Tests: No results for input(s): TSH, T4TOTAL, FREET4, T3FREE, THYROIDAB in the last 72 hours. Anemia Panel: No results for input(s): VITAMINB12, FOLATE, FERRITIN, TIBC, IRON, RETICCTPCT in the last 72 hours.    Radiology Studies: I have reviewed all of the imaging during this hospital visit personally     Scheduled Meds: . acidophilus  1 capsule Oral Daily  . arformoterol  15 mcg Nebulization BID  . budesonide (PULMICORT) nebulizer solution  0.5 mg Nebulization BID  . busPIRone  15 mg Per Tube BID  . chlorhexidine gluconate (MEDLINE KIT)  15 mL Mouth Rinse BID  . Chlorhexidine Gluconate Cloth  6 each Topical Daily  . clonazepam  1 mg Per Tube  TID  . diltiazem  30 mg Per Tube Q8H  . doxepin  10 mg Per Tube QHS  . feeding supplement (OSMOLITE 1.5 CAL)  1,000 mL Per Tube Q24H  . feeding supplement (PRO-STAT SUGAR FREE 64)  30 mL Per Tube BID  . free water  100 mL Per Tube Q4H  . insulin aspart  2-6 Units Subcutaneous Q4H  . insulin glargine  18 Units Subcutaneous QHS  . ipratropium-albuterol  3 mL Nebulization Q4H  . mouth rinse  15 mL Mouth Rinse QID  . metoprolol tartrate  12.5 mg Per Tube BID  . montelukast  10 mg Per Tube QHS  . multivitamin  15 mL Per Tube Daily  . PARoxetine  30 mg Per Tube Daily  . polyethylene glycol  17 g Per Tube Daily  . predniSONE  30 mg Per Tube Daily  . QUEtiapine  50 mg Per Tube BID  . sodium chloride flush  10-40 mL Intracatheter Q12H   Continuous Infusions: . sodium chloride Stopped (08/30/17 0007)  . famotidine (PEPCID) IV 20 mg (08/30/17 0904)  . vancomycin 750 mg (08/30/17 0851)     LOS: 8 days        Dondra Rhett Gerome Apley, MD [facility] Pager [phone]

## 2017-08-31 LAB — CBC WITH DIFFERENTIAL/PLATELET
BASOS ABS: 0 10*3/uL (ref 0.0–0.1)
Basophils Relative: 0 %
EOS PCT: 2 %
Eosinophils Absolute: 0.3 10*3/uL (ref 0.0–0.7)
HEMATOCRIT: 28.6 % — AB (ref 36.0–46.0)
Hemoglobin: 7.8 g/dL — ABNORMAL LOW (ref 12.0–15.0)
Lymphocytes Relative: 11 %
Lymphs Abs: 1.6 10*3/uL (ref 0.7–4.0)
MCH: 23.6 pg — ABNORMAL LOW (ref 26.0–34.0)
MCHC: 27.3 g/dL — AB (ref 30.0–36.0)
MCV: 86.4 fL (ref 78.0–100.0)
MONO ABS: 1 10*3/uL (ref 0.1–1.0)
Monocytes Relative: 7 %
NEUTROS PCT: 80 %
Neutro Abs: 11.7 10*3/uL — ABNORMAL HIGH (ref 1.7–7.7)
Platelets: 156 10*3/uL (ref 150–400)
RBC: 3.31 MIL/uL — AB (ref 3.87–5.11)
RDW: 21.2 % — AB (ref 11.5–15.5)
WBC: 14.6 10*3/uL — AB (ref 4.0–10.5)

## 2017-08-31 LAB — BASIC METABOLIC PANEL
ANION GAP: 8 (ref 5–15)
BUN: 16 mg/dL (ref 6–20)
CALCIUM: 8.5 mg/dL — AB (ref 8.9–10.3)
CO2: 34 mmol/L — AB (ref 22–32)
CREATININE: 0.43 mg/dL — AB (ref 0.44–1.00)
Chloride: 97 mmol/L — ABNORMAL LOW (ref 101–111)
GFR calc non Af Amer: >60 mL/min (ref >60)
Glucose, Bld: 82 mg/dL (ref 65–99)
Potassium: 4 mmol/L (ref 3.5–5.1)
SODIUM: 139 mmol/L (ref 135–145)

## 2017-08-31 LAB — GLUCOSE, CAPILLARY
GLUCOSE-CAPILLARY: 83 mg/dL (ref 65–99)
GLUCOSE-CAPILLARY: 95 mg/dL (ref 65–99)
GLUCOSE-CAPILLARY: 152 mg/dL — AB (ref 65–99)
Glucose-Capillary: 127 mg/dL — ABNORMAL HIGH (ref 65–99)
Glucose-Capillary: 203 mg/dL — ABNORMAL HIGH (ref 65–99)

## 2017-08-31 LAB — VANCOMYCIN, TROUGH: Vancomycin Tr: 17 ug/mL (ref 15–20)

## 2017-08-31 NOTE — Progress Notes (Signed)
PROGRESS NOTE    Chrisanne Loose  WUJ:[id number] DOB: 02/13/1971 DOA: 08/22/2017 PCP: Patient, No Pcp Per    Brief Narrative:  47 year old female who presented with unresponsiveness.  Patient does have the significant past medical history of alpha-1 antitrypsin deficiency, ventilator dependent respiratory failure.  He is a resident from a long term acute care facility, where he was found unresponsive, due to acute hypercapnic respiratory failure.  On initial physical examination, he was awake and alert, heart S1-S2 present rhythmic, her lungs had metrical air movement bilaterally, on invasive mechanical ventilation, peak pressures in the 30s with a tidal volume of 380 mL and a plateau pressure of 28, his abdomen protuberant, nontender nondistended, no lower extremity edema.   He was admitted to the hospital with a working diagnosis of acute hypercapnic respiratory failure due to suspected plugging of the ventilatory circuit.   Assessment & Plan:   Active Problems:   Hypercarbia   Acute on chronic respiratory failure with hypoxia and hypercapnia (HCC)   COPD exacerbation (HCC)   VAP (ventilator-associated pneumonia) (HCC)   Hypotension   1.  Acute on chronic hypercapnic respiratory failure. Continue mechanical ventilation mandatory mode, tolerating well with no dyspnea. Trach care per protocol. Plan to return to LTAC, to continue respiratory therapy care, and eventually liberation of mechanical ventilation.   2.  COPD exacerbation in the setting of alpha-1 antitrypsin deficiency. Onaggressive bronchodilator therapy, will discontinue systemic steroids. Continue budesonide and arfomoterol. No wheezing on lung examination.   3.  MRSA bacteremia due to indwelling central venous catheter infection. On IV Vancomycin, Port has been removed. Will need continue IV antibiotic therapy until 09/11/2016, before another port can be placed. Patient on once weekly Prolastin for alpha 1 antitrypsin  deficiency.   4.  Type 2 diabetes mellitus. Glucose cover and monitoring with insulin sliding scale, basal insuline with glargine 18 units at night. Capillary glucose 152, 83, 95, 127, tolerating well tube feedings.    5.  Hypertension. Tolerating well diltiazem and metoprolol, MAP above 65 mmHg.   6.  Chronic multifactorial anemia. Stable with HB at 7,8 and hct at 28, will continue close monitoring, no indication for prbc transfusion.   7. Depression. On clonazepam, quetiapine, buspirone, as needed lorazepam. No confusion or agitation.    DVT prophylaxis: enoxaparin   Code Status: full Family Communication: no family at the bedside Disposition Plan: ltac   Consultants:     Procedures:     Antimicrobials:     Subjective: Patient with no dyspnea or chest pain, no nausea or vomiting. Interested in returning to [facility]. Concerned about port, she gets weekly IV prolastin.   Objective: Vitals:   08/31/17 0500 08/31/17 0513 08/31/17 0600 08/31/17 0700  BP: 114/68  113/89 112/65  Pulse: 100  (!) 103 (!) 110  Resp: '20  20 19  '$ Temp:      TempSrc:      SpO2: 100% 99% 100% 99%  Weight:      Height:        Intake/Output Summary (Last 24 hours) at 08/31/2017 0754 Last data filed at 08/31/2017 0600 Gross per 24 hour  Intake 1190 ml  Output 1130 ml  Net 60 ml   Filed Weights   08/29/17 0629 08/30/17 0500 08/31/17 0402  Weight: 74.8 kg (164 lb 14.5 oz) 76 kg (167 lb 8.8 oz) 77.5 kg (170 lb 13.7 oz)    Examination:   General: deconditioned.  Neurology: Awake and alert, non focal  E ENT: mild pallor,  no icterus, oral mucosa moist/ trach in place Cardiovascular: No JVD. S1-S2 present, rhythmic, no gallops, rubs, or murmurs. Trace lower extremity edema. Pulmonary: positive breath sounds bilaterally, adequate air movement, no wheezing, rhonchi or rales. Gastrointestinal. Abdomen protuberant, no organomegaly, non tender, no rebound or guarding/ peg tube in place.  Skin.  No rashes Musculoskeletal: no joint deformities     Data Reviewed: I have personally reviewed following labs and imaging studies  CBC: Recent Labs  Lab 08/25/17 0410 08/26/17 0631 08/31/17 0241  WBC 14.0* 12.0* 14.6*  NEUTROABS  --   --  11.7*  HGB 8.5* 8.0* 7.8*  HCT 31.1* 29.2* 28.6*  MCV 84.7 84.1 86.4  PLT 209 190 101   Basic Metabolic Panel: Recent Labs  Lab 08/24/17 1639 08/25/17 0410 08/25/17 1640 08/26/17 0631 08/29/17 0547 08/31/17 0241  NA  --  137  --  141  --  139  K  --  3.9  --  4.3  --  4.0  CL  --  93*  --  95*  --  97*  CO2  --  37*  --  38*  --  34*  GLUCOSE  --  106*  --  42*  --  82  BUN  --  33*  --  25*  --  16  CREATININE  --  0.57  --  0.49 0.52 0.43*  CALCIUM  --  9.3  --  9.1  --  8.5*  MG 2.3 2.0 2.0  --   --   --   PHOS 3.4 3.1 3.7  --   --   --    GFR: Estimated Creatinine Clearance: 86.6 mL/min (A) (by C-G formula based on SCr of 0.43 mg/dL (L)). Liver Function Tests: No results for input(s): AST, ALT, ALKPHOS, BILITOT, PROT, ALBUMIN in the last 168 hours. No results for input(s): LIPASE, AMYLASE in the last 168 hours. No results for input(s): AMMONIA in the last 168 hours. Coagulation Profile: No results for input(s): INR, PROTIME in the last 168 hours. Cardiac Enzymes: No results for input(s): CKTOTAL, CKMB, CKMBINDEX, TROPONINI in the last 168 hours. BNP (last 3 results) No results for input(s): PROBNP in the last 8760 hours. HbA1C: No results for input(s): HGBA1C in the last 72 hours. CBG: Recent Labs  Lab 08/30/17 1127 08/30/17 1508 08/30/17 1954 08/30/17 2348 08/31/17 0354  GLUCAP 128* 178* 171* 152* 83   Lipid Profile: No results for input(s): CHOL, HDL, LDLCALC, TRIG, CHOLHDL, LDLDIRECT in the last 72 hours. Thyroid Function Tests: No results for input(s): TSH, T4TOTAL, FREET4, T3FREE, THYROIDAB in the last 72 hours. Anemia Panel: No results for input(s): VITAMINB12, FOLATE, FERRITIN, TIBC, IRON, RETICCTPCT in  the last 72 hours.    Radiology Studies: I have reviewed all of the imaging during this hospital visit personally     Scheduled Meds: . acidophilus  1 capsule Oral Daily  . arformoterol  15 mcg Nebulization BID  . budesonide (PULMICORT) nebulizer solution  0.5 mg Nebulization BID  . busPIRone  15 mg Per Tube BID  . chlorhexidine gluconate (MEDLINE KIT)  15 mL Mouth Rinse BID  . Chlorhexidine Gluconate Cloth  6 each Topical Daily  . clonazepam  1 mg Per Tube TID  . diltiazem  30 mg Per Tube Q8H  . doxepin  10 mg Per Tube QHS  . famotidine  20 mg Per Tube BID  . feeding supplement (OSMOLITE 1.5 CAL)  1,000 mL Per Tube Q24H  . feeding supplement (  PRO-STAT SUGAR FREE 64)  30 mL Per Tube BID  . free water  100 mL Per Tube Q4H  . insulin aspart  2-6 Units Subcutaneous Q4H  . insulin glargine  18 Units Subcutaneous QHS  . ipratropium-albuterol  3 mL Nebulization Q4H  . mouth rinse  15 mL Mouth Rinse QID  . metoprolol tartrate  12.5 mg Per Tube BID  . montelukast  10 mg Per Tube QHS  . multivitamin  15 mL Per Tube Daily  . PARoxetine  30 mg Per Tube Daily  . polyethylene glycol  17 g Per Tube Daily  . predniSONE  30 mg Per Tube Daily  . QUEtiapine  50 mg Per Tube BID  . sodium chloride flush  10-40 mL Intracatheter Q12H   Continuous Infusions: . sodium chloride Stopped (08/30/17 0007)  . vancomycin Stopped (08/30/17 2315)     LOS: 9 days        Mauricio Gerome Apley, MD [facility] Pager [phone]

## 2017-08-31 NOTE — Progress Notes (Signed)
Pharmacy Antibiotic Note  Danielle Stevens is a 47 y.o. female admitted on 08/22/2017 with MRSE bacteremia to s/p removal of port-a-cath. Pharmacy has been consulted for vancomycin dosing.  Patient is currently on vancomycin 750 mg IV Q 12 hours. Vancomycin trough remains therapeutic  Plan: Continue Vancomycin 750 mg IV every 12 hours. Stop date 09/10/17 per ID  Monitor renal fx   Height: 5\' 4"  (162.6 cm) Weight: 170 lb 13.7 oz (77.5 kg) IBW/kg (Calculated) : 54.7  Temp (24hrs), Avg:98.4 F (36.9 C), Min:97.8 F (36.6 C), Max:99.1 F (37.3 C)  Recent Labs  Lab 08/25/17 0410  08/26/17 0631 08/28/17 0802 08/29/17 0547 08/31/17 0241 08/31/17 2116  WBC 14.0*  --  12.0*  --   --  14.6*  --   CREATININE 0.57  --  0.49  --  0.52 0.43*  --   VANCOTROUGH  --    < >  --  18  --   --  17  VANCORANDOM  --   --  10  --   --   --   --    < > = values in this interval not displayed.    Estimated Creatinine Clearance: 86.6 mL/min (A) (by C-G formula based on SCr of 0.43 mg/dL (L)).    Allergies  Allergen Reactions  . Wasp Venom Protein Shortness Of Breath  . Adhesive [Tape] Other (See Comments)    Bruises   . Coconut Oil Hives  . Latex Other (See Comments)    Bruises   . Robitussin Severe Multi-Symp [Phenylephrine-Dm-Gg-Apap] Diarrhea and Other (See Comments)    Allergic, per verbal MAR  . Shellfish-Derived Products Hives and Other (See Comments)    Allergic, per verbal MAR    Levaquin 4/26>> 4/30 Vancomycin 4/27>>  4/26 TA: normal resp flora 4/26 BCx: 2/2 CONS 4/26 BCID: MRSE 4/26 MRSA PCR: pos 5/1 cdif antigen +, tox neg 5/1 blood x 1 > sent     Thank you for allowing pharmacy to be a part of this patient's care.  Talbert Cage, PharmD

## 2017-09-01 ENCOUNTER — Inpatient Hospital Stay: Payer: Self-pay

## 2017-09-01 LAB — GLUCOSE, CAPILLARY
GLUCOSE-CAPILLARY: 113 mg/dL — AB (ref 65–99)
GLUCOSE-CAPILLARY: 100 mg/dL — AB (ref 65–99)
GLUCOSE-CAPILLARY: 105 mg/dL — AB (ref 65–99)
GLUCOSE-CAPILLARY: 123 mg/dL — AB (ref 65–99)
Glucose-Capillary: 130 mg/dL — ABNORMAL HIGH (ref 65–99)
Glucose-Capillary: 136 mg/dL — ABNORMAL HIGH (ref 65–99)
Glucose-Capillary: 139 mg/dL — ABNORMAL HIGH (ref 65–99)

## 2017-09-01 LAB — CBC WITH DIFFERENTIAL/PLATELET
Basophils Absolute: 0 10*3/uL (ref 0.0–0.1)
Basophils Relative: 0 %
EOS ABS: 0.4 10*3/uL (ref 0.0–0.7)
Eosinophils Relative: 3 %
HEMATOCRIT: 28.5 % — AB (ref 36.0–46.0)
Hemoglobin: 7.3 g/dL — ABNORMAL LOW (ref 12.0–15.0)
LYMPHS ABS: 1.4 10*3/uL (ref 0.7–4.0)
Lymphocytes Relative: 11 %
MCH: 22.2 pg — AB (ref 26.0–34.0)
MCHC: 25.6 g/dL — AB (ref 30.0–36.0)
MCV: 86.6 fL (ref 78.0–100.0)
MONO ABS: 0.9 10*3/uL (ref 0.1–1.0)
Monocytes Relative: 7 %
Neutro Abs: 10.3 10*3/uL — ABNORMAL HIGH (ref 1.7–7.7)
Neutrophils Relative %: 79 %
Platelets: 228 10*3/uL (ref 150–400)
RBC: 3.29 MIL/uL — AB (ref 3.87–5.11)
RDW: 21.2 % — ABNORMAL HIGH (ref 11.5–15.5)
Smear Review: ADEQUATE
WBC: 13 10*3/uL — AB (ref 4.0–10.5)

## 2017-09-01 LAB — BASIC METABOLIC PANEL
ANION GAP: 8 (ref 5–15)
BUN: 14 mg/dL (ref 6–20)
CALCIUM: 8.7 mg/dL — AB (ref 8.9–10.3)
CHLORIDE: 98 mmol/L — AB (ref 101–111)
CO2: 36 mmol/L — AB (ref 22–32)
Creatinine, Ser: 0.51 mg/dL (ref 0.44–1.00)
GFR calc non Af Amer: >60 mL/min (ref >60)
Glucose, Bld: 123 mg/dL — ABNORMAL HIGH (ref 65–99)
Potassium: 3.7 mmol/L (ref 3.5–5.1)
SODIUM: 142 mmol/L (ref 135–145)

## 2017-09-01 LAB — CULTURE, BLOOD (ROUTINE X 2): Culture: NO GROWTH

## 2017-09-01 MED ORDER — INSULIN ASPART 100 UNIT/ML ~~LOC~~ SOLN: 0.0000 [IU] | Freq: Every day | SUBCUTANEOUS | Status: DC

## 2017-09-01 MED ORDER — ENOXAPARIN SODIUM 40 MG/0.4ML ~~LOC~~ SOLN
40.0000 mg | SUBCUTANEOUS | Status: DC
  Administered 2017-09-01 – 2017-09-10 (×10): 40 mg via SUBCUTANEOUS
  Filled 2017-09-01 (×10): qty 0.4

## 2017-09-01 MED ORDER — CHLORHEXIDINE GLUCONATE CLOTH 2 % EX PADS
6.0000 | MEDICATED_PAD | Freq: Every day | CUTANEOUS | Status: DC
  Administered 2017-09-01 – 2017-09-04 (×3): 6 via TOPICAL

## 2017-09-01 MED ORDER — IPRATROPIUM-ALBUTEROL 0.5-2.5 (3) MG/3ML IN SOLN
3.0000 mL | Freq: Four times a day (QID) | RESPIRATORY_TRACT | Status: DC
  Administered 2017-09-01 – 2017-09-08 (×29): 3 mL via RESPIRATORY_TRACT
  Filled 2017-09-01 (×29): qty 3

## 2017-09-01 MED ORDER — SODIUM CHLORIDE 0.9% FLUSH: 10.0000 mL | INTRAVENOUS | Status: DC | PRN

## 2017-09-01 MED ORDER — INSULIN ASPART 100 UNIT/ML ~~LOC~~ SOLN
0.0000 [IU] | Freq: Three times a day (TID) | SUBCUTANEOUS | Status: DC
  Administered 2017-09-03 – 2017-09-04 (×2): 2 [IU] via SUBCUTANEOUS
  Administered 2017-09-04 – 2017-09-06 (×5): 1 [IU] via SUBCUTANEOUS
  Administered 2017-09-07 (×2): 2 [IU] via SUBCUTANEOUS
  Administered 2017-09-08 – 2017-09-15 (×5): 1 [IU] via SUBCUTANEOUS
  Administered 2017-09-15: 2 [IU] via SUBCUTANEOUS

## 2017-09-01 NOTE — Progress Notes (Signed)
Physical Therapy Treatment Patient Details Name: Danielle Stevens MRN: [id number] DOB: 08/12/1970 Today's Date: 09/01/2017    History of Present Illness Pt is a 47 y.o. female with alpha-1 antitrypsin deficiency and has ventilator dependent respiratory failure, admitted from [facility] on 08/22/17 due to sudden unresponsiveness. Found to have worsening acidosis in setting of mucous plug from purulent bronchitis; also with MRSE bacteremia and hypotension. PMH includes anxiety, asthma, COPD, DM, emphysema lung, HTN. Of note, recently admitted 05/16/17-06/03/17 for COPD exacerbation requiring intubation; tracheostomy done later with d/c to SNF [facility].     PT Comments    Continuing work on functional mobility and activity tolerance;  Able to transfer OOB to chair today with 2 person assist; heavy moderate assist needed for steadiness, and she did not tolerate much time in upright standing; Good progress -- I added a pre-gait standing goal    Follow Up Recommendations  SNF     Equipment Recommendations  None recommended by PT    Recommendations for Other Services       Precautions / Restrictions Precautions Precautions: Fall;Other (comment) Precaution Comments: trach/vent, peg tube, flexiseal, high anxiety    Mobility  Bed Mobility Overal bed mobility: Needs Assistance       Supine to sit: +2 for safety/equipment;Min assist     General bed mobility comments: Min assist for safety, used rails; nice bed mobility  Transfers Overall transfer level: Needs assistance Equipment used: 2 person hand held assist Transfers: Stand Pivot Transfers   Stand pivot transfers: Mod assist;+2 physical assistance;+2 safety/equipment       General transfer comment: Nervous to get up, required gentle encouragement; 2 person assist to power up and turn to chair; did not tolerate much time in standing  Ambulation/Gait                 Stairs             Wheelchair Mobility     Modified Rankin (Stroke Patients Only)       Balance     Sitting balance-Leahy Scale: Fair(approaching good) Sitting balance - Comments: UE support                                    Cognition Arousal/Alertness: Awake/alert Behavior During Therapy: WFL for tasks assessed/performed;Anxious Overall Cognitive Status: Difficult to assess                                 General Comments: Pt nodding yes/no and mouthing responses to questions appropriately. Follows commands      Exercises      General Comments        Pertinent Vitals/Pain Pain Assessment: Faces Faces Pain Scale: Hurts a little bit Pain Location: bottom Pain Descriptors / Indicators: Sore Pain Intervention(s): Repositioned    Home Living                      Prior Function            PT Goals (current goals can now be found in the care plan section) Acute Rehab PT Goals Patient Stated Goal: not stated PT Goal Formulation: With patient Time For Goal Achievement: 09/08/17 Potential to Achieve Goals: Fair Progress towards PT goals: Progressing toward goals    Frequency    Min 2X/week      PT Plan  Current plan remains appropriate    Co-evaluation              AM-PAC PT "6 Clicks" Daily Activity  Outcome Measure  Difficulty turning over in bed (including adjusting bedclothes, sheets and blankets)?: A Little Difficulty moving from lying on back to sitting on the side of the bed? : A Lot Difficulty sitting down on and standing up from a chair with arms (e.g., wheelchair, bedside commode, etc,.)?: Unable Help needed moving to and from a bed to chair (including a wheelchair)?: A Lot Help needed walking in hospital room?: Total Help needed climbing 3-5 steps with a railing? : Total 6 Click Score: 10    End of Session Equipment Utilized During Treatment: Oxygen Activity Tolerance: Patient limited by fatigue Patient left: in chair;with call  bell/phone within reach Nurse Communication: Mobility status PT Visit Diagnosis: Other abnormalities of gait and mobility (R26.89);Muscle weakness (generalized) (M62.81)     Time: 9604-5409 PT Time Calculation (min) (ACUTE ONLY): 32 min  Charges:  $Therapeutic Activity: 23-37 mins                    G Codes:       Van Clines, PT  Acute Rehabilitation Services Pager [phone] Office [phone]    Levi Aland 09/01/2017, 1:06 PM

## 2017-09-01 NOTE — Progress Notes (Signed)
Peripherally Inserted Central Catheter/Midline Placement  The IV Nurse has discussed with the patient and/or persons authorized to consent for the patient, the purpose of this procedure and the potential benefits and risks involved with this procedure.  The benefits include less needle sticks, lab draws from the catheter, and the patient may be discharged home with the catheter. Risks include, but not limited to, infection, bleeding, blood clot (thrombus formation), and puncture of an artery; nerve damage and irregular heartbeat and possibility to perform a PICC exchange if needed/ordered by physician.  Alternatives to this procedure were also discussed.  Bard Power PICC patient education guide, fact sheet on infection prevention and patient information card has been provided to patient /or left at bedside.    PICC/Midline Placement Documentation  PICC Single Lumen 09/01/17 PICC Left Basilic 42 cm 0 cm (Active)  Indication for Insertion or Continuance of Line Prolonged intravenous therapies 09/01/2017  1:45 PM  Exposed Catheter (cm) 0 cm 09/01/2017  1:45 PM  Site Assessment Clean;Dry;Intact 09/01/2017  1:45 PM  Line Status Flushed;Blood return noted;Saline locked 09/01/2017  1:45 PM  Dressing Type Transparent 09/01/2017  1:45 PM  Dressing Status Clean;Dry;Intact 09/01/2017  1:45 PM  Dressing Change Due 09/08/17 09/01/2017  1:45 PM       Audrie Gallus 09/01/2017, 4:01 PM

## 2017-09-01 NOTE — Plan of Care (Signed)
Lost PIV again this AM IV team consulted. Continues to be unable to wean. Participated with PT this AM. Held AM dose of metoprolol this AM due to soft Bps overnight. Otherwise, no changes in physical exam.

## 2017-09-01 NOTE — Progress Notes (Signed)
[place] TEAM 1 - Stepdown/ICU TEAM  Danielle Stevens  [id number]:[id number] DOB: 1970-11-26 DOA: 08/22/2017 PCP: Patient, No Pcp Per    Brief Narrative:  17 year oldfemale LTACH resident w/ a hx of alpha-1 antitrypsin deficiency and chronic ventilator dependent respiratory failure who presented unresponsive due to acute hypercapnic respiratory failure felt to be due to mucous plugging.  By the time she arrived in the ED EMS has actually essentially corrected her acute resp decline.      Significant Events: 4/26 admit 4/30 Prot-a-cath removed in OR  Subjective: The patient is sitting up in bed receiving chest percussive therapy.  She appears comfortable.  She is alert interactive and at times jovial.  She denies current shortness of breath but she does not yet feel that her breathing is back to her normal.  She denies chest pain or abdominal pain.  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 Port removed 4/30 - continue IV antibiotic therapy through 09/10/17 as per ID suggestions   DM2 CBG reasonably controlled at this time  Hypertension Blood pressure well controlled at this time  Chronic multifactorial normocytic anemia No evidence of acute blood loss - continue to follow trend  Depression Continue clonazepam, quetiapine, buspirone, as needed lorazepam   DVT prophylaxis: lovenox  Code Status: FULL CODE Family Communication: no family present at time of exam  Disposition Plan: d/c to [facility] for chronic vent wean (pt does not wish to return to  [facility])  Consultants:  PCCM ID Gen Surgery   Antimicrobials:  Levofloxacin 4/26 > 4/30 Vancomycin 4/27 >  Objective: Blood pressure 111/72, pulse (!) 126, temperature 98.2 F (36.8 C), temperature source Oral, resp. rate (!) 27, height _0  (1.626 m), weight 77.2 kg (170 lb 3.1 oz), SpO2 95 %.  Intake/Output Summary (Last 24 hours) at 09/01/2017 1132 Last data filed at 09/01/2017 1000 Gross per 24 hour  Intake 1420 ml  Output 1350 ml  Net 70 ml   Filed Weights   08/30/17 0500 08/31/17 0402 09/01/17 0248  Weight: 76 kg (167 lb 8.8 oz) 77.5 kg (170 lb 13.7 oz) 77.2 kg (170 lb 3.1 oz)    Examination: General: No acute respiratory distress Lungs: Clear to auscultation bilaterally - minor scattered wheezing  Cardiovascular: Regular rate and rhythm without murmur gallop or rub  Abdomen: Nontender, nondistended, soft, bowel sounds positive, no rebound, no ascites, no appreciable mass Extremities: 1+ diffuse edema   CBC: Recent Labs  Lab 08/26/17 0631 08/31/17 0241 09/01/17 0343  WBC 12.0* 14.6* 13.0*  NEUTROABS  --  11.7* 10.3*  HGB 8.0* 7.8* 7.3*  HCT 29.2* 28.6* 28.5*  MCV 84.1 86.4 86.6  PLT 190 156 270   Basic Metabolic Panel: Recent Labs  Lab 08/25/17 1640  08/26/17 0631 08/29/17 0547 08/31/17 0241 09/01/17 0343  NA  --   --  141  --  139 142  K  --   --  4.3  --  4.0 3.7  CL  --   --  95*  --  97* 98*  CO2  --   --  38*  --  34* 36*  GLUCOSE  --   --  42*  --  82 123*  BUN  --   --  25*  --  16 14  CREATININE  --    < > 0.49 0.52 0.43* 0.51  CALCIUM  --   --  9.1  --  8.5* 8.7*  MG 2.0  --   --   --   --   --   PHOS 3.7  --   --   --   --   --    < > = values in this interval not displayed.   GFR: Estimated Creatinine Clearance: 86.5 mL/min (by C-G formula based on SCr of 0.51 mg/dL).  Liver Function Tests: No results for input(s): AST, ALT, ALKPHOS, BILITOT, PROT, ALBUMIN in the last 168 hours. No results for input(s): LIPASE, AMYLASE in the last  168 hours. No results for input(s): AMMONIA in the last 168 hours.  Coagulation Profile: No results for input(s): INR, PROTIME in the last 168 hours.  CBG: Recent Labs  Lab 08/31/17 1559 08/31/17 2047 09/01/17 0038 09/01/17 0457 09/01/17 0753  GLUCAP 203* 152* 136* 113* 100*    Recent Results (from the past 240 hour(s))  Culture, blood (Routine x 2)     Status: Abnormal   Collection Time: 08/22/17  4:06 PM  Result Value Ref Range Status   Specimen Description BLOOD LEFT FOREARM  Final   Special Requests   Final    BOTTLES DRAWN AEROBIC AND ANAEROBIC Blood Culture adequate volume   Culture  Setup Time   Final    GRAM POSITIVE COCCI IN BOTH AEROBIC AND ANAEROBIC BOTTLES CRITICAL RESULT CALLED TO, READ BACK BY AND VERIFIED WITH: T COLBARD,PHARMD AT 1556 08/23/17 BY L BENFIELD Performed at [facility], [place] [address] [place], [place] [address]    Culture STAPHYLOCOCCUS SPECIES (COAGULASE NEGATIVE) (A)  Final   Report Status 08/26/2017 FINAL  Final   Organism ID, Bacteria STAPHYLOCOCCUS SPECIES (COAGULASE NEGATIVE)  Final      Susceptibility   Staphylococcus species (coagulase negative) - MIC*    CIPROFLOXACIN 4 RESISTANT Resistant     ERYTHROMYCIN >=8 RESISTANT Resistant     GENTAMICIN <=0.5 SENSITIVE Sensitive     OXACILLIN >=4 RESISTANT Resistant     TETRACYCLINE <=1 SENSITIVE Sensitive     VANCOMYCIN <=0.5 SENSITIVE Sensitive     TRIMETH/SULFA <=10 SENSITIVE Sensitive     CLINDAMYCIN <=0.25 SENSITIVE Sensitive     RIFAMPIN <=0.5 SENSITIVE Sensitive     Inducible Clindamycin NEGATIVE Sensitive     * STAPHYLOCOCCUS SPECIES (COAGULASE NEGATIVE)  Blood Culture ID Panel (Reflexed)     Status: Abnormal   Collection Time: 08/22/17  4:06 PM  Result Value Ref Range Status   Enterococcus species NOT DETECTED NOT DETECTED Final   Listeria monocytogenes NOT DETECTED NOT DETECTED Final   Staphylococcus species DETECTED (A) NOT DETECTED Final    Comment: Methicillin  (oxacillin) resistant coagulase negative staphylococcus. Possible blood culture contaminant (unless isolated from more than one blood culture draw or clinical case suggests pathogenicity). No antibiotic treatment is indicated for blood  culture contaminants. CRITICAL RESULT CALLED TO, READ BACK BY AND VERIFIED WITH: T COLBARD,PHARMD AT 1556 08/23/17 BY L BENFIELD    Staphylococcus aureus NOT DETECTED NOT DETECTED Final   Methicillin resistance DETECTED (A) NOT DETECTED Final    Comment: CRITICAL RESULT CALLED TO, READ BACK BY AND VERIFIED WITH: T COLBARD,PHARMD AT 1556 08/23/17 BY L BENFIELD  Streptococcus species NOT DETECTED NOT DETECTED Final   Streptococcus agalactiae NOT DETECTED NOT DETECTED Final   Streptococcus pneumoniae NOT DETECTED NOT DETECTED Final   Streptococcus pyogenes NOT DETECTED NOT DETECTED Final   Acinetobacter baumannii NOT DETECTED NOT DETECTED Final   Enterobacteriaceae species NOT DETECTED NOT DETECTED Final   Enterobacter cloacae complex NOT DETECTED NOT DETECTED Final   Escherichia coli NOT DETECTED NOT DETECTED Final   Klebsiella oxytoca NOT DETECTED NOT DETECTED Final   Klebsiella pneumoniae NOT DETECTED NOT DETECTED Final   Proteus species NOT DETECTED NOT DETECTED Final   Serratia marcescens NOT DETECTED NOT DETECTED Final   Haemophilus influenzae NOT DETECTED NOT DETECTED Final   Neisseria meningitidis NOT DETECTED NOT DETECTED Final   Pseudomonas aeruginosa NOT DETECTED NOT DETECTED Final   Candida albicans NOT DETECTED NOT DETECTED Final   Candida glabrata NOT DETECTED NOT DETECTED Final   Candida krusei NOT DETECTED NOT DETECTED Final   Candida parapsilosis NOT DETECTED NOT DETECTED Final   Candida tropicalis NOT DETECTED NOT DETECTED Final    Comment: Performed at [facility], [place] [address] [place], [place] [address]  MRSA PCR Screening     Status: Abnormal   Collection Time: 08/22/17  8:45 PM  Result Value Ref Range Status   MRSA by PCR  POSITIVE (A) NEGATIVE Final    Comment:        The GeneXpert MRSA Assay (FDA approved for NASAL specimens only), is one component of a comprehensive MRSA colonization surveillance program. It is not intended to diagnose MRSA infection nor to guide or monitor treatment for MRSA infections. RESULT CALLED TO, READ BACK BY AND VERIFIED WITHBishop Limbo RN 8016 08/23/17 A BROWNING Performed at [facility], [place] [address], [place], [place] [address]   Culture, blood (Routine x 2)     Status: Abnormal   Collection Time: 08/22/17  9:22 PM  Result Value Ref Range Status   Specimen Description BLOOD RIGHT HAND  Final   Special Requests   Final    BOTTLES DRAWN AEROBIC AND ANAEROBIC Blood Culture adequate volume   Culture  Setup Time   Final    GRAM POSITIVE COCCI IN CLUSTERS IN BOTH AEROBIC AND ANAEROBIC BOTTLES CRITICAL RESULT CALLED TO, READ BACK BY AND VERIFIED WITH: B MANCHERIL,PHARMD AT 1856 08/23/17 BY L BENFIELD    Culture (A)  Final    STAPHYLOCOCCUS SPECIES (COAGULASE NEGATIVE) SUSCEPTIBILITIES PERFORMED ON PREVIOUS CULTURE WITHIN THE LAST 5 DAYS. Performed at [facility], [place] [address], [place], [place] [address]    Report Status 08/26/2017 FINAL  Final  Culture, respiratory (tracheal aspirate)     Status: None   Collection Time: 08/22/17 11:16 PM  Result Value Ref Range Status   Specimen Description TRACHEAL ASPIRATE  Final   Special Requests NONE  Final   Gram Stain   Final    ABUNDANT WBC PRESENT, PREDOMINANTLY PMN RARE SQUAMOUS EPITHELIAL CELLS PRESENT MODERATE GRAM POSITIVE RODS FEW GRAM NEGATIVE COCCI IN PAIRS RARE GRAM NEGATIVE RODS    Culture   Final    Consistent with normal respiratory flora. Performed at [facility], [place] [address], [place], [place] [address]    Report Status 08/25/2017 FINAL  Final  Culture, blood (Routine X 2) w Reflex to ID Panel     Status: None   Collection Time: 08/27/17  9:06 AM  Result Value Ref Range Status    Specimen Description BLOOD RIGHT HAND  Final  Special Requests   Final    BOTTLES DRAWN AEROBIC ONLY Blood Culture results may not be optimal due to an inadequate volume of blood received in culture bottles   Culture   Final    NO GROWTH 5 DAYS Performed at [facility], [place] [address], [place], [place] [address]    Report Status 09/01/2017 FINAL  Final  C difficile quick scan w PCR reflex     Status: Abnormal   Collection Time: 08/27/17 12:36 PM  Result Value Ref Range Status   C Diff antigen POSITIVE (A) NEGATIVE Final   C Diff toxin NEGATIVE NEGATIVE Final   C Diff interpretation Results are indeterminate. See PCR results.  Final  C. Diff by PCR, Reflexed     Status: None   Collection Time: 08/27/17 12:36 PM  Result Value Ref Range Status   Toxigenic C. Difficile by PCR NEGATIVE NEGATIVE Final    Comment: Patient is colonized with non toxigenic C. difficile. May not need treatment unless significant symptoms are present. Performed at [facility], [place] [address], [place], [place] [address]      Scheduled Meds: . acidophilus  1 capsule Oral Daily  . arformoterol  15 mcg Nebulization BID  . budesonide (PULMICORT) nebulizer solution  0.5 mg Nebulization BID  . busPIRone  15 mg Per Tube BID  . chlorhexidine gluconate (MEDLINE KIT)  15 mL Mouth Rinse BID  . Chlorhexidine Gluconate Cloth  6 each Topical Daily  . clonazepam  1 mg Per Tube TID  . diltiazem  30 mg Per Tube Q8H  . doxepin  10 mg Per Tube QHS  . famotidine  20 mg Per Tube BID  . feeding supplement (OSMOLITE 1.5 CAL)  1,000 mL Per Tube Q24H  . feeding supplement (PRO-STAT SUGAR FREE 64)  30 mL Per Tube BID  . free water  100 mL Per Tube Q4H  . insulin aspart  2-6 Units Subcutaneous Q4H  . insulin glargine  18 Units Subcutaneous QHS  . ipratropium-albuterol  3 mL Nebulization Q4H  . mouth rinse  15 mL Mouth Rinse QID  . metoprolol tartrate  12.5 mg Per Tube BID  . montelukast  10 mg Per Tube QHS  .  multivitamin  15 mL Per Tube Daily  . PARoxetine  30 mg Per Tube Daily  . polyethylene glycol  17 g Per Tube Daily  . QUEtiapine  50 mg Per Tube BID  . sodium chloride flush  10-40 mL Intracatheter Q12H     LOS: 10 days   Cherene Altes, MD [facility] Office  [phone] Pager - Text Page per Amion as per below:  On-Call/Text Page:      [email or web address]      password [email or web address]  If 7PM-7AM, please contact night-coverage [email or web address] Password [email or web address] 09/01/2017, 11:32 AM

## 2017-09-01 NOTE — Progress Notes (Signed)
Updated notes sent to [facility] in [place] for review- they can accept patient medically and spoke with dtr today about [employer] Medicaid application- they can accept patient this week if stable  CSW continuing to follow  Burna Sis, LCSW Clinical Social Worker [phone]

## 2017-09-02 DIAGNOSIS — B9689 Other specified bacterial agents as the cause of diseases classified elsewhere: Secondary | ICD-10-CM

## 2017-09-02 DIAGNOSIS — I959 Hypotension, unspecified: Secondary | ICD-10-CM

## 2017-09-02 LAB — COMPREHENSIVE METABOLIC PANEL
ALT: 22 U/L (ref 14–54)
AST: 19 U/L (ref 15–41)
Albumin: 2.6 g/dL — ABNORMAL LOW (ref 3.5–5.0)
Alkaline Phosphatase: 60 U/L (ref 38–126)
Anion gap: 8 (ref 5–15)
BILIRUBIN TOTAL: 0.8 mg/dL (ref 0.3–1.2)
BUN: 16 mg/dL (ref 6–20)
CO2: 37 mmol/L — ABNORMAL HIGH (ref 22–32)
Calcium: 8.6 mg/dL — ABNORMAL LOW (ref 8.9–10.3)
Chloride: 98 mmol/L — ABNORMAL LOW (ref 101–111)
Creatinine, Ser: 0.51 mg/dL (ref 0.44–1.00)
GFR calc Af Amer: >60 mL/min (ref >60)
Glucose, Bld: 82 mg/dL (ref 65–99)
POTASSIUM: 3.6 mmol/L (ref 3.5–5.1)
Sodium: 143 mmol/L (ref 135–145)
TOTAL PROTEIN: 5 g/dL — AB (ref 6.5–8.1)

## 2017-09-02 LAB — CBC
HEMATOCRIT: 28.2 % — AB (ref 36.0–46.0)
Hemoglobin: 7.4 g/dL — ABNORMAL LOW (ref 12.0–15.0)
MCH: 22.9 pg — ABNORMAL LOW (ref 26.0–34.0)
MCHC: 26.2 g/dL — ABNORMAL LOW (ref 30.0–36.0)
MCV: 87.3 fL (ref 78.0–100.0)
PLATELETS: 194 10*3/uL (ref 150–400)
RBC: 3.23 MIL/uL — AB (ref 3.87–5.11)
RDW: 21.4 % — ABNORMAL HIGH (ref 11.5–15.5)
WBC: 11.7 10*3/uL — AB (ref 4.0–10.5)

## 2017-09-02 LAB — GLUCOSE, CAPILLARY
GLUCOSE-CAPILLARY: 91 mg/dL (ref 65–99)
GLUCOSE-CAPILLARY: 112 mg/dL — AB (ref 65–99)
GLUCOSE-CAPILLARY: 118 mg/dL — AB (ref 65–99)
Glucose-Capillary: 113 mg/dL — ABNORMAL HIGH (ref 65–99)

## 2017-09-02 LAB — MAGNESIUM: MAGNESIUM: 2.2 mg/dL (ref 1.7–2.4)

## 2017-09-02 MED ORDER — FREE WATER
200.0000 mL | Status: DC
  Administered 2017-09-02 – 2017-09-13 (×62): 200 mL

## 2017-09-02 NOTE — Progress Notes (Addendum)
[place] TEAM 1 - Stepdown/ICU TEAM  Danielle Stevens  PQZ:[id number] DOB: [birth year] DOA: 08/22/2017 PCP: Patient, No Pcp Per    Brief Narrative:  34 year oldfemale LTACH resident w/ a hx of alpha-1 antitrypsin deficiency and chronic ventilator dependent respiratory failure who presented unresponsive due to acute hypercapnic respiratory failure felt to be due to mucous plugging.  By the time she arrived in the ED EMS has actually essentially corrected her acute resp decline.      Significant Events: 4/26 admit 4/30 Port-a-cath removed in OR 5/7 PICC placed   Subjective: Pt is resting comfortably on vent w/o resp distress.  No uncontrolled pain.    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 Port removed 4/30 - continue IV antibiotic therapy through 09/10/17 as per ID suggestions   Acute on chronic hypercapnic respiratory failure - COPD + A1 AT deficiency / advanced lung disease - chronic tracheostomy  Cont full vent support w/ no attempts to wean - [facility] term [facility]an attempts - cont bronchodilator therapy - on once weekly Prolastin for alpha 1 antitrypsin deficiency (will require replacement of port-a-cath after abx tx completed)  DM2 CBG well controlled at this time  Hypertension Blood pressure borderline low at this time - stop CCB and follow   Chronic multifactorial normocytic anemia No evidence of acute blood loss - continue to follow trend  Depression Continue clonazepam, quetiapine, buspirone, as needed lorazepam   DVT prophylaxis: lovenox  Code Status: FULL CODE Family Communication: no family present at time of exam  Disposition Plan: [facility] to [facility] (pt does not wish to return to [facility])  Consultants:  PCCM ID Gen Surgery   Antimicrobials:  Levofloxacin 4/26 > 4/30 Vancomycin 4/27 >  Objective: Blood pressure (!) 66/41, pulse 87, temperature 98.6 F (37  C), temperature source Oral, resp. rate (!) 21, height '5\' 4"'$  (1.626 m), weight 76.9 kg (169 lb 8.5 oz), SpO2 98 %.  Intake/Output Summary (Last 24 hours) at 09/02/2017 1413 Last data filed at 09/02/2017 1200 Gross per 24 hour  Intake 1330 ml  Output 150 ml  Net 1180 ml   Filed Weights   08/31/17 0402 09/01/17 0248 09/02/17 0513  Weight: 77.5 kg (170 lb 13.7 oz) 77.2 kg (170 lb 3.1 oz) 76.9 kg (169 lb 8.5 oz)    Examination: General: No acute respiratory distress - resting comfortably  Lungs: Clear to auscultation bilaterally w/o wheezing  Cardiovascular: RRR - no M or rub  Abdomen: Nondistended, soft, bowel sounds positive, no rebound Extremities: 1+ diffuse edema w/o change   CBC: Recent Labs  Lab 08/31/17 0241 09/01/17 0343 09/02/17 0601  WBC 14.6* 13.0* 11.7*  NEUTROABS 11.7* 10.3*  --   HGB 7.8* 7.3* 7.4*  HCT 28.6* 28.5* 28.2*  MCV 86.4 86.6 87.3  PLT 156 228 335   Basic Metabolic Panel: Recent Labs  Lab 08/31/17 0241 09/01/17 0343 09/02/17 0601  NA 139 142 143  K 4.0 3.7 3.6  CL 97* 98* 98*  CO2 34* 36* 37*  GLUCOSE 82 123* 82  BUN '16 14 16  '$ CREATININE 0.43* 0.51 0.51  CALCIUM 8.5* 8.7* 8.6*  MG  --   --  2.2   GFR: Estimated Creatinine Clearance: 86.3 mL/min (by C-G formula based on SCr of 0.51 mg/dL).  Liver Function Tests: Recent Labs  Lab 09/02/17 0601  AST 19  ALT 22  ALKPHOS 60  BILITOT  0.8  PROT 5.0*  ALBUMIN 2.6*    CBG: Recent Labs  Lab 09/01/17 1609 09/01/17 2000 09/01/17 2242 09/02/17 0809 09/02/17 1136  GLUCAP 105* 123* 130* 113* 91    Recent Results (from the past 240 hour(s))  Culture, blood (Routine X 2) w Reflex to ID Panel     Status: None   Collection Time: 08/27/17  9:06 AM  Result Value Ref Range Status   Specimen Description BLOOD RIGHT HAND  Final   Special Requests   Final    BOTTLES DRAWN AEROBIC ONLY Blood Culture results may not be optimal due to an inadequate volume of blood received in culture bottles    Culture   Final    NO GROWTH 5 DAYS Performed at [facility], [place] [address] [place], [place] [address]    Report Status 09/01/2017 FINAL  Final  C difficile quick scan w PCR reflex     Status: Abnormal   Collection Time: 08/27/17 12:36 PM  Result Value Ref Range Status   C Diff antigen POSITIVE (A) NEGATIVE Final   C Diff toxin NEGATIVE NEGATIVE Final   C Diff interpretation Results are indeterminate. See PCR results.  Final  C. Diff by PCR, Reflexed     Status: None   Collection Time: 08/27/17 12:36 PM  Result Value Ref Range Status   Toxigenic C. Difficile by PCR NEGATIVE NEGATIVE Final    Comment: Patient is colonized with non toxigenic C. difficile. May not need treatment unless significant symptoms are present. Performed at [facility], [place] [address] [place], [place] [address]      Scheduled Meds: . acidophilus  1 capsule Oral Daily  . arformoterol  15 mcg Nebulization BID  . budesonide (PULMICORT) nebulizer solution  0.5 mg Nebulization BID  . busPIRone  15 mg Per Tube BID  . chlorhexidine gluconate (MEDLINE KIT)  15 mL Mouth Rinse BID  . Chlorhexidine Gluconate Cloth  6 each Topical Daily  . Chlorhexidine Gluconate Cloth  6 each Topical Daily  . clonazepam  1 mg Per Tube TID  . diltiazem  30 mg Per Tube Q8H  . doxepin  10 mg Per Tube QHS  . enoxaparin (LOVENOX) injection  40 mg Subcutaneous Q24H  . famotidine  20 mg Per Tube BID  . feeding supplement (OSMOLITE 1.5 CAL)  1,000 mL Per Tube Q24H  . feeding supplement (PRO-STAT SUGAR FREE 64)  30 mL Per Tube BID  . free water  100 mL Per Tube Q4H  . insulin aspart  0-5 Units Subcutaneous QHS  . insulin aspart  0-9 Units Subcutaneous TID WC  . insulin glargine  18 Units Subcutaneous QHS  . ipratropium-albuterol  3 mL Nebulization Q6H  . mouth rinse  15 mL Mouth Rinse QID  . metoprolol tartrate  12.5 mg Per Tube BID  . montelukast  10 mg Per Tube QHS  . multivitamin  15 mL Per Tube Daily  . PARoxetine   30 mg Per Tube Daily  . polyethylene glycol  17 g Per Tube Daily  . QUEtiapine  50 mg Per Tube BID  . sodium chloride flush  10-40 mL Intracatheter Q12H     LOS: 11 days   Cherene Altes, MD [facility] Office  [phone] Pager - Text Page per Amion as per below:  On-Call/Text Page:      [email or web address]      password [email or web address]  If 7PM-7AM, please contact night-coverage [email or web address] Password [email or web address] 09/02/2017, 2:13  PM

## 2017-09-02 NOTE — Progress Notes (Signed)
Updates sent to [facility] and [facility]   Awaiting determination from both facilities about who is able to accept patient  Burna Sis, LCSW Clinical Social Worker [phone]

## 2017-09-03 DIAGNOSIS — E8801 Alpha-1-antitrypsin deficiency: Secondary | ICD-10-CM

## 2017-09-03 DIAGNOSIS — Z930 Tracheostomy status: Secondary | ICD-10-CM

## 2017-09-03 DIAGNOSIS — R0902 Hypoxemia: Secondary | ICD-10-CM

## 2017-09-03 LAB — CBC
HCT: 25.7 % — ABNORMAL LOW (ref 36.0–46.0)
Hemoglobin: 7.1 g/dL — ABNORMAL LOW (ref 12.0–15.0)
MCH: 23.7 pg — AB (ref 26.0–34.0)
MCHC: 27.6 g/dL — ABNORMAL LOW (ref 30.0–36.0)
MCV: 85.7 fL (ref 78.0–100.0)
PLATELETS: 171 10*3/uL (ref 150–400)
RBC: 3 MIL/uL — AB (ref 3.87–5.11)
RDW: 21.1 % — ABNORMAL HIGH (ref 11.5–15.5)
WBC: 10.1 10*3/uL (ref 4.0–10.5)

## 2017-09-03 LAB — GLUCOSE, CAPILLARY
GLUCOSE-CAPILLARY: 118 mg/dL — AB (ref 65–99)
GLUCOSE-CAPILLARY: 178 mg/dL — AB (ref 65–99)
GLUCOSE-CAPILLARY: 116 mg/dL — AB (ref 65–99)
GLUCOSE-CAPILLARY: 116 mg/dL — AB (ref 65–99)

## 2017-09-03 LAB — FERRITIN: Ferritin: 19 ng/mL (ref 11–307)

## 2017-09-03 LAB — IRON AND TIBC
IRON: 28 ug/dL (ref 28–170)
SATURATION RATIOS: 9 % — AB (ref 10.4–31.8)
TIBC: 321 ug/dL (ref 250–450)
UIBC: 293 ug/dL

## 2017-09-03 MED ORDER — VITAL AF 1.2 CAL PO LIQD
1000.0000 mL | ORAL | Status: DC
  Administered 2017-09-03 – 2017-09-25 (×18): 1000 mL
  Filled 2017-09-03 (×34): qty 1000

## 2017-09-03 MED ORDER — ALPHA1-PROTEINASE INHIBITOR 1000 MG IV SOLR: 60.0000 mg/kg | INTRAVENOUS | Status: DC

## 2017-09-03 MED ORDER — EMPTY CONTAINERS FLEXIBLE MISC
4964.0000 mg | Status: DC
  Filled 2017-09-03: qty 4964

## 2017-09-03 MED ORDER — EMPTY CONTAINERS FLEXIBLE MISC: 4638.0000 mg | Status: DC

## 2017-09-03 NOTE — Progress Notes (Signed)
[facility] PROGRESS NOTE  Danielle Stevens DTO:[id number] DOB: [birth year] DOA: 08/22/2017 PCP: Patient, No Pcp Per  Brief summary   43 year oldfemale LTACH resident w/ a hx of alpha-1 antitrypsin deficiency and chronic ventilator dependent respiratory failure who presented unresponsive due to acute hypercapnic respiratory failure felt to be due to mucous plugging.  By the time she arrived in the ED EMS has actually essentially corrected her acute resp decline.      Significant Events: 4/26 admit 4/30 Port-a-cath removed in OR 5/7 PICC placed   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. Port removed 4/30 - continue IV antibiotic therapy through 09/10/17 as per ID suggestions   Acute on chronic hypercapnic respiratory failure - COPD + A1 AT deficiency / advanced lung disease - chronic tracheostomy  Cont full vent support w/ no attempts to wean - plan to return [facility] for possible long term wean attempts - cont bronchodilator therapy - on once weekly Prolastin for alpha 1 antitrypsin deficiency (will require replacement of port-a-cath after abx tx completed)  DM2. CBG well controlled at this time  Hypertension Blood pressure borderline low at this time - stop CCB and follow   Chronic multifactorial normocytic anemia No evidence of acute blood loss - continue to follow trend  Depression Continueclonazepam, quetiapine, buspirone, as needed lorazepam  Anemia. Hg is stable >7.0. Will recheck am. No s/s of acute bleeding. Check iron panel    Code Status: full Family Communication:  D/w patient, RN (indicate person spoken with, relationship, and if by phone, the number) Disposition Plan: pend ltec    Consultants:  pccm  Id  Surgery   Antibiotics: Anti-infectives (From admission, onward)   Start     Dose/Rate Route Frequency Ordered Stop   08/26/17 2000  levofloxacin (LEVAQUIN) tablet 500 mg     500 mg Per Tube  Daily 08/26/17 1053 08/26/17 2040   08/26/17 0845  vancomycin (VANCOCIN) IVPB 750 mg/150 ml premix     750 mg 150 mL/hr over 60 Minutes Intravenous Every 12 hours 08/26/17 0844 09/10/17 2359   08/24/17 0400  vancomycin (VANCOCIN) IVPB 750 mg/150 ml premix  Status:  Discontinued     750 mg 150 mL/hr over 60 Minutes Intravenous Every 8 hours 08/23/17 1925 08/25/17 1324   08/23/17 2000  vancomycin (VANCOCIN) 1,250 mg in sodium chloride 0.9 % 250 mL IVPB     1,250 mg 166.7 mL/hr over 90 Minutes Intravenous  Once 08/23/17 1925 08/23/17 2328   08/22/17 2330  levofloxacin (LEVAQUIN) IVPB 750 mg  Status:  Discontinued     750 mg 100 mL/hr over 90 Minutes Intravenous Daily at bedtime 08/22/17 2240 08/26/17 1053      (indicate start date, and stop date if known)  HPI/Subjective: Reports feeling "ok". Afebrile. Still having mild high output in rectal tube   Objective: Vitals:   09/03/17 0844 09/03/17 0848  BP:    Pulse:    Resp:    Temp:    SpO2: 97% 97%    Intake/Output Summary (Last 24 hours) at 09/03/2017 0907 Last data filed at 09/03/2017 0800 Gross per 24 hour  Intake 2060 ml  Output 900 ml  Net 1160 ml   Filed Weights   09/01/17 0248 09/02/17 0513 09/03/17 0230  Weight: 77.2 kg (170 lb 3.1 oz) 76.9 kg (169 lb 8.5 oz) 77.3 kg (170 lb 6.7 oz)    Exam:   General: chronically ill   Cardiovascular: s1,s2 rrr  Respiratory: diminished in LL  Abdomen: soft, nt   Musculoskeletal: no leg edema   Data Reviewed: Basic Metabolic Panel: Recent Labs  Lab 08/29/17 0547 08/31/17 0241 09/01/17 0343 09/02/17 0601  NA  --  139 142 143  K  --  4.0 3.7 3.6  CL  --  97* 98* 98*  CO2  --  34* 36* 37*  GLUCOSE  --  82 123* 82  BUN  --  '16 14 16  '$ CREATININE 0.52 0.43* 0.51 0.51  CALCIUM  --  8.5* 8.7* 8.6*  MG  --   --   --  2.2   Liver Function Tests: Recent Labs  Lab 09/02/17 0601  AST 19  ALT 22  ALKPHOS 60  BILITOT 0.8  PROT 5.0*  ALBUMIN 2.6*   No results for  input(s): LIPASE, AMYLASE in the last 168 hours. No results for input(s): AMMONIA in the last 168 hours. CBC: Recent Labs  Lab 08/31/17 0241 09/01/17 0343 09/02/17 0601 09/03/17 0429  WBC 14.6* 13.0* 11.7* 10.1  NEUTROABS 11.7* 10.3*  --   --   HGB 7.8* 7.3* 7.4* 7.1*  HCT 28.6* 28.5* 28.2* 25.7*  MCV 86.4 86.6 87.3 85.7  PLT 156 228 194 171   Cardiac Enzymes: No results for input(s): CKTOTAL, CKMB, CKMBINDEX, TROPONINI in the last 168 hours. BNP (last 3 results) No results for input(s): BNP in the last 8760 hours.  ProBNP (last 3 results) No results for input(s): PROBNP in the last 8760 hours.  CBG: Recent Labs  Lab 09/02/17 0809 09/02/17 1136 09/02/17 1757 09/02/17 2209 09/03/17 0713  GLUCAP 113* 91 112* 118* 118*    Recent Results (from the past 240 hour(s))  Culture, blood (Routine X 2) w Reflex to ID Panel     Status: None   Collection Time: 08/27/17  9:06 AM  Result Value Ref Range Status   Specimen Description BLOOD RIGHT HAND  Final   Special Requests   Final    BOTTLES DRAWN AEROBIC ONLY Blood Culture results may not be optimal due to an inadequate volume of blood received in culture bottles   Culture   Final    NO GROWTH 5 DAYS Performed at [facility], [place] [address] [place], [place] [address]    Report Status 09/01/2017 FINAL  Final  C difficile quick scan w PCR reflex     Status: Abnormal   Collection Time: 08/27/17 12:36 PM  Result Value Ref Range Status   C Diff antigen POSITIVE (A) NEGATIVE Final   C Diff toxin NEGATIVE NEGATIVE Final   C Diff interpretation Results are indeterminate. See PCR results.  Final  C. Diff by PCR, Reflexed     Status: None   Collection Time: 08/27/17 12:36 PM  Result Value Ref Range Status   Toxigenic C. Difficile by PCR NEGATIVE NEGATIVE Final    Comment: Patient is colonized with non toxigenic C. difficile. May not need treatment unless significant symptoms are present. Performed at [facility], [place] [address] [place], [place] [address]      Studies: [place] Ekg Site Rite  Result Date: 09/01/2017 If [facility] image not attached, placement could not be confirmed due to current cardiac rhythm.   Scheduled Meds: . acidophilus  1 capsule Oral Daily  . arformoterol  15 mcg Nebulization BID  . budesonide (PULMICORT) nebulizer solution  0.5 mg Nebulization BID  . busPIRone  15 mg Per Tube BID  . chlorhexidine gluconate (MEDLINE  KIT)  15 mL Mouth Rinse BID  . Chlorhexidine Gluconate Cloth  6 each Topical Daily  . Chlorhexidine Gluconate Cloth  6 each Topical Daily  . clonazepam  1 mg Per Tube TID  . doxepin  10 mg Per Tube QHS  . enoxaparin (LOVENOX) injection  40 mg Subcutaneous Q24H  . famotidine  20 mg Per Tube BID  . feeding supplement (OSMOLITE 1.5 CAL)  1,000 mL Per Tube Q24H  . feeding supplement (PRO-STAT SUGAR FREE 64)  30 mL Per Tube BID  . free water  200 mL Per Tube Q4H  . insulin aspart  0-5 Units Subcutaneous QHS  . insulin aspart  0-9 Units Subcutaneous TID WC  . insulin glargine  18 Units Subcutaneous QHS  . ipratropium-albuterol  3 mL Nebulization Q6H  . mouth rinse  15 mL Mouth Rinse QID  . metoprolol tartrate  12.5 mg Per Tube BID  . montelukast  10 mg Per Tube QHS  . multivitamin  15 mL Per Tube Daily  . PARoxetine  30 mg Per Tube Daily  . polyethylene glycol  17 g Per Tube Daily  . QUEtiapine  50 mg Per Tube BID  . sodium chloride flush  10-40 mL Intracatheter Q12H   Continuous Infusions: . vancomycin Stopped (09/02/17 2247)    Active Problems:   Hypercarbia   Acute on chronic respiratory failure with hypoxia and hypercapnia (HCC)   COPD exacerbation (HCC)   VAP (ventilator-associated pneumonia) (HCC)   Hypotension    Time spent: >25 minutes     Kinnie Feil  [facility] Pager [phone]. If 7PM-7AM, please contact night-coverage at [email or web address], password [facility] 09/03/2017, 9:07 AM  LOS: 12 days

## 2017-09-03 NOTE — Progress Notes (Signed)
Pharmacy Antibiotic Note  Danielle Stevens is a 47 y.o. female admitted on 08/22/2017 with MRSE bacteremia to s/p removal of port-a-cath. Remains on vancomycin, planning to continue until 5/15. SCr 0.5, uop 0.4.   Plan: Continue Vancomycin 750 mg IV q12h, plan to continue through 5/15 Check VT over the weekend Monitor renal fx, uop   Height: 5\' 4"  (162.6 cm) Weight: 170 lb 6.7 oz (77.3 kg) IBW/kg (Calculated) : 54.7  Temp (24hrs), Avg:98.7 F (37.1 C), Min:98.4 F (36.9 C), Max:98.9 F (37.2 C)  Recent Labs  Lab 08/28/17 0802 08/29/17 0547 08/31/17 0241 08/31/17 2116 09/01/17 0343 09/02/17 0601 09/03/17 0429  WBC  --   --  14.6*  --  13.0* 11.7* 10.1  CREATININE  --  0.52 0.43*  --  0.51 0.51  --   VANCOTROUGH 18  --   --  17  --   --   --     Estimated Creatinine Clearance: 86.5 mL/min (by C-G formula based on SCr of 0.51 mg/dL).    Allergies  Allergen Reactions  . Wasp Venom Protein Shortness Of Breath  . Adhesive [Tape] Other (See Comments)    Bruises   . Coconut Oil Hives  . Latex Other (See Comments)    Bruises   . Robitussin Severe Multi-Symp [Phenylephrine-Dm-Gg-Apap] Diarrhea and Other (See Comments)    Allergic, per verbal MAR  . Shellfish-Derived Products Hives and Other (See Comments)    Allergic, per verbal MAR    Levaquin 4/26 >> 4/30 Vancomycin 4/27 >>  4/26 TA: normal resp flora 4/26 BCx: 2/2 CONS 4/26 BCID: MRSE 4/26 MRSA PCR: pos 5/1 cdif antigen +, tox neg 5/1 blood x 1 > ngF   [name] 09/03/2017 8:45 AM

## 2017-09-03 NOTE — Progress Notes (Addendum)
I have seen and examined the patient with nurse practitioner/resident and agree with the note above.  We formulated the plan together and I elicited the following history.    Subjective: Awake No acute issues.  Appears comfortable on ATC  Objective: Vitals:   09/03/17 0800 09/03/17 0844 09/03/17 0848 09/03/17 1000  BP: 100/71   110/67  Pulse: (Abnormal) 117   (Abnormal) 111  Resp: 16     Temp:      TempSrc:      SpO2: 98% 97% 97% 97%  Weight:      Height:       Vent Mode: PRVC FiO2 (%):  [40 %] 40 % Set Rate:  [12 bmp] 12 bmp Vt Set:  [380 mL] 380 mL PEEP:  [5 cmH20] 5 cmH20 Plateau Pressure:  [16 cmH20-21 cmH20] 20 cmH20  Intake/Output Summary (Last 24 hours) at 09/03/2017 1059 Last data filed at 09/03/2017 1002 Gross per 24 hour  Intake 2060 ml  Output 900 ml  Net 1160 ml   General: 47 year old [place] female resting comfortably in bed. No distress HENT: trach site unremarkable MMM sclera not icteric Pulm: clear, diminished decreased bases Card RRR abd PEG unremarkable + bowel sounds  Gu: clear yellow  Neuro: intact moves all extremities   CBC    Component Value Date/Time   WBC 10.1 09/03/2017 0429   RBC 3.00 (L) 09/03/2017 0429   HGB 7.1 (L) 09/03/2017 0429   HCT 25.7 (L) 09/03/2017 0429   PLT 171 09/03/2017 0429   MCV 85.7 09/03/2017 0429   MCH 23.7 (L) 09/03/2017 0429   MCHC 27.6 (L) 09/03/2017 0429   RDW 21.1 (H) 09/03/2017 0429   LYMPHSABS 1.4 09/01/2017 0343   MONOABS 0.9 09/01/2017 0343   EOSABS 0.4 09/01/2017 0343   BASOSABS 0.0 09/01/2017 0343    BMET    Component Value Date/Time   NA 143 09/02/2017 0601   K 3.6 09/02/2017 0601   CL 98 (L) 09/02/2017 0601   CO2 37 (H) 09/02/2017 0601   GLUCOSE 82 09/02/2017 0601   BUN 16 09/02/2017 0601   CREATININE 0.51 09/02/2017 0601   CALCIUM 8.6 (L) 09/02/2017 0601   GFRNONAA >60 09/02/2017 0601   GFRAA >60 09/02/2017 0601    Impression/Plan:  Severe chronic respiratory failure with hypoxemia  from COPD from A-1-AT deficiency. Had been on prolastin replacement weekly thru [facility].  Ventilator dependence Trach dependence  Coag neg SA bacteremia d/t indwelling IV access site. Now removed.   Discussion 47 year old female w/ chronic hypoxic respiratory failure d/t COPD w/ alpha 1 antitrypsin deficiency. Now ventilator and trach dependent. Acutely hospitalized for bacteremia d/t catheter infection since removed. I am not certain she can be weaned due to her deconditioning and advanced pulmonary disease, but if she is to have any hope certainly needs to continue/resume her prolastin.   Plan/rec Continue scheduled BDs & inhaled corticosteroids Cont full ventilator support & daily attempt at PSV for now. WIll see how she does w/ this/ maybe ATC trial? Will see.  Cont rehab efforts. She needs to get OOB DAILY; PT following.  abx per ID and primary team.  Will see her again in am   Simonne Martinet ACNP-BC [facility] Pulmonary/Critical Care Pager # [phone] OR # [phone] if no answer  Attending Note:  47 year old female with alpha-1-anti-trypsin who is advance enough for respiratory failure and chronic trach.  PCCM was consulted for vent management and alpha-1-anti-trypsin deficiency.  On  exam, mild end exp wheezes noted with trach site clean.  I reviewed CXR myself, trach is in good position and hyperinflation noted.  Discussed with PCCM-NP.  Chronic respiratory failure:  - Full vent support for now but daily PS trials to assist with conditioning  Hypoxemia:  - Titrate O2 for sat of 88-92%  COPD:  - BD as ordered  - ICS as ordered  Alpha-1-anti-trypsin deficiency:  - Will need to continue medications even though respiratory failure has already set in.  Will prevent further progression of disease.  Trach status:  - Maintain current trach size and type, no downsize and keep cuff  PCCM will continue to follow for vent/trach/a1at.  Alyson Reedy, M.D. [facility] Pulmonary/Critical  Care Medicine. Pager: [phone]. After hours pager: [phone].

## 2017-09-03 NOTE — Progress Notes (Signed)
PT Cancellation Note  Patient Details Name: Danielle Stevens MRN: [id number] DOB: Jan 18, 1971   Cancelled Treatment:    Reason Eval/Treat Not Completed: Pain limiting ability to participate. Pt adamantly refused to work with PT today despite education on importance of mobility. Pt states she doesn't feel good and her bottom is sore. RN and PT both emphasized on  importance of change of position and OOB mobility. Acute PT to return as able.  Lewis Shock, PT, DPT Pager #: [phone] Office #: [phone]    Rozell Searing Dquan Cortopassi 09/03/2017, 2:19 PM

## 2017-09-03 NOTE — Plan of Care (Signed)
  Problem: Coping: Goal: Level of anxiety will decrease Outcome: Progressing Note:  Patient's anxiety improved- patient using call bell less frequently with requests for staff and sleeping during shift. Still requests bed to be turned on CPT mode for vibration at times.    Problem: Clinical Measurements: Goal: Cardiovascular complication will be avoided Outcome: Not Progressing Note:  Patient refuses to wear SCD's despite education and does not have another form of VTE prophylaxis.    Problem: Activity: Goal: Ability to tolerate increased activity will improve Outcome: Completed/Met Note:  Patient at baseline activity level.    Problem: Role Relationship: Goal: Method of communication will improve Outcome: Completed/Met Note:  Patient has no problem communicating with staff. She uses call bell and is able to nod and gesture/ mouth words appropriately for communication needs in presence of trach.

## 2017-09-03 NOTE — Progress Notes (Signed)
Nutrition Follow-up  DOCUMENTATION CODES:   Not applicable  INTERVENTION:   Tube Feeding:  Change TF formula to Vital AF 1.2 @ 60 ml/hr  In hopes to assist with diarrhea management while in acute care.  D/C Pro-Stat Provides 1728 kcals, 108 g of protein and 1166 mL of free water    NUTRITION DIAGNOSIS:   Inadequate oral intake related to inability to eat(Chronic resp failure, PEG dependant) as evidenced by NPO status.  Being addressed via TF   GOAL:   Patient will meet greater than or equal to 90% of their needs  Met  MONITOR:   Vent status, TF tolerance, Weight trends, Labs  REASON FOR ASSESSMENT:   Consult Enteral/tube feeding initiation and management  ASSESSMENT:   47-year-old who suffers from alpha-1 antitrypsin deficiency and has ventilator dependent respiratory failure.  At [facility] earlier today she was suddenly unresponsive and EMS was called.  Pt remains on vent support via trach, TC trials as able Osmolite 1.5 at 40 ml/hr, Pro-Stat 30 mL BID, free water 200 mL q 4 hours via PEG tube Liquid stool via rectal tube. C.diff antigen positive on 5/1, toxin negative. Pro-biotic started  Weight trending up; noted net + 9L since admission  Labs: reviewed Meds: lactobacillus   Diet Order:   Diet Order    None      EDUCATION NEEDS:   No education needs have been identified at this time  Skin:  Skin Assessment: Skin Integrity Issues: Skin Integrity Issues:: Other (Comment) Other: MASD: buttock, groin  Last BM:  5/8 rectal tube  Height:   Ht Readings from Last 1 Encounters:  08/22/17 5' 4" (1.626 m)    Weight:   Wt Readings from Last 1 Encounters:  09/03/17 170 lb 6.7 oz (77.3 kg)    Ideal Body Weight:  54.55 kg  BMI:  Body mass index is 29.25 kg/m.  Estimated Nutritional Needs:   Kcal:  1650-1800 kcals (23-25 kcal/kg bw)  Protein:  87-100 grams (1.2-1.4 grams/kg)  Fluid:  >1.8 L fluid  Cate  MS, RD, LDN, CNSC [phone]  [phone] Pager  [phone] Weekend/On-Call Pager  

## 2017-09-03 NOTE — Progress Notes (Signed)
CSW continuing to work on placement for patient.  [facility] unsure if they can take patient requiring weekly Prolastin due to high cost (about $2,000/dose)- they are reviewing with director.  [facility] also reviewing to see if they can accept patient back- had issues with high level of anxiety at facility- pt was calling respiratory therapist constantly so might be too high level of needs to return.  CSW will continue to follow- awaiting to hear determination from facilities regarding ability to accept patient.  Burna Sis, LCSW Clinical Social Worker [phone]

## 2017-09-03 NOTE — Plan of Care (Signed)
No changes in the patient's condition this shift. Still awaiting placement.

## 2017-09-04 DIAGNOSIS — D649 Anemia, unspecified: Secondary | ICD-10-CM

## 2017-09-04 LAB — CBC
HEMATOCRIT: 23.8 % — AB (ref 36.0–46.0)
HEMOGLOBIN: 6.6 g/dL — AB (ref 12.0–15.0)
MCH: 23.7 pg — ABNORMAL LOW (ref 26.0–34.0)
MCHC: 27.7 g/dL — ABNORMAL LOW (ref 30.0–36.0)
MCV: 85.3 fL (ref 78.0–100.0)
PLATELETS: 153 10*3/uL (ref 150–400)
RBC: 2.79 MIL/uL — ABNORMAL LOW (ref 3.87–5.11)
RDW: 21 % — AB (ref 11.5–15.5)
WBC: 8.5 10*3/uL (ref 4.0–10.5)

## 2017-09-04 LAB — GLUCOSE, CAPILLARY
GLUCOSE-CAPILLARY: 135 mg/dL — AB (ref 65–99)
Glucose-Capillary: 158 mg/dL — ABNORMAL HIGH (ref 65–99)
Glucose-Capillary: 75 mg/dL (ref 65–99)
Glucose-Capillary: 111 mg/dL — ABNORMAL HIGH (ref 65–99)

## 2017-09-04 LAB — ABO/RH: ABO/RH(D): A POS

## 2017-09-04 LAB — PREPARE RBC (CROSSMATCH)

## 2017-09-04 LAB — HEMOGLOBIN AND HEMATOCRIT, BLOOD
HCT: 24.9 % — ABNORMAL LOW (ref 36.0–46.0)
HEMATOCRIT: 29.4 % — AB (ref 36.0–46.0)
HEMOGLOBIN: 6.9 g/dL — AB (ref 12.0–15.0)
HEMOGLOBIN: 8.4 g/dL — AB (ref 12.0–15.0)

## 2017-09-04 LAB — OCCULT BLOOD X 1 CARD TO LAB, STOOL: FECAL OCCULT BLD: NEGATIVE

## 2017-09-04 MED ORDER — ALPHA1-PROTEINASE INHIBITOR 1000 MG IV SOLR
4964.0000 mg | INTRAVENOUS | Status: DC
  Administered 2017-09-04: 4964 mg via INTRAVENOUS
  Filled 2017-09-04 (×2): qty 4964

## 2017-09-04 MED ORDER — EMPTY CONTAINERS FLEXIBLE MISC: 4964.0000 mg | Status: DC

## 2017-09-04 MED ORDER — EMPTY CONTAINERS FLEXIBLE MISC
4964.0000 mg | Status: DC
  Filled 2017-09-04: qty 4964

## 2017-09-04 MED ORDER — SODIUM CHLORIDE 0.9 % IV SOLN
Freq: Once | INTRAVENOUS | Status: AC
  Administered 2017-09-04: 09:00:00 via INTRAVENOUS

## 2017-09-04 NOTE — Progress Notes (Signed)
eLink Physician-Brief Progress Note Patient Name: Danielle Stevens DOB: 1970-06-23 MRN: [id number]   Date of Service  09/04/2017  HPI/Events of Note  Hgb 6.6 (Hgb has slowly trended downwards the last couple of days. No overt evidence of bleeding.  eICU Interventions  Recheck H & H, type and screen, stool occult blood, Continue Pepcid pending recheck of Hgb-switch to PPI and consult GI if result confirmed        Thomasene Lot Yessika Otte 09/04/2017, 6:10 AM

## 2017-09-04 NOTE — Progress Notes (Addendum)
PT Cancellation Note  Patient Details Name: Danielle Stevens MRN: [id number] DOB: 05-13-1970   Cancelled Treatment:    Reason Eval/Treat Not Completed: Other (comment). Pt c/o fatigue. Will follow-up for PT treatment.  [name], PT, DPT Acute Rehab Services  Pager: [phone]  [name] 09/04/2017, 5:24 PM

## 2017-09-04 NOTE — Progress Notes (Signed)
[facility] PROGRESS NOTE  Danielle Stevens BOF:[id number] DOB: July 01, 1970 DOA: 08/22/2017 PCP: Patient, No Pcp Per  Brief summary   15 year oldfemale LTACH resident w/ a hx of alpha-1 antitrypsin deficiency and chronic ventilator dependent respiratory failure who presented unresponsive due to acute hypercapnic respiratory failure felt to be due to mucous plugging.  By the time she arrived in the ED EMS has actually essentially corrected her acute resp decline.      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. Port removed 4/30 - continue IV antibiotic therapy through 09/10/17 as per ID suggestions   Acute on chronic hypercapnic respiratory failure - COPD + A1 AT deficiency / advanced lung disease - chronic tracheostomy  Cont full vent support w/ no attempts to wean - plan to return to [facility] for possible long term wean attempts - cont bronchodilator therapy - on once weekly Prolastin for alpha 1 antitrypsin deficiency, started on 09/04/17>> (will require replacement of port-a-cath after abx tx completed)  DM2. CBG well controlled at this time  Hypertension Blood pressure borderline low at this time. Mild tachycardia, cont low dose BB . Monitor   Chronic multifactorial normocytic anemia. Hg is low at 6.6, symptomatic anemia, mild tachycardia. No evidence of acute blood loss. D/w patient, will TF 1U. Monitor   Depression Continueclonazepam, quetiapine, buspirone, as needed lorazepam   Code Status: full Family Communication:  D/w patient, RN (indicate person spoken with, relationship, and if by phone, the number) Disposition Plan: pend ltec   Significant Events: 4/26 admit 4/30 Port-a-cath removed in OR 5/7 PICC placed   Consultants:  pccm  Id  Surgery   Antibiotics: Anti-infectives (From admission, onward)   Start     Dose/Rate Route Frequency Ordered Stop   08/26/17 2000  levofloxacin (LEVAQUIN) tablet 500 mg     500 mg Per Tube Daily 08/26/17 1053 08/26/17 2040   08/26/17 0845  vancomycin (VANCOCIN) IVPB 750 mg/150 ml premix     750 mg 150 mL/hr over 60 Minutes Intravenous Every 12 hours 08/26/17 0844 09/10/17 2359   08/24/17 0400  vancomycin (VANCOCIN) IVPB 750 mg/150 ml premix  Status:  Discontinued     750 mg 150 mL/hr over 60 Minutes Intravenous Every 8 hours 08/23/17 1925 08/25/17 1324   08/23/17 2000  vancomycin (VANCOCIN) 1,250 mg in sodium chloride 0.9 % 250 mL IVPB     1,250 mg 166.7 mL/hr over 90 Minutes Intravenous  Once 08/23/17 1925 08/23/17 2328   08/22/17 2330  levofloxacin (LEVAQUIN) IVPB 750 mg  Status:  Discontinued     750 mg 100 mL/hr over 90 Minutes Intravenous Daily at bedtime 08/22/17 2240 08/26/17 1053      (indicate start date, and stop date if known)  HPI/Subjective: Reports feeling "ok". Afebrile. Still having mild high output in rectal tube   Objective: Vitals:   09/04/17 0600 09/04/17 0754  BP: 107/60   Pulse: (!) 108   Resp: (!) 21   Temp:    SpO2: 98% 99%    Intake/Output Summary (Last 24 hours) at 09/04/2017 0755 Last data filed at 09/04/2017 0600 Gross per 24 hour  Intake 2620.67 ml  Output 2500 ml  Net 120.67 ml   Filed Weights   09/03/17 0230 09/03/17 2157 09/04/17 0448  Weight: 77.3 kg (170 lb 6.7 oz) 78.1 kg (172 lb 2.9 oz) 78.1 kg (172 lb 2.9 oz)    Exam:   General: chronically ill   Cardiovascular: s1,s2 rrr  Respiratory:  diminished in LL  Abdomen: soft, nt   Musculoskeletal: no leg edema   Data Reviewed: Basic Metabolic Panel: Recent Labs  Lab 08/29/17 0547 08/31/17 0241 09/01/17 0343 09/02/17 0601  NA  --  139 142 143  K  --  4.0 3.7 3.6  CL  --  97* 98* 98*  CO2  --  34* 36* 37*  GLUCOSE  --  82 123* 82  BUN  --  _0 CREATININE 0.52 0.43* 0.51 0.51  CALCIUM  --  8.5* 8.7* 8.6*  MG  --   --   --  2.2   Liver Function Tests: Recent Labs  Lab 09/02/17 0601  AST 19  ALT 22  ALKPHOS 60  BILITOT 0.8  PROT  5.0*  ALBUMIN 2.6*   No results for input(s): LIPASE, AMYLASE in the last 168 hours. No results for input(s): AMMONIA in the last 168 hours. CBC: Recent Labs  Lab 08/31/17 0241 09/01/17 0343 09/02/17 0601 09/03/17 0429 09/04/17 0450 09/04/17 0631  WBC 14.6* 13.0* 11.7* 10.1 8.5  --   NEUTROABS 11.7* 10.3*  --   --   --   --   HGB 7.8* 7.3* 7.4* 7.1* 6.6* 6.9*  HCT 28.6* 28.5* 28.2* 25.7* 23.8* 24.9*  MCV 86.4 86.6 87.3 85.7 85.3  --   PLT 156 228 194 171 153  --    Cardiac Enzymes: No results for input(s): CKTOTAL, CKMB, CKMBINDEX, TROPONINI in the last 168 hours. BNP (last 3 results) No results for input(s): BNP in the last 8760 hours.  ProBNP (last 3 results) No results for input(s): PROBNP in the last 8760 hours.  CBG: Recent Labs  Lab 09/02/17 2209 09/03/17 0713 09/03/17 1149 09/03/17 1500 09/03/17 2149  GLUCAP 118* 118* 178* 116* 116*    Recent Results (from the past 240 hour(s))  Culture, blood (Routine X 2) w Reflex to ID Panel     Status: None   Collection Time: 08/27/17  9:06 AM  Result Value Ref Range Status   Specimen Description BLOOD RIGHT HAND  Final   Special Requests   Final    BOTTLES DRAWN AEROBIC ONLY Blood Culture results may not be optimal due to an inadequate volume of blood received in culture bottles   Culture   Final    NO GROWTH 5 DAYS Performed at [facility], [place] [address], [place], [place] [address]    Report Status 09/01/2017 FINAL  Final  C difficile quick scan w PCR reflex     Status: Abnormal   Collection Time: 08/27/17 12:36 PM  Result Value Ref Range Status   C Diff antigen POSITIVE (A) NEGATIVE Final   C Diff toxin NEGATIVE NEGATIVE Final   C Diff interpretation Results are indeterminate. See PCR results.  Final  C. Diff by PCR, Reflexed     Status: None   Collection Time: 08/27/17 12:36 PM  Result Value Ref Range Status   Toxigenic C. Difficile by PCR NEGATIVE NEGATIVE Final    Comment: Patient is colonized  with non toxigenic C. difficile. May not need treatment unless significant symptoms are present. Performed at [facility], [place] [address], [place], [place] [address]      Studies: No results found.  Scheduled Meds: . acidophilus  1 capsule Oral Daily  . arformoterol  15 mcg Nebulization BID  . budesonide (PULMICORT) nebulizer solution  0.5 mg Nebulization BID  . busPIRone  15 mg Per Tube BID  .  chlorhexidine gluconate (MEDLINE KIT)  15 mL Mouth Rinse BID  . Chlorhexidine Gluconate Cloth  6 each Topical Daily  . Chlorhexidine Gluconate Cloth  6 each Topical Daily  . clonazepam  1 mg Per Tube TID  . doxepin  10 mg Per Tube QHS  . enoxaparin (LOVENOX) injection  40 mg Subcutaneous Q24H  . famotidine  20 mg Per Tube BID  . free water  200 mL Per Tube Q4H  . insulin aspart  0-5 Units Subcutaneous QHS  . insulin aspart  0-9 Units Subcutaneous TID WC  . insulin glargine  18 Units Subcutaneous QHS  . ipratropium-albuterol  3 mL Nebulization Q6H  . mouth rinse  15 mL Mouth Rinse QID  . metoprolol tartrate  12.5 mg Per Tube BID  . montelukast  10 mg Per Tube QHS  . multivitamin  15 mL Per Tube Daily  . PARoxetine  30 mg Per Tube Daily  . polyethylene glycol  17 g Per Tube Daily  . QUEtiapine  50 mg Per Tube BID  . sodium chloride flush  10-40 mL Intracatheter Q12H   Continuous Infusions: . alpha-1 proteinase inhibitor (PROLASTIN-C) infusion    . feeding supplement (VITAL AF 1.2 CAL) 60 mL/hr at 09/04/17 0600  . vancomycin Stopped (09/03/17 2241)    Active Problems:   Hypercarbia   Acute on chronic respiratory failure with hypoxia and hypercapnia (HCC)   COPD exacerbation (HCC)   VAP (ventilator-associated pneumonia) (HCC)   Hypotension   Hypoxemia   Tracheostomy status ([place])   Alpha-1-antitrypsin deficiency ([place])    Time spent: >25 minutes     Kinnie Feil  [facility] Pager [phone]. If 7PM-7AM, please contact night-coverage at [email or web address],  password [facility] 09/04/2017, 7:55 AM  LOS: 13 days

## 2017-09-04 NOTE — Progress Notes (Signed)
CRITICAL VALUE ALERT  Critical Value:  Hemoglobin 6.6  Date & Time Notied:  09/04/2017 0545  Provider Notified: Pola Corn  Orders Received/Actions taken: awaiting orders

## 2017-09-04 NOTE — Progress Notes (Signed)
I have seen and examined the patient with nurse practitioner/resident and agree with the note above.  We formulated the plan together and I elicited the following history.    Subjective: Awake No acute issues.  Appears comfortable on ATC  Objective: Vitals:   09/04/17 1227 09/04/17 1300 09/04/17 1339 09/04/17 1400  BP:  112/70  119/66  Pulse: (!) 108 (!) 110  (!) 101  Resp: 20 19  (!) 24  Temp: 98.9 F (37.2 C)     TempSrc: Oral     SpO2: 99% 100% 99% 97%  Weight:      Height:       Vent Mode: PRVC FiO2 (%):  [40 %] 40 % Set Rate:  [12 bmp] 12 bmp Vt Set:  [380 mL] 380 mL PEEP:  [5 cmH20] 5 cmH20 Pressure Support:  [8 cmH20] 8 cmH20 Plateau Pressure:  [16 cmH20-21 cmH20] 17 cmH20  Intake/Output Summary (Last 24 hours) at 09/04/2017 1446 Last data filed at 09/04/2017 1300 Gross per 24 hour  Intake 3100.33 ml  Output 2400 ml  Net 700.33 ml   General: 47 year old [place] female resting comfortably in bed. No distress HENT: trach site unremarkable MMM sclera not icteric Pulm: clear, diminished decreased bases Card RRR abd PEG unremarkable + bowel sounds  Gu: clear yellow  Neuro: intact moves all extremities   CBC    Component Value Date/Time   WBC 8.5 09/04/2017 0450   RBC 2.79 (L) 09/04/2017 0450   HGB 8.4 (L) 09/04/2017 1413   HCT 29.4 (L) 09/04/2017 1413   PLT 153 09/04/2017 0450   MCV 85.3 09/04/2017 0450   MCH 23.7 (L) 09/04/2017 0450   MCHC 27.7 (L) 09/04/2017 0450   RDW 21.0 (H) 09/04/2017 0450   LYMPHSABS 1.4 09/01/2017 0343   MONOABS 0.9 09/01/2017 0343   EOSABS 0.4 09/01/2017 0343   BASOSABS 0.0 09/01/2017 0343    BMET    Component Value Date/Time   NA 143 09/02/2017 0601   K 3.6 09/02/2017 0601   CL 98 (L) 09/02/2017 0601   CO2 37 (H) 09/02/2017 0601   GLUCOSE 82 09/02/2017 0601   BUN 16 09/02/2017 0601   CREATININE 0.51 09/02/2017 0601   CALCIUM 8.6 (L) 09/02/2017 0601   GFRNONAA >60 09/02/2017 0601   GFRAA >60 09/02/2017 0601     Impression/Plan:  Severe chronic respiratory failure with 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 Had been on prolastin replacement weekly thru [facility].  Ventilator dependence Trach dependence  Coag neg SA bacteremia d/t indwelling IV access site. Now removed.   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  PCCM was consulted for vent management and management of above.  On exam, coarse BS diffusely with clean trach site.  I reviewed CXR myself, trach is in good position and hyperinflation noted.  Discussed with PCCM-NP.  Chronic respiratory failure:  - Full vent support with daily PS trials but no TC.  Hypoxemia:   - Titrate O2 for sat of 88-92%  COPD:  - BD as ordered  - ICS as ordered  Alpha-1-anti-trypsin deficiency:   - Will need to continue medications even though respiratory failure has already set in.  Will prevent further progression.  Trach status:  - Maintain current trach size and type, no down size and keep cuff up.  PCCM will continue to follow for vent/trach/a1at.  Likely weekly at this point.  Alyson Reedy, M.D. [facility] Pulmonary/Critical  Care Medicine. Pager: [phone]. After hours pager: [phone].

## 2017-09-04 NOTE — Progress Notes (Signed)
CSW informed by [facility] and [facility] that they are unable to accept patient on Prolastin  Still awaiting determination if [facility] can accept back  Burna Sis, LCSW Clinical Social Worker [phone]

## 2017-09-05 LAB — CBC
HEMATOCRIT: 27.9 % — AB (ref 36.0–46.0)
HEMOGLOBIN: 8 g/dL — AB (ref 12.0–15.0)
MCH: 24.4 pg — ABNORMAL LOW (ref 26.0–34.0)
MCHC: 28.7 g/dL — ABNORMAL LOW (ref 30.0–36.0)
MCV: 85.1 fL (ref 78.0–100.0)
Platelets: 145 10*3/uL — ABNORMAL LOW (ref 150–400)
RBC: 3.28 MIL/uL — AB (ref 3.87–5.11)
RDW: 20.4 % — ABNORMAL HIGH (ref 11.5–15.5)
WBC: 7.9 10*3/uL (ref 4.0–10.5)

## 2017-09-05 LAB — TYPE AND SCREEN
ABO/RH(D): A POS
ANTIBODY SCREEN: NEGATIVE
UNIT DIVISION: 0

## 2017-09-05 LAB — BPAM RBC
Blood Product Expiration Date: 201905282359
ISSUE DATE / TIME: 201905091022
UNIT TYPE AND RH: 6200

## 2017-09-05 LAB — GLUCOSE, CAPILLARY
GLUCOSE-CAPILLARY: 130 mg/dL — AB (ref 65–99)
GLUCOSE-CAPILLARY: 148 mg/dL — AB (ref 65–99)
GLUCOSE-CAPILLARY: 86 mg/dL (ref 65–99)
Glucose-Capillary: 91 mg/dL (ref 65–99)

## 2017-09-05 NOTE — Progress Notes (Signed)
RT NOTE:  Chest PT held. Patient sitting up in chair at this time

## 2017-09-05 NOTE — Progress Notes (Signed)
Physical Therapy Treatment Patient Details Name: Danielle Stevens MRN: [id number] DOB: [birth year] Today's Date: 09/05/2017    History of Present Illness Pt is a 46 y.o. female with alpha-1 antitrypsin deficiency and has ventilator dependent respiratory failure, admitted from [facility] on 08/22/17 due to sudden unresponsiveness. Found to have worsening acidosis in setting of mucous plug from purulent bronchitis; also with MRSE bacteremia and hypotension. PMH includes anxiety, asthma, COPD, DM, emphysema lung, HTN. Of note, recently admitted 05/16/17-06/03/17 for COPD exacerbation requiring intubation; tracheostomy done later with d/c to SNF [facility].     PT Comments    Pt admitted with above diagnosis. Pt currently with functional limitations due to balance and endurance deficits. Pt was able to transfer stand pivot with 2 person assist.  Needs incr time to recover from each movement with very poor endurance overall.  Will continue acute PT.   Pt will benefit from skilled PT to increase their independence and safety with mobility to allow discharge to the venue listed below.    Follow Up Recommendations  SNF     Equipment Recommendations  None recommended by PT    Recommendations for Other Services       Precautions / Restrictions Precautions Precautions: Fall;Other (comment) Precaution Comments: trach/vent, peg tube, flexiseal, high anxiety Restrictions Weight Bearing Restrictions: No    Mobility  Bed Mobility Overal bed mobility: Needs Assistance Bed Mobility: Rolling Rolling: Mod assist;+2 for physical assistance   Supine to sit: Mod assist;+2 for physical assistance     General bed mobility comments: mod assist for LEs to EOB and trunk elevation as well as for safety, used rails;  Transfers Overall transfer level: Needs assistance Equipment used: 2 person hand held assist Transfers: Stand Pivot Transfers   Stand pivot transfers: Max assist;+2 physical assistance       General transfer comment: Nervous to get up, required max encouragement; Tried to use Stedy but the one on unit is a standard Stedy and pt needs baratric Stedy therefore transferred with  2 person assist to power up and turn to chair with use of pad; did not tolerate much time in standing.  Took 3-4 min to recover DOE after transfer and needed to give pt O2 breaths on vent as her sats were down to 85% and HR was 142 bpm..  Sats 94% after pt rested 5 min.  PEEP 5 with FiO2 40%.  BP's stable with initial BP 102/70 and last BP taken was 123/87.    Ambulation/Gait             General Gait Details: unable   Stairs             Wheelchair Mobility    Modified Rankin (Stroke Patients Only)       Balance Overall balance assessment: Needs assistance Sitting-balance support: Bilateral upper extremity supported;Feet unsupported Sitting balance-Leahy Scale: Poor Sitting balance - Comments: UE support and max assist to sit EOB due to feet not touching floor   Standing balance support: During functional activity;Bilateral upper extremity supported Standing balance-Leahy Scale: Poor Standing balance comment: Needed assist of UEs and max external support to transfer stand pivot to chair as well as use of pad to hold pt into anterior and upright position.                            Cognition Arousal/Alertness: Awake/alert Behavior During Therapy: WFL for tasks assessed/performed;Anxious Overall Cognitive Status: Difficult to assess  General Comments: Pt nodding yes/no and mouthing responses to questions appropriately. Follows commands      Exercises General Exercises - Lower Extremity Ankle Circles/Pumps: AROM;Both;20 reps;Supine Heel Slides: AROM;Both;Supine;10 reps    General Comments        Pertinent Vitals/Pain Pain Assessment: Faces Faces Pain Scale: Hurts even more Pain Location: generalized Pain Descriptors /  Indicators: Sore Pain Intervention(s): Limited activity within patient's tolerance;Monitored during session;Premedicated before session;Repositioned    Home Living                      Prior Function            PT Goals (current goals can now be found in the care plan section) Acute Rehab PT Goals Patient Stated Goal: not stated Progress towards PT goals: Progressing toward goals    Frequency    Min 2X/week      PT Plan Current plan remains appropriate    Co-evaluation              AM-PAC PT "6 Clicks" Daily Activity  Outcome Measure  Difficulty turning over in bed (including adjusting bedclothes, sheets and blankets)?: A Little Difficulty moving from lying on back to sitting on the side of the bed? : A Lot Difficulty sitting down on and standing up from a chair with arms (e.g., wheelchair, bedside commode, etc,.)?: A Lot Help needed moving to and from a bed to chair (including a wheelchair)?: A Lot Help needed walking in hospital room?: Total Help needed climbing 3-5 steps with a railing? : Total 6 Click Score: 11    End of Session Equipment Utilized During Treatment: Oxygen;Gait belt(vent with trach) Activity Tolerance: Patient limited by fatigue Patient left: in chair;with call bell/phone within reach;with chair alarm set Nurse Communication: Mobility status;Need for lift equipment PT Visit Diagnosis: Other abnormalities of gait and mobility (R26.89);Muscle weakness (generalized) (M62.81)     Time: 1610-9604 PT Time Calculation (min) (ACUTE ONLY): 43 min  Charges:  $Therapeutic Exercise: 8-22 mins $Therapeutic Activity: 23-37 mins                    G Codes:       Danielle Stevens,PT Acute Rehabilitation [phone] (pager)    Berline Lopes 09/05/2017, 12:04 PM

## 2017-09-05 NOTE — Progress Notes (Signed)
Visited with patient-and asked if she would like a visit and about her concerns-her health, 4 children and to have peace of mind.  Her children are grown. . I asked yes or no questions and she could shake head and show how many fingers-kids.  Had prayer with her to not be be worried about things and for the staff as they care for her and others and prayed for the other needs she was not able to express. Phebe Colla, Chaplain   09/05/17 1400  Clinical Encounter Type  Visited With Patient  Visit Type Initial;Spiritual support;Critical Care  Referral From Nurse  Consult/Referral To Chaplain  Spiritual Encounters  Spiritual Needs Prayer;Emotional  Stress Factors  Patient Stress Factors None identified  Family Stress Factors None identified

## 2017-09-05 NOTE — Progress Notes (Signed)
[place] TEAM 1 - Stepdown/ICU TEAM  Danielle Stevens  URK:[id number] DOB: 06/17/1970 DOA: 08/22/2017 PCP: Patient, No Pcp Per    Brief Narrative:  34 year oldfemale LTACH resident w/ a hx of alpha-1 antitrypsin deficiency and chronic ventilator dependent respiratory failure who presented unresponsive due to acute hypercapnic respiratory failure due to mucous plugging.  By the time she arrived in the ED EMS had actually essentially corrected her acute resp decline.      Significant Events: 4/26 admit 4/30 Port-a-cath removed in OR 5/7 PICC placed   Subjective: Being moved in sky-lift by staffing.  Has no new complaints.  Respirations comfortable on vent.     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 Port removed 4/30 - continue IV antibiotic therapy through 09/10/17 as per ID suggestions   Acute on chronic hypercapnic respiratory failure - COPD + A1 AT deficiency / advanced lung disease - chronic tracheostomy  Cont full vent support w/ no attempts to wean - plan to return to [facility] SNF for possible long term wean attempts - cont bronchodilator therapy - on once weekly Prolastin for alpha 1 antitrypsin deficiency (will require replacement of port-a-cath after abx tx completed)  DM2 CBG well controlled  Hypertension Blood pressure borderline low but stable   Chronic multifactorial normocytic anemia Transfused 1U PRBC 5/9 - Hgb stable - no evidence of blood loss   Depression Continue clonazepam, quetiapine, buspirone, as needed lorazepam   DVT prophylaxis: lovenox  Code Status: FULL CODE Family Communication: no family present at time of exam  Disposition Plan: d/c to Vent SNF for chronic vent wean (pt does not wish to return to [facility])  Consultants:  PCCM ID Gen Surgery   Antimicrobials:  Levofloxacin 4/26 > 4/30 Vancomycin 4/27 >  Objective: Blood pressure 103/69, pulse 87, temperature 99.7 F (37.6 C), temperature source  Axillary, resp. rate 18, height '5\' 4"'$  (1.626 m), weight 77.8 kg (171 lb 8.3 oz), SpO2 97 %.  Intake/Output Summary (Last 24 hours) at 09/05/2017 1314 Last data filed at 09/05/2017 1300 Gross per 24 hour  Intake 2440 ml  Output 1800 ml  Net 640 ml   Filed Weights   09/03/17 2157 09/04/17 0448 09/05/17 0219  Weight: 78.1 kg (172 lb 2.9 oz) 78.1 kg (172 lb 2.9 oz) 77.8 kg (171 lb 8.3 oz)    Examination: General: alert and interactive  Lungs: No acute resp distress  Extremities: 1+ diffuse edema   CBC: Recent Labs  Lab 08/31/17 0241 09/01/17 0343  09/03/17 0429 09/04/17 0450 09/04/17 0631 09/04/17 1413 09/05/17 0434  WBC 14.6* 13.0*   < > 10.1 8.5  --   --  7.9  NEUTROABS 11.7* 10.3*  --   --   --   --   --   --   HGB 7.8* 7.3*   < > 7.1* 6.6* 6.9* 8.4* 8.0*  HCT 28.6* 28.5*   < > 25.7* 23.8* 24.9* 29.4* 27.9*  MCV 86.4 86.6   < > 85.7 85.3  --   --  85.1  PLT 156 228   < > 171 153  --   --  145*   < > = values in this interval not displayed.   Basic Metabolic Panel: Recent Labs  Lab 08/31/17 0241 09/01/17 0343 09/02/17 0601  NA 139 142 143  K 4.0 3.7 3.6  CL 97* 98* 98*  CO2 34* 36* 37*  GLUCOSE 82 123* 82  BUN 16 14 16  CREATININE 0.43* 0.51 0.51  CALCIUM 8.5* 8.7* 8.6*  MG  --   --  2.2   GFR: Estimated Creatinine Clearance: 86.8 mL/min (by C-G formula based on SCr of 0.51 mg/dL).  Liver Function Tests: Recent Labs  Lab 09/02/17 0601  AST 19  ALT 22  ALKPHOS 60  BILITOT 0.8  PROT 5.0*  ALBUMIN 2.6*    CBG: Recent Labs  Lab 09/04/17 1052 09/04/17 1707 09/04/17 2134 09/05/17 0712 09/05/17 1058  GLUCAP 158* 75 111* 130* 148*    Recent Results (from the past 240 hour(s))  Culture, blood (Routine X 2) w Reflex to ID Panel     Status: None   Collection Time: 08/27/17  9:06 AM  Result Value Ref Range Status   Specimen Description BLOOD RIGHT HAND  Final   Special Requests   Final    BOTTLES DRAWN AEROBIC ONLY Blood Culture results may not be  optimal due to an inadequate volume of blood received in culture bottles   Culture   Final    NO GROWTH 5 DAYS Performed at [facility], [place] [address] [place], [place] [address]    Report Status 09/01/2017 FINAL  Final  C difficile quick scan w PCR reflex     Status: Abnormal   Collection Time: 08/27/17 12:36 PM  Result Value Ref Range Status   C Diff antigen POSITIVE (A) NEGATIVE Final   C Diff toxin NEGATIVE NEGATIVE Final   C Diff interpretation Results are indeterminate. See PCR results.  Final  C. Diff by PCR, Reflexed     Status: None   Collection Time: 08/27/17 12:36 PM  Result Value Ref Range Status   Toxigenic C. Difficile by PCR NEGATIVE NEGATIVE Final    Comment: Patient is colonized with non toxigenic C. difficile. May not need treatment unless significant symptoms are present. Performed at [facility], [place] [address], [place], [place] [address]      Scheduled Meds: . acidophilus  1 capsule Oral Daily  . arformoterol  15 mcg Nebulization BID  . budesonide (PULMICORT) nebulizer solution  0.5 mg Nebulization BID  . busPIRone  15 mg Per Tube BID  . chlorhexidine gluconate (MEDLINE KIT)  15 mL Mouth Rinse BID  . Chlorhexidine Gluconate Cloth  6 each Topical Daily  . Chlorhexidine Gluconate Cloth  6 each Topical Daily  . clonazepam  1 mg Per Tube TID  . doxepin  10 mg Per Tube QHS  . enoxaparin (LOVENOX) injection  40 mg Subcutaneous Q24H  . famotidine  20 mg Per Tube BID  . free water  200 mL Per Tube Q4H  . insulin aspart  0-5 Units Subcutaneous QHS  . insulin aspart  0-9 Units Subcutaneous TID WC  . insulin glargine  18 Units Subcutaneous QHS  . ipratropium-albuterol  3 mL Nebulization Q6H  . mouth rinse  15 mL Mouth Rinse QID  . metoprolol tartrate  12.5 mg Per Tube BID  . montelukast  10 mg Per Tube QHS  . multivitamin  15 mL Per Tube Daily  . PARoxetine  30 mg Per Tube Daily  . polyethylene glycol  17 g Per Tube Daily  . QUEtiapine  50 mg Per  Tube BID  . sodium chloride flush  10-40 mL Intracatheter Q12H     LOS: 14 days   Cherene Altes, MD [facility] Office  [phone] Pager - Text Page per Shea Evans as per below:  On-Call/Text Page:  [email or web address]      password [email or web address]  If 7PM-7AM, please contact night-coverage [email or web address] Password [email or web address] 09/05/2017, 1:14 PM

## 2017-09-06 LAB — CBC
HCT: 28.2 % — ABNORMAL LOW (ref 36.0–46.0)
Hemoglobin: 7.9 g/dL — ABNORMAL LOW (ref 12.0–15.0)
MCH: 24.2 pg — AB (ref 26.0–34.0)
MCHC: 28 g/dL — ABNORMAL LOW (ref 30.0–36.0)
MCV: 86.2 fL (ref 78.0–100.0)
Platelets: 141 10*3/uL — ABNORMAL LOW (ref 150–400)
RBC: 3.27 MIL/uL — ABNORMAL LOW (ref 3.87–5.11)
RDW: 20.2 % — AB (ref 11.5–15.5)
WBC: 8.2 10*3/uL (ref 4.0–10.5)

## 2017-09-06 LAB — BASIC METABOLIC PANEL
Anion gap: 7 (ref 5–15)
BUN: 8 mg/dL (ref 6–20)
CHLORIDE: 98 mmol/L — AB (ref 101–111)
CO2: 36 mmol/L — AB (ref 22–32)
Calcium: 9 mg/dL (ref 8.9–10.3)
Creatinine, Ser: 0.51 mg/dL (ref 0.44–1.00)
GFR calc Af Amer: >60 mL/min (ref >60)
GFR calc non Af Amer: >60 mL/min (ref >60)
Glucose, Bld: 120 mg/dL — ABNORMAL HIGH (ref 65–99)
POTASSIUM: 3.2 mmol/L — AB (ref 3.5–5.1)
SODIUM: 141 mmol/L (ref 135–145)

## 2017-09-06 LAB — GLUCOSE, CAPILLARY
GLUCOSE-CAPILLARY: 102 mg/dL — AB (ref 65–99)
GLUCOSE-CAPILLARY: 130 mg/dL — AB (ref 65–99)
Glucose-Capillary: 137 mg/dL — ABNORMAL HIGH (ref 65–99)
Glucose-Capillary: 85 mg/dL (ref 65–99)
Glucose-Capillary: 86 mg/dL (ref 65–99)

## 2017-09-06 MED ORDER — METOPROLOL TARTRATE 25 MG/10 ML ORAL SUSPENSION
6.2500 mg | Freq: Two times a day (BID) | ORAL | Status: DC
  Administered 2017-09-08 – 2017-09-09 (×2): 6.25 mg
  Filled 2017-09-06 (×5): qty 10

## 2017-09-06 MED ORDER — CLONAZEPAM 0.5 MG PO TBDP
0.5000 mg | ORAL_TABLET | Freq: Three times a day (TID) | ORAL | Status: DC
  Administered 2017-09-06 – 2017-09-14 (×25): 0.5 mg
  Filled 2017-09-06 (×25): qty 1

## 2017-09-06 NOTE — Plan of Care (Signed)
No changes in condition.  Still awaiting placement.

## 2017-09-06 NOTE — Progress Notes (Signed)
[place] TEAM 1 - Stepdown/ICU TEAM  Jaycey Gens  UVO:[id number] DOB: [birth year] DOA: 08/22/2017 PCP: Patient, No Pcp Per    Brief Narrative:  7 year oldfemale LTACH resident w/ a hx of alpha-1 antitrypsin deficiency and chronic ventilator dependent respiratory failure who presented unresponsive due to acute hypercapnic respiratory failure due to mucous plugging.  By the time she arrived in the ED EMS had actually essentially corrected her acute resp decline.      Significant Events: 4/26 admit 4/30 Port-a-cath removed in OR 5/7 PICC placed   Subjective: No acute changes in status today.  No exam.  Cont to await placement.       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 Port removed 4/30 - continue IV antibiotic therapy through 09/10/17 as per ID suggestions   Acute on chronic hypercapnic respiratory failure - COPD + A1 AT deficiency / advanced lung disease - chronic tracheostomy  Cont full vent support w/ no attempts to wean - [facility] term [facility] attempts - cont bronchodilator therapy - on once weekly Prolastin for alpha 1 antitrypsin deficiency (will require replacement of port-a-cath after abx tx completed)  DM2 CBG well controlled  Hypertension Blood pressure borderline low but stable   Chronic multifactorial normocytic anemia Transfused 1U PRBC 5/9 - Hgb stable - no evidence of blood loss - monitor trend   Depression Continue clonazepam, quetiapine, buspirone, as needed lorazepam   DVT prophylaxis: lovenox  Code Status: FULL CODE Family Communication: no family present at time of exam  Disposition Plan: [facility] (pt does not wish to return to [facility])  Consultants:  PCCM ID Gen Surgery   Antimicrobials:  Levofloxacin 4/26 > 4/30 Vancomycin 4/27 >  Objective: Blood pressure (!) 93/55, pulse 94, temperature 98.9 F (37.2 C), temperature source  Oral, resp. rate (!) 22, height 5' 4" (1.626 m), weight 78.2 kg (172 lb 6.4 oz), SpO2 98 %.  Intake/Output Summary (Last 24 hours) at 09/06/2017 1503 Last data filed at 09/06/2017 1400 Gross per 24 hour  Intake 1400 ml  Output 1170 ml  Net 230 ml   Filed Weights   09/04/17 0448 09/05/17 0219 09/06/17 0500  Weight: 78.1 kg (172 lb 2.9 oz) 77.8 kg (171 lb 8.3 oz) 78.2 kg (172 lb 6.4 oz)    Examination: No exam today   CBC: Recent Labs  Lab 08/31/17 0241 09/01/17 0343  09/04/17 0450  09/04/17 1413 09/05/17 0434 09/06/17 0446  WBC 14.6* 13.0*   < > 8.5  --   --  7.9 8.2  NEUTROABS 11.7* 10.3*  --   --   --   --   --   --   HGB 7.8* 7.3*   < > 6.6*   < > 8.4* 8.0* 7.9*  HCT 28.6* 28.5*   < > 23.8*   < > 29.4* 27.9* 28.2*  MCV 86.4 86.6   < > 85.3  --   --  85.1 86.2  PLT 156 228   < > 153  --   --  145* 141*   < > = values in this interval not displayed.   Basic Metabolic Panel: Recent Labs  Lab 09/01/17 0343 09/02/17 0601 09/06/17 0446  NA 142 143 141  K 3.7 3.6 3.2*  CL 98* 98* 98*  CO2 36* 37* 36*  GLUCOSE 123* 82 120*  BUN _0 CREATININE 0.51  0.51 0.51  CALCIUM 8.7* 8.6* 9.0  MG  --  2.2  --    GFR: Estimated Creatinine Clearance: 87 mL/min (by C-G formula based on SCr of 0.51 mg/dL).  Liver Function Tests: Recent Labs  Lab 09/02/17 0601  AST 19  ALT 22  ALKPHOS 60  BILITOT 0.8  PROT 5.0*  ALBUMIN 2.6*    CBG: Recent Labs  Lab 09/05/17 1058 09/05/17 1631 09/05/17 2033 09/06/17 0825 09/06/17 1056  GLUCAP 148* 86 91 137* 102*     Scheduled Meds: . acidophilus  1 capsule Oral Daily  . arformoterol  15 mcg Nebulization BID  . budesonide (PULMICORT) nebulizer solution  0.5 mg Nebulization BID  . busPIRone  15 mg Per Tube BID  . chlorhexidine gluconate (MEDLINE KIT)  15 mL Mouth Rinse BID  . Chlorhexidine Gluconate Cloth  6 each Topical Daily  . clonazepam  1 mg Per Tube TID  . doxepin  10 mg Per Tube QHS  . enoxaparin (LOVENOX)  injection  40 mg Subcutaneous Q24H  . famotidine  20 mg Per Tube BID  . free water  200 mL Per Tube Q4H  . insulin aspart  0-5 Units Subcutaneous QHS  . insulin aspart  0-9 Units Subcutaneous TID WC  . insulin glargine  18 Units Subcutaneous QHS  . ipratropium-albuterol  3 mL Nebulization Q6H  . mouth rinse  15 mL Mouth Rinse QID  . metoprolol tartrate  12.5 mg Per Tube BID  . montelukast  10 mg Per Tube QHS  . multivitamin  15 mL Per Tube Daily  . PARoxetine  30 mg Per Tube Daily  . polyethylene glycol  17 g Per Tube Daily  . QUEtiapine  50 mg Per Tube BID  . sodium chloride flush  10-40 mL Intracatheter Q12H     LOS: 15 days   Cherene Altes, MD [facility] Office  [phone] Pager - Text Page per Amion as per below:  On-Call/Text Page:      [email or web address]      password [email or web address]  If 7PM-7AM, please contact night-coverage [email or web address] Password [facility] 09/06/2017, 3:03 PM

## 2017-09-06 NOTE — Progress Notes (Signed)
Pharmacy Antibiotic Note  Danielle Stevens is a 47 y.o. female admitted on 08/22/2017 with MRSE bacteremia to s/p removal of port-a-cath. Remains on vancomycin, planning to continue until 5/15. Renal function remains stable. Pt is afebrile and WBC is WNL.   Plan: Continue vancomycin 750mg  IV Q12H F/u renal fxn, C&S, clinical status and weekly trough   Height: 5\' 4"  (162.6 cm) Weight: 172 lb 6.4 oz (78.2 kg) IBW/kg (Calculated) : 54.7  Temp (24hrs), Avg:99.1 F (37.3 C), Min:98.9 F (37.2 C), Max:99.2 F (37.3 C)  Recent Labs  Lab 08/31/17 0241 08/31/17 2116 09/01/17 0343 09/02/17 0601 09/03/17 0429 09/04/17 0450 09/05/17 0434 09/06/17 0446  WBC 14.6*  --  13.0* 11.7* 10.1 8.5 7.9 8.2  CREATININE 0.43*  --  0.51 0.51  --   --   --  0.51  VANCOTROUGH  --  17  --   --   --   --   --   --     Estimated Creatinine Clearance: 87 mL/min (by C-G formula based on SCr of 0.51 mg/dL).    Allergies  Allergen Reactions  . Wasp Venom Protein Shortness Of Breath  . Adhesive [Tape] Other (See Comments)    Bruises   . Coconut Oil Hives  . Latex Other (See Comments)    Bruises   . Robitussin Severe Multi-Symp [Phenylephrine-Dm-Gg-Apap] Diarrhea and Other (See Comments)    Allergic, per verbal MAR  . Shellfish-Derived Products Hives and Other (See Comments)    Allergic, per verbal MAR    Levaquin 4/26 >> 4/30 Vancomycin 4/27 >>  4/26 TA: normal resp flora 4/26 BCx: 2/2 CONS 4/26 BCID: MRSE 4/26 MRSA PCR: pos 5/1 cdif antigen +, tox neg 5/1 blood x 1 > ngF   Danielle Stevens, Danielle Stevens 09/06/2017 12:51 PM

## 2017-09-07 ENCOUNTER — Inpatient Hospital Stay (HOSPITAL_COMMUNITY): Payer: Medicare Other

## 2017-09-07 LAB — GLUCOSE, CAPILLARY
GLUCOSE-CAPILLARY: 153 mg/dL — AB (ref 65–99)
GLUCOSE-CAPILLARY: 154 mg/dL — AB (ref 65–99)
GLUCOSE-CAPILLARY: 119 mg/dL — AB (ref 65–99)
GLUCOSE-CAPILLARY: 116 mg/dL — AB (ref 65–99)

## 2017-09-07 LAB — BASIC METABOLIC PANEL
Anion gap: 6 (ref 5–15)
BUN: 8 mg/dL (ref 6–20)
CO2: 37 mmol/L — ABNORMAL HIGH (ref 22–32)
Calcium: 8.7 mg/dL — ABNORMAL LOW (ref 8.9–10.3)
Chloride: 100 mmol/L — ABNORMAL LOW (ref 101–111)
Creatinine, Ser: 0.48 mg/dL (ref 0.44–1.00)
GFR calc Af Amer: >60 mL/min (ref >60)
GFR calc non Af Amer: >60 mL/min (ref >60)
Glucose, Bld: 159 mg/dL — ABNORMAL HIGH (ref 65–99)
Potassium: 3.1 mmol/L — ABNORMAL LOW (ref 3.5–5.1)
Sodium: 143 mmol/L (ref 135–145)

## 2017-09-07 LAB — CBC
HCT: 26.3 % — ABNORMAL LOW (ref 36.0–46.0)
Hemoglobin: 7.4 g/dL — ABNORMAL LOW (ref 12.0–15.0)
MCH: 24.3 pg — ABNORMAL LOW (ref 26.0–34.0)
MCHC: 28.1 g/dL — ABNORMAL LOW (ref 30.0–36.0)
MCV: 86.2 fL (ref 78.0–100.0)
Platelets: 131 10*3/uL — ABNORMAL LOW (ref 150–400)
RBC: 3.05 MIL/uL — ABNORMAL LOW (ref 3.87–5.11)
RDW: 20.1 % — ABNORMAL HIGH (ref 11.5–15.5)
WBC: 6.7 10*3/uL (ref 4.0–10.5)

## 2017-09-07 LAB — MAGNESIUM: Magnesium: 2.1 mg/dL (ref 1.7–2.4)

## 2017-09-07 MED ORDER — ONDANSETRON HCL 4 MG/2ML IJ SOLN
4.0000 mg | Freq: Four times a day (QID) | INTRAMUSCULAR | Status: DC | PRN
  Administered 2017-09-07 – 2017-09-08 (×3): 4 mg via INTRAVENOUS
  Filled 2017-09-07 (×3): qty 2

## 2017-09-07 MED ORDER — PROMETHAZINE HCL 25 MG/ML IJ SOLN: 12.5000 mg | Freq: Once | INTRAMUSCULAR | Status: DC

## 2017-09-07 MED ORDER — POTASSIUM CHLORIDE 20 MEQ/15ML (10%) PO SOLN
40.0000 meq | Freq: Once | ORAL | Status: AC
Start: 2017-09-07 — End: 2017-09-07
  Administered 2017-09-07: 40 meq
  Filled 2017-09-07: qty 30

## 2017-09-07 NOTE — Progress Notes (Signed)
TEAM 1 - Stepdown/ICU TEAM  Danielle Stevens  LSL:[id number] DOB: 09-04-1970 DOA: 08/22/2017 PCP: Patient, No Pcp Per    Brief Narrative:  39 year oldfemale LTACH resident w/ a hx of alpha-1 antitrypsin deficiency and chronic ventilator dependent respiratory failure who presented unresponsive due to acute hypercapnic respiratory failure due to mucous plugging.  By the time she arrived in the ED EMS had actually essentially corrected her acute resp decline.      Significant Events: 4/26 admit 4/30 Port-a-cath removed in OR 5/7 PICC placed   Subjective: The patient is resting comfortably in bed.  She denies shortness of breath chest pain or abdominal pain at the time of visit.  There is no evidence of acute distress.     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 Port removed 4/30 - continue IV antibiotic therapy through 09/10/17 as per ID suggestions   Acute on chronic hypercapnic respiratory failure - COPD + A1 AT deficiency / advanced lung disease - chronic tracheostomy  Cont full vent support w/ no attempts to wean - plan [facility] possible [facility] term [facility] attempts - cont bronchodilator therapy - on once weekly Prolastin for alpha 1 antitrypsin deficiency (will require replacement of port-a-cath after abx tx completed)  DM2 CBG controlled  Hypertension Blood pressure borderline low but stable   Hypokalemia  Supplement and monitor - check Mg  Chronic multifactorial normocytic anemia Transfused 1U PRBC 5/9 - Hgb may be drifting down - no evidence of signif acute loss - monitor trend   Depression Continue clonazepam, quetiapine, buspirone, as needed lorazepam   DVT prophylaxis: lovenox  Code Status: FULL CODE Family Communication: no family present at time of exam  Disposition Plan: [facility] (pt does not wish to return to [facility])  Consultants:  PCCM ID Gen Surgery    Antimicrobials:  Levofloxacin 4/26 > 4/30 Vancomycin 4/27 >  Objective: Blood pressure 107/65, pulse (!) 110, temperature 98.7 F (37.1 C), temperature source Oral, resp. rate 17, height _0  (1.626 m), weight 79.2 kg (174 lb 9.7 oz), SpO2 98 %.  Intake/Output Summary (Last 24 hours) at 09/07/2017 1731 Last data filed at 09/07/2017 1700 Gross per 24 hour  Intake 2340 ml  Output 1450 ml  Net 890 ml   Filed Weights   09/05/17 0219 09/06/17 0500 09/07/17 0417  Weight: 77.8 kg (171 lb 8.3 oz) 78.2 kg (172 lb 6.4 oz) 79.2 kg (174 lb 9.7 oz)    Examination: General: No acute respiratory distress on vent via trach  Lungs: Clear to auscultation bilaterally Cardiovascular: Regular rate and rhythm  Abdomen: Nontender, nondistended, soft, bowel sounds positive Extremities: No significant edema bilateral lower extremities   CBC: Recent Labs  Lab 09/01/17 0343  09/05/17 0434 09/06/17 0446 09/07/17 0410  WBC 13.0*   < > 7.9 8.2 6.7  NEUTROABS 10.3*  --   --   --   --   HGB 7.3*   < > 8.0* 7.9* 7.4*  HCT 28.5*   < > 27.9* 28.2* 26.3*  MCV 86.6   < > 85.1 86.2 86.2  PLT 228   < > 145* 141* 131*   < > = values in this interval not displayed.   Basic Metabolic Panel: Recent Labs  Lab 09/02/17 0601 09/06/17 0446 09/07/17 0410  NA 143 141 143  K 3.6 3.2* 3.1*  CL 98* 98* 100*  CO2 37* 36* 37*  GLUCOSE 82 120* 159*  BUN _0 CREATININE 0.51 0.51 0.48  CALCIUM 8.6* 9.0 8.7*  MG 2.2  --  2.1   GFR: Estimated Creatinine Clearance: 87.6 mL/min (by C-G formula based on SCr of 0.48 mg/dL).  Liver Function Tests: Recent Labs  Lab 09/02/17 0601  AST 19  ALT 22  ALKPHOS 60  BILITOT 0.8  PROT 5.0*  ALBUMIN 2.6*    CBG: Recent Labs  Lab 09/06/17 2151 09/06/17 2153 09/07/17 0756 09/07/17 1129 09/07/17 1640  GLUCAP 85 86 153* 154* 119*     Scheduled Meds: . acidophilus  1 capsule Oral Daily  . arformoterol  15 mcg Nebulization BID  . budesonide (PULMICORT)  nebulizer solution  0.5 mg Nebulization BID  . busPIRone  15 mg Per Tube BID  . chlorhexidine gluconate (MEDLINE KIT)  15 mL Mouth Rinse BID  . Chlorhexidine Gluconate Cloth  6 each Topical Daily  . clonazepam  0.5 mg Per Tube TID  . doxepin  10 mg Per Tube QHS  . enoxaparin (LOVENOX) injection  40 mg Subcutaneous Q24H  . famotidine  20 mg Per Tube BID  . free water  200 mL Per Tube Q4H  . insulin aspart  0-5 Units Subcutaneous QHS  . insulin aspart  0-9 Units Subcutaneous TID WC  . insulin glargine  18 Units Subcutaneous QHS  . ipratropium-albuterol  3 mL Nebulization Q6H  . mouth rinse  15 mL Mouth Rinse QID  . metoprolol tartrate  6.25 mg Per Tube BID  . montelukast  10 mg Per Tube QHS  . multivitamin  15 mL Per Tube Daily  . PARoxetine  30 mg Per Tube Daily  . polyethylene glycol  17 g Per Tube Daily  . QUEtiapine  50 mg Per Tube BID  . sodium chloride flush  10-40 mL Intracatheter Q12H     LOS: 16 days   Cherene Altes, MD [facility] Office  [phone] Pager - Text Page per Amion as per below:  On-Call/Text Page:      [email or web address]      password [email or web address]  If 7PM-7AM, please contact night-coverage [email or web address] Password [facility] 09/07/2017, 5:31 PM

## 2017-09-08 LAB — GLUCOSE, CAPILLARY
GLUCOSE-CAPILLARY: 132 mg/dL — AB (ref 65–99)
Glucose-Capillary: 104 mg/dL — ABNORMAL HIGH (ref 65–99)
Glucose-Capillary: 120 mg/dL — ABNORMAL HIGH (ref 65–99)
Glucose-Capillary: 124 mg/dL — ABNORMAL HIGH (ref 65–99)
Glucose-Capillary: 127 mg/dL — ABNORMAL HIGH (ref 65–99)
Glucose-Capillary: 130 mg/dL — ABNORMAL HIGH (ref 65–99)
Glucose-Capillary: 96 mg/dL (ref 65–99)

## 2017-09-08 LAB — BASIC METABOLIC PANEL
Anion gap: 11 (ref 5–15)
BUN: 7 mg/dL (ref 6–20)
CALCIUM: 9.1 mg/dL (ref 8.9–10.3)
CO2: 34 mmol/L — AB (ref 22–32)
Chloride: 101 mmol/L (ref 101–111)
Creatinine, Ser: 0.52 mg/dL (ref 0.44–1.00)
GFR calc Af Amer: >60 mL/min (ref >60)
GLUCOSE: 116 mg/dL — AB (ref 65–99)
POTASSIUM: 3.8 mmol/L (ref 3.5–5.1)
Sodium: 146 mmol/L — ABNORMAL HIGH (ref 135–145)

## 2017-09-08 LAB — CBC
HCT: 28.2 % — ABNORMAL LOW (ref 36.0–46.0)
HEMOGLOBIN: 7.7 g/dL — AB (ref 12.0–15.0)
MCH: 23.7 pg — AB (ref 26.0–34.0)
MCHC: 27.3 g/dL — AB (ref 30.0–36.0)
MCV: 86.8 fL (ref 78.0–100.0)
Platelets: 124 10*3/uL — ABNORMAL LOW (ref 150–400)
RBC: 3.25 MIL/uL — ABNORMAL LOW (ref 3.87–5.11)
RDW: 19.7 % — ABNORMAL HIGH (ref 11.5–15.5)
WBC: 6.2 10*3/uL (ref 4.0–10.5)

## 2017-09-08 LAB — VANCOMYCIN, TROUGH: VANCOMYCIN TR: 15 ug/mL (ref 15–20)

## 2017-09-08 LAB — MAGNESIUM: Magnesium: 2 mg/dL (ref 1.7–2.4)

## 2017-09-08 NOTE — Progress Notes (Signed)
   I have seen and examined the patient with nurse practitioner/resident and agree with the note above.  We formulated the plan together and I elicited the following history.    Subjective: No events overnight, on full vent support Failed TC, back on vent  Objective: Vitals:   09/08/17 1136 09/08/17 1140 09/08/17 1200 09/08/17 1300  BP:   99/77 (!) 92/59  Pulse:   99 93  Resp:   18 10  Temp:  98.1 F (36.7 C)    TempSrc:  Oral    SpO2: 97%  96% 98%  Weight:      Height:       Vent Mode: PRVC FiO2 (%):  [40 %] 40 % Set Rate:  [12 bmp] 12 bmp Vt Set:  [380 mL] 380 mL PEEP:  [5 cmH20] 5 cmH20 Plateau Pressure:  [15 cmH20-30 cmH20] 15 cmH20  Intake/Output Summary (Last 24 hours) at 09/08/2017 1352 Last data filed at 09/08/2017 1300 Gross per 24 hour  Intake 1100.25 ml  Output 1400 ml  Net -299.75 ml   General: Chronically ill appearing female, resting in exam bed on vent, NAD HENT: Trach site clean, [place]/AT, PERRL, EOM-I and MMM Pulm: Coarse BS diffusely Card: RRR, Nl S1/S2 and -M/R/G Abd: Peg in place, soft, NT, ND and +BS Gu: clear yellow  Neuro: Moving all ext to command  CBC    Component Value Date/Time   WBC 6.2 09/08/2017 0321   RBC 3.25 (L) 09/08/2017 0321   HGB 7.7 (L) 09/08/2017 0321   HCT 28.2 (L) 09/08/2017 0321   PLT 124 (L) 09/08/2017 0321   MCV 86.8 09/08/2017 0321   MCH 23.7 (L) 09/08/2017 0321   MCHC 27.3 (L) 09/08/2017 0321   RDW 19.7 (H) 09/08/2017 0321   LYMPHSABS 1.4 09/01/2017 0343   MONOABS 0.9 09/01/2017 0343   EOSABS 0.4 09/01/2017 0343   BASOSABS 0.0 09/01/2017 0343    BMET    Component Value Date/Time   NA 146 (H) 09/08/2017 0321   K 3.8 09/08/2017 0321   CL 101 09/08/2017 0321   CO2 34 (H) 09/08/2017 0321   GLUCOSE 116 (H) 09/08/2017 0321   BUN 7 09/08/2017 0321   CREATININE 0.52 09/08/2017 0321   CALCIUM 9.1 09/08/2017 0321   GFRNONAA >60 09/08/2017 0321   GFRAA >60 09/08/2017 0321    Impression/Plan:  Severe chronic  respiratory failure with 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 Had been on prolastin replacement weekly thru [facility].  Ventilator dependence Trach dependence  Coag neg SA bacteremia d/t indwelling IV access site. Now removed.   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  PCCM was consulted for vent management and management of above.  I reviewed CXR myself, trach is in good position.  Discussed with PCCM-NP.  Awaiting placement.  Chronic respiratory failure:  - Attempt PS trials again today, full vent support at night.  Hypoxemia:   - Titrate O2 for sat of 88-92%.  COPD:  - Bronchodilators as ordered  - Continue inhaled steroids  Alpha-1-anti-trypsin deficiency:   - Will need to continue medications even though respiratory failure has already set in.  Will prevent further progression.  Trach status:  - Maintain current trach type and size  PCCM will continue to follow for vent/trach/a1at.  Likely weekly at this point.  Alyson Reedy, M.D. [facility] Pulmonary/Critical Care Medicine. Pager: [phone]. After hours pager: [phone].

## 2017-09-08 NOTE — Progress Notes (Signed)
CSW continuing to work on patient placement.  [facility] has confirmed today that they can accept patient back medically if doesn't have any additional care needs but they won't have female bed till next week  CSW spoke with CCM MD regarding Prolastin as a barrier to DC to alternative facilities- CSW to discuss with pt dtr that prolastin could be stopped and this would allow for alternative placement but that this would mean illness progression could be faster  CSW to assist with disposition pending pt and dtr decision regarding prolastin  Burna Sis, LCSW Clinical Social Worker [phone]

## 2017-09-08 NOTE — Progress Notes (Signed)
PT Cancellation Note  Patient Details Name: Cattie Tineo MRN: [id number] DOB: 1971-01-13   Cancelled Treatment:    Reason Eval/Treat Not Completed: (P) Patient declined, no reason specified  Pt reports she is nauseous but will work with therapy tomorrow.  Quincie Haroon B. Beverely Risen PT, DPT Acute Rehabilitation  [phone] Pager [phone]    Elon Alas Fleet 09/08/2017, 11:31 AM

## 2017-09-08 NOTE — Progress Notes (Signed)
Pharmacy Antibiotic Note  Danielle Stevens is a 47 y.o. female admitted on 08/22/2017 with MRSE bacteremia to s/p removal of port-a-cath. Remains on vancomycin, planning to continue until 5/15. Renal function remains stable. Pt is afebrile and WBC is WNL.   Vancomycin trough therapeutic at 15  Plan: Continue vancomycin 750mg  IV Q12H Therapy ends 5/15   Height: 5\' 4"  (162.6 cm) Weight: 176 lb 2.4 oz (79.9 kg) IBW/kg (Calculated) : 54.7  Temp (24hrs), Avg:98.5 F (36.9 C), Min:97.5 F (36.4 C), Max:99.4 F (37.4 C)  Recent Labs  Lab 09/02/17 0601  09/04/17 0450 09/05/17 0434 09/06/17 0446 09/07/17 0410 09/08/17 0321 09/08/17 2130  WBC 11.7*   < > 8.5 7.9 8.2 6.7 6.2  --   CREATININE 0.51  --   --   --  0.51 0.48 0.52  --   VANCOTROUGH  --   --   --   --   --   --   --  15   < > = values in this interval not displayed.    Estimated Creatinine Clearance: 88 mL/min (by C-G formula based on SCr of 0.52 mg/dL).    Allergies  Allergen Reactions  . Wasp Venom Protein Shortness Of Breath  . Adhesive [Tape] Other (See Comments)    Bruises   . Coconut Oil Hives  . Latex Other (See Comments)    Bruises   . Robitussin Severe Multi-Symp [Phenylephrine-Dm-Gg-Apap] Diarrhea and Other (See Comments)    Allergic, per verbal MAR  . Shellfish-Derived Products Hives and Other (See Comments)    Allergic, per verbal MAR    Levaquin 4/26 >> 4/30 Vancomycin 4/27 >>  4/26 TA: normal resp flora 4/26 BCx: 2/2 CONS 4/26 BCID: MRSE 4/26 MRSA PCR: pos 5/1 cdif antigen +, tox neg 5/1 blood x 1 > ngF  Thank you Okey Regal, PharmD [phone] 09/08/2017 10:06 PM

## 2017-09-08 NOTE — Progress Notes (Signed)
[place] TEAM 1 - Stepdown/ICU TEAM  Clairissa Valvano  ZOX:[id number] DOB: [birth year] DOA: 08/22/2017 PCP: Patient, No Pcp Per    Brief Narrative:  9 year oldfemale LTACH resident w/ a hx of alpha-1 antitrypsin deficiency and chronic ventilator dependent respiratory failure who presented unresponsive due to acute hypercapnic respiratory failure due to mucous plugging.  By the time she arrived in the ED EMS had actually essentially corrected her acute resp decline.      Significant Events: 4/26 admit 4/30 Port-a-cath removed in OR 5/7 PICC placed   Subjective: The patient suffered with significant nausea last night and an episode of vomiting.  Her nurse held her tube feeds.  This morning her symptoms have resolved.  Her nurses currently slowly advancing her tube feeds and she is thus far tolerating it without difficulty.  She reports no complaints to me at the time of my exam.  She denies shortness of breath nausea vomiting or abdominal pain.     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 Port removed 4/30 - continue IV antibiotic therapy through 09/10/17 as per ID suggestions - has a single lumen PICC as of 5/6  Acute on chronic hypercapnic respiratory failure - COPD + A1 AT deficiency / advanced lung disease - chronic tracheostomy  Vent care per PCCM - plan to return to [facility] for possible long term wean attempts - cont bronchodilator therapy - on once weekly Prolastin for alpha 1 antitrypsin deficiency (will require replacement of port-a-cath after abx tx completed)  DM2 CBG controlled  Episodic vomiting No exam findings to suggest ileus - slowly resume tube feeds and follow   Hypertension Blood pressure borderline low but stable   Hypokalemia  Significantly improved with supplementation  Chronic multifactorial normocytic anemia Transfused 1U PRBC 5/9 - Hgb is stable  Depression Continue clonazepam, quetiapine, buspirone, as  needed lorazepam   DVT prophylaxis: lovenox  Code Status: FULL CODE Family Communication: no family present at time of exam  Disposition Plan: [facility]an (pt does not wish to return to [facility])  Consultants:  PCCM ID Gen Surgery   Antimicrobials:  Levofloxacin 4/26 > 4/30 Vancomycin 4/27 >  Objective: Blood pressure 103/63, pulse (!) 103, temperature 98.1 F (36.7 C), temperature source Oral, resp. rate (!) 30, height '5\' 4"'$  (1.626 m), weight 79.9 kg (176 lb 2.4 oz), SpO2 94 %.  Intake/Output Summary (Last 24 hours) at 09/08/2017 1445 Last data filed at 09/08/2017 1400 Gross per 24 hour  Intake 995.25 ml  Output 1400 ml  Net -404.75 ml   Filed Weights   09/06/17 0500 09/07/17 0417 09/08/17 0331  Weight: 78.2 kg (172 lb 6.4 oz) 79.2 kg (174 lb 9.7 oz) 79.9 kg (176 lb 2.4 oz)    Examination: General: No acute respiratory distress - alert   Lungs: Clear to auscultation B on vent support  Cardiovascular: RRR Abdomen: Nontender, nondistended, soft, BS+, no ascites  Extremities: No edema B LE    CBC: Recent Labs  Lab 09/06/17 0446 09/07/17 0410 09/08/17 0321  WBC 8.2 6.7 6.2  HGB 7.9* 7.4* 7.7*  HCT 28.2* 26.3* 28.2*  MCV 86.2 86.2 86.8  PLT 141* 131* 811*   Basic Metabolic Panel: Recent Labs  Lab 09/02/17 0601 09/06/17 0446 09/07/17 0410 09/08/17 0321  NA 143 141 143 146*  K 3.6 3.2* 3.1* 3.8  CL 98* 98* 100* 101  CO2 37* 36* 37* 34*  GLUCOSE 82 120* 159*  116*  BUN '16 8 8 7  '$ CREATININE 0.51 0.51 0.48 0.52  CALCIUM 8.6* 9.0 8.7* 9.1  MG 2.2  --  2.1 2.0   GFR: Estimated Creatinine Clearance: 88 mL/min (by C-G formula based on SCr of 0.52 mg/dL).  Liver Function Tests: Recent Labs  Lab 09/02/17 0601  AST 19  ALT 22  ALKPHOS 60  BILITOT 0.8  PROT 5.0*  ALBUMIN 2.6*    CBG: Recent Labs  Lab 09/07/17 2018 09/08/17 0038 09/08/17 0400 09/08/17 0751 09/08/17 1154  GLUCAP 116* 130* 124* 120* 127*     Scheduled  Meds: . acidophilus  1 capsule Oral Daily  . arformoterol  15 mcg Nebulization BID  . budesonide (PULMICORT) nebulizer solution  0.5 mg Nebulization BID  . busPIRone  15 mg Per Tube BID  . chlorhexidine gluconate (MEDLINE KIT)  15 mL Mouth Rinse BID  . Chlorhexidine Gluconate Cloth  6 each Topical Daily  . clonazepam  0.5 mg Per Tube TID  . doxepin  10 mg Per Tube QHS  . enoxaparin (LOVENOX) injection  40 mg Subcutaneous Q24H  . famotidine  20 mg Per Tube BID  . free water  200 mL Per Tube Q4H  . insulin aspart  0-5 Units Subcutaneous QHS  . insulin aspart  0-9 Units Subcutaneous TID WC  . insulin glargine  18 Units Subcutaneous QHS  . ipratropium-albuterol  3 mL Nebulization Q6H  . mouth rinse  15 mL Mouth Rinse QID  . metoprolol tartrate  6.25 mg Per Tube BID  . montelukast  10 mg Per Tube QHS  . multivitamin  15 mL Per Tube Daily  . PARoxetine  30 mg Per Tube Daily  . polyethylene glycol  17 g Per Tube Daily  . promethazine  12.5 mg Intravenous Once  . QUEtiapine  50 mg Per Tube BID  . sodium chloride flush  10-40 mL Intracatheter Q12H     LOS: 17 days   Cherene Altes, MD [facility] Office  [phone] Pager - Text Page per Amion as per below:  On-Call/Text Page:      [email or web address]      password [email or web address]  If 7PM-7AM, please contact night-coverage [email or web address] Password [email or web address] 09/08/2017, 2:45 PM

## 2017-09-09 LAB — GLUCOSE, CAPILLARY
GLUCOSE-CAPILLARY: 109 mg/dL — AB (ref 65–99)
GLUCOSE-CAPILLARY: 115 mg/dL — AB (ref 65–99)
Glucose-Capillary: 116 mg/dL — ABNORMAL HIGH (ref 65–99)
Glucose-Capillary: 112 mg/dL — ABNORMAL HIGH (ref 65–99)
Glucose-Capillary: 117 mg/dL — ABNORMAL HIGH (ref 65–99)
Glucose-Capillary: 87 mg/dL (ref 65–99)

## 2017-09-09 MED ORDER — IPRATROPIUM-ALBUTEROL 0.5-2.5 (3) MG/3ML IN SOLN
3.0000 mL | Freq: Three times a day (TID) | RESPIRATORY_TRACT | Status: DC
  Administered 2017-09-09 – 2017-09-16 (×22): 3 mL via RESPIRATORY_TRACT
  Filled 2017-09-09 (×21): qty 3

## 2017-09-09 NOTE — Progress Notes (Signed)
CSW continues to work on patient placement.  Dtr would like patient to continue Prolastin but wants to see if there is a way patient can still go to [place]  CSW discussed possibility of dtr obtaining Prolastin through outpatient pharmacy and bringing to facility so that it could still be billed through her Medicaid- dtr agreeable to this plan  CSW called [facility] [place] to discuss- left message for coordinator  CSW will continue to follow  Burna Sis, LCSW Clinical Social Worker [phone]

## 2017-09-09 NOTE — Progress Notes (Signed)
Physical Therapy Treatment Patient Details Name: Danielle Stevens MRN: [id number] DOB: [birth year] Today's Date: 09/09/2017    History of Present Illness Pt is a 47 y.o. female with alpha-1 antitrypsin deficiency and has ventilator dependent respiratory failure, admitted from [facility] on 08/22/17 due to sudden unresponsiveness. Found to have worsening acidosis in setting of mucous plug from purulent bronchitis; also with MRSE bacteremia and hypotension. PMH includes anxiety, asthma, COPD, DM, emphysema lung, HTN. Of note, recently admitted 05/16/17-06/03/17 for COPD exacerbation requiring intubation; tracheostomy done later with d/c to SNF [facility].     PT Comments    Pt reluctantly agreeable to participate in therapy today. Pt requiring modAx2 for bed mobility and maxAx2 for attempt at sit>stand in [place]. Pt unable to attain fully upright and refused additional attempts. D/c plans remain appropriate at this time. PT will continue to follow acutely.    Follow Up Recommendations  SNF     Equipment Recommendations  None recommended by PT    Recommendations for Other Services       Precautions / Restrictions Precautions Precautions: Fall;Other (comment) Precaution Comments: trach/vent, peg tube, flexiseal, high anxiety Restrictions Weight Bearing Restrictions: No    Mobility  Bed Mobility Overal bed mobility: Needs Assistance Bed Mobility: Rolling Rolling: Mod assist;+2 for physical assistance   Supine to sit: Mod assist;+2 for physical assistance     General bed mobility comments: mod assist x 2 for rolling for pericare and for LEs to EOB and trunk elevation for sitting EoB  Transfers Overall transfer level: Needs assistance Equipment used: 2 person hand held assist Transfers: Sit to/from Stand Sit to Stand: Max assist;+2 physical assistance         General transfer comment: Nervous to get up, required max encouragement; Utilized Bariatric Stedy for sit>stand however  unable to attain full upright to engage flaps, Pt with increased DoE sat back on EoB requiring vc to slow breathing secondary to anxiety. Despite pt with c/o of not being able to breath pt maintianed SaO2 >90% throughout session on vent PEEP 5 FiO2 40%, however max HR of 135 bpm, BP stable throughout session  Ambulation/Gait             General Gait Details: unable         Balance Overall balance assessment: Needs assistance Sitting-balance support: Bilateral upper extremity supported;Feet unsupported Sitting balance-Leahy Scale: Poor Sitting balance - Comments: UE support and max assist to sit EOB due to feet not touching floor   Standing balance support: During functional activity;Bilateral upper extremity supported Standing balance-Leahy Scale: Poor Standing balance comment: Needed assist of UEs and max external support to transfer stand pivot to chair as well as use of pad to hold pt into anterior and upright position.                            Cognition Arousal/Alertness: Awake/alert Behavior During Therapy: WFL for tasks assessed/performed;Anxious Overall Cognitive Status: Difficult to assess                                 General Comments: Pt nodding yes/no and mouthing responses to questions appropriately. Follows commands      Exercises General Exercises - Lower Extremity Ankle Circles/Pumps: AROM;Both;20 reps;Supine Heel Slides: AROM;Both;Supine;10 reps        Pertinent Vitals/Pain Pain Assessment: Faces Faces Pain Scale: Hurts even more Pain Location: generalized Pain  Descriptors / Indicators: Sore Pain Intervention(s): Limited activity within patient's tolerance;Monitored during session;Repositioned           PT Goals (current goals can now be found in the care plan section) Acute Rehab PT Goals Patient Stated Goal: not stated PT Goal Formulation: With patient Time For Goal Achievement: 09/23/17 Potential to Achieve  Goals: Fair Progress towards PT goals: Progressing toward goals    Frequency    Min 2X/week      PT Plan Current plan remains appropriate       AM-PAC PT "6 Clicks" Daily Activity  Outcome Measure  Difficulty turning over in bed (including adjusting bedclothes, sheets and blankets)?: A Little Difficulty moving from lying on back to sitting on the side of the bed? : A Lot Difficulty sitting down on and standing up from a chair with arms (e.g., wheelchair, bedside commode, etc,.)?: A Lot Help needed moving to and from a bed to chair (including a wheelchair)?: A Lot Help needed walking in hospital room?: Total Help needed climbing 3-5 steps with a railing? : Total 6 Click Score: 11    End of Session Equipment Utilized During Treatment: Oxygen;Gait belt(vent with trach) Activity Tolerance: Patient limited by fatigue Patient left: in chair;with call bell/phone within reach;with chair alarm set Nurse Communication: Mobility status;Need for lift equipment PT Visit Diagnosis: Other abnormalities of gait and mobility (R26.89);Muscle weakness (generalized) (M62.81)     Time: 1610-9604 PT Time Calculation (min) (ACUTE ONLY): 28 min  Charges:  $Therapeutic Activity: 23-37 mins                    G Codes:       Ailynn Gow B. Beverely Risen PT, DPT Acute Rehabilitation  [phone] Pager [phone]     Elon Alas Fleet 09/09/2017, 3:56 PM

## 2017-09-09 NOTE — Progress Notes (Signed)
[place] TEAM 1 - Stepdown/ICU TEAM  Candis Kabel  ERD:[id number] DOB: 10-21-1970 DOA: 08/22/2017 PCP: Patient, No Pcp Per    Brief Narrative:  47 year oldfemale LTACH resident w/ a hx of alpha-1 antitrypsin deficiency and chronic ventilator dependent respiratory failure who presented unresponsive due to acute hypercapnic respiratory failure due to mucous plugging.  By the time she arrived in the ED EMS had actually essentially corrected her acute resp decline.      Significant Events: 4/26 admit 4/30 Port-a-cath removed in OR 5/7 PICC placed   Subjective: No acute issues today.  Patient resting comfortably at time of visit.  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 Port removed 4/30 - continue IV antibiotic therapy through 09/10/17 as per ID suggestions - has a single lumen PICC as of 5/6  Acute on chronic hypercapnic respiratory failure - COPD + A1 AT deficiency / advanced lung disease - chronic tracheostomy  [facility] care per PCCM - plan to return to [facility] for possible long term wean attempts - cont bronchodilator therapy - on once weekly Prolastin for alpha 1 antitrypsin deficiency   DM2 CBG controlled  Episodic vomiting No exam findings to suggest ileus - currently tolerating TF without difficulty   Hypertension Blood pressure borderline low but stable   Hypokalemia  Significantly improved with supplementation  Chronic multifactorial normocytic anemia Transfused 1U PRBC 5/9 - Hgb is stable  Depression Continue clonazepam, quetiapine, buspirone, as needed lorazepam   DVT prophylaxis: lovenox  Code Status: FULL CODE Family Communication: no family present at time of exam  Disposition Plan: d/c to [facility] for chronic vent wean when bed available (CSW is following)  Consultants:  PCCM ID Gen Surgery   Antimicrobials:  Levofloxacin 4/26 > 4/30 Vancomycin 4/27 >  Objective: Blood pressure 109/66, pulse (!) 110,  temperature 97.6 F (36.4 C), temperature source Oral, resp. rate (!) 21, height '5\' 4"'$  (1.626 m), weight 79.5 kg (175 lb 4.3 oz), SpO2 97 %.  Intake/Output Summary (Last 24 hours) at 09/09/2017 1116 Last data filed at 09/09/2017 1038 Gross per 24 hour  Intake 1201.75 ml  Output 1350 ml  Net -148.25 ml   Filed Weights   09/07/17 0417 09/08/17 0331 09/09/17 0500  Weight: 79.2 kg (174 lb 9.7 oz) 79.9 kg (176 lb 2.4 oz) 79.5 kg (175 lb 4.3 oz)    Examination: General: No acute respiratory distress - stable on vent  Lungs: Clear to auscultation B Cardiovascular: RRR Abdomen: Nondistended, soft, BS+, no ascites  Extremities: Trace edema B LE    CBC: Recent Labs  Lab 09/06/17 0446 09/07/17 0410 09/08/17 0321  WBC 8.2 6.7 6.2  HGB 7.9* 7.4* 7.7*  HCT 28.2* 26.3* 28.2*  MCV 86.2 86.2 86.8  PLT 141* 131* 563*   Basic Metabolic Panel: Recent Labs  Lab 09/06/17 0446 09/07/17 0410 09/08/17 0321  NA 141 143 146*  K 3.2* 3.1* 3.8  CL 98* 100* 101  CO2 36* 37* 34*  GLUCOSE 120* 159* 116*  BUN '8 8 7  '$ CREATININE 0.51 0.48 0.52  CALCIUM 9.0 8.7* 9.1  MG  --  2.1 2.0   GFR: Estimated Creatinine Clearance: 87.7 mL/min (by C-G formula based on SCr of 0.52 mg/dL).  Liver Function Tests: No results for input(s): AST, ALT, ALKPHOS, BILITOT, PROT, ALBUMIN in the last 168 hours.  CBG: Recent Labs  Lab 09/08/17 1613 09/08/17 1945 09/08/17 2323 09/09/17 0346 09/09/17 0732  GLUCAP 96 104* 132* 116* 112*  Scheduled Meds: . acidophilus  1 capsule Oral Daily  . arformoterol  15 mcg Nebulization BID  . budesonide (PULMICORT) nebulizer solution  0.5 mg Nebulization BID  . busPIRone  15 mg Per Tube BID  . chlorhexidine gluconate (MEDLINE KIT)  15 mL Mouth Rinse BID  . Chlorhexidine Gluconate Cloth  6 each Topical Daily  . clonazepam  0.5 mg Per Tube TID  . doxepin  10 mg Per Tube QHS  . enoxaparin (LOVENOX) injection  40 mg Subcutaneous Q24H  . famotidine  20 mg Per Tube  BID  . free water  200 mL Per Tube Q4H  . insulin aspart  0-5 Units Subcutaneous QHS  . insulin aspart  0-9 Units Subcutaneous TID WC  . insulin glargine  18 Units Subcutaneous QHS  . ipratropium-albuterol  3 mL Nebulization TID  . mouth rinse  15 mL Mouth Rinse QID  . metoprolol tartrate  6.25 mg Per Tube BID  . montelukast  10 mg Per Tube QHS  . multivitamin  15 mL Per Tube Daily  . PARoxetine  30 mg Per Tube Daily  . polyethylene glycol  17 g Per Tube Daily  . promethazine  12.5 mg Intravenous Once  . QUEtiapine  50 mg Per Tube BID  . sodium chloride flush  10-40 mL Intracatheter Q12H     LOS: 18 days   Cherene Altes, MD [facility] Office  [phone] Pager - Text Page per Amion as per below:  On-Call/Text Page:      [email or web address]      password [email or web address]  If 7PM-7AM, please contact night-coverage [email or web address] Password [facility] 09/09/2017, 11:16 AM

## 2017-09-10 ENCOUNTER — Inpatient Hospital Stay (HOSPITAL_COMMUNITY): Payer: Medicare Other

## 2017-09-10 DIAGNOSIS — D696 Thrombocytopenia, unspecified: Secondary | ICD-10-CM

## 2017-09-10 LAB — PREPARE RBC (CROSSMATCH)

## 2017-09-10 LAB — COMPREHENSIVE METABOLIC PANEL
ALBUMIN: 2.7 g/dL — AB (ref 3.5–5.0)
ALT: 18 U/L (ref 14–54)
ANION GAP: 10 (ref 5–15)
AST: 18 U/L (ref 15–41)
Alkaline Phosphatase: 65 U/L (ref 38–126)
BUN: 6 mg/dL (ref 6–20)
CHLORIDE: 100 mmol/L — AB (ref 101–111)
CO2: 32 mmol/L (ref 22–32)
CREATININE: 0.58 mg/dL (ref 0.44–1.00)
Calcium: 8.8 mg/dL — ABNORMAL LOW (ref 8.9–10.3)
GFR calc non Af Amer: >60 mL/min (ref >60)
GLUCOSE: 111 mg/dL — AB (ref 65–99)
Potassium: 3.5 mmol/L (ref 3.5–5.1)
SODIUM: 142 mmol/L (ref 135–145)
Total Bilirubin: 0.5 mg/dL (ref 0.3–1.2)
Total Protein: 5.8 g/dL — ABNORMAL LOW (ref 6.5–8.1)

## 2017-09-10 LAB — GLUCOSE, CAPILLARY
GLUCOSE-CAPILLARY: 106 mg/dL — AB (ref 65–99)
GLUCOSE-CAPILLARY: 148 mg/dL — AB (ref 65–99)
GLUCOSE-CAPILLARY: 104 mg/dL — AB (ref 65–99)
GLUCOSE-CAPILLARY: 437 mg/dL — AB (ref 65–99)
GLUCOSE-CAPILLARY: 99 mg/dL (ref 65–99)
Glucose-Capillary: 133 mg/dL — ABNORMAL HIGH (ref 65–99)

## 2017-09-10 LAB — CBC
HCT: 19.5 % — ABNORMAL LOW (ref 36.0–46.0)
HEMOGLOBIN: 5.4 g/dL — AB (ref 12.0–15.0)
MCH: 24.3 pg — AB (ref 26.0–34.0)
MCHC: 27.7 g/dL — AB (ref 30.0–36.0)
MCV: 87.8 fL (ref 78.0–100.0)
Platelets: 75 10*3/uL — ABNORMAL LOW (ref 150–400)
RBC: 2.22 MIL/uL — ABNORMAL LOW (ref 3.87–5.11)
RDW: 19.1 % — ABNORMAL HIGH (ref 11.5–15.5)
WBC: 4.2 10*3/uL (ref 4.0–10.5)

## 2017-09-10 MED ORDER — IPRATROPIUM-ALBUTEROL 0.5-2.5 (3) MG/3ML IN SOLN
RESPIRATORY_TRACT | Status: AC
  Filled 2017-09-10: qty 3

## 2017-09-10 MED ORDER — FUROSEMIDE 10 MG/ML IJ SOLN
40.0000 mg | Freq: Once | INTRAMUSCULAR | Status: AC
  Administered 2017-09-10: 40 mg via INTRAVENOUS
  Filled 2017-09-10: qty 4

## 2017-09-10 MED ORDER — SODIUM CHLORIDE 0.9 % IV SOLN
Freq: Once | INTRAVENOUS | Status: AC
  Administered 2017-09-10: 17:00:00 via INTRAVENOUS

## 2017-09-10 MED ORDER — SODIUM CHLORIDE 0.9 % IV BOLUS
1000.0000 mL | Freq: Once | INTRAVENOUS | Status: AC
  Administered 2017-09-10: 1000 mL via INTRAVENOUS

## 2017-09-10 MED ORDER — ACETAMINOPHEN 325 MG PO TABS
650.0000 mg | ORAL_TABLET | Freq: Once | ORAL | Status: AC
  Administered 2017-09-10: 650 mg via ORAL
  Filled 2017-09-10: qty 2

## 2017-09-10 MED ORDER — DIPHENHYDRAMINE HCL 50 MG/ML IJ SOLN
25.0000 mg | Freq: Once | INTRAMUSCULAR | Status: AC
  Administered 2017-09-10: 25 mg via INTRAVENOUS
  Filled 2017-09-10: qty 1

## 2017-09-10 MED ORDER — DEXMEDETOMIDINE HCL IN NACL 200 MCG/50ML IV SOLN
0.4000 ug/kg/h | INTRAVENOUS | Status: DC
  Administered 2017-09-10: 0.4 ug/kg/h via INTRAVENOUS
  Administered 2017-09-11: 0.7 ug/kg/h via INTRAVENOUS
  Administered 2017-09-11: 0.5 ug/kg/h via INTRAVENOUS
  Administered 2017-09-11: 0.7 ug/kg/h via INTRAVENOUS
  Administered 2017-09-11: 0.4 ug/kg/h via INTRAVENOUS
  Administered 2017-09-11: 0.5 ug/kg/h via INTRAVENOUS
  Administered 2017-09-12: 0.4 ug/kg/h via INTRAVENOUS
  Filled 2017-09-10 (×9): qty 50

## 2017-09-10 MED ORDER — SODIUM CHLORIDE 0.9 % IV BOLUS
500.0000 mL | Freq: Once | INTRAVENOUS | Status: AC
  Administered 2017-09-10: 500 mL via INTRAVENOUS

## 2017-09-10 NOTE — Progress Notes (Signed)
RT NOTE:      Pt complains of difficulty breathing, SpO2 94%, RR 24, Peak Pressure: 34. Pt very anxious. Albuterol treatment given, lavage/suction x3 with minimal think, yellow secretions. Pt complains she feels no better. RN administered anxiety meds, RT started CPT through bed. Pt taken off Vent, lavaged and manually bagged with peep valve w/ minimal secretions. RN advised to contact MD with update and for further instruction.

## 2017-09-10 NOTE — Progress Notes (Addendum)
CRITICAL VALUE ALERT  Critical Value:  Hgb 5.4  Date & Time Notied:  09/10/2017, 1610  Provider Notified: text page sent to Dr Sharon Seller. Will await further instruction.

## 2017-09-10 NOTE — Progress Notes (Addendum)
PROGRESS NOTE        PATIENT DETAILS Name: Danielle Stevens Age: 47 y.o. Sex: female Date of Birth: 03-13-1971 Admit Date: 08/22/2017 Admitting Physician Sampson Goon, MD YIR:SWNIOEV, No Pcp Per  Brief Narrative: Patient is a 47 y.o. female resident of a LTAC with history of alpha-1 antitrypsin deficiency-chronic ventilator dependence-presented secondary to unresponsiveness, she was found to have acute on chronic hypercarbic respiratory failure due to mucous tugging.  Hospital course complicated by MRSE bacteremia likely secondary to Port-A-Cath infection.  Port-A-Cath was removed by general surgery on 4/30.  See below for further details  Subjective: Lying comfortably in bed-no major events overnight per RN.  Denies any chest pain.  Able to follow commands-even though she is on a ventilator.   Assessment/Plan: MRSE bacteremia secondary to port-A-Cath infection: Brown and without leukocytosis.  Continue IV vancomycin through 5/19 recommended by ID.  Port-A-Cath removed by general surgery on 4/30.  Repeat blood culture on 5/1 negative.  Anemia: No evidence of blood loss at this time-suspect worsening anemia due to acute illness.  Transfused 2 units of PRBC.  Recent anemia panel done on 5/8 consistent with anemia of chronic disease.  Thrombocytopenia: Continues to have worsening thrombocytopenia-etiology uncertain-we will go ahead and send out HIT panel, hold Lovenox.  Could be due to vancomycin as well-follow for now.  If thrombocytopenia worsens-we will ask hematology to evaluate and review peripheral smear.  Acute on chronic hypercarbic respiratory failure: Acute hypercarbic respiratory failure on admission secondary to mucous plugging.  Patient has advanced COPD with alpha-1 antitrypsin deficiency-has a chronic tracheostomy and is ventilator dependent.  PCCM following-defer further to their service.  COPD with alpha-1 antitrypsin deficiency: See above-continue  bronchodilators and weekly Prolastin.  DM-2: CBG stable with 18 units of Lantus and SSI  Intermittent vomiting: No major issues overnight per RN-supportive care.  Follow for now.  Hypertension: BP currently stable-continue low-dose metoprolol  Depression: Appears to be stable-continue Klonopin, Seroquel, BuSpar.  DVT Prophylaxis: SCD's  Code Status: Full code  Family Communication: None at bedside  Disposition Plan: Remain inpatient-DC to vent SNF when bed available.  Antimicrobial agents: Vancomycin 4/27 >stop date 5/19    Anti-infectives (From admission, onward)   Start     Dose/Rate Route Frequency Ordered Stop   08/26/17 2000  levofloxacin (LEVAQUIN) tablet 500 mg     500 mg Per Tube Daily 08/26/17 1053 08/26/17 2040   08/26/17 0845  vancomycin (VANCOCIN) IVPB 750 mg/150 ml premix     750 mg 150 mL/hr over 60 Minutes Intravenous Every 12 hours 08/26/17 0844 09/10/17 2359   08/24/17 0400  vancomycin (VANCOCIN) IVPB 750 mg/150 ml premix  Status:  Discontinued     750 mg 150 mL/hr over 60 Minutes Intravenous Every 8 hours 08/23/17 1925 08/25/17 1324   08/23/17 2000  vancomycin (VANCOCIN) 1,250 mg in sodium chloride 0.9 % 250 mL IVPB     1,250 mg 166.7 mL/hr over 90 Minutes Intravenous  Once 08/23/17 1925 08/23/17 2328   08/22/17 2330  levofloxacin (LEVAQUIN) IVPB 750 mg  Status:  Discontinued     750 mg 100 mL/hr over 90 Minutes Intravenous Daily at bedtime 08/22/17 2240 08/26/17 1053      Procedures: 4/30>> Port-a-cath removed in OR 5/7>> PICC placed   CONSULTS:  pulmonary/intensive care, ID and general surgery  Time spent: 25- minutes-Greater than 50%  of this time was spent in counseling, explanation of diagnosis, planning of further management, and coordination of care.  MEDICATIONS: Scheduled Meds: . acetaminophen  650 mg Oral Once  . acidophilus  1 capsule Oral Daily  . arformoterol  15 mcg Nebulization BID  . budesonide (PULMICORT) nebulizer  solution  0.5 mg Nebulization BID  . busPIRone  15 mg Per Tube BID  . chlorhexidine gluconate (MEDLINE KIT)  15 mL Mouth Rinse BID  . Chlorhexidine Gluconate Cloth  6 each Topical Daily  . clonazepam  0.5 mg Per Tube TID  . diphenhydrAMINE  25 mg Intravenous Once  . doxepin  10 mg Per Tube QHS  . famotidine  20 mg Per Tube BID  . free water  200 mL Per Tube Q4H  . furosemide  40 mg Intravenous Once  . insulin aspart  0-5 Units Subcutaneous QHS  . insulin aspart  0-9 Units Subcutaneous TID WC  . insulin glargine  18 Units Subcutaneous QHS  . ipratropium-albuterol  3 mL Nebulization TID  . mouth rinse  15 mL Mouth Rinse QID  . metoprolol tartrate  6.25 mg Per Tube BID  . montelukast  10 mg Per Tube QHS  . multivitamin  15 mL Per Tube Daily  . PARoxetine  30 mg Per Tube Daily  . polyethylene glycol  17 g Per Tube Daily  . promethazine  12.5 mg Intravenous Once  . QUEtiapine  50 mg Per Tube BID  . sodium chloride flush  10-40 mL Intracatheter Q12H   Continuous Infusions: . sodium chloride    . alpha-1 proteinase inhibitor (PROLASTIN-C) infusion Stopped (09/04/17 1427)  . feeding supplement (VITAL AF 1.2 CAL) 60 mL/hr at 09/10/17 1400  . vancomycin Stopped (09/10/17 1103)   PRN Meds:.albuterol, LORazepam, ondansetron (ZOFRAN) IV, oxyCODONE, sodium chloride flush   PHYSICAL EXAM: Vital signs: Vitals:   09/10/17 1200 09/10/17 1300 09/10/17 1310 09/10/17 1400  BP: (!) 90/54  98/65 (!) 103/55  Pulse: (!) 104 (!) 106 (!) 106 (!) 106  Resp: 18 (!) 23 (!) 24 (!) 23  Temp:      TempSrc:      SpO2: 98% 97% 97% 97%  Weight:      Height:       Filed Weights   09/08/17 0331 09/09/17 0500 09/10/17 0423  Weight: 79.9 kg (176 lb 2.4 oz) 79.5 kg (175 lb 4.3 oz) 78.8 kg (173 lb 11.6 oz)   Body mass index is 29.82 kg/m.   General appearance :Awake, alert, chronically sick appearing but not in any distress.   HEENT: Atraumatic and Normocephalic Neck: supple, no JVD. No cervical  lymphadenopathy. No thyromegaly Resp:Good air entry bilaterally, no rhonchi CVS: S1 S2 regular, no murmurs.  GI: Bowel sounds present, Non tender and not distended with no gaurding, rigidity or rebound.No organomegaly Extremities: B/L Lower Ext shows+ edema, both legs are warm to touch Neurology: Generalized weakness present but nonfocal Musculoskeletal:No digital cyanosis Skin:No Rash, warm and dry Wounds:N/A  I have personally reviewed following labs and imaging studies  LABORATORY DATA: CBC: Recent Labs  Lab 09/05/17 0434 09/06/17 0446 09/07/17 0410 09/08/17 0321 09/10/17 0839  WBC 7.9 8.2 6.7 6.2 4.2  HGB 8.0* 7.9* 7.4* 7.7* 5.4*  HCT 27.9* 28.2* 26.3* 28.2* 19.5*  MCV 85.1 86.2 86.2 86.8 87.8  PLT 145* 141* 131* 124* 75*    Basic Metabolic Panel: Recent Labs  Lab 09/06/17 0446 09/07/17 0410 09/08/17 0321 09/10/17 0515  NA 141 143 146* 142  K  3.2* 3.1* 3.8 3.5  CL 98* 100* 101 100*  CO2 36* 37* 34* 32  GLUCOSE 120* 159* 116* 111*  BUN _0 CREATININE 0.51 0.48 0.52 0.58  CALCIUM 9.0 8.7* 9.1 8.8*  MG  --  2.1 2.0  --     GFR: Estimated Creatinine Clearance: 87.3 mL/min (by C-G formula based on SCr of 0.58 mg/dL).  Liver Function Tests: Recent Labs  Lab 09/10/17 0515  AST 18  ALT 18  ALKPHOS 65  BILITOT 0.5  PROT 5.8*  ALBUMIN 2.7*   No results for input(s): LIPASE, AMYLASE in the last 168 hours. No results for input(s): AMMONIA in the last 168 hours.  Coagulation Profile: No results for input(s): INR, PROTIME in the last 168 hours.  Cardiac Enzymes: No results for input(s): CKTOTAL, CKMB, CKMBINDEX, TROPONINI in the last 168 hours.  BNP (last 3 results) No results for input(s): PROBNP in the last 8760 hours.  HbA1C: No results for input(s): HGBA1C in the last 72 hours.  CBG: Recent Labs  Lab 09/09/17 1930 09/09/17 2208 09/10/17 0331 09/10/17 0720 09/10/17 1143  GLUCAP 109* 115* 106* 133* 148*    Lipid Profile: No results  for input(s): CHOL, HDL, LDLCALC, TRIG, CHOLHDL, LDLDIRECT in the last 72 hours.  Thyroid Function Tests: No results for input(s): TSH, T4TOTAL, FREET4, T3FREE, THYROIDAB in the last 72 hours.  Anemia Panel: No results for input(s): VITAMINB12, FOLATE, FERRITIN, TIBC, IRON, RETICCTPCT in the last 72 hours.  Urine analysis:    Component Value Date/Time   COLORURINE YELLOW 08/22/2017 1754   APPEARANCEUR HAZY (A) 08/22/2017 1754   LABSPEC 1.016 08/22/2017 1754   PHURINE 5.0 08/22/2017 1754   GLUCOSEU NEGATIVE 08/22/2017 1754   HGBUR NEGATIVE 08/22/2017 1754   BILIRUBINUR NEGATIVE 08/22/2017 1754   KETONESUR NEGATIVE 08/22/2017 1754   PROTEINUR NEGATIVE 08/22/2017 1754   NITRITE NEGATIVE 08/22/2017 1754   LEUKOCYTESUR SMALL (A) 08/22/2017 1754    Sepsis Labs: Lactic Acid, Venous    Component Value Date/Time   LATICACIDVEN 1.5 08/23/2017 0009    MICROBIOLOGY: No results found for this or any previous visit (from the past 240 hour(s)).  RADIOLOGY STUDIES/RESULTS: Dg Chest Port 1 View  Result Date: 09/08/2017 CLINICAL DATA:  Tachypnea EXAM: PORTABLE CHEST 1 VIEW COMPARISON:  08/26/2017 FINDINGS: Tracheostomy tube tip is about 6.5 cm superior to the carina. Left upper extremity catheter tip overlies the SVC. Hyperinflation with emphysematous disease. Mild bibasilar scarring. No acute opacity or pleural effusion. IMPRESSION: Hyperinflation with emphysematous disease and bibasilar scarring. No acute opacity. Electronically Signed   By: Donavan Foil M.D.   On: 09/08/2017 01:19   Dg Chest Port 1 View  Result Date: 08/26/2017 CLINICAL DATA:  Respiratory distress, shortness of breath. History of asthma-COPD. Former smoker. EXAM: PORTABLE CHEST 1 VIEW COMPARISON:  Portable chest x-ray of August 22, 2017 FINDINGS: The lungs remain hyperinflated. There is increased density in the right infrahilar region more conspicuous today. The left costophrenic gutter is excluded from the study. The heart  and pulmonary vascularity are normal. The porta catheter tip projects over the midportion of the SVC. IMPRESSION: Right infrahilar atelectasis or pneumonia. Underlying COPD. No CHF. The tracheostomy tube tip projects approximately 1 cm above the clavicular heads. Electronically Signed   By: David  Martinique M.D.   On: 08/26/2017 09:23   Dg Chest Port 1 View  Result Date: 08/22/2017 CLINICAL DATA:  Shortness of breath. EXAM: PORTABLE CHEST 1 VIEW COMPARISON:  08/22/2017 FINDINGS: Tracheostomy with  tip measuring 9.7 cm above the carina. Right central venous catheter with tip over the upper SVC region. Severe emphysematous changes in the lungs, particularly on the left. Scarring in both lungs. No airspace disease or consolidation. No blunting of costophrenic angles. No pneumothorax. Mediastinal contours appear intact. Heart size and pulmonary vascularity are normal. IMPRESSION: Severe emphysematous changes in the lungs with scattered fibrosis. No active infiltration. Electronically Signed   By: Lucienne Capers M.D.   On: 08/22/2017 22:07   Dg Chest Portable 1 View  Result Date: 08/22/2017 CLINICAL DATA:  Shortness of breath.  History of tracheostomy. EXAM: PORTABLE CHEST 1 VIEW COMPARISON:  None. FINDINGS: The heart size and mediastinal contours are within normal limits. Tracheostomy tube present which appears in appropriate position. Right-sided port also present with the catheter tip located in the upper SVC. Lungs show severe emphysematous disease with large bulla formation in the left mid to lower lung. Both lungs are mild to moderately hyperinflated. There is no evidence of pulmonary edema, consolidation, pneumothorax, nodule or pleural fluid. The visualized skeletal structures are unremarkable. IMPRESSION: Severe emphysematous lung disease with bilateral hyperinflation. Electronically Signed   By: Aletta Edouard M.D.   On: 08/22/2017 16:24   [place] Ekg Site Rite  Result Date: 09/01/2017 If Site Rite image  not attached, placement could not be confirmed due to current cardiac rhythm.    LOS: 19 days   Oren Binet, MD  [facility] Pager:[phone]  If 7PM-7AM, please contact night-coverage [email or web address] Password [facility] 09/10/2017, 2:32 PM

## 2017-09-10 NOTE — Progress Notes (Signed)
PCCM Progress Note  Subjective: Unable to tolerate wean, on full vent support due to increase WOB  Objective: Vitals:   09/10/17 1200 09/10/17 1300 09/10/17 1310 09/10/17 1400  BP: (!) 90/54  98/65 (!) 103/55  Pulse: (!) 104 (!) 106 (!) 106 (!) 106  Resp: 18 (!) 23 (!) 24 (!) 23  Temp:      TempSrc:      SpO2: 98% 97% 97% 97%  Weight:      Height:       Vent Mode: PRVC FiO2 (%):  [40 %] 40 % Set Rate:  [12 bmp] 12 bmp Vt Set:  [380 mL] 380 mL PEEP:  [5 cmH20] 5 cmH20 Pressure Support:  [12 cmH20] 12 cmH20 Plateau Pressure:  [18 cmH20-20 cmH20] 20 cmH20  Intake/Output Summary (Last 24 hours) at 09/10/2017 1437 Last data filed at 09/10/2017 1400 Gross per 24 hour  Intake 5037 ml  Output 3050 ml  Net 1987 ml   General: Chronically ill appearing female, in exam bed, on full vent support, NAD HENT: Trach site clean, [place]/AT, PERRL, EOM-I and MMM Pulm: Coarse BS diffusely Card: RRR, Nl S1/S2 and -M/R/G Abd: Peg in place, soft, NT, ND and +BS Neuro: moving all ext to command Skin: ecchymosis, intact  CBC    Component Value Date/Time   WBC 4.2 09/10/2017 0839   RBC 2.22 (L) 09/10/2017 0839   HGB 5.4 (LL) 09/10/2017 0839   HCT 19.5 (L) 09/10/2017 0839   PLT 75 (L) 09/10/2017 0839   MCV 87.8 09/10/2017 0839   MCH 24.3 (L) 09/10/2017 0839   MCHC 27.7 (L) 09/10/2017 0839   RDW 19.1 (H) 09/10/2017 0839   LYMPHSABS 1.4 09/01/2017 0343   MONOABS 0.9 09/01/2017 0343   EOSABS 0.4 09/01/2017 0343   BASOSABS 0.0 09/01/2017 0343   BMET    Component Value Date/Time   NA 142 09/10/2017 0515   K 3.5 09/10/2017 0515   CL 100 (L) 09/10/2017 0515   CO2 32 09/10/2017 0515   GLUCOSE 111 (H) 09/10/2017 0515   BUN 6 09/10/2017 0515   CREATININE 0.58 09/10/2017 0515   CALCIUM 8.8 (L) 09/10/2017 0515   GFRNONAA >60 09/10/2017 0515   GFRAA >60 09/10/2017 0515   Impression/Plan:  Severe chronic respiratory failure with 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 Had been on  prolastin replacement weekly thru [facility].  Ventilator dependence Trach dependence  Coag neg SA bacteremia d/t indwelling IV access site. Now removed.   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  PCCM was consulted for vent management, trach management and alph-1-antitrypsin def.  I reviewed CXR myself, trach is in good position.  Discussed with PCCM-NP.  Awaiting placement.  Chronic respiratory failure:  - Continue weaning attempts for now, doubt will full liberate from the ventilator however.  Hypoxemia:   - Titrate O2 for sat of 88-92%  COPD:  - Bronchodilators as ordered  - Continue ICS  Alpha-1-anti-trypsin deficiency:   - Will need to continue medications even though respiratory failure has already set in.  Will prevent further progression.  Trach status:  - Maintain current trach size and type  PCCM will continue to follow for vent/trach/a1at.  Likely weekly at this point.  Alyson Reedy, M.D. [facility] Pulmonary/Critical Care Medicine. Pager: [phone]. After hours pager: [phone].

## 2017-09-10 NOTE — Progress Notes (Signed)
eLink Physician-Brief Progress Note Patient Name: Danielle Stevens DOB: 08/02/1970 MRN: [id number]   Date of Service  09/10/2017  HPI/Events of Note  Rn notes pt in resp distress, possible panic attack. Sat 98% on 40% FiO2. Did not respond to ativan x2, suctioning.   eICU Interventions  Check stat portable CXR. Start precedex.         Shane Crutch 09/10/2017, 11:22 PM

## 2017-09-10 NOTE — Progress Notes (Signed)
Nutrition Follow-up  INTERVENTION:   Tube feeding via PEG: - Continue Vital AF 1.2 @ 60 ml/hr - Free water flushes per MD (currently 200 ml every 4 hrs)  Tube feeding regimen provides 1728 kcal, 108 grams of protein, and 1166 ml of H2O (meets 100% of estimated kcal and protein needs).  NUTRITION DIAGNOSIS:   Inadequate oral intake related to inability to eat(Chronic resp failure, PEG dependant) as evidenced by NPO status.  Ongoing, being addressed via TF  GOAL:   Patient will meet greater than or equal to 90% of their needs  Met via TF  MONITOR:   Vent status, TF tolerance, Weight trends, Labs  REASON FOR ASSESSMENT:   Consult Enteral/tube feeding initiation and management  ASSESSMENT:   47 year old who suffers from alpha-1 antitrypsin deficiency and has ventilator dependent respiratory failure.  At [facility] earlier today she was suddenly unresponsive and EMS was called.  5/12 - significant nausea with episode of vomiting, TF held 5/13 - TF restarted  Patient is currently on ventilator support via trach. Daily PS trials but no TC.  Pt currently awaiting placement at this time.  Pt asleep at time of RD visit. Per discussion with RN, pt is tolerating TF well. Pt had one episode of emesis but appears to have been unrelated to TF as she is now tolerating well.  Medications reviewed and include: 20 mg Pepcid BID, sliding scale Novolog, 18 units Lantus, Miralax, liquid MVI, IV antibiotics  Labs reviewed: hemoglobin 5.4 (L), HCT 19.5 (L) CBG's: 133, 106, 115, 109, 87 x 24 hours  UOP: 2150 ml x 24 hours I/O's: +7.7 L since 08/27/17 Pt's weight is up 14 lbs since admission. Suspect related to positive fluid balance.  Diet Order:   Diet Order    None      EDUCATION NEEDS:   No education needs have been identified at this time  Skin:  Skin Assessment: Skin Integrity Issues: Skin Integrity Issues:: Other (Comment) Other: MASD: buttock, groin  Last BM:   09/09/17 large type 7 via rectal tube  Height:   Ht Readings from Last 1 Encounters:  08/22/17 '5\' 4"'$  (1.626 m)    Weight:   Wt Readings from Last 1 Encounters:  09/10/17 173 lb 11.6 oz (78.8 kg)    Ideal Body Weight:  54.55 kg  BMI:  Body mass index is 29.82 kg/m.  Estimated Nutritional Needs:   Kcal:  1650-1800 kcals (23-25 kcal/kg bw)  Protein:  87-100 grams (1.2-1.4 grams/kg)  Fluid:  >1.8 L fluid    Gaynell Face, MS, RD, LDN Pager: [phone] Weekend/After Hours: [phone]

## 2017-09-11 DIAGNOSIS — J810 Acute pulmonary edema: Secondary | ICD-10-CM

## 2017-09-11 DIAGNOSIS — R0682 Tachypnea, not elsewhere classified: Secondary | ICD-10-CM

## 2017-09-11 LAB — COMPREHENSIVE METABOLIC PANEL
ALK PHOS: 61 U/L (ref 38–126)
ALT: 18 U/L (ref 14–54)
ANION GAP: 7 (ref 5–15)
AST: 20 U/L (ref 15–41)
Albumin: 2.7 g/dL — ABNORMAL LOW (ref 3.5–5.0)
BILIRUBIN TOTAL: 0.6 mg/dL (ref 0.3–1.2)
BUN: 8 mg/dL (ref 6–20)
CALCIUM: 8.8 mg/dL — AB (ref 8.9–10.3)
CO2: 37 mmol/L — AB (ref 22–32)
Chloride: 98 mmol/L — ABNORMAL LOW (ref 101–111)
Creatinine, Ser: 0.52 mg/dL (ref 0.44–1.00)
GFR calc non Af Amer: >60 mL/min (ref >60)
Glucose, Bld: 142 mg/dL — ABNORMAL HIGH (ref 65–99)
POTASSIUM: 3.1 mmol/L — AB (ref 3.5–5.1)
SODIUM: 142 mmol/L (ref 135–145)
TOTAL PROTEIN: 5.7 g/dL — AB (ref 6.5–8.1)

## 2017-09-11 LAB — CBC
HCT: 32.6 % — ABNORMAL LOW (ref 36.0–46.0)
Hemoglobin: 9.5 g/dL — ABNORMAL LOW (ref 12.0–15.0)
MCH: 24.9 pg — ABNORMAL LOW (ref 26.0–34.0)
MCHC: 29.1 g/dL — AB (ref 30.0–36.0)
MCV: 85.3 fL (ref 78.0–100.0)
PLATELETS: 136 10*3/uL — AB (ref 150–400)
RBC: 3.82 MIL/uL — ABNORMAL LOW (ref 3.87–5.11)
RDW: 18.3 % — AB (ref 11.5–15.5)
WBC: 6.3 10*3/uL (ref 4.0–10.5)

## 2017-09-11 LAB — BASIC METABOLIC PANEL
Anion gap: 8 (ref 5–15)
BUN: 9 mg/dL (ref 6–20)
CALCIUM: 9.1 mg/dL (ref 8.9–10.3)
CO2: 35 mmol/L — ABNORMAL HIGH (ref 22–32)
Chloride: 98 mmol/L — ABNORMAL LOW (ref 101–111)
Creatinine, Ser: 0.48 mg/dL (ref 0.44–1.00)
Glucose, Bld: 130 mg/dL — ABNORMAL HIGH (ref 65–99)
Potassium: 4 mmol/L (ref 3.5–5.1)
SODIUM: 141 mmol/L (ref 135–145)

## 2017-09-11 LAB — TYPE AND SCREEN
ABO/RH(D): A POS
ANTIBODY SCREEN: NEGATIVE
UNIT DIVISION: 0
Unit division: 0

## 2017-09-11 LAB — GLUCOSE, CAPILLARY
GLUCOSE-CAPILLARY: 137 mg/dL — AB (ref 65–99)
Glucose-Capillary: 119 mg/dL — ABNORMAL HIGH (ref 65–99)
Glucose-Capillary: 119 mg/dL — ABNORMAL HIGH (ref 65–99)
Glucose-Capillary: 117 mg/dL — ABNORMAL HIGH (ref 65–99)

## 2017-09-11 LAB — BPAM RBC
Blood Product Expiration Date: 201905282359
Blood Product Expiration Date: 201906032359
ISSUE DATE / TIME: 201905151630
ISSUE DATE / TIME: 201905152042
UNIT TYPE AND RH: 6200
Unit Type and Rh: 6200

## 2017-09-11 LAB — MAGNESIUM: MAGNESIUM: 1.9 mg/dL (ref 1.7–2.4)

## 2017-09-11 LAB — HEPARIN INDUCED PLATELET AB (HIT ANTIBODY): Heparin Induced Plt Ab: 0.098 OD (ref 0.000–0.400)

## 2017-09-11 MED ORDER — POTASSIUM CHLORIDE 10 MEQ/100ML IV SOLN
10.0000 meq | INTRAVENOUS | Status: AC
  Administered 2017-09-11 (×4): 10 meq via INTRAVENOUS
  Filled 2017-09-11 (×4): qty 100

## 2017-09-11 MED ORDER — ONDANSETRON HCL 4 MG/2ML IJ SOLN
4.0000 mg | Freq: Three times a day (TID) | INTRAMUSCULAR | Status: DC | PRN
  Administered 2017-09-12: 4 mg via INTRAVENOUS
  Filled 2017-09-11: qty 2

## 2017-09-11 MED ORDER — EMPTY CONTAINERS FLEXIBLE MISC
4964.0000 mg | Status: DC
  Administered 2017-09-12 – 2017-09-19 (×2): 4964 mg via INTRAVENOUS
  Filled 2017-09-11: qty 1
  Filled 2017-09-11: qty 4964

## 2017-09-11 MED ORDER — FUROSEMIDE 10 MG/ML IJ SOLN
60.0000 mg | Freq: Once | INTRAMUSCULAR | Status: AC
  Administered 2017-09-11: 60 mg via INTRAVENOUS
  Filled 2017-09-11: qty 6

## 2017-09-11 MED ORDER — ALBUMIN HUMAN 25 % IV SOLN
50.0000 g | Freq: Once | INTRAVENOUS | Status: AC
  Administered 2017-09-11: 50 g via INTRAVENOUS
  Filled 2017-09-11: qty 50
  Filled 2017-09-11: qty 200

## 2017-09-11 MED ORDER — FUROSEMIDE 10 MG/ML IJ SOLN
INTRAMUSCULAR | Status: AC
  Filled 2017-09-11: qty 4

## 2017-09-11 MED ORDER — MIDODRINE HCL 5 MG PO TABS: 5.0000 mg | ORAL_TABLET | Freq: Three times a day (TID) | ORAL | Status: DC

## 2017-09-11 MED ORDER — MIDODRINE HCL 5 MG PO TABS
5.0000 mg | ORAL_TABLET | Freq: Three times a day (TID) | ORAL | Status: DC
  Administered 2017-09-11 – 2017-09-25 (×43): 5 mg
  Filled 2017-09-11 (×42): qty 1

## 2017-09-11 NOTE — Progress Notes (Signed)
Patient seen for trach team follow up.  All needed equipment at the bedside.  No education needed at this time.  Will continue to follow for progression.  

## 2017-09-11 NOTE — Plan of Care (Signed)
  Problem: Coping: Goal: Level of anxiety will decrease Outcome: Progressing Note:  Patient still anxious, but improved on precedex and PRN ativan.   Problem: Safety: Goal: Ability to remain free from injury will improve Outcome: Progressing   Problem: Skin Integrity: Goal: Risk for impaired skin integrity will decrease Outcome: Progressing Note:  Q2 hr turns, patient refuses mobility at times and refusing to keep heels elevated, patient removes pillow from under legs. Sacral foam in place and clean/dry/intact.    Problem: Respiratory: Goal: Ability to maintain a clear airway and adequate ventilation will improve Outcome: Progressing Note:  Pt states shortness of breath is improved at this time.    Problem: Elimination: Goal: Will not experience complications related to bowel motility Outcome: Not Progressing Note:  Flexiseal in place   Problem: Elimination: Goal: Will not experience complications related to urinary retention Outcome: Completed/Met Note:  Purewick in place   Problem: Pain Managment: Goal: General experience of comfort will improve Outcome: Completed/Met Note:  Denies pain

## 2017-09-11 NOTE — Progress Notes (Signed)
Physical Therapy Treatment Patient Details Name: Danielle Stevens MRN: [id number] DOB: 1970-09-21 Today's Date: 09/11/2017    History of Present Illness Pt is a 47 y.o. female with alpha-1 antitrypsin deficiency and has ventilator dependent respiratory failure, admitted from [facility] on 08/22/17 due to sudden unresponsiveness. Found to have worsening acidosis in setting of mucous plug from purulent bronchitis; also with MRSE bacteremia and hypotension. PMH includes anxiety, asthma, COPD, DM, emphysema lung, HTN. Of note, recently admitted 05/16/17-06/03/17 for COPD exacerbation requiring intubation; tracheostomy done later with d/c to SNF [facility].     PT Comments    With initial visit pt anxious with HR in 130's, spoke with pt and she agreed to bed level exercises after her medication. PT came back in 45 minutes and HR 100 bpm, however pt very lethargic, but wanted to participate in therapy. Pt required AAROM exercise given her lethargy. PT will continue to follow for mobility training during her acute stay.     Follow Up Recommendations  SNF     Equipment Recommendations  None recommended by PT    Recommendations for Other Services       Precautions / Restrictions Precautions Precautions: Fall;Other (comment) Precaution Comments: trach/vent, peg tube, flexiseal, high anxiety Restrictions Weight Bearing Restrictions: No          Cognition Arousal/Alertness: Lethargic;Suspect due to medications Behavior During Therapy: [facility] for tasks assessed/performed;Anxious Overall Cognitive Status: Difficult to assess                                 General Comments: Pt nodding yes/no and mouthing responses to questions appropriately. Follows commands      Exercises General Exercises - Upper Extremity Elbow Flexion: AAROM;Both;5 reps;Supine Elbow Extension: AAROM;Both;10 reps;Supine Wrist Flexion: AAROM;Both;5 reps;Supine Wrist Extension: AAROM;Both;5 reps;Supine Digit  Composite Flexion: AROM;Both;5 reps;Supine General Exercises - Lower Extremity Ankle Circles/Pumps: Both;Supine;10 reps;AAROM Heel Slides: Both;Supine;5 reps;AAROM Hip ABduction/ADduction: AAROM;Both;5 reps;Supine Hip Flexion/Marching: AAROM;Both;5 reps;Supine        Pertinent Vitals/Pain Pain Assessment: Faces Faces Pain Scale: Hurts little more Pain Location: generalized Pain Descriptors / Indicators: Sore Pain Intervention(s): Limited activity within patient's tolerance;Monitored during session           PT Goals (current goals can now be found in the care plan section) Acute Rehab PT Goals Patient Stated Goal: not stated PT Goal Formulation: With patient Time For Goal Achievement: 09/23/17 Potential to Achieve Goals: Fair Progress towards PT goals: Not progressing toward goals - comment(pt limited by anxiety increasing HR, after medication lethar)    Frequency    Min 2X/week      PT Plan Current plan remains appropriate       AM-PAC PT "6 Clicks" Daily Activity  Outcome Measure  Difficulty turning over in bed (including adjusting bedclothes, sheets and blankets)?: A Little Difficulty moving from lying on back to sitting on the side of the bed? : A Lot Difficulty sitting down on and standing up from a chair with arms (e.g., wheelchair, bedside commode, etc,.)?: A Lot Help needed moving to and from a bed to chair (including a wheelchair)?: A Lot Help needed walking in hospital room?: Total Help needed climbing 3-5 steps with a railing? : Total 6 Click Score: 11    End of Session Equipment Utilized During Treatment: Oxygen(vent with trach) Activity Tolerance: Patient limited by fatigue Patient left: in chair;with call bell/phone within reach;with chair alarm set Nurse Communication: Mobility status;Need for  lift equipment PT Visit Diagnosis: Other abnormalities of gait and mobility (R26.89);Muscle weakness (generalized) (M62.81)     Time: 2706-2376 PT Time  Calculation (min) (ACUTE ONLY): 10 min  Charges:  $Therapeutic Exercise: 8-22 mins                    G Codes:       Danielle Stevens B. Beverely Risen PT, DPT Acute Rehabilitation  [phone] Pager [phone]     Danielle Stevens Danielle Stevens 09/11/2017, 4:09 PM

## 2017-09-12 LAB — BASIC METABOLIC PANEL
Anion gap: 9 (ref 5–15)
BUN: 11 mg/dL (ref 6–20)
CO2: 40 mmol/L — ABNORMAL HIGH (ref 22–32)
Calcium: 9.5 mg/dL (ref 8.9–10.3)
Chloride: 96 mmol/L — ABNORMAL LOW (ref 101–111)
Creatinine, Ser: 0.5 mg/dL (ref 0.44–1.00)
GFR calc Af Amer: >60 mL/min (ref >60)
GFR calc non Af Amer: >60 mL/min (ref >60)
Glucose, Bld: 108 mg/dL — ABNORMAL HIGH (ref 65–99)
Potassium: 3.5 mmol/L (ref 3.5–5.1)
Sodium: 145 mmol/L (ref 135–145)

## 2017-09-12 LAB — HEMOGLOBIN A1C
Hgb A1c MFr Bld: 5.1 % (ref 4.8–5.6)
MEAN PLASMA GLUCOSE: 99.67 mg/dL

## 2017-09-12 LAB — GLUCOSE, CAPILLARY
GLUCOSE-CAPILLARY: 101 mg/dL — AB (ref 65–99)
GLUCOSE-CAPILLARY: 105 mg/dL — AB (ref 65–99)
Glucose-Capillary: 113 mg/dL — ABNORMAL HIGH (ref 65–99)
Glucose-Capillary: 100 mg/dL — ABNORMAL HIGH (ref 65–99)

## 2017-09-12 LAB — LIPID PANEL
CHOLESTEROL: 160 mg/dL (ref 0–200)
HDL: 51 mg/dL (ref >40)
LDL Cholesterol: 97 mg/dL (ref 0–99)
Total CHOL/HDL Ratio: 3.1 RATIO
Triglycerides: 62 mg/dL (ref <150)
VLDL: 12 mg/dL (ref 0–40)

## 2017-09-12 LAB — CBC
HCT: 32.7 % — ABNORMAL LOW (ref 36.0–46.0)
Hemoglobin: 9.4 g/dL — ABNORMAL LOW (ref 12.0–15.0)
MCH: 24.9 pg — ABNORMAL LOW (ref 26.0–34.0)
MCHC: 28.7 g/dL — ABNORMAL LOW (ref 30.0–36.0)
MCV: 86.5 fL (ref 78.0–100.0)
Platelets: 118 10*3/uL — ABNORMAL LOW (ref 150–400)
RBC: 3.78 MIL/uL — ABNORMAL LOW (ref 3.87–5.11)
RDW: 18.1 % — ABNORMAL HIGH (ref 11.5–15.5)
WBC: 4.9 10*3/uL (ref 4.0–10.5)

## 2017-09-12 MED ORDER — DEXMEDETOMIDINE HCL IN NACL 200 MCG/50ML IV SOLN
0.4000 ug/kg/h | INTRAVENOUS | Status: DC
  Administered 2017-09-12: 0.4 ug/kg/h via INTRAVENOUS
  Administered 2017-09-12: 0.2 ug/kg/h via INTRAVENOUS
  Administered 2017-09-13: 0.4 ug/kg/h via INTRAVENOUS
  Administered 2017-09-13: 0.5 ug/kg/h via INTRAVENOUS
  Administered 2017-09-13: 0.4 ug/kg/h via INTRAVENOUS
  Administered 2017-09-14: 1 ug/kg/h via INTRAVENOUS
  Administered 2017-09-14: 0.8 ug/kg/h via INTRAVENOUS
  Administered 2017-09-14: 0.5 ug/kg/h via INTRAVENOUS
  Administered 2017-09-14: 1 ug/kg/h via INTRAVENOUS
  Administered 2017-09-14: 0.4 ug/kg/h via INTRAVENOUS
  Administered 2017-09-14 – 2017-09-15 (×2): 1 ug/kg/h via INTRAVENOUS
  Administered 2017-09-15 (×9): 1.2 ug/kg/h via INTRAVENOUS
  Administered 2017-09-15: 1 ug/kg/h via INTRAVENOUS
  Administered 2017-09-15 (×2): 1.2 ug/kg/h via INTRAVENOUS
  Filled 2017-09-12 (×24): qty 50

## 2017-09-12 MED ORDER — ENOXAPARIN SODIUM 40 MG/0.4ML ~~LOC~~ SOLN
40.0000 mg | SUBCUTANEOUS | Status: DC
  Administered 2017-09-13 – 2017-09-25 (×13): 40 mg via SUBCUTANEOUS
  Filled 2017-09-12 (×13): qty 0.4

## 2017-09-12 NOTE — Progress Notes (Signed)
PCCM Progress Note  Subjective: Unable to tolerate PSV this AM, remains on PRVC now, no events overnight  Objective: Vitals:   09/12/17 1130 09/12/17 1200 09/12/17 1300 09/12/17 1400  BP: (!) 94/57 (!) 106/59 (!) 101/59 (!) 87/55  Pulse: 77 94 92 67  Resp: 19 16 (!) 26 12  Temp:      TempSrc:      SpO2: 97% 97% 98% 96%  Weight:      Height:       Vent Mode: PRVC FiO2 (%):  [50 %] 50 % Set Rate:  [12 bmp] 12 bmp Vt Set:  [380 mL] 380 mL PEEP:  [5 cmH20] 5 cmH20 Plateau Pressure:  [17 cmH20-30 cmH20] 23 cmH20  Intake/Output Summary (Last 24 hours) at 09/12/2017 1408 Last data filed at 09/12/2017 1400 Gross per 24 hour  Intake 3125 ml  Output 3560 ml  Net -435 ml   General: Chronically ill appearing female, on full support, sleeping HENT: [place]/AT, PERRL, EOM-I and MMM, trach site clean Pulm: Coarse BS diffusely Card: RRR, Nl S1/S2 and -M/R/G Abd: Peg in place, soft, NT, ND and +BS Neuro: moving all ext to command Skin: ecchymosis, intact  CBC    Component Value Date/Time   WBC 4.9 09/12/2017 0517   RBC 3.78 (L) 09/12/2017 0517   HGB 9.4 (L) 09/12/2017 0517   HCT 32.7 (L) 09/12/2017 0517   PLT 118 (L) 09/12/2017 0517   MCV 86.5 09/12/2017 0517   MCH 24.9 (L) 09/12/2017 0517   MCHC 28.7 (L) 09/12/2017 0517   RDW 18.1 (H) 09/12/2017 0517   LYMPHSABS 1.4 09/01/2017 0343   MONOABS 0.9 09/01/2017 0343   EOSABS 0.4 09/01/2017 0343   BASOSABS 0.0 09/01/2017 0343   BMET    Component Value Date/Time   NA 145 09/12/2017 0517   K 3.5 09/12/2017 0517   CL 96 (L) 09/12/2017 0517   CO2 40 (H) 09/12/2017 0517   GLUCOSE 108 (H) 09/12/2017 0517   BUN 11 09/12/2017 0517   CREATININE 0.50 09/12/2017 0517   CALCIUM 9.5 09/12/2017 0517   GFRNONAA >60 09/12/2017 0517   GFRAA >60 09/12/2017 0517   I reviewed CXR myself, trach is in good position.  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 Had been on prolastin  replacement weekly thru [facility].  Ventilator dependence Trach dependence  Coag neg SA bacteremia d/t indwelling IV access site. Now removed.   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  PCCM was consulted for vent management, trach management and alph-1-antitrypsin def.  I reviewed CXR myself, trach is in good position.  Discussed with PCCM-NP.  Awaiting placement.  Chronic respiratory failure:  - Continue weaning efforts as able, doubt will ever liberate from the ventilator  Hypoxemia:   - Titrate O2 for sat of 88-92%  COPD:  - Bronchodilators as ordered  - Continue ICS  Alpha-1-anti-trypsin deficiency:   - Prolastin to prevent further progression  Trach status:  - Maintain current trach size and type  PCCM will continue to follow for vent/trach/a1at.  Alyson Reedy, M.D. [facility] Pulmonary/Critical Care Medicine. Pager: [phone]. After hours pager: [phone].

## 2017-09-12 NOTE — Progress Notes (Signed)
[place] TEAM 1 - Stepdown/ICU TEAM  Danielle Stevens  CWC:[id number] DOB: 1971-03-28 DOA: 08/22/2017 PCP: Patient, No Pcp Per    Brief Narrative:  70 year oldfemale LTACH resident w/ a hx of alpha-1 antitrypsin deficiency and chronic ventilator dependent respiratory failure who presented unresponsive due to acute hypercapnic respiratory failure due to mucous plugging.  By the time she arrived in the ED EMS had actually essentially corrected her acute resp decline.      Significant Events: 4/26 admit 4/30 Port-a-cath removed in OR 5/7 PICC placed   Subjective: All acute issues addressed by PCCM during visit today.    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 Port removed 4/30 - continued IV antibiotic therapy through 09/10/17 as per ID suggestions - has a single lumen PICC as of 5/6 - now off abx tx - monitor for recurrent fever  Acute on chronic hypercapnic respiratory failure - COPD + A1 AT deficiency / advanced lung disease - chronic tracheostomy  Vent care per PCCM - plan to [facility] for possible long term wean attempts - cont bronchodilator therapy - on once weekly Prolastin for alpha 1 antitrypsin deficiency   DM2 CBG controlled  Episodic vomiting No exam findings to suggest ileus - tolerating TF without difficulty   Hypertension Blood pressure borderline low but stable   Hypokalemia  Cont to supplement and follow intermittently   Chronic multifactorial normocytic anemia Transfused 1U PRBC 5/9 - Hgb is stable  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 - follow trend - no bleeding issues at this time - off lovenox for now   Depression Continue clonazepam, quetiapine, buspirone, as needed lorazepam   DVT prophylaxis: lovenox  Code Status: FULL CODE Family Communication: no family present at time of exam  Disposition Plan: d/c to [facility] for chronic vent wean when bed available (CSW is following)  Consultants:   PCCM ID Gen Surgery   Antimicrobials:  Levofloxacin 4/26 > 4/30 Vancomycin 4/27 >  Objective: Blood pressure (!) 85/53, pulse 77, temperature 97.9 F (36.6 C), temperature source Oral, resp. rate 13, height '5\' 4"'$  (1.626 m), weight 78.5 kg (173 lb 1 oz), SpO2 95 %.  Intake/Output Summary (Last 24 hours) at 09/12/2017 1529 Last data filed at 09/12/2017 1400 Gross per 24 hour  Intake 3059.72 ml  Output 3560 ml  Net -500.28 ml   Filed Weights   09/10/17 0423 09/11/17 0500 09/12/17 0500  Weight: 78.8 kg (173 lb 11.6 oz) 79.4 kg (175 lb 0.7 oz) 78.5 kg (173 lb 1 oz)    Examination: No exam from [facility] today   CBC: Recent Labs  Lab 09/10/17 0839 09/11/17 0145 09/12/17 0517  WBC 4.2 6.3 4.9  HGB 5.4* 9.5* 9.4*  HCT 19.5* 32.6* 32.7*  MCV 87.8 85.3 86.5  PLT 75* 136* 761*   Basic Metabolic Panel: Recent Labs  Lab 09/07/17 0410 09/08/17 0321  09/11/17 0145 09/11/17 1230 09/12/17 0517  NA 143 146*   < > 142 141 145  K 3.1* 3.8   < > 3.1* 4.0 3.5  CL 100* 101   < > 98* 98* 96*  CO2 37* 34*   < > 37* 35* 40*  GLUCOSE 159* 116*   < > 142* 130* 108*  BUN 8 7   < > '8 9 11  '$ CREATININE 0.48 0.52   < > 0.52 0.48 0.50  CALCIUM 8.7* 9.1   < > 8.8* 9.1 9.5  MG 2.1 2.0  --   --  1.9  --    < > = values in this interval not displayed.   GFR: Estimated Creatinine Clearance: 87.2 mL/min (by C-G formula based on SCr of 0.5 mg/dL).  Liver Function Tests: Recent Labs  Lab 09/10/17 0515 09/11/17 0145  AST 18 20  ALT 18 18  ALKPHOS 65 61  BILITOT 0.5 0.6  PROT 5.8* 5.7*  ALBUMIN 2.7* 2.7*    CBG: Recent Labs  Lab 09/11/17 1547 09/11/17 2122 09/12/17 0805 09/12/17 1133 09/12/17 1513  GLUCAP 119* 117* 101* 113* 100*     Scheduled Meds: . acidophilus  1 capsule Oral Daily  . arformoterol  15 mcg Nebulization BID  . budesonide (PULMICORT) nebulizer solution  0.5 mg Nebulization BID  . busPIRone  15 mg Per Tube BID  . chlorhexidine gluconate (MEDLINE KIT)  15 mL  Mouth Rinse BID  . Chlorhexidine Gluconate Cloth  6 each Topical Daily  . clonazepam  0.5 mg Per Tube TID  . doxepin  10 mg Per Tube QHS  . [START ON 09/13/2017] enoxaparin (LOVENOX) injection  40 mg Subcutaneous Q24H  . famotidine  20 mg Per Tube BID  . free water  200 mL Per Tube Q4H  . insulin aspart  0-5 Units Subcutaneous QHS  . insulin aspart  0-9 Units Subcutaneous TID WC  . insulin glargine  18 Units Subcutaneous QHS  . ipratropium-albuterol  3 mL Nebulization TID  . mouth rinse  15 mL Mouth Rinse QID  . midodrine  5 mg Per Tube TID WC  . montelukast  10 mg Per Tube QHS  . multivitamin  15 mL Per Tube Daily  . PARoxetine  30 mg Per Tube Daily  . polyethylene glycol  17 g Per Tube Daily  . promethazine  12.5 mg Intravenous Once  . QUEtiapine  50 mg Per Tube BID  . sodium chloride flush  10-40 mL Intracatheter Q12H     LOS: 21 days   Cherene Altes, MD [facility] Office  [phone] Pager - Text Page per Amion as per below:  On-Call/Text Page:      [email or web address]      password [email or web address]  If 7PM-7AM, please contact night-coverage [email or web address] Password [facility] 09/12/2017, 3:29 PM

## 2017-09-13 LAB — CBC
HEMATOCRIT: 32.7 % — AB (ref 36.0–46.0)
HEMOGLOBIN: 9.4 g/dL — AB (ref 12.0–15.0)
MCH: 25.3 pg — ABNORMAL LOW (ref 26.0–34.0)
MCHC: 28.7 g/dL — ABNORMAL LOW (ref 30.0–36.0)
MCV: 87.9 fL (ref 78.0–100.0)
Platelets: 123 10*3/uL — ABNORMAL LOW (ref 150–400)
RBC: 3.72 MIL/uL — AB (ref 3.87–5.11)
RDW: 18.1 % — ABNORMAL HIGH (ref 11.5–15.5)
WBC: 4.8 10*3/uL (ref 4.0–10.5)

## 2017-09-13 LAB — GLUCOSE, CAPILLARY
Glucose-Capillary: 83 mg/dL (ref 65–99)
Glucose-Capillary: 112 mg/dL — ABNORMAL HIGH (ref 65–99)
Glucose-Capillary: 95 mg/dL (ref 65–99)
Glucose-Capillary: 92 mg/dL (ref 65–99)

## 2017-09-13 LAB — COMPREHENSIVE METABOLIC PANEL
ALBUMIN: 3.1 g/dL — AB (ref 3.5–5.0)
ALK PHOS: 50 U/L (ref 38–126)
ALT: 17 U/L (ref 14–54)
AST: 19 U/L (ref 15–41)
Anion gap: 7 (ref 5–15)
BILIRUBIN TOTAL: 0.7 mg/dL (ref 0.3–1.2)
BUN: 14 mg/dL (ref 6–20)
CALCIUM: 9.4 mg/dL (ref 8.9–10.3)
CO2: 43 mmol/L — AB (ref 22–32)
CREATININE: 0.53 mg/dL (ref 0.44–1.00)
Chloride: 97 mmol/L — ABNORMAL LOW (ref 101–111)
GFR calc Af Amer: >60 mL/min (ref >60)
GFR calc non Af Amer: >60 mL/min (ref >60)
GLUCOSE: 89 mg/dL (ref 65–99)
Potassium: 3.8 mmol/L (ref 3.5–5.1)
SODIUM: 147 mmol/L — AB (ref 135–145)
TOTAL PROTEIN: 5.8 g/dL — AB (ref 6.5–8.1)

## 2017-09-13 MED ORDER — PROMETHAZINE HCL 25 MG/ML IJ SOLN
12.5000 mg | Freq: Four times a day (QID) | INTRAMUSCULAR | Status: DC | PRN
  Administered 2017-09-15 – 2017-09-23 (×3): 12.5 mg via INTRAVENOUS
  Filled 2017-09-13 (×3): qty 1

## 2017-09-13 MED ORDER — ONDANSETRON HCL 4 MG/2ML IJ SOLN
4.0000 mg | Freq: Four times a day (QID) | INTRAMUSCULAR | Status: DC | PRN
  Administered 2017-09-13 – 2017-09-25 (×12): 4 mg via INTRAVENOUS
  Filled 2017-09-13 (×12): qty 2

## 2017-09-13 MED ORDER — FREE WATER
300.0000 mL | Status: DC
  Administered 2017-09-13 – 2017-09-25 (×63): 300 mL

## 2017-09-13 NOTE — Progress Notes (Signed)
[place] TEAM 1 - Stepdown/ICU TEAM  Danielle Stevens  FUX:[id number] DOB: Jun 01, 1970 DOA: 08/22/2017 PCP: Patient, No Pcp Per    Brief Narrative:  80 year oldfemale LTACH resident w/ a hx of alpha-1 antitrypsin deficiency and chronic ventilator dependent respiratory failure who presented unresponsive due to acute hypercapnic respiratory failure due to mucous plugging.  By the time she arrived in the ED EMS had actually essentially corrected her acute resp decline.      Significant Events: 4/26 admit 4/30 Port-a-cath removed in OR 5/7 PICC placed   Subjective: C/o some nausea/dyspepsia, but denies vomiting.   No sob or cp.    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 Port removed 4/30 - continued IV antibiotic therapy through 09/10/17 as per ID suggestions - has a single lumen PICC as of 5/6 - now off abx tx - no clinical sign of active infection presently   Acute on chronic hypercapnic respiratory failure - COPD + A1 AT deficiency / advanced lung disease - chronic tracheostomy  Vent care per PCCM - plan to return to [facility] SNF for possible long term wean attempts - cont bronchodilator therapy - on once weekly Prolastin for alpha 1 antitrypsin deficiency - stable presently   DM2 CBG controlled  Episodic vomiting No exam findings to suggest ileus - tolerating TF without difficulty   Chronic multifactorial normocytic anemia Transfused 1U PRBC 5/9 - Hgb stable  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 rate 14, height '5\' 4"'$  (1.626 m), weight 78.8 kg (173 lb 11.6 oz), SpO2 99 %.  Intake/Output Summary (Last 24 hours) at 09/13/2017 1624 Last data filed at 09/13/2017 1600 Gross per 24 hour  Intake 2721.38 ml  Output 831 ml  Net 1890.38 ml   Filed Weights   09/11/17 0500 09/12/17 0500 09/13/17 0459  Weight: 79.4 kg (175 lb 0.7 oz) 78.5 kg (173 lb 1 oz) 78.8 kg (173 lb 11.6 oz)    Examination: General: No acute respiratory distress Lungs: Clear to auscultation bilaterally without wheezes or crackles Cardiovascular: Regular rate and rhythm without murmur gallop or rub normal S1 and S2 Abdomen: Nontender, nondistended, soft, bowel sounds positive, no rebound, no ascites, no appreciable mass Extremities: No significant cyanosis, clubbing, or edema bilateral lower extremities  CBC: Recent Labs  Lab 09/11/17 0145 09/12/17 0517 09/13/17 0500  WBC 6.3 4.9 4.8  HGB 9.5* 9.4* 9.4*  HCT 32.6* 32.7* 32.7*  MCV 85.3 86.5 87.9  PLT 136* 118* 025*   Basic Metabolic Panel: Recent Labs  Lab 09/07/17 0410 09/08/17 0321  09/11/17 1230 09/12/17 0517 09/13/17 0500  NA 143 146*   < > 141 145 147*  K 3.1* 3.8   < > 4.0 3.5 3.8  CL 100* 101   < > 98* 96* 97*  CO2 37* 34*   < > 35* 40* 43*  GLUCOSE 159* 116*   < >  130* 108* 89  BUN 8 7   < > '9 11 14  '$ CREATININE 0.48 0.52   < > 0.48 0.50 0.53  CALCIUM 8.7* 9.1   < > 9.1 9.5 9.4  MG 2.1 2.0  --  1.9  --   --    < > = values in this interval not displayed.   GFR: Estimated Creatinine Clearance: 87.3 mL/min (by C-G formula based on SCr of 0.53 mg/dL).  Liver Function Tests: Recent Labs  Lab 09/10/17 0515 09/11/17 0145 09/13/17 0500  AST '18 20 19  '$ ALT '18 18 17  '$ ALKPHOS 65 61 50  BILITOT 0.5 0.6 0.7  PROT 5.8* 5.7* 5.8*  ALBUMIN 2.7* 2.7* 3.1*     CBG: Recent Labs  Lab 09/12/17 1513 09/12/17 2102 09/13/17 0736 09/13/17 1127 09/13/17 1615  GLUCAP 100* 105* 83 112* 95    Scheduled Meds: . acidophilus  1 capsule Oral Daily  . arformoterol  15 mcg Nebulization BID  . budesonide (PULMICORT) nebulizer solution  0.5 mg Nebulization BID  . busPIRone  15 mg Per Tube BID  . chlorhexidine gluconate (MEDLINE KIT)  15 mL Mouth Rinse BID  . Chlorhexidine Gluconate Cloth  6 each Topical Daily  . clonazepam  0.5 mg Per Tube TID  . doxepin  10 mg Per Tube QHS  . enoxaparin (LOVENOX) injection  40 mg Subcutaneous Q24H  . famotidine  20 mg Per Tube BID  . free water  200 mL Per Tube Q4H  . insulin aspart  0-5 Units Subcutaneous QHS  . insulin aspart  0-9 Units Subcutaneous TID WC  . insulin glargine  18 Units Subcutaneous QHS  . ipratropium-albuterol  3 mL Nebulization TID  . mouth rinse  15 mL Mouth Rinse QID  . midodrine  5 mg Per Tube TID WC  . montelukast  10 mg Per Tube QHS  . multivitamin  15 mL Per Tube Daily  . PARoxetine  30 mg Per Tube Daily  . polyethylene glycol  17 g Per Tube Daily  . promethazine  12.5 mg Intravenous Once  . QUEtiapine  50 mg Per Tube BID  . sodium chloride flush  10-40 mL Intracatheter Q12H     LOS: 22 days   Cherene Altes, MD [facility] Office  [phone] Pager - Text Page per Amion as per below:  On-Call/Text Page:      [email or web address]      password [email or web address]  If 7PM-7AM, please contact night-coverage [email or web address] Password [facility] [place] 09/13/2017, 4:24 PM

## 2017-09-14 LAB — GLUCOSE, CAPILLARY
GLUCOSE-CAPILLARY: 44 mg/dL — AB (ref 65–99)
GLUCOSE-CAPILLARY: 99 mg/dL (ref 65–99)
Glucose-Capillary: 49 mg/dL — ABNORMAL LOW (ref 65–99)
Glucose-Capillary: 53 mg/dL — ABNORMAL LOW (ref 65–99)
Glucose-Capillary: 82 mg/dL (ref 65–99)
Glucose-Capillary: 81 mg/dL (ref 65–99)
Glucose-Capillary: 101 mg/dL — ABNORMAL HIGH (ref 65–99)

## 2017-09-14 LAB — BASIC METABOLIC PANEL
ANION GAP: 11 (ref 5–15)
BUN: 14 mg/dL (ref 6–20)
CALCIUM: 9.6 mg/dL (ref 8.9–10.3)
CO2: 38 mmol/L — AB (ref 22–32)
Chloride: 95 mmol/L — ABNORMAL LOW (ref 101–111)
Creatinine, Ser: 0.47 mg/dL (ref 0.44–1.00)
GFR calc Af Amer: >60 mL/min (ref >60)
Glucose, Bld: 56 mg/dL — ABNORMAL LOW (ref 65–99)
Potassium: 3.6 mmol/L (ref 3.5–5.1)
Sodium: 144 mmol/L (ref 135–145)

## 2017-09-14 MED ORDER — BUSPIRONE HCL 5 MG PO TABS: 15.0000 mg | ORAL_TABLET | Freq: Two times a day (BID) | ORAL | Status: DC

## 2017-09-14 MED ORDER — INSULIN GLARGINE 100 UNIT/ML ~~LOC~~ SOLN
14.0000 [IU] | Freq: Every day | SUBCUTANEOUS | Status: DC
  Administered 2017-09-15 – 2017-09-24 (×9): 14 [IU] via SUBCUTANEOUS
  Filled 2017-09-14 (×12): qty 0.14

## 2017-09-14 MED ORDER — CLONAZEPAM 0.5 MG PO TBDP
1.0000 mg | ORAL_TABLET | Freq: Three times a day (TID) | ORAL | Status: DC
  Administered 2017-09-14 – 2017-09-25 (×32): 1 mg
  Filled 2017-09-14 (×32): qty 2

## 2017-09-14 MED ORDER — DEXTROSE 50 % IV SOLN
INTRAVENOUS | Status: AC
  Administered 2017-09-14: 50 mL
  Filled 2017-09-14: qty 50

## 2017-09-14 MED ORDER — CLONAZEPAM 0.5 MG PO TBDP: 0.5000 mg | ORAL_TABLET | Freq: Three times a day (TID) | ORAL | Status: DC

## 2017-09-14 MED ORDER — QUETIAPINE FUMARATE 50 MG PO TABS: 50.0000 mg | ORAL_TABLET | Freq: Two times a day (BID) | ORAL | Status: DC

## 2017-09-14 MED ORDER — INSULIN GLARGINE 100 UNIT/ML ~~LOC~~ SOLN
10.0000 [IU] | Freq: Every day | SUBCUTANEOUS | Status: DC
  Filled 2017-09-14: qty 0.1

## 2017-09-14 NOTE — Progress Notes (Signed)
PCCM Progress Note  Subjective: Unable to tolerate PSV again this AM, back to full support, no new complaints  Objective: Vitals:   09/14/17 1127 09/14/17 1130 09/14/17 1145 09/14/17 1200  BP:  96/69  96/64  Pulse:  73 75 67  Resp:  17 20 19   Temp: 97.6 F (36.4 C)     TempSrc: Oral     SpO2:  98% 98% 98%  Weight:      Height:       Vent Mode: PRVC FiO2 (%):  [50 %] 50 % Set Rate:  [12 bmp] 12 bmp Vt Set:  [380 mL] 380 mL PEEP:  [5 cmH20] 5 cmH20 Plateau Pressure:  [17 cmH20-22 cmH20] 22 cmH20  Intake/Output Summary (Last 24 hours) at 09/14/2017 1402 Last data filed at 09/14/2017 1200 Gross per 24 hour  Intake 2563.28 ml  Output 625 ml  Net 1938.28 ml   General: Chronically ill appearing female, NAD on vent, sleeping HENT: [place]/AT, PERRL, EOM-I and MMM Pulm: Coarse BS diffusely Card: RRR, Nl S1/S2 and -M/R/G. Abd: Soft, NT, ND and +BS Neuro: Alert and interactive, moving all ext to command Skin: ecchymosis, intact  CBC    Component Value Date/Time   WBC 4.8 09/13/2017 0500   RBC 3.72 (L) 09/13/2017 0500   HGB 9.4 (L) 09/13/2017 0500   HCT 32.7 (L) 09/13/2017 0500   PLT 123 (L) 09/13/2017 0500   MCV 87.9 09/13/2017 0500   MCH 25.3 (L) 09/13/2017 0500   MCHC 28.7 (L) 09/13/2017 0500   RDW 18.1 (H) 09/13/2017 0500   LYMPHSABS 1.4 09/01/2017 0343   MONOABS 0.9 09/01/2017 0343   EOSABS 0.4 09/01/2017 0343   BASOSABS 0.0 09/01/2017 0343   BMET    Component Value Date/Time   NA 144 09/14/2017 0526   K 3.6 09/14/2017 0526   CL 95 (L) 09/14/2017 0526   CO2 38 (H) 09/14/2017 0526   GLUCOSE 56 (L) 09/14/2017 0526   BUN 14 09/14/2017 0526   CREATININE 0.47 09/14/2017 0526   CALCIUM 9.6 09/14/2017 0526   GFRNONAA >60 09/14/2017 0526   GFRAA >60 09/14/2017 0526   I reviewed CXR myself, trach is in good position, hyperinflated  Impression/Plan:  Severe chronic respiratory failure with 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 Had been on prolastin replacement  weekly thru [facility].  Ventilator dependence Trach dependence  Coag neg SA bacteremia d/t indwelling IV access site. Now removed.   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  PCCM was consulted for vent management, trach management and alph-1-antitrypsin def.  I reviewed CXR myself, trach is in good position.  Discussed with PCCM-NP.  Awaiting placement.  Chronic respiratory failure:  - Continue weaning efforts but doubt will be able to come of the vent, would look for a vent SNF at this point  Hypoxemia:   - Titrate O2 for sat of 88-92%  COPD:  - Continue bronchodilators as ordered  - Continue ICS  Alpha-1-anti-trypsin deficiency:   - Prolastin to prevent further progression  Trach status:  - Maintain current trach type and size  PCCM will continue to follow for vent/trach/a1at.  Alyson Reedy, M.D. [facility] Pulmonary/Critical Care Medicine. Pager: [phone]. After hours pager: [phone].

## 2017-09-14 NOTE — Progress Notes (Signed)
CRITICAL VALUE ALERT  Critical Value:  Troponin 3.25  Date & Time Notied:  09/14/2017 0400  Provider Notified: Pola Corn  Orders Received/Actions taken:

## 2017-09-14 NOTE — Progress Notes (Signed)
[place] TEAM 1 - Stepdown/ICU TEAM  Sheria Rosello  [id number] DOB: [birth year] DOA: 08/22/2017 PCP: Patient, No Pcp Per    Brief Narrative:  77 year oldfemale LTACH resident w/ a hx of alpha-1 antitrypsin deficiency and chronic ventilator dependent respiratory failure who presented unresponsive due to acute hypercapnic respiratory failure due to mucous plugging.  By the time she arrived in the ED EMS had actually essentially corrected her acute resp decline.      Significant Events: 4/26 admit 4/30 Port-a-cath removed in OR 5/7 PICC placed   Subjective: Has been quite anxious today.  Asks to be suctioned very frequently.  No cp, but c/o intermittent nausea w/o vomiting.    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 Port removed 4/30 - continued IV antibiotic therapy through 09/10/17 as per ID suggestions - has a single lumen PICC as of 5/6 - now off abx tx - no clinical sign of active infection presently   Acute on chronic hypercapnic respiratory failure - COPD + A1 AT deficiency / advanced lung disease - chronic tracheostomy  Vent care per PCCM - plan to return to [facility] for possible long term wean attempts - cont bronchodilator therapy - on once weekly Prolastin for alpha 1 antitrypsin deficiency - awaiting Vent SNF bed   DM2 Developing hypoglycemia today due to holding of tube feeds - tube feeding resumed - follow   Chronic multifactorial normocytic anemia Transfused 1U PRBC 5/9 - Hgb stable  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 - follow trend - no bleeding issues at this time - off lovenox for now - PLT count is climbing   Depression Continue clonazepam, quetiapine, buspirone, as needed lorazepam   Hypernatremia  Corrected w/ increased free water    DVT prophylaxis: SCDs Code Status: FULL CODE Family Communication: no family present at time of exam  Disposition Plan: d/c to [facility] for chronic vent wean when bed  available (CSW is following)  Consultants:  PCCM ID Gen Surgery   Antimicrobials:  Levofloxacin 4/26 > 4/30 Vancomycin 4/27 > 5/15  Objective: Blood pressure 108/73, pulse 94, temperature 98.1 F (36.7 C), temperature source Oral, resp. rate (!) 21, height '5\' 4"'$  (1.626 m), weight 80.1 kg (176 lb 9.4 oz), SpO2 98 %.  Intake/Output Summary (Last 24 hours) at 09/14/2017 1648 Last data filed at 09/14/2017 1600 Gross per 24 hour  Intake 2454.65 ml  Output 625 ml  Net 1829.65 ml   Filed Weights   09/12/17 0500 09/13/17 0459 09/14/17 0500  Weight: 78.5 kg (173 lb 1 oz) 78.8 kg (173 lb 11.6 oz) 80.1 kg (176 lb 9.4 oz)    Examination: General: pt is anxious and tachypneic  Lungs: Clear to auscultation bilaterally - no wheezing  Cardiovascular: tachycardic - regular  Abdomen: Nontender, nondistended, soft, bowel sounds positive Extremities: No signif edema bilateral lower extremities  CBC: Recent Labs  Lab 09/11/17 0145 09/12/17 0517 09/13/17 0500  WBC 6.3 4.9 4.8  HGB 9.5* 9.4* 9.4*  HCT 32.6* 32.7* 32.7*  MCV 85.3 86.5 87.9  PLT 136* 118* 242*   Basic Metabolic Panel: Recent Labs  Lab 09/08/17 0321  09/11/17 1230 09/12/17 0517 09/13/17 0500 09/14/17 0526  NA 146*   < > 141 145 147* 144  K 3.8   < > 4.0 3.5 3.8 3.6  CL 101   < > 98* 96* 97* 95*  CO2 34*   < > 35* 40* 43* 38*  GLUCOSE 116*   < >  130* 108* 89 56*  BUN 7   < > '9 11 14 14  '$ CREATININE 0.52   < > 0.48 0.50 0.53 0.47  CALCIUM 9.1   < > 9.1 9.5 9.4 9.6  MG 2.0  --  1.9  --   --   --    < > = values in this interval not displayed.   GFR: Estimated Creatinine Clearance: 88.1 mL/min (by C-G formula based on SCr of 0.47 mg/dL).  Liver Function Tests: Recent Labs  Lab 09/10/17 0515 09/11/17 0145 09/13/17 0500  AST '18 20 19  '$ ALT '18 18 17  '$ ALKPHOS 65 61 50  BILITOT 0.5 0.6 0.7  PROT 5.8* 5.7* 5.8*  ALBUMIN 2.7* 2.7* 3.1*    CBG: Recent Labs  Lab 09/13/17 2123 09/14/17 0812 09/14/17 0815  09/14/17 1141 09/14/17 1528  GLUCAP 92 49* 53* 82 44*    Scheduled Meds: . acidophilus  1 capsule Oral Daily  . arformoterol  15 mcg Nebulization BID  . budesonide (PULMICORT) nebulizer solution  0.5 mg Nebulization BID  . busPIRone  15 mg Per Tube BID  . chlorhexidine gluconate (MEDLINE KIT)  15 mL Mouth Rinse BID  . Chlorhexidine Gluconate Cloth  6 each Topical Daily  . clonazepam  0.5 mg Per Tube TID  . doxepin  10 mg Per Tube QHS  . enoxaparin (LOVENOX) injection  40 mg Subcutaneous Q24H  . famotidine  20 mg Per Tube BID  . free water  300 mL Per Tube Q4H  . insulin aspart  0-5 Units Subcutaneous QHS  . insulin aspart  0-9 Units Subcutaneous TID WC  . insulin glargine  18 Units Subcutaneous QHS  . ipratropium-albuterol  3 mL Nebulization TID  . mouth rinse  15 mL Mouth Rinse QID  . midodrine  5 mg Per Tube TID WC  . montelukast  10 mg Per Tube QHS  . multivitamin  15 mL Per Tube Daily  . PARoxetine  30 mg Per Tube Daily  . polyethylene glycol  17 g Per Tube Daily  . QUEtiapine  50 mg Per Tube BID  . sodium chloride flush  10-40 mL Intracatheter Q12H     LOS: 23 days   Cherene Altes, MD [facility] Office  [phone] Pager - Text Page per Amion as per below:  On-Call/Text Page:      [email or web address]      password [email or web address]  If 7PM-7AM, please contact night-coverage [email or web address] Password [facility] 09/14/2017, 4:48 PM

## 2017-09-15 ENCOUNTER — Inpatient Hospital Stay (HOSPITAL_COMMUNITY): Payer: Medicare Other

## 2017-09-15 DIAGNOSIS — R0602 Shortness of breath: Secondary | ICD-10-CM

## 2017-09-15 LAB — GLUCOSE, CAPILLARY
GLUCOSE-CAPILLARY: 153 mg/dL — AB (ref 65–99)
GLUCOSE-CAPILLARY: 156 mg/dL — AB (ref 65–99)
Glucose-Capillary: 167 mg/dL — ABNORMAL HIGH (ref 65–99)
Glucose-Capillary: 133 mg/dL — ABNORMAL HIGH (ref 65–99)
Glucose-Capillary: 123 mg/dL — ABNORMAL HIGH (ref 65–99)

## 2017-09-15 MED ORDER — MORPHINE SULFATE (PF) 4 MG/ML IV SOLN: 2.0000 mg | INTRAVENOUS | Status: DC | PRN

## 2017-09-15 MED ORDER — FENTANYL CITRATE (PF) 100 MCG/2ML IJ SOLN
INTRAMUSCULAR | Status: AC
  Administered 2017-09-15: 50 ug via INTRAVENOUS
  Filled 2017-09-15: qty 2

## 2017-09-15 MED ORDER — FENTANYL CITRATE (PF) 100 MCG/2ML IJ SOLN
50.0000 ug | Freq: Once | INTRAMUSCULAR | Status: AC
  Administered 2017-09-15: 50 ug via INTRAVENOUS

## 2017-09-15 MED ORDER — DEXMEDETOMIDINE HCL IN NACL 400 MCG/100ML IV SOLN
0.4000 ug/kg/h | INTRAVENOUS | Status: DC
  Administered 2017-09-15: 1.2 ug/kg/h via INTRAVENOUS
  Administered 2017-09-16: 0.8 ug/kg/h via INTRAVENOUS
  Filled 2017-09-15 (×2): qty 100

## 2017-09-15 MED ORDER — OXYCODONE HCL 5 MG/5ML PO SOLN
5.0000 mg | ORAL | Status: DC | PRN
  Administered 2017-09-19 – 2017-09-22 (×11): 10 mg
  Administered 2017-09-22: 5 mg
  Filled 2017-09-15 (×5): qty 10
  Filled 2017-09-15: qty 5
  Filled 2017-09-15 (×6): qty 10

## 2017-09-15 MED ORDER — FUROSEMIDE 10 MG/ML IJ SOLN
40.0000 mg | Freq: Once | INTRAMUSCULAR | Status: AC
  Administered 2017-09-15: 40 mg via INTRAVENOUS
  Filled 2017-09-15: qty 4

## 2017-09-15 MED ORDER — ACETAMINOPHEN 160 MG/5ML PO SOLN
500.0000 mg | Freq: Four times a day (QID) | ORAL | Status: DC | PRN
  Administered 2017-09-21: 500 mg
  Filled 2017-09-15: qty 20.3

## 2017-09-15 NOTE — Progress Notes (Signed)
PT Cancellation Note  Patient Details Name: Danielle Stevens MRN: [id number] DOB: 09-12-1970   Cancelled Treatment:    Reason Eval/Treat Not Completed: (P) Patient declined, no reason specified Pt encouraged to participate in PT today however request PT come back tomorrow.   Leidi Astle B. Beverely Risen PT, DPT Acute Rehabilitation  [phone] Pager [phone]   Elon Alas Fleet 09/15/2017, 2:34 PM

## 2017-09-15 NOTE — Progress Notes (Signed)
[place] TEAM 1 - Stepdown/ICU TEAM  Danielle Stevens  OYD:[id number] DOB: [birth year] DOA: 08/22/2017 PCP: Patient, No Pcp Per    Brief Narrative:  78 year oldfemale LTACH resident w/ a hx of alpha-1 antitrypsin deficiency and chronic ventilator dependent respiratory failure who presented unresponsive due to acute hypercapnic respiratory failure due to mucous plugging.  By the time she arrived in the ED EMS had actually essentially corrected her acute resp decline.      Significant Events: 4/26 admit 4/30 Port-a-cath removed in OR 5/7 PICC placed   Subjective: The pt continues to suffer w/ frequent episodes of resp distress, marked by tachypnea and high peak pressures on the vent.  PCCM has addressed all active issues today.     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 Port removed 4/30 - continued IV antibiotic therapy through 09/10/17 as per ID suggestions - has a single lumen PICC as of 5/6 - now off abx tx - no clinical sign of active infection presently   Acute on chronic hypercapnic respiratory failure - COPD + A1 AT deficiency / advanced lung disease - chronic tracheostomy  Vent care per PCCM - plan to return to Vent SNF for possible long term wean attempts but presently not stable for d/c due to intermittent episodes of acute resp distress - cont bronchodilator therapy - on once weekly Prolastin for alpha 1 antitrypsin deficiency  DM2 CBGs have stabilized for now - follow trend w/o change today   Chronic multifactorial normocytic anemia Transfused 1U PRBC 5/9 - Hgb stable  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 rate (!) 21, height '5\' 4"'$  (1.626 m), weight 79.6 kg (175 lb 7.8 oz), SpO2 92 %.  Intake/Output Summary (Last 24 hours) at 09/15/2017 1132 Last data filed at 09/15/2017 1100 Gross per 24 hour  Intake 2730.78 ml  Output 1005 ml  Net 1725.78 ml   Filed Weights   09/13/17 0459 09/14/17 0500 09/15/17 0503  Weight: 78.8 kg (173 lb 11.6 oz) 80.1 kg (176 lb 9.4 oz) 79.6 kg (175 lb 7.8 oz)    Examination: No exam today   CBC: Recent Labs  Lab 09/11/17 0145 09/12/17 0517 09/13/17 0500  WBC 6.3 4.9 4.8  HGB 9.5* 9.4* 9.4*  HCT 32.6* 32.7* 32.7*  MCV 85.3 86.5 87.9  PLT 136* 118* 672*   Basic Metabolic Panel: Recent Labs  Lab 09/11/17 1230 09/12/17 0517 09/13/17 0500 09/14/17 0526  NA 141 145 147* 144  K 4.0 3.5 3.8 3.6  CL 98* 96* 97* 95*  CO2 35* 40* 43* 38*  GLUCOSE 130* 108* 89 56*  BUN '9 11 14 14  '$ CREATININE 0.48 0.50 0.53 0.47  CALCIUM 9.1 9.5 9.4 9.6  MG 1.9  --   --   --    GFR: Estimated Creatinine Clearance: 87.8 mL/min (by C-G formula based on SCr of 0.47 mg/dL).  Liver Function Tests: Recent Labs  Lab 09/10/17  0515 09/11/17 0145 09/13/17 0500  AST '18 20 19  '$ ALT '18 18 17  '$ ALKPHOS 65 61 50  BILITOT 0.5 0.6 0.7  PROT 5.8* 5.7* 5.8*  ALBUMIN 2.7* 2.7* 3.1*    CBG: Recent Labs  Lab 09/14/17 1528 09/14/17 1814 09/14/17 2026 09/14/17 2244 09/15/17 0802  GLUCAP 44* 81 101* 99 167*    Scheduled Meds: . acidophilus  1 capsule Oral Daily  . arformoterol  15 mcg Nebulization BID  . budesonide (PULMICORT) nebulizer solution  0.5 mg Nebulization BID  . busPIRone  15 mg Per Tube BID  . chlorhexidine gluconate (MEDLINE KIT)  15 mL Mouth Rinse BID  . Chlorhexidine Gluconate Cloth  6 each Topical Daily  .  clonazepam  1 mg Per Tube TID  . doxepin  10 mg Per Tube QHS  . enoxaparin (LOVENOX) injection  40 mg Subcutaneous Q24H  . famotidine  20 mg Per Tube BID  . free water  300 mL Per Tube Q4H  . furosemide  40 mg Intravenous Once  . insulin aspart  0-5 Units Subcutaneous QHS  . insulin aspart  0-9 Units Subcutaneous TID WC  . insulin glargine  14 Units Subcutaneous QHS  . ipratropium-albuterol  3 mL Nebulization TID  . mouth rinse  15 mL Mouth Rinse QID  . midodrine  5 mg Per Tube TID WC  . montelukast  10 mg Per Tube QHS  . multivitamin  15 mL Per Tube Daily  . PARoxetine  30 mg Per Tube Daily  . polyethylene glycol  17 g Per Tube Daily  . QUEtiapine  50 mg Per Tube BID  . sodium chloride flush  10-40 mL Intracatheter Q12H     LOS: 24 days   Cherene Altes, MD [facility] Office  [phone] Pager - Text Page per Amion as per below:  On-Call/Text Page:      [email or web address]      password [email or web address]  If 7PM-7AM, please contact night-coverage [email or web address] Password [facility] 09/15/2017, 11:32 AM

## 2017-09-15 NOTE — Progress Notes (Signed)
PCCM Progress Note  Subjective: Agitation +/- worsening dyspnea this AM. Peak pressures more elevated than they have been. Seems like all of this started yesterday.   Objective: Vitals:   09/15/17 0800 09/15/17 0834 09/15/17 0900 09/15/17 1000  BP: (!) 85/66  (!) 145/80 125/69  Pulse: 92  (!) 134 (!) 142  Resp: (!) 21  20 (!) 24  Temp:      TempSrc:      SpO2: 92% 92% 92% (!) 87%  Weight:      Height:       Vent Mode: PRVC FiO2 (%):  [50 %] 50 % Set Rate:  [12 bmp] 12 bmp Vt Set:  [380 mL] 380 mL PEEP:  [5 cmH20] 5 cmH20 Plateau Pressure:  [21 cmH20-38 cmH20] 38 cmH20  Intake/Output Summary (Last 24 hours) at 09/15/2017 1026 Last data filed at 09/15/2017 0900 Gross per 24 hour  Intake 2633.38 ml  Output 805 ml  Net 1828.38 ml   General: Chronically ill appearing Stevens, Overweight HENT: [place]/AT, PERRL, no JVD Pulm: Coarse BS diffusely Card: RRR, no MRG Abd: Soft, NT, ND and +BS Neuro: Alert and interactive,Follows commands Skin: Grossly intact. Areas of ecchymosis    CBC    Component Value Date/Time   WBC 4.8 09/13/2017 0500   RBC 3.72 (L) 09/13/2017 0500   HGB 9.4 (L) 09/13/2017 0500   HCT 32.7 (L) 09/13/2017 0500   PLT 123 (L) 09/13/2017 0500   MCV 87.9 09/13/2017 0500   MCH 25.3 (L) 09/13/2017 0500   MCHC 28.7 (L) 09/13/2017 0500   RDW 18.1 (H) 09/13/2017 0500   LYMPHSABS 1.4 09/01/2017 0343   MONOABS 0.9 09/01/2017 0343   EOSABS 0.4 09/01/2017 0343   BASOSABS 0.0 09/01/2017 0343   BMET    Component Value Date/Time   NA 144 09/14/2017 0526   K 3.6 09/14/2017 0526   CL 95 (L) 09/14/2017 0526   CO2 38 (H) 09/14/2017 0526   GLUCOSE 56 (L) 09/14/2017 0526   BUN 14 09/14/2017 0526   CREATININE 0.47 09/14/2017 0526   CALCIUM 9.6 09/14/2017 0526   GFRNONAA >60 09/14/2017 0526   GFRAA >60 09/14/2017 0526   I reviewed CXR myself, trach is in good position, hyperinflated  Impression/Plan:  Severe chronic respiratory failure with hypoxemia from COPD from  A-1-AT deficiency. Had been on prolastin replacement weekly [facility].  Ventilator dependence Trach dependence  Coag neg SA bacteremia d/t indwelling IV access site. Now removed. ABX completed.   47 year old Stevens with alpha-1-anti-trypsin deficiency who is advanced enough for respiratory failure and chronic trach.  PCCM was consulted for vent management, trach management and alph-1-antitrypsin def.  Somewhat more anxious and dyspneic today with increased peak pressures.   Chronic respiratory failure: secondary to COPD from A1A deficiency.  - Chest Xray reviewed by me. No acute changes.  - Continue weaning efforts, but doubt will be able to come of the vent. Would look for a vent SNF at this point.  Trach status: - Maintain current trach type and size Hypoxemia:  - Titrate O2 for sat of 88-92%  COPD: - Continue nebulized bronchodilators - Continue inhaled corticosteroid.   Alpha-1-anti-trypsin deficiency:  - Prolastin to prevent further progression  PCCM will continue to follow for vent/trach/a1at.  Joneen Roach, AGACNP-BC [facility] Pulmonology/Critical Care Pager [phone] or [phone]  09/15/2017 10:54 AM

## 2017-09-15 NOTE — Progress Notes (Signed)
Inpatient Diabetes Program Recommendations  AACE/ADA: New Consensus Statement on Inpatient Glycemic Control (2015)  Target Ranges:  Prepandial:   less than 140 mg/dL      Peak postprandial:   less than 180 mg/dL (1-2 hours)      Critically ill patients:  140 - 180 mg/dL   Lab Results  Component Value Date   GLUCAP 167 (H) 09/15/2017   HGBA1C 5.1 09/12/2017    Review of Glycemic Control Results for Danielle Stevens, Danielle Stevens (MRN [id number]) as of 09/15/2017 08:38  Ref. Range 09/14/2017 15:28 09/14/2017 18:14 09/14/2017 20:26 09/14/2017 22:44 09/15/2017 08:02  Glucose-Capillary Latest Ref Range: 65 - 99 mg/dL 44 (LL) 81 383 (H) 99 291 (H)   Diabetes history: Type 2 DM Outpatient Diabetes medications: Novolog 3-13 units TID, Levemir 18 units QHS Current orders for Inpatient glycemic control: Lantus 14 units QHS, Novolog 0-9 units TID, Novolog 0-5 units QHS  Inpatient Diabetes Program Recommendations:    Noted decrease to Lantus from 18 units to 14 units due to hypoglycemic episode. Per McClung's note tube feeds were held, which appears per documentation was about three hour hold. Will continue to watch.  Also of note, patient has an A1C of 5.1% indicating that patient may be experiencing lows at [facility] facility on current home regimen. May need to consider decreasing Levemir at discharge.   Thanks, Lujean Rave, MSN, RNC-OB Diabetes Coordinator [phone] (8a-5p)

## 2017-09-15 NOTE — Progress Notes (Signed)
CPT held at this time due to anxiety. BBS to ausculation reveals tight decrease aeration throughout with poor air exchange. Scheduled neb treatment given at this time. RN aware of patient anxiety

## 2017-09-15 NOTE — Progress Notes (Signed)
[facility] unable to accept patient due to Prolastin- state they can't do infusion at the SNF so would have to transfer to hospital weekly and have respiratory therapist go with her to the hospital - facility director unwilling to do this.  CSW informed pt dtr who expressed understanding- patient unable to be placed elsewhere unless Prolastin stopped- dtr agreeable to pt return to [facility]  [facility] might have bed available tomorrow if one of there other patients doesn't return to them but otherwise has a planned DC for 5/28 and would be able to accept patient back then  CSW updated pt at bedside- patient drowsy but nodded when addressed  Burna Sis, LCSW Clinical Social Worker [phone]

## 2017-09-15 NOTE — Progress Notes (Signed)
On arrival to patient room noted PIP in the upper 50's 56-59cmh2o. Pt is in no distress, patient is sleeping comfortably and SATs remain stable. BBS to auscultation reveals tight decrease aeration throughout. PRN neb given, Pplat were assessed and noted No Poor compliance. Note increase Raw, Pplat are 22-26cmh20 at this time. Tried to pass the suction catheter noted resistance. IC was changed at 1945 noted no obstruction. IC was removed and tried to pass the suction catheter still increase resistance, must be in occulusion within the trach flange. Patient was manually ventilated bagged lavage and noted increase resistance in manually ventilation via bag ventilation. Suction catheter still wont pass at this time. Vent Filters were changed, HME filter were changed still noted increase PIP's. Patient is hemodynamically stable. RN aware.

## 2017-09-16 ENCOUNTER — Inpatient Hospital Stay (HOSPITAL_COMMUNITY): Payer: Medicare Other

## 2017-09-16 LAB — DIFFERENTIAL
ABS IMMATURE GRANULOCYTES: 0 10*3/uL (ref 0.0–0.1)
Basophils Absolute: 0 10*3/uL (ref 0.0–0.1)
Basophils Relative: 0 %
EOS PCT: 2 %
Eosinophils Absolute: 0.2 10*3/uL (ref 0.0–0.7)
Immature Granulocytes: 1 %
LYMPHS ABS: 0.8 10*3/uL (ref 0.7–4.0)
Lymphocytes Relative: 13 %
MONOS PCT: 7 %
Monocytes Absolute: 0.4 10*3/uL (ref 0.1–1.0)
Neutro Abs: 4.8 10*3/uL (ref 1.7–7.7)
Neutrophils Relative %: 77 %

## 2017-09-16 LAB — COMPREHENSIVE METABOLIC PANEL
ALBUMIN: 2.9 g/dL — AB (ref 3.5–5.0)
ALT: 16 U/L (ref 14–54)
ANION GAP: 7 (ref 5–15)
AST: 18 U/L (ref 15–41)
Alkaline Phosphatase: 50 U/L (ref 38–126)
BUN: 13 mg/dL (ref 6–20)
CHLORIDE: 95 mmol/L — AB (ref 101–111)
CO2: 41 mmol/L — ABNORMAL HIGH (ref 22–32)
Calcium: 9.3 mg/dL (ref 8.9–10.3)
Creatinine, Ser: 0.5 mg/dL (ref 0.44–1.00)
GFR calc Af Amer: >60 mL/min (ref >60)
GFR calc non Af Amer: >60 mL/min (ref >60)
Glucose, Bld: 160 mg/dL — ABNORMAL HIGH (ref 65–99)
POTASSIUM: 3.2 mmol/L — AB (ref 3.5–5.1)
Sodium: 143 mmol/L (ref 135–145)
Total Bilirubin: 0.6 mg/dL (ref 0.3–1.2)
Total Protein: 5.5 g/dL — ABNORMAL LOW (ref 6.5–8.1)

## 2017-09-16 LAB — CBC
HEMATOCRIT: 33.1 % — AB (ref 36.0–46.0)
HEMOGLOBIN: 9.4 g/dL — AB (ref 12.0–15.0)
MCH: 25.1 pg — ABNORMAL LOW (ref 26.0–34.0)
MCHC: 28.4 g/dL — ABNORMAL LOW (ref 30.0–36.0)
MCV: 88.5 fL (ref 78.0–100.0)
Platelets: 154 10*3/uL (ref 150–400)
RBC: 3.74 MIL/uL — ABNORMAL LOW (ref 3.87–5.11)
RDW: 17.1 % — AB (ref 11.5–15.5)
WBC: 6 10*3/uL (ref 4.0–10.5)

## 2017-09-16 LAB — GLUCOSE, CAPILLARY
GLUCOSE-CAPILLARY: 103 mg/dL — AB (ref 65–99)
GLUCOSE-CAPILLARY: 166 mg/dL — AB (ref 65–99)
Glucose-Capillary: 84 mg/dL (ref 65–99)
Glucose-Capillary: 165 mg/dL — ABNORMAL HIGH (ref 65–99)
Glucose-Capillary: 165 mg/dL — ABNORMAL HIGH (ref 65–99)

## 2017-09-16 LAB — BLOOD GAS, ARTERIAL
Acid-Base Excess: 14.1 mmol/L — ABNORMAL HIGH (ref 0.0–2.0)
BICARBONATE: 40.2 mmol/L — AB (ref 20.0–28.0)
Drawn by: 441661
FIO2: 50
LHR: 20 {breaths}/min
MECHVT: 380 mL
O2 SAT: 96.2 %
PATIENT TEMPERATURE: 98.6
PCO2 ART: 74.4 mmHg — AB (ref 32.0–48.0)
PEEP: 5 cmH2O
PH ART: 7.352 (ref 7.350–7.450)
PO2 ART: 83.9 mmHg (ref 83.0–108.0)

## 2017-09-16 LAB — TRIGLYCERIDES: Triglycerides: 52 mg/dL (ref <150)

## 2017-09-16 MED ORDER — FENTANYL 2500MCG IN NS 250ML (10MCG/ML) PREMIX INFUSION
25.0000 ug/h | INTRAVENOUS | Status: AC
  Administered 2017-09-16: 25 ug/h via INTRAVENOUS
  Administered 2017-09-17: 100 ug/h via INTRAVENOUS
  Administered 2017-09-18: 125 ug/h via INTRAVENOUS
  Filled 2017-09-16 (×3): qty 250

## 2017-09-16 MED ORDER — IOPAMIDOL (ISOVUE-370) INJECTION 76%
100.0000 mL | Freq: Once | INTRAVENOUS | Status: AC | PRN
  Administered 2017-09-16: 40 mL via INTRAVENOUS

## 2017-09-16 MED ORDER — PHENYLEPHRINE HCL-NACL 10-0.9 MG/250ML-% IV SOLN
0.0000 ug/min | INTRAVENOUS | Status: DC
Start: 2017-09-16 — End: 2017-09-18
  Filled 2017-09-16 (×2): qty 250

## 2017-09-16 MED ORDER — PROPOFOL 1000 MG/100ML IV EMUL
0.0000 ug/kg/min | INTRAVENOUS | Status: DC
  Administered 2017-09-16 – 2017-09-17 (×2): 35 ug/kg/min via INTRAVENOUS
  Filled 2017-09-16 (×4): qty 100

## 2017-09-16 MED ORDER — POTASSIUM CHLORIDE 20 MEQ/15ML (10%) PO SOLN
40.0000 meq | Freq: Once | ORAL | Status: AC
  Administered 2017-09-16: 40 meq
  Filled 2017-09-16: qty 30

## 2017-09-16 MED ORDER — METHYLPREDNISOLONE SODIUM SUCC 40 MG IJ SOLR
40.0000 mg | Freq: Four times a day (QID) | INTRAMUSCULAR | Status: DC
  Administered 2017-09-16 – 2017-09-18 (×8): 40 mg via INTRAVENOUS
  Filled 2017-09-16 (×8): qty 1

## 2017-09-16 MED ORDER — SODIUM CHLORIDE 0.9 % IV BOLUS
1000.0000 mL | Freq: Once | INTRAVENOUS | Status: AC
  Administered 2017-09-16: 1000 mL via INTRAVENOUS

## 2017-09-16 MED ORDER — IPRATROPIUM-ALBUTEROL 0.5-2.5 (3) MG/3ML IN SOLN
3.0000 mL | RESPIRATORY_TRACT | Status: DC
  Administered 2017-09-16 – 2017-09-25 (×55): 3 mL via RESPIRATORY_TRACT
  Filled 2017-09-16 (×17): qty 3
  Filled 2017-09-16: qty 39
  Filled 2017-09-16 (×25): qty 3
  Filled 2017-09-16: qty 6
  Filled 2017-09-16 (×13): qty 3

## 2017-09-16 MED ORDER — FENTANYL BOLUS VIA INFUSION
50.0000 ug | INTRAVENOUS | Status: DC | PRN
  Filled 2017-09-16: qty 50

## 2017-09-16 MED ORDER — MIDAZOLAM HCL 2 MG/2ML IJ SOLN
2.0000 mg | INTRAMUSCULAR | Status: DC | PRN
  Administered 2017-09-19 – 2017-09-23 (×3): 2 mg via INTRAVENOUS
  Filled 2017-09-16 (×3): qty 2

## 2017-09-16 MED ORDER — INSULIN ASPART 100 UNIT/ML ~~LOC~~ SOLN
0.0000 [IU] | SUBCUTANEOUS | Status: DC
  Administered 2017-09-16 – 2017-09-17 (×5): 4 [IU] via SUBCUTANEOUS
  Administered 2017-09-17: 7 [IU] via SUBCUTANEOUS
  Administered 2017-09-17 (×2): 4 [IU] via SUBCUTANEOUS
  Administered 2017-09-18 (×2): 3 [IU] via SUBCUTANEOUS
  Administered 2017-09-18 (×2): 4 [IU] via SUBCUTANEOUS
  Administered 2017-09-19 – 2017-09-20 (×6): 3 [IU] via SUBCUTANEOUS
  Administered 2017-09-20 – 2017-09-21 (×6): 4 [IU] via SUBCUTANEOUS
  Administered 2017-09-21 – 2017-09-22 (×6): 3 [IU] via SUBCUTANEOUS
  Administered 2017-09-22: 4 [IU] via SUBCUTANEOUS
  Administered 2017-09-23: 3 [IU] via SUBCUTANEOUS
  Administered 2017-09-23: 4 [IU] via SUBCUTANEOUS
  Administered 2017-09-23 – 2017-09-24 (×2): 7 [IU] via SUBCUTANEOUS
  Administered 2017-09-24 – 2017-09-25 (×5): 3 [IU] via SUBCUTANEOUS

## 2017-09-16 MED ORDER — IOPAMIDOL (ISOVUE-370) INJECTION 76%
INTRAVENOUS | Status: AC
  Filled 2017-09-16: qty 100

## 2017-09-16 NOTE — Progress Notes (Addendum)
eLink Physician-Brief Progress Note Patient Name: Danali Giggey DOB: May 08, 1970 MRN: [id number]   Date of Service  09/16/2017  HPI/Events of Note  Hypotension - BP = 79//50 with MAP = 60. LVEF = 55% to 60%.  eICU Interventions  Will order: 1. Bolus with 0.9 NaCl 1 liter IV over 1 hour now. 2. Phenylephrine IV infusion. Titrate to MAP > 65. 3. ABG STAT. 4. CBC STAT/     Intervention Category Major Interventions: Hypotension - evaluation and management  Rhiley Solem Eugene 09/16/2017, 4:51 AM

## 2017-09-16 NOTE — Progress Notes (Signed)
eLink Physician-Brief Progress Note Patient Name: Danielle Stevens DOB: 1971-02-08 MRN: [id number]   Date of Service  09/16/2017  HPI/Events of Note  Oliguria - Bladder scan with > 900 mL residual.   eICU Interventions  Will order: 1. I/O cath PRN.      Intervention Category Intermediate Interventions: Oliguria - evaluation and management  Bryah Ocheltree Eugene 09/16/2017, 5:13 AM

## 2017-09-16 NOTE — Progress Notes (Signed)
PULMONARY / CRITICAL CARE MEDICINE   Name: Danielle Stevens MRN: [id number] DOB: [birth year]    ADMISSION DATE:  08/22/2017  REFERRING MD:  Dr. Pearlean Brownie, ER  CHIEF COMPLAINT:  Short of breath  HISTORY OF PRESENT ILLNESS:   47 yo female former smoker with A1AT and severe emphysema s/p trach and chronic vent on weekly prolastin transferred from [facility] with altered mental status and hypercapnia.  Found to have MRSE bacteremia with concern for port infection.  PMHx of DM, HTN, Anxiety/depression.  SUBJECTIVE:  Continues to have intermittent increased peak airway pressures.  Feels anxious and has trouble getting air in.  VITAL SIGNS: BP 111/75 (BP Location: Right Arm)   Pulse (!) 102   Temp 98.8 F (37.1 C) (Oral)   Resp (!) 26   Ht 5\' 4"  (1.626 m)   Wt 179 lb 0.2 oz (81.2 kg)   LMP  (LMP Unknown)   SpO2 97%   BMI 30.73 kg/m   VENTILATOR SETTINGS: Vent Mode: PRVC FiO2 (%):  [50 %] 50 % Set Rate:  [12 bmp-20 bmp] 20 bmp Vt Set:  [380 mL] 380 mL PEEP:  [5 cmH20] 5 cmH20 Plateau Pressure:  [20 cmH20-31 cmH20] 21 cmH20  INTAKE / OUTPUT: I/O last 3 completed shifts: In: 4702.1 [I.V.:780.1; NG/GT:3840; IV Piggyback:82] Out: 2130 [Urine:2125; Drains:5]  PHYSICAL EXAMINATION:  General - anxious, using accessory muscles Eyes - pupils reactive ENT - trach site clean Cardiac - regular, tachycardic, no murmur Chest - decreased BS, prolonged exhalation, no wheeze Abd - soft, non tender, PEG site clean Ext - no edema Skin - no rashes Neuro - follows commands  LABS:  BMET Recent Labs  Lab 09/13/17 0500 09/14/17 0526 09/16/17 0406  NA 147* 144 143  K 3.8 3.6 3.2*  CL 97* 95* 95*  CO2 43* 38* 41*  BUN 14 14 13   CREATININE 0.53 0.47 0.50  GLUCOSE 89 56* 160*    Electrolytes Recent Labs  Lab 09/11/17 1230  09/13/17 0500 09/14/17 0526 09/16/17 0406  CALCIUM 9.1   < > 9.4 9.6 9.3  MG 1.9  --   --   --   --    < > = values in this interval not displayed.     CBC Recent Labs  Lab 09/12/17 0517 09/13/17 0500 09/16/17 0406  WBC 4.9 4.8 6.0  HGB 9.4* 9.4* 9.4*  HCT 32.7* 32.7* 33.1*  PLT 118* 123* 154    Coag's No results for input(s): APTT, INR in the last 168 hours.  Sepsis Markers No results for input(s): LATICACIDVEN, PROCALCITON, O2SATVEN in the last 168 hours.  ABG Recent Labs  Lab 09/16/17 0630  PHART 7.352  PCO2ART 74.4*  PO2ART 83.9    Liver Enzymes Recent Labs  Lab 09/11/17 0145 09/13/17 0500 09/16/17 0406  AST 20 19 18   ALT 18 17 16   ALKPHOS 61 50 50  BILITOT 0.6 0.7 0.6  ALBUMIN 2.7* 3.1* 2.9*    Cardiac Enzymes No results for input(s): TROPONINI, PROBNP in the last 168 hours.  Glucose Recent Labs  Lab 09/15/17 0802 09/15/17 1147 09/15/17 1445 09/15/17 1929 09/15/17 2206 09/16/17 0802  GLUCAP 167* 133* 123* 153* 156* 84    Imaging Dg Chest Port 1 View  Result Date: 09/16/2017 CLINICAL DATA:  Chronic respiratory failure, shortness of breath. EXAM: PORTABLE CHEST 1 VIEW COMPARISON:  Radiograph of Sep 15, 2017. FINDINGS: Stable cardiomediastinal silhouette. Tracheostomy tube is unchanged in position. Left-sided PICC line is unchanged in position. Large emphysematous bulla is  noted in left upper lobe. Blunting of left costophrenic sulcus is noted most consistent with scarring. Stable interstitial densities are noted in the right lung concerning for scarring or possibly edema. Bony thorax is unremarkable. IMPRESSION: Stable emphysematous disease. Stable support apparatus. Stable diffuse right lung scarring or edema. Emphysema (ICD10-J43.9). Electronically Signed   By: Lupita Raider, M.D.   On: 09/16/2017 07:21     STUDIES:  Echo 4/29 >> EF 55 to 60%  CULTURES: Blood 4/26 >> MRSE  ANTIBIOTICS: Vancomycin 4/27 >> 5/15  C diff 5/01 >> Ag positive, toxin negative, PCR negative Blood 5/01 >> negative  SIGNIFICANT EVENTS: 4/26 Transfer from [facility] 4/29 ID, CCS consulted 4/30 removal of Rt  [place] port 5/01 CCS s/o 5/09 Transfuse 1 unit PRBC  LINES/TUBES: Trach >>  Lt PICC 5/06 >>   DISCUSSION: She has increased air hunger, anxiety, respiratory distress 5/21.    ASSESSMENT / PLAN:  Acute on chronic hypoxia/hypercapnic respiratory failure. 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 - worsening respiratory status 5/21 - vent settings adjusted - check CT angio chest - add solumedrol - BDs adjusted - weekly prolastin  MRSE from infected port. - completed ABx 5/15  Anxiety with depression. - change sedation to diprivan/fentanyl gtt with goal RASS 0 to -1 - continue buspar, klonopin, doxepin, paxil, seroquel  DM type II. - SSI with lantus  Anemia of critical illness and chronic disease. - f/u CBC  Hypokalemia. - replace as needed  DVT prophylaxis - lovenox SUP - pepcid Nutrition - tube feeds Goals of care - full code  CC time 36 minutes  Coralyn Helling, MD [facility] Pulmonary/Critical Care 09/16/2017, 9:54 AM

## 2017-09-16 NOTE — Progress Notes (Signed)
CRITICAL VALUE ALERT  Critical Value:  ABG: pCO2 74.4  Date & Time Notied:  09/16/17 0648  Provider Notified: E link  Orders Received/Actions taken: awaiting orders

## 2017-09-16 NOTE — Progress Notes (Signed)
Physical Therapy Treatment Patient Details Name: Danielle Stevens MRN: [id number] DOB: 07-15-1970 Today's Date: 09/16/2017    History of Present Illness Pt is a 47 y.o. female with alpha-1 antitrypsin deficiency and has ventilator dependent respiratory failure, admitted from [facility] on 08/22/17 due to sudden unresponsiveness. Found to have worsening acidosis in setting of mucous plug from purulent bronchitis; also with MRSE bacteremia and hypotension. PMH includes anxiety, asthma, COPD, DM, emphysema lung, HTN. Of note, recently admitted 05/16/17-06/03/17 for COPD exacerbation requiring intubation; tracheostomy done later with d/c to SNF [facility].     PT Comments    Pt limited in ability to participate in therapy today due to continuous diarrhea. Pt able to assist in rolling for pericare, requiring modAx2 for rolling R and L. Pt cleaned and NSG notified of ongoing bowel movement. PT will continue to follow acutely when pt is appropriate.    Follow Up Recommendations  SNF     Equipment Recommendations  None recommended by PT    Recommendations for Other Services       Precautions / Restrictions Precautions Precautions: Fall;Other (comment) Precaution Comments: trach/vent, peg tube, flexiseal, high anxiety Restrictions Weight Bearing Restrictions: No    Mobility  Bed Mobility Overal bed mobility: Needs Assistance Bed Mobility: Rolling Rolling: Mod assist;+2 for physical assistance         General bed mobility comments: mod assist x 2 for rolling for pericare, pt with continuous stooling limiting his ability to sit EoB   Transfers                    Ambulation/Gait             General Gait Details: unable            Cognition Arousal/Alertness: Lethargic;Suspect due to medications Behavior During Therapy: [facility] for tasks assessed/performed;Anxious Overall Cognitive Status: Difficult to assess                                 General  Comments: Pt nodding yes/no and mouthing responses to questions appropriately. Follows commands         General Comments        Pertinent Vitals/Pain Pain Assessment: Faces Faces Pain Scale: Hurts even more Pain Location: R IV site  Pain Descriptors / Indicators: Sore;Throbbing Pain Intervention(s): Limited activity within patient's tolerance;Monitored during session;Repositioned           PT Goals (current goals can now be found in the care plan section) Acute Rehab PT Goals Patient Stated Goal: not stated PT Goal Formulation: With patient Time For Goal Achievement: 09/23/17 Potential to Achieve Goals: Fair    Frequency    Min 2X/week      PT Plan Current plan remains appropriate    Co-evaluation              AM-PAC PT "6 Clicks" Daily Activity  Outcome Measure  Difficulty turning over in bed (including adjusting bedclothes, sheets and blankets)?: Unable Difficulty moving from lying on back to sitting on the side of the bed? : Unable Difficulty sitting down on and standing up from a chair with arms (e.g., wheelchair, bedside commode, etc,.)?: Unable Help needed moving to and from a bed to chair (including a wheelchair)?: Total Help needed walking in hospital room?: Total Help needed climbing 3-5 steps with a railing? : Total 6 Click Score: 6    End of Session Equipment Utilized During  Treatment: Oxygen(vent with trach) Activity Tolerance: Treatment limited secondary to medical complications (Comment) Patient left: in chair;with call bell/phone within reach;with chair alarm set Nurse Communication: Mobility status;Need for lift equipment PT Visit Diagnosis: Other abnormalities of gait and mobility (R26.89);Muscle weakness (generalized) (M62.81)     Time: 4010-2725 PT Time Calculation (min) (ACUTE ONLY): 12 min  Charges:  $Therapeutic Activity: 8-22 mins                    G Codes:       Kobyn Kray B. Beverely Risen PT, DPT Acute Rehabilitation  [phone] Pager [phone]     Elon Alas [facility] 09/16/2017, 3:44 PM

## 2017-09-16 NOTE — Progress Notes (Signed)
[place] TEAM 1 - Stepdown/ICU TEAM  Danielle Stevens  ZMO:[id number] DOB: 1970/07/14 DOA: 08/22/2017 PCP: Patient, No Pcp Per    Brief Narrative:  41 year oldfemale LTACH resident w/ a hx of alpha-1 antitrypsin deficiency and chronic ventilator dependent respiratory failure who presented unresponsive due to acute hypercapnic respiratory failure due to mucous plugging.  By the time she arrived in the ED EMS had actually essentially corrected her acute resp decline.      Significant Events: 4/26 admit 4/30 Port-a-cath removed in OR 5/7 PICC placed   Subjective: Chart reviewed.  Events of last night noted.  Spoke w/ Dr. Halford Chessman who agrees that primary issues are Pulmonary in nature.  Dr. Halford Chessman has agreed to take pt on his service.  TRH will sign off.    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 Port removed 4/30 - continued IV antibiotic therapy through 09/10/17 as per ID suggestions - has a single lumen PICC as of 5/6 - now off abx tx - no clinical sign of active infection presently   Acute on chronic hypercapnic respiratory failure - COPD + A1 AT deficiency / advanced lung disease - chronic tracheostomy  [facility] per PCCM - [facility] for possible long term wean attempts but presently not stable for d/c due to intermittent episodes of acute resp distress - on once weekly Prolastin for alpha 1 antitrypsin deficiency  DM2  Chronic multifactorial normocytic anemia Transfused 1U PRBC 5/9   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  Depression  Hypernatremia  Corrected w/ increased free water   DVT prophylaxis: lovenox  Code Status: FULL CODE Family Communication:  Disposition Plan:  Consultants:  PCCM ID Gen Surgery   Antimicrobials:  Levofloxacin 4/26 > 4/30 Vancomycin 4/27 > 5/15  Objective: Blood pressure 111/75, pulse (!) 102, temperature 98.8 F (37.1 C), temperature source Oral, resp. rate (!) 26, height 5' 4"  (1.626 m), weight 81.2 kg (179 lb 0.2 oz), SpO2 97 %.  Intake/Output Summary (Last 24 hours) at 09/16/2017 0943 Last data filed at 09/16/2017 0900 Gross per 24 hour  Intake 2997.92 ml  Output 1750 ml  Net 1247.92 ml   Filed Weights   09/14/17 0500 09/15/17 0503 09/16/17 0401  Weight: 80.1 kg (176 lb 9.4 oz) 79.6 kg (175 lb 7.8 oz) 81.2 kg (179 lb 0.2 oz)    Examination: No exam today - PCCM assuming care  CBC: Recent Labs  Lab 09/12/17 0517 09/13/17 0500 09/16/17 0406  WBC 4.9 4.8 6.0  HGB 9.4* 9.4* 9.4*  HCT 32.7* 32.7* 33.1*  MCV 86.5 87.9 88.5  PLT 118* 123* 035   Basic Metabolic Panel: Recent Labs  Lab 09/11/17 1230  09/13/17 0500 09/14/17 0526 09/16/17 0406  NA 141   < > 147* 144 143  K 4.0   < > 3.8 3.6 3.2*  CL 98*   < > 97* 95* 95*  CO2 35*   < > 43* 38* 41*  GLUCOSE 130*   < > 89 56* 160*  BUN 9   < > _0 CREATININE 0.48   < > 0.53 0.47 0.50  CALCIUM 9.1   < > 9.4 9.6 9.3  MG 1.9  --   --   --   --    < > = values in this interval not displayed.   GFR: Estimated Creatinine Clearance: 88.7 mL/min (by C-G formula based on SCr of 0.5 mg/dL).  Liver Function Tests:  Recent Labs  Lab 09/10/17 0515 09/11/17 0145 09/13/17 0500 09/16/17 0406  AST _0 ALT _1 ALKPHOS 65 61 50 50  BILITOT 0.5 0.6 0.7 0.6  PROT 5.8* 5.7* 5.8* 5.5*  ALBUMIN 2.7* 2.7* 3.1* 2.9*    CBG: Recent Labs  Lab 09/15/17 1147 09/15/17 1445 09/15/17 1929 09/15/17 2206 09/16/17 0802  GLUCAP 133* 123* 153* 156* 84    Scheduled Meds: . acidophilus  1 capsule Oral Daily  . budesonide (PULMICORT) nebulizer solution  0.5 mg Nebulization BID  . busPIRone  15 mg Per Tube BID  . chlorhexidine gluconate (MEDLINE KIT)  15 mL Mouth Rinse BID  . Chlorhexidine Gluconate Cloth  6 each Topical Daily  . clonazepam  1 mg Per Tube TID  . doxepin  10 mg Per Tube QHS  . enoxaparin (LOVENOX) injection  40 mg Subcutaneous Q24H  . famotidine  20 mg Per Tube BID  .  free water  300 mL Per Tube Q4H  . insulin glargine  14 Units Subcutaneous QHS  . ipratropium-albuterol  3 mL Nebulization Q4H  . mouth rinse  15 mL Mouth Rinse QID  . methylPREDNISolone (SOLU-MEDROL) injection  40 mg Intravenous Q6H  . midodrine  5 mg Per Tube TID WC  . multivitamin  15 mL Per Tube Daily  . PARoxetine  30 mg Per Tube Daily  . polyethylene glycol  17 g Per Tube Daily  . QUEtiapine  50 mg Per Tube BID  . sodium chloride flush  10-40 mL Intracatheter Q12H     LOS: 25 days   Cherene Altes, MD [facility] Office  [phone] Pager - Text Page per Amion as per below:  On-Call/Text Page:      [email or web address]      password [email or web address]  If 7PM-7AM, please contact night-coverage [email or web address] Password [email or web address] 09/16/2017, 9:43 AM

## 2017-09-16 NOTE — Plan of Care (Signed)
  Problem: Clinical Measurements: Goal: Ability to maintain clinical measurements within normal limits will improve Outcome: Progressing Goal: Will remain free from infection Outcome: Progressing Goal: Respiratory complications will improve Outcome: Progressing Note:  Trach occlusion overnight, improved at this time.  Goal: Cardiovascular complication will be avoided Outcome: Progressing   Problem: Activity: Goal: Risk for activity intolerance will decrease Outcome: Progressing Note:  Patient assists with turns, refuses PT at times.   Problem: Skin Integrity: Goal: Risk for impaired skin integrity will decrease Outcome: Progressing Note:  Q2 hr turns in bed, heels floated, low air loss mattress, moisture management for incontinence, purewick in place.   Problem: Respiratory: Goal: Ability to maintain a clear airway and adequate ventilation will improve Outcome: Progressing   Problem: Coping: Goal: Level of anxiety will decrease Outcome: Not Progressing Note:  Increased anxiety, medication given per orders, precedex titrated up, PRN nebulizer treatments given for shortness of breath.

## 2017-09-16 NOTE — Procedures (Signed)
Bedside Bronchoscopy Procedure Note Danielle Stevens [id number] [birth year]  Procedure: Bronchoscopy Indications: Diagnostic evaluation of the airways  Procedure Details: Trach #6 [place]iley XLT, chronic ventilatory support via mechanical ventilation.  In preparation for procedure, Patient hyper-oxygenated with 100 % FiO2 Airway entered and the following bronchi were examined: Bronchi. Tracheal Airway Bronchoscope removed.  , Patient placed back on 100% FiO2 at conclusion of procedure.    Evaluation BP 100/64 (BP Location: Right Arm)   Pulse (!) 103   Temp 98.2 F (36.8 C) (Oral)   Resp (!) 24   Ht 5\' 4"  (1.626 m)   Wt 175 lb 7.8 oz (79.6 kg)   LMP  (LMP Unknown)   SpO2 100%   BMI 30.12 kg/m  Breath Sounds: Tight, decrease aeration throughout. Poor Air Exchange O2 sats: stable throughout Patient's Current Condition: stable Specimens:  None Complications: No apparent complications Patient did tolerate procedure well.  Bedside Bronchoscopy performed for the evaluation of possible Tracheal Airway obstruction/occulsion. Refer to Previous RT note. Patient was preoxygenated w/ 100% FiO2. Bronchoscopy performed per Nehemiah Settle, NP.  Appears to look like tissue or something similar at the distal end of the trach flange.   Outcome:  Plan is to remove and replace Trach per MD Arsenio Loader, once [facility] attending is available and at bedside. No apparent complications noted, pt was stable throughout procedure. RRT at bedside throughout Bronchoscopy.  Benjamine Sprague, B.S, RRT, RCP 09/16/2017, 12:37 AM

## 2017-09-16 NOTE — Progress Notes (Signed)
Called to bedside reference ongoing high PIP in the 50's and unable to pass ballard.  See RTs note from 1035.  Concern over obstruction.  Patient with good oxygen saturations and receiving TVs.  Breath sounds severely diminished anteriorly.  Unable to pass with ballard on my attempt.  Bronchoscope inserted in trach to further assess.  End of trach had Navia/opaque appearing substance at the end of trach, unsure of what this was, while discussing with Dr. Madalyn Rob on [facility].  Bronchoscope removed.  Attempted to perform trach change, given this is not a new trach.  Unsure of last trach change.  However met resistance while attempting to remove, and therefore procedure aborted.  After this, PIP returned to her baseline in the 30's and was able to pass ballard.   Will need evaluation by day team to further determine trach issues.  At this time, patient is stable with satisfactory oxygenation and ventilation.  CXR pending.   Kennieth Rad, AGACNP-BC [place] [facility] Pgr: [phone] or if no answer [phone] 09/16/2017, 1:04 AM

## 2017-09-17 ENCOUNTER — Inpatient Hospital Stay (HOSPITAL_COMMUNITY): Payer: Medicare Other

## 2017-09-17 LAB — BLOOD GAS, ARTERIAL
ACID-BASE EXCESS: 12.1 mmol/L — AB (ref 0.0–2.0)
Bicarbonate: 36.4 mmol/L — ABNORMAL HIGH (ref 20.0–28.0)
DRAWN BY: 44166
FIO2: 50
O2 SAT: 99.5 %
PATIENT TEMPERATURE: 98.6
PCO2 ART: 49.6 mmHg — AB (ref 32.0–48.0)
PEEP/CPAP: 8 cmH2O
PH ART: 7.479 — AB (ref 7.350–7.450)
RATE: 20 resp/min
VT: 480 mL
pO2, Arterial: 147 mmHg — ABNORMAL HIGH (ref 83.0–108.0)

## 2017-09-17 LAB — COMPREHENSIVE METABOLIC PANEL
ALBUMIN: 3.2 g/dL — AB (ref 3.5–5.0)
ALT: 19 U/L (ref 14–54)
AST: 24 U/L (ref 15–41)
Alkaline Phosphatase: 65 U/L (ref 38–126)
Anion gap: 12 (ref 5–15)
BUN: 13 mg/dL (ref 6–20)
CHLORIDE: 94 mmol/L — AB (ref 101–111)
CO2: 33 mmol/L — AB (ref 22–32)
Calcium: 9.5 mg/dL (ref 8.9–10.3)
Creatinine, Ser: 0.53 mg/dL (ref 0.44–1.00)
GFR calc Af Amer: >60 mL/min (ref >60)
GFR calc non Af Amer: >60 mL/min (ref >60)
Glucose, Bld: 217 mg/dL — ABNORMAL HIGH (ref 65–99)
POTASSIUM: 3.7 mmol/L (ref 3.5–5.1)
SODIUM: 139 mmol/L (ref 135–145)
Total Bilirubin: 0.6 mg/dL (ref 0.3–1.2)
Total Protein: 5.9 g/dL — ABNORMAL LOW (ref 6.5–8.1)

## 2017-09-17 LAB — CBC
HCT: 34.1 % — ABNORMAL LOW (ref 36.0–46.0)
Hemoglobin: 10 g/dL — ABNORMAL LOW (ref 12.0–15.0)
MCH: 25.1 pg — ABNORMAL LOW (ref 26.0–34.0)
MCHC: 29.3 g/dL — ABNORMAL LOW (ref 30.0–36.0)
MCV: 85.5 fL (ref 78.0–100.0)
PLATELETS: 165 10*3/uL (ref 150–400)
RBC: 3.99 MIL/uL (ref 3.87–5.11)
RDW: 16.8 % — AB (ref 11.5–15.5)
WBC: 6.3 10*3/uL (ref 4.0–10.5)

## 2017-09-17 LAB — GLUCOSE, CAPILLARY
GLUCOSE-CAPILLARY: 166 mg/dL — AB (ref 65–99)
GLUCOSE-CAPILLARY: 217 mg/dL — AB (ref 65–99)
Glucose-Capillary: 187 mg/dL — ABNORMAL HIGH (ref 65–99)
Glucose-Capillary: 178 mg/dL — ABNORMAL HIGH (ref 65–99)
Glucose-Capillary: 166 mg/dL — ABNORMAL HIGH (ref 65–99)
Glucose-Capillary: 117 mg/dL — ABNORMAL HIGH (ref 65–99)

## 2017-09-17 LAB — MAGNESIUM: Magnesium: 1.7 mg/dL (ref 1.7–2.4)

## 2017-09-17 MED ORDER — QUETIAPINE FUMARATE 100 MG PO TABS
100.0000 mg | ORAL_TABLET | Freq: Two times a day (BID) | ORAL | Status: DC
  Administered 2017-09-17 – 2017-09-25 (×16): 100 mg
  Filled 2017-09-17 (×16): qty 1

## 2017-09-17 NOTE — Progress Notes (Signed)
Discussed at length with transplant attending MD at [facility] regarding potential tx and/or transplant w/u.  After lengthy discussion and fairly in depth review he felt that pt would not be candidate for transplant w/u at this time based on poor functional status/deconditioning as well as BMI.  Their transplant criteria includes the ability to ambulate at least 1032ft and BMI <28.  He did feel that her BMI may be inacurate reflection given POS fluid status and prolonged illness but felt that her deconditioning and inability to ambulate at this time precludes her from consideration at this time. He did want to be able to help her and said that if her acute illness improves and she is able to improve her rehab status that [facility] may be able to consider her in the future.    Dirk Dress, NP 09/17/2017  11:33 AM Pager: [phone] or [phone]

## 2017-09-17 NOTE — Progress Notes (Signed)
Also spoke today with Dr. Dudley Major from [facility] transplant center who also feels that pt is not transplant candidate at this time due to significant deconditioning, non ambulatory.  If she is able to work on rehab they would be happy to consider her in the future.  Minimum requirement is ambulating ~1000 ft in 6 mins.   Dirk Dress, NP 09/17/2017  4:23 PM Pager: [phone] or [phone]

## 2017-09-17 NOTE — Progress Notes (Signed)
Doing routine vent check and assessment. Unable to pass suction catheter, recurrent event from previous night. Obstruction of patient trach flange. Refer to Previous RRT Note.   Pt is stable at this time. PIP are elevated in the upper 40's and low 50's. 47-54cmh20. CCM MD made aware.

## 2017-09-17 NOTE — Plan of Care (Signed)
  Problem: Clinical Measurements: Goal: Ability to maintain clinical measurements within normal limits will improve Outcome: Progressing Goal: Respiratory complications will improve Outcome: Progressing Note:  Denies shortness of breath, trach occluded and suction catheter unable to pass. MD aware.    Problem: Activity: Goal: Risk for activity intolerance will decrease Outcome: Progressing Note:  PT consult   Problem: Coping: Goal: Level of anxiety will decrease Outcome: Progressing Note:  Anxiety improved with fentanyl gtt and propofol gtt, patient is alert and calm.    Problem: Elimination: Goal: Will not experience complications related to bowel motility Outcome: Not Progressing Note:  Multiple loose stools in the last 24 hours

## 2017-09-17 NOTE — Progress Notes (Signed)
Nutrition Follow-up  DOCUMENTATION CODES:   Not applicable  INTERVENTION:   Tube Feeding:  Continue Vital AF 1.2 @ 60 ml/hr If unable to wean off diprivan, will reassess TF   NUTRITION DIAGNOSIS:   Inadequate oral intake related to inability to eat(Chronic resp failure, PEG dependant) as evidenced by NPO status.  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  At [facility] earlier today she was suddenly unresponsive and EMS was called.  Pt remains on vent support via trach; noted plan to place call to [facility] to assess for transplant consideration; pt with hx of A1AT deficiency  Propofol: 12.2 ml/hr, trying to wean off  Vital AF 1.2 @ 60 ml/hr Diarrhea had improved with change in formula; pt only having 0-2 BMs per day; within last 24 hours, pt had 5 liquid BMs and complains of abdominal pain. TF on hold since 9:30AM per pt request for abdominal pain and fullness. No nausea or vomiting. Plan to resume later  Labs: reviewed Meds: acidophilus, ss novolog, lantus, liquid MVI, diprivan   Diet Order:   Diet Order    None      EDUCATION NEEDS:   No education needs have been identified at this time  Skin:  Skin Assessment: Skin Integrity Issues: Skin Integrity Issues:: Other (Comment) Other: MASD: buttock, groin  Last BM:  5/22  Height:   Ht Readings from Last 1 Encounters:  08/22/17 '5\' 4"'$  (1.626 m)    Weight:   Wt Readings from Last 1 Encounters:  09/17/17 183 lb 6.8 oz (83.2 kg)    Ideal Body Weight:  54.55 kg  BMI:  Body mass index is 31.48 kg/m.  Estimated Nutritional Needs:   Kcal:  1650-1800 kcals (23-25 kcal/kg  bw)  Protein:  87-100 grams (1.2-1.4 grams/kg)  Fluid:  >1.8 L fluid   Kerman Passey MS, RD, LDN, CNSC [phone] Pager  [phone] Weekend/On-Call Pager'

## 2017-09-17 NOTE — Plan of Care (Signed)
Remains on full ventilator support. Now on Fentanyl and Propofol since my last note. Attempts to wean down on propofol today have been successful so far; continuing to wean.

## 2017-09-17 NOTE — Progress Notes (Signed)
PULMONARY / CRITICAL CARE MEDICINE   Name: Harlean Regula MRN: [id number] DOB: 1970/08/16    ADMISSION DATE:  08/22/2017  REFERRING MD:  Dr. Pearlean Brownie, ER  CHIEF COMPLAINT:  Short of breath  HISTORY OF PRESENT ILLNESS:   47 yo female former smoker with A1AT and severe emphysema s/p trach and chronic vent on weekly prolastin transferred from [facility] with altered mental status and hypercapnia.  Found to have MRSE bacteremia with concern for port infection.  PMHx of DM, HTN, Anxiety/depression.  SUBJECTIVE:  C/o upper abdominal discomfort.  Breathing okay.  Remains on diprivan/fentanyl gtt.  VITAL SIGNS: BP 120/70 (BP Location: Left Arm)   Pulse (!) 101   Temp 98.2 F (36.8 C) (Oral)   Resp 17   Ht 5\' 4"  (1.626 m)   Wt 183 lb 6.8 oz (83.2 kg)   LMP  (LMP Unknown)   SpO2 99%   BMI 31.48 kg/m   VENTILATOR SETTINGS: Vent Mode: PRVC FiO2 (%):  [50 %] 50 % Set Rate:  [20 bmp] 20 bmp Vt Set:  [480 mL] 480 mL PEEP:  [8 cmH20] 8 cmH20 Plateau Pressure:  [17 cmH20-40 cmH20] 17 cmH20  INTAKE / OUTPUT: I/O last 3 completed shifts: In: 4681.9 [I.V.:639.9; NG/GT:3960; IV Piggyback:82] Out: 1481 [Urine:1480; Stool:1]  PHYSICAL EXAMINATION:  General - calm Eyes - pupils reactive ENT - trach site clean Cardiac - regular, no murmur Chest - decreased BS, no wheeze Abd - soft, mild RUQ tenderness, no guarding/rebound, + bowel sounds, G tube site clean Ext - no edema Skin - no rashes Neuro - RASS 0  LABS:  BMET Recent Labs  Lab 09/14/17 0526 09/16/17 0406 09/17/17 0410  NA 144 143 139  K 3.6 3.2* 3.7  CL 95* 95* 94*  CO2 38* 41* 33*  BUN 14 13 13   CREATININE 0.47 0.50 0.53  GLUCOSE 56* 160* 217*    Electrolytes Recent Labs  Lab 09/11/17 1230  09/14/17 0526 09/16/17 0406 09/17/17 0410  CALCIUM 9.1   < > 9.6 9.3 9.5  MG 1.9  --   --   --  1.7   < > = values in this interval not displayed.    CBC Recent Labs  Lab 09/13/17 0500 09/16/17 0406 09/17/17 0410   WBC 4.8 6.0 6.3  HGB 9.4* 9.4* 10.0*  HCT 32.7* 33.1* 34.1*  PLT 123* 154 165    Coag's No results for input(s): APTT, INR in the last 168 hours.  Sepsis Markers No results for input(s): LATICACIDVEN, PROCALCITON, O2SATVEN in the last 168 hours.  ABG Recent Labs  Lab 09/16/17 0630 09/17/17 0330  PHART 7.352 7.479*  PCO2ART 74.4* 49.6*  PO2ART 83.9 147*    Liver Enzymes Recent Labs  Lab 09/13/17 0500 09/16/17 0406 09/17/17 0410  AST 19 18 24   ALT 17 16 19   ALKPHOS 50 50 65  BILITOT 0.7 0.6 0.6  ALBUMIN 3.1* 2.9* 3.2*    Cardiac Enzymes No results for input(s): TROPONINI, PROBNP in the last 168 hours.  Glucose Recent Labs  Lab 09/16/17 1148 09/16/17 1550 09/16/17 1956 09/16/17 2346 09/17/17 0412 09/17/17 0848  GLUCAP 103* 165* 165* 166* 217* 187*    Imaging Ct Angio Chest Pe W Or Wo Contrast  Result Date: 09/16/2017 CLINICAL DATA:  COPD, acute on chronic hypoxia, MRSA EXAM: CT ANGIOGRAPHY CHEST WITH CONTRAST TECHNIQUE: Multidetector CT imaging of the chest was performed using the standard protocol during bolus administration of intravenous contrast. Multiplanar CT image reconstructions and MIPs were obtained  to evaluate the vascular anatomy. CONTRAST:  40mL ISOVUE-370 IOPAMIDOL (ISOVUE-370) INJECTION 76% COMPARISON:  Chest radiograph dated 09/16/2017 FINDINGS: Cardiovascular: Satisfactory opacification of the pulmonary arteries to the lobar level. No evidence of pulmonary embolism. The heart is normal in size.  No pericardial effusion. No evidence of thoracic aortic aneurysm. Mild atherosclerotic calcifications of the aortic arch. Both are not of the main pulmonary artery, suggesting pulmonary arterial hypertension. Left arm PICC terminating at the cavoatrial junction. Mediastinum/Nodes: Small mediastinal lymph nodes which do not meet pathologic CT size criteria. Visualized thyroid is grossly unremarkable. Lungs/Pleura: Endotracheal tube terminates 3 cm above the  carina. Dominant bulla in the superior segment left lower lobe. Associated compressive atelectasis in the lingula. Moderate centrilobular and paraseptal emphysematous changes, upper lobe predominant. Mild patchy bilateral lower lobe opacities, likely atelectasis. Small bilateral pleural effusions. No pneumothorax. Upper Abdomen: Visualized upper abdomen notable for a percutaneous gastrostomy in satisfactory position. Musculoskeletal: Visualized osseous structures are within normal limits. Review of the MIP images confirms the above findings. IMPRESSION: No evidence of pulmonary embolism. Mild patchy bilateral lower lobe opacities, likely atelectasis. Small bilateral pleural effusions. Enlargement of the main pulmonary artery, suggesting pulmonary arterial hypertension. Aortic Atherosclerosis (ICD10-I70.0) and Emphysema (ICD10-J43.9). Electronically Signed   By: Charline Bills M.D.   On: 09/16/2017 11:29   Dg Chest Port 1 View  Result Date: 09/17/2017 CLINICAL DATA:  Respiratory failure EXAM: PORTABLE CHEST 1 VIEW COMPARISON:  09/16/2017 FINDINGS: Cardiac shadow is within normal limits. The lungs are again hyperinflated similar to that seen on recent CT examination. Some patchy changes are noted in the posterior aspect of the right lower lobe stable from the previous CT. Gastrostomy catheter is noted in place. No acute bony abnormality is seen. Tracheostomy tube is noted in satisfactory position. Left-sided PICC line is noted and stable. IMPRESSION: Stable COPD and right lower lobe infiltrate. Electronically Signed   By: Alcide Clever M.D.   On: 09/17/2017 06:59     STUDIES:  Echo 4/29 >> EF 55 to 60% CT angio chest 5/21 >> mild atherosclerosis, enlarged PA, centrilobular and paraseptal emphysema with dominant bulla in superior segment LLL, ATX, small b/l effusions  CULTURES: Blood 4/26 >> MRSE  ANTIBIOTICS: Vancomycin 4/27 >> 5/15  C diff 5/01 >> Ag positive, toxin negative, PCR negative Blood  5/01 >> negative  SIGNIFICANT EVENTS: 4/26 Transfer from [facility] 4/29 ID, CCS consulted 4/30 removal of Rt [place] port 5/01 CCS s/o 5/09 Transfuse 1 unit PRBC 5/21 Change sedation, start steroids  LINES/TUBES: Trach >>  Lt PICC 5/06 >>   DISCUSSION: Appears more comfortable.  FiO2 needs improved some.  Remains on full vent support.  Unclear whether she was ever assessed for lung transplant.  ASSESSMENT / PLAN:  Acute on chronic hypoxia/hypercapnic respiratory failure. 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 - continue full vent support - adjust PEEP, FiO2 to keep SpO2 88 to 95% - f/u CXR - continue solumedrol - continue BDs - prolastin on Fridays - will place call to [facility] to assess for transplant consideration  MRSE from infected port. - completed ABx 5/15  Anxiety with depression. - RASS goal 0 to -1 - continue diprivan, fentanyl gtt - continue klonopin 1 mg tid, doxepin 10 mg qhs, paroxetine 30 mg daily, buspar 15 mg bid - increase seroquel to 100 mg bid  DM type II. - SSI with lantus  Anemia of critical illness and chronic disease. - f/u CBC  Hypokalemia. - replace as needed  DVT prophylaxis -  lovenox SUP - pepcid Nutrition - tube feeds Goals of care - full code  CC time 32 minutes  Coralyn Helling, MD [facility] Pulmonary/Critical Care 09/17/2017, 8:56 AM

## 2017-09-17 NOTE — Progress Notes (Signed)
Called to the bedside per RN due to excessive vent alarming and RN stated that she is unable to pass suction catheter. Once at the bedside noted increase PIP in the low 50's. Pt is stable. Pt SATs are stable on 50% FiO2 and PEEP 8cmh20.

## 2017-09-17 NOTE — Progress Notes (Signed)
ABG collected  

## 2017-09-17 NOTE — Plan of Care (Signed)
  Problem: Health Behavior/Discharge Planning: Goal: Ability to manage health-related needs will improve Outcome: Progressing   Problem: Clinical Measurements: Goal: Ability to maintain clinical measurements within normal limits will improve 09/17/2017 2011 by Della Goo, RN Outcome: Progressing 09/17/2017 0626 by Della Goo, RN Outcome: Progressing Goal: Will remain free from infection Outcome: Progressing Goal: Diagnostic test results will improve Outcome: Progressing Goal: Respiratory complications will improve 09/17/2017 2011 by Della Goo, RN Outcome: Progressing Note:  Denies shortness of breath at this time, trach is positional 09/17/2017 0626 by Della Goo, RN Outcome: Progressing Note:  Denies shortness of breath, trach occluded and suction catheter unable to pass. MD aware.  Goal: Cardiovascular complication will be avoided Outcome: Progressing Note:  NSR, ST when anxious.   Problem: Activity: Goal: Risk for activity intolerance will decrease 09/17/2017 2011 by Della Goo, RN Outcome: Progressing Note:  PT consult 09/17/2017 0626 by Della Goo, RN Outcome: Progressing Note:  PT consult   Problem: Coping: Goal: Level of anxiety will decrease 09/17/2017 2011 by Della Goo, RN Outcome: Progressing Note:  Improved with medication, see MAR 09/17/2017 0626 by Della Goo, RN Outcome: Progressing Note:  Anxiety improved with fentanyl gtt and propofol gtt, patient is alert and calm.    Problem: Elimination: Goal: Will not experience complications related to bowel motility 09/17/2017 2011 by Della Goo, RN Outcome: Progressing Note:  Frequent loose stools, frequency decreased at this time. 09/17/2017 0626 by Della Goo, RN Outcome: Not Progressing Note:  Multiple loose stools in the last 24 hours   Problem: Safety: Goal: Ability to remain free from injury will  improve Outcome: Progressing   Problem: Skin Integrity: Goal: Risk for impaired skin integrity will decrease Outcome: Progressing Note:  Q2 hr turns, refuses mobility at times, MASD present d/t frequent loose stools/incontinence, barrier cream applied.   Problem: Respiratory: Goal: Ability to maintain a clear airway and adequate ventilation will improve Outcome: Progressing Note:  Trach positional, no apparent distress at this time, denies shortness of breath.

## 2017-09-18 ENCOUNTER — Inpatient Hospital Stay (HOSPITAL_COMMUNITY): Payer: Medicare Other

## 2017-09-18 LAB — CBC
HEMATOCRIT: 34.6 % — AB (ref 36.0–46.0)
HEMOGLOBIN: 10.1 g/dL — AB (ref 12.0–15.0)
MCH: 25.3 pg — ABNORMAL LOW (ref 26.0–34.0)
MCHC: 29.2 g/dL — AB (ref 30.0–36.0)
MCV: 86.5 fL (ref 78.0–100.0)
Platelets: 242 10*3/uL (ref 150–400)
RBC: 4 MIL/uL (ref 3.87–5.11)
RDW: 17.1 % — AB (ref 11.5–15.5)
WBC: 10.8 10*3/uL — AB (ref 4.0–10.5)

## 2017-09-18 LAB — BASIC METABOLIC PANEL
Anion gap: 11 (ref 5–15)
BUN: 13 mg/dL (ref 6–20)
CALCIUM: 9.4 mg/dL (ref 8.9–10.3)
CHLORIDE: 94 mmol/L — AB (ref 101–111)
CO2: 34 mmol/L — AB (ref 22–32)
CREATININE: 0.54 mg/dL (ref 0.44–1.00)
GFR calc Af Amer: >60 mL/min (ref >60)
GFR calc non Af Amer: >60 mL/min (ref >60)
GLUCOSE: 191 mg/dL — AB (ref 65–99)
Potassium: 3.8 mmol/L (ref 3.5–5.1)
Sodium: 139 mmol/L (ref 135–145)

## 2017-09-18 LAB — GLUCOSE, CAPILLARY
GLUCOSE-CAPILLARY: 162 mg/dL — AB (ref 65–99)
GLUCOSE-CAPILLARY: 148 mg/dL — AB (ref 65–99)
Glucose-Capillary: 177 mg/dL — ABNORMAL HIGH (ref 65–99)
Glucose-Capillary: 121 mg/dL — ABNORMAL HIGH (ref 65–99)
Glucose-Capillary: 120 mg/dL — ABNORMAL HIGH (ref 65–99)

## 2017-09-18 MED ORDER — ROPINIROLE HCL 0.5 MG PO TABS
0.5000 mg | ORAL_TABLET | Freq: Every day | ORAL | Status: DC
  Administered 2017-09-18 – 2017-09-24 (×7): 0.5 mg
  Filled 2017-09-18 (×8): qty 1

## 2017-09-18 MED ORDER — METHYLPREDNISOLONE SODIUM SUCC 40 MG IJ SOLR
40.0000 mg | Freq: Three times a day (TID) | INTRAMUSCULAR | Status: DC
  Administered 2017-09-18 – 2017-09-21 (×9): 40 mg via INTRAVENOUS
  Filled 2017-09-18 (×9): qty 1

## 2017-09-18 NOTE — Progress Notes (Signed)
PULMONARY / CRITICAL CARE MEDICINE   Name: Danielle Stevens MRN: [id number] DOB: [birth year]    ADMISSION DATE:  08/22/2017  REFERRING MD:  Dr. Pearlean Brownie, ER  CHIEF COMPLAINT:  Short of breath  HISTORY OF PRESENT ILLNESS:   47 yo female former smoker with A1AT and severe emphysema s/p trach and chronic vent on weekly prolastin transferred from [facility] with altered mental status and hypercapnia.  Found to have MRSE bacteremia with concern for port infection.  PMHx of DM, HTN, Anxiety/depression.  SUBJECTIVE:  Anxiety improved.  Not able to tolerate vent weaning.  VITAL SIGNS: BP 126/83   Pulse (!) 101   Temp 98.7 F (37.1 C) (Oral)   Resp (!) 24   Ht 5\' 4"  (1.626 m)   Wt 185 lb 10 oz (84.2 kg)   LMP  (LMP Unknown)   SpO2 95%   BMI 31.86 kg/m   VENTILATOR SETTINGS: Vent Mode: PRVC FiO2 (%):  [40 %] 40 % Set Rate:  [20 bmp] 20 bmp Vt Set:  [480 mL] 480 mL PEEP:  [8 cmH20] 8 cmH20 Plateau Pressure:  [19 cmH20-32 cmH20] 19 cmH20  INTAKE / OUTPUT: I/O last 3 completed shifts: In: 3750.9 [I.V.:660.9; NG/GT:3090] Out: 1805 [Urine:1805]  PHYSICAL EXAMINATION:  General - pleasant Eyes - pupils reactive ENT - trach site clean Cardiac - regular, no murmur Chest - prolonged exhalation Abd - soft, non tender Ext - no edema Skin - no rashes Neuro - normal strength Psych - normal mood   LABS:  BMET Recent Labs  Lab 09/16/17 0406 09/17/17 0410 09/18/17 0411  NA 143 139 139  K 3.2* 3.7 3.8  CL 95* 94* 94*  CO2 41* 33* 34*  BUN 13 13 13   CREATININE 0.50 0.53 0.54  GLUCOSE 160* 217* 191*    Electrolytes Recent Labs  Lab 09/11/17 1230  09/16/17 0406 09/17/17 0410 09/18/17 0411  CALCIUM 9.1   < > 9.3 9.5 9.4  MG 1.9  --   --  1.7  --    < > = values in this interval not displayed.    CBC Recent Labs  Lab 09/16/17 0406 09/17/17 0410 09/18/17 0411  WBC 6.0 6.3 10.8*  HGB 9.4* 10.0* 10.1*  HCT 33.1* 34.1* 34.6*  PLT 154 165 242    Coag's No results  for input(s): APTT, INR in the last 168 hours.  Sepsis Markers No results for input(s): LATICACIDVEN, PROCALCITON, O2SATVEN in the last 168 hours.  ABG Recent Labs  Lab 09/16/17 0630 09/17/17 0330  PHART 7.352 7.479*  PCO2ART 74.4* 49.6*  PO2ART 83.9 147*    Liver Enzymes Recent Labs  Lab 09/13/17 0500 09/16/17 0406 09/17/17 0410  AST 19 18 24   ALT 17 16 19   ALKPHOS 50 50 65  BILITOT 0.7 0.6 0.6  ALBUMIN 3.1* 2.9* 3.2*    Cardiac Enzymes No results for input(s): TROPONINI, PROBNP in the last 168 hours.  Glucose Recent Labs  Lab 09/17/17 0848 09/17/17 1205 09/17/17 1609 09/17/17 1916 09/17/17 2351 09/18/17 0420  GLUCAP 187* 178* 166* 117* 166* 177*    Imaging No results found.   STUDIES:  Echo 4/29 >> EF 55 to 60% CT angio chest 5/21 >> mild atherosclerosis, enlarged PA, centrilobular and paraseptal emphysema with dominant bulla in superior segment LLL, ATX, small b/l effusions  CULTURES: Blood 4/26 >> MRSE  ANTIBIOTICS: Vancomycin 4/27 >> 5/15  C diff 5/01 >> Ag positive, toxin negative, PCR negative Blood 5/01 >> negative  SIGNIFICANT EVENTS: 4/26 Transfer  from [facility] 4/29 ID, CCS consulted 4/30 removal of Rt [place] port 5/01 CCS s/o 5/09 Transfuse 1 unit PRBC 5/21 Change sedation, start steroids 5/22 d/w [facility] and [facility] about transfer for transplant consideration >> not candidate (BMI needs to be < 28, be able to walk 1000 ft, deconditioning)  LINES/TUBES: Trach >>  Lt PICC 5/06 >>   DISCUSSION: She has chronic hypoxic, 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  She is trach/vent dependent, and essentially bed bound.  Does not seem to be any reversible processes that can be treated to improve her current status.  As such she is not a transplant candidate, which would have been her only realistic option for improvement in her quality of life.  Had detailed d/w her about this on 09/18/17.  She is agreeable to have  further goals of care discussions with palliative care team.  ASSESSMENT / PLAN:  Acute on chronic hypoxia/hypercapnic respiratory failure. 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 - full vent support - goal SpO2 88 to 95% - wean off solumedrol as able - f/u CXR intermittently - scheduled BDs - prolastin on Fridays  MRSE from infected port. - completed ABx 5/15  Anxiety with depression. - RASS goal 0 to -1 - continue diprivan, fentanyl gtt - continue klonopin 1 mg tid, doxepin 10 mg qhs, paroxetine 30 mg daily, buspar 15 mg bid, seroquel to 100 mg bid, requip 0.5 mg qhs  DM type II. - SSI with lantus  Anemia of critical illness and chronic disease. - f/u CBC intermittently  Hypokalemia. - replace as needed  DVT prophylaxis - lovenox SUP - pepcid Nutrition - tube feeds Goals of care - full code  Coralyn Helling, MD [facility] Pulmonary/Critical Care 09/18/2017, 8:23 AM

## 2017-09-18 NOTE — Progress Notes (Signed)
Physical Therapy Treatment Patient Details Name: Danielle Stevens MRN: [id number] DOB: [birth year] Today's Date: 09/18/2017    History of Present Illness Pt is a 47 y.o. female with alpha-1 antitrypsin deficiency and has ventilator dependent respiratory failure, admitted from [facility] on 08/22/17 due to sudden unresponsiveness. Found to have worsening acidosis in setting of mucous plug from purulent bronchitis; also with MRSE bacteremia and hypotension. PMH includes anxiety, asthma, COPD, DM, emphysema lung, HTN. Of note, recently admitted 05/16/17-06/03/17 for COPD exacerbation requiring intubation; tracheostomy done later with d/c to SN[facility].     PT Comments    Pt progressing towards strength goals today, however refused to perform bed mobility secondary to increased rate of ventilator alarming today for high pressure (see General Comments) . Pt able to work on core strength by performing long sitting in bed and working to hold upright without assist. Pt performed AROM in LE. D/c plans remain appropriate at this time.      Follow Up Recommendations  SNF     Equipment Recommendations  None recommended by PT    Recommendations for Other Services       Precautions / Restrictions Precautions Precautions: Fall Precaution Comments: trach/vent, peg tube, high anxiety Restrictions Weight Bearing Restrictions: No    Mobility  Bed Mobility               General bed mobility comments: pt ventilator has been alarming and pt does not want to move to EoB   Transfers                    Ambulation/Gait             General Gait Details: unable       Balance   Sitting-balance support: Feet supported;No upper extremity supported;Bilateral upper extremity supported;Single extremity supported Sitting balance-Leahy Scale: Poor Sitting balance - Comments: UE support able to maintain 15 sec in long sitting w/o support                                      Cognition Arousal/Alertness: Awake/alert Behavior During Therapy: WFL for tasks assessed/performed;Anxious Overall Cognitive Status: Difficult to assess                                 General Comments: Pt nodding yes/no and mouthing responses to questions appropriately. Follows commands      Exercises General Exercises - Lower Extremity Heel Slides: AROM;Both;Seated(long sitting ) Other Exercises Other Exercises: 5x long sitting in bed x 30 sec, 1 bout pt able to attain longsittin without assist however required minA to maintain. on another attempt pt able to hold upright without assist for 10 sec, with back supported able to perform heel slides.     General Comments General comments (skin integrity, edema, etc.): vent settings FiO2 4O%O2, pEEP 8, SaO2 greater than 92%O2 during session, high rate of ventilator alarming for high pressure, RN suctioned, HR max with long sitting in bed 133 bpm       Pertinent Vitals/Pain Pain Assessment: Faces Faces Pain Scale: Hurts a little bit Pain Location: generalized with breathing  Pain Descriptors / Indicators: Discomfort;Grimacing Pain Intervention(s): Limited activity within patient's tolerance;Repositioned;Relaxation           PT Goals (current goals can now be found in the care plan section) Acute Rehab PT Goals  Patient Stated Goal: not stated PT Goal Formulation: With patient Time For Goal Achievement: 09/23/17 Potential to Achieve Goals: Fair Progress towards PT goals: Progressing toward goals    Frequency    Min 2X/week      PT Plan Current plan remains appropriate       AM-PAC PT "6 Clicks" Daily Activity  Outcome Measure  Difficulty turning over in bed (including adjusting bedclothes, sheets and blankets)?: Unable Difficulty moving from lying on back to sitting on the side of the bed? : Unable Difficulty sitting down on and standing up from a chair with arms (e.g., wheelchair, bedside commode,  etc,.)?: Unable Help needed moving to and from a bed to chair (including a wheelchair)?: Total Help needed walking in hospital room?: Total Help needed climbing 3-5 steps with a railing? : Total 6 Click Score: 6    End of Session Equipment Utilized During Treatment: Oxygen(vent with trach) Activity Tolerance: Treatment limited secondary to medical complications (Comment) Patient left: in chair;with call bell/phone within reach;with chair alarm set Nurse Communication: Mobility status;Need for lift equipment PT Visit Diagnosis: Other abnormalities of gait and mobility (R26.89);Muscle weakness (generalized) (M62.81)     Time: 9604-5409 PT Time Calculation (min) (ACUTE ONLY): 20 min  Charges:  $Therapeutic Exercise: 8-22 mins                    G Codes:       Danielle Stevens B. Beverely Risen PT, DPT Acute Rehabilitation  [phone] Pager [phone]     Elon Alas Fleet 09/18/2017, 5:13 PM

## 2017-09-18 NOTE — Progress Notes (Signed)
Called to bedside to assess ventilator alarms.  Patient alarming high pressures and flow regulation.  She is in no distress and is able to phonate around vent/trach clearly.  Breath sounds diminished bilaterally.  Noted patient to have full inspiratory volumes on vent but low expiratory volumes.  Able to pass suction catheter without difficulty.  Air added to trach cuff (as balloon felt soft).  Patient's ability to speak resolved, volumes returned to normal. Ventilator alarming decreased.  Await CXR to ensure no pneumothorax.  Will assess to determine if she would be a candidate for different type of trach to allow for speech while on vent.    Canary Brim, NP-C [place] Pulmonary & Critical Care Pgr: [phone] or if no answer [phone] 09/18/2017, 2:57 PM

## 2017-09-18 NOTE — Progress Notes (Signed)
This note also relates to the following rows which could not be included: SpO2 - Cannot attach notes to unvalidated device data  RR changed to 16 per MD. Vitals are stable. RT will continue to monitor.

## 2017-09-19 DIAGNOSIS — Z7189 Other specified counseling: Secondary | ICD-10-CM

## 2017-09-19 DIAGNOSIS — F411 Generalized anxiety disorder: Secondary | ICD-10-CM

## 2017-09-19 DIAGNOSIS — Z515 Encounter for palliative care: Secondary | ICD-10-CM

## 2017-09-19 LAB — CBC WITH DIFFERENTIAL/PLATELET
ABS IMMATURE GRANULOCYTES: 0.4 10*3/uL — AB (ref 0.0–0.1)
BASOS ABS: 0 10*3/uL (ref 0.0–0.1)
Basophils Relative: 0 %
Eosinophils Absolute: 0.3 10*3/uL (ref 0.0–0.7)
Eosinophils Relative: 3 %
HCT: 35.9 % — ABNORMAL LOW (ref 36.0–46.0)
HEMOGLOBIN: 10.1 g/dL — AB (ref 12.0–15.0)
Immature Granulocytes: 4 %
LYMPHS PCT: 16 %
Lymphs Abs: 1.5 10*3/uL (ref 0.7–4.0)
MCH: 24.6 pg — AB (ref 26.0–34.0)
MCHC: 28.1 g/dL — ABNORMAL LOW (ref 30.0–36.0)
MCV: 87.3 fL (ref 78.0–100.0)
MONO ABS: 0.9 10*3/uL (ref 0.1–1.0)
Monocytes Relative: 10 %
NEUTROS ABS: 6.7 10*3/uL (ref 1.7–7.7)
Neutrophils Relative %: 67 %
Platelets: 261 10*3/uL (ref 150–400)
RBC: 4.11 MIL/uL (ref 3.87–5.11)
RDW: 17.2 % — ABNORMAL HIGH (ref 11.5–15.5)
WBC: 9.8 10*3/uL (ref 4.0–10.5)

## 2017-09-19 LAB — BASIC METABOLIC PANEL
ANION GAP: 11 (ref 5–15)
BUN: 19 mg/dL (ref 6–20)
CALCIUM: 9.9 mg/dL (ref 8.9–10.3)
CO2: 36 mmol/L — AB (ref 22–32)
Chloride: 95 mmol/L — ABNORMAL LOW (ref 101–111)
Creatinine, Ser: 0.6 mg/dL (ref 0.44–1.00)
GFR calc Af Amer: >60 mL/min (ref >60)
GFR calc non Af Amer: >60 mL/min (ref >60)
GLUCOSE: 137 mg/dL — AB (ref 65–99)
POTASSIUM: 3.9 mmol/L (ref 3.5–5.1)
Sodium: 142 mmol/L (ref 135–145)

## 2017-09-19 LAB — GLUCOSE, CAPILLARY
GLUCOSE-CAPILLARY: 113 mg/dL — AB (ref 65–99)
GLUCOSE-CAPILLARY: 127 mg/dL — AB (ref 65–99)
GLUCOSE-CAPILLARY: 136 mg/dL — AB (ref 65–99)
GLUCOSE-CAPILLARY: 144 mg/dL — AB (ref 65–99)
GLUCOSE-CAPILLARY: 148 mg/dL — AB (ref 65–99)
Glucose-Capillary: 116 mg/dL — ABNORMAL HIGH (ref 65–99)
Glucose-Capillary: 134 mg/dL — ABNORMAL HIGH (ref 65–99)

## 2017-09-19 MED ORDER — FENTANYL CITRATE (PF) 100 MCG/2ML IJ SOLN
25.0000 ug | INTRAMUSCULAR | Status: DC | PRN
  Administered 2017-09-20: 50 ug via INTRAVENOUS
  Administered 2017-09-21 – 2017-09-23 (×4): 100 ug via INTRAVENOUS
  Filled 2017-09-19 (×5): qty 2

## 2017-09-19 NOTE — Progress Notes (Addendum)
PULMONARY / CRITICAL CARE MEDICINE   Name: Danielle Stevens MRN: [id number] DOB: March 10, 1971    ADMISSION DATE:  08/22/2017  REFERRING MD:  Dr. Pearlean Brownie, ER  CHIEF COMPLAINT:  Short of breath  HISTORY OF PRESENT ILLNESS:   47 yo female former smoker with A1AT and severe emphysema s/p trach and chronic vent on weekly prolastin transferred from [facility] with altered mental status and hypercapnia.  Found to have MRSE bacteremia with concern for port infection.  PMHx of DM, HTN, Anxiety/depression.  SUBJECTIVE:  Pt reports anxiety much better controlled since being at [facility].  Denies acute complaints.  Denies pain.  VITAL SIGNS: BP (!) 101/51 (BP Location: Right Leg)   Pulse 78   Temp 98 F (36.7 C) (Oral)   Resp 16   Ht 5\' 4"  (1.626 m)   Wt 187 lb 13.3 oz (85.2 kg)   LMP  (LMP Unknown)   SpO2 96%   BMI 32.24 kg/m   VENTILATOR SETTINGS: Vent Mode: PRVC FiO2 (%):  [40 %] 40 % Set Rate:  [16 bmp] 16 bmp Vt Set:  [480 mL] 480 mL PEEP:  [8 cmH20] 8 cmH20 Plateau Pressure:  [24 cmH20-34 cmH20] 34 cmH20  INTAKE / OUTPUT: I/O last 3 completed shifts: In: 2900.3 [I.V.:440.3; NG/GT:2460] Out: 3080 [Urine:3080]  PHYSICAL EXAMINATION: General: chronically ill appearing female in NAD on vent HEENT: MM pink/moist, trach midline c/d/i  Neuro: awake/alert, communicates appropriately  CV: s1s2 rrr, no m/r/g PULM: even/non-labored, lungs bilaterally diminished  YK:DXIP, non-tender, bsx4 active  Extremities: warm/dry, no edema  Skin: no rashes or lesions  LABS:  BMET Recent Labs  Lab 09/17/17 0410 09/18/17 0411 09/19/17 0501  NA 139 139 142  K 3.7 3.8 3.9  CL 94* 94* 95*  CO2 33* 34* 36*  BUN 13 13 19   CREATININE 0.53 0.54 0.60  GLUCOSE 217* 191* 137*    Electrolytes Recent Labs  Lab 09/17/17 0410 09/18/17 0411 09/19/17 0501  CALCIUM 9.5 9.4 9.9  MG 1.7  --   --     CBC Recent Labs  Lab 09/17/17 0410 09/18/17 0411 09/19/17 0501  WBC 6.3 10.8* 9.8  HGB 10.0* 10.1*  10.1*  HCT 34.1* 34.6* 35.9*  PLT 165 242 261    Coag's No results for input(s): APTT, INR in the last 168 hours.  Sepsis Markers No results for input(s): LATICACIDVEN, PROCALCITON, O2SATVEN in the last 168 hours.  ABG Recent Labs  Lab 09/16/17 0630 09/17/17 0330  PHART 7.352 7.479*  PCO2ART 74.4* 49.6*  PO2ART 83.9 147*    Liver Enzymes Recent Labs  Lab 09/13/17 0500 09/16/17 0406 09/17/17 0410  AST 19 18 24   ALT 17 16 19   ALKPHOS 50 50 65  BILITOT 0.7 0.6 0.6  ALBUMIN 3.1* 2.9* 3.2*    Cardiac Enzymes No results for input(s): TROPONINI, PROBNP in the last 168 hours.  Glucose Recent Labs  Lab 09/18/17 1747 09/18/17 2104 09/19/17 0021 09/19/17 0424 09/19/17 0903 09/19/17 1131  GLUCAP 120* 148* 113* 116* 127* 134*    Imaging Dg Chest Port 1 View  Result Date: 09/18/2017 CLINICAL DATA:  Shortness of breath. EXAM: PORTABLE CHEST 1 VIEW COMPARISON:  09/17/2017 FINDINGS: 1457 hours. Lungs are hyperexpanded. Tracheostomy tube again noted. Left PICC line tip overlies the mid SVC. No edema or focal airspace consolidation. The cardiopericardial silhouette is within normal limits for size. Telemetry leads overlie the chest. IMPRESSION: Stable exam. Right lower lobe airspace disease noted on previous study less evident today. Electronically Signed  By: Kennith Center M.D.   On: 09/18/2017 15:11    STUDIES:  Echo 4/29 >> EF 55 to 60% CT angio chest 5/21 >> mild atherosclerosis, enlarged PA, centrilobular and paraseptal emphysema with dominant bulla in superior segment LLL, ATX, small b/l effusions  CULTURES: Blood 4/26 >> MRSE  ANTIBIOTICS: Vancomycin 4/27 >> 5/15  C diff 5/01 >> Ag positive, toxin negative, PCR negative Blood 5/01 >> negative  SIGNIFICANT EVENTS: 4/26 Transfer from [facility] 4/29 ID, CCS consulted 4/30 removal of Rt [place] port 5/01 CCS s/o 5/09 Transfuse 1 unit PRBC 5/21 Change sedation, start steroids 5/22 [facility] called about transfer  for transplant consideration >> not candidate (BMI needs to be < 28, be able to walk 1000 ft, deconditioning)  LINES/TUBES: Trach >>  Lt PICC 5/06 >>   DISCUSSION: She has chronic hypoxic, 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  She is trach/vent dependent, and essentially bed bound.  Does not seem to be any reversible processes that can be treated to improve her current status.  As such she is not a transplant candidate, which would have been her only realistic option for improvement in her quality of life.  Had detailed d/w her about this on 09/18/17.  She is agreeable to have further goals of care discussions with palliative care team.  ASSESSMENT / PLAN:  Acute on chronic hypoxia/hypercapnic respiratory failure. 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 - PRVC support - O2 for sats 88-95% - wean steroids as able, solumedrol 40 mg IV Q8 - continue pulmicort, duoneb Q4, PRN albuterol  - follow intermittent CXR  - continue Q Friday prolastin   MRSE from infected port. - antibiotics completed 5/15   Chronic Hypotension  - continue midodrine 5mg  TID   Anxiety with depression. - RASS Goal: 0 to -1  - continue klonopin 1mg  TID, doxepin 10mg  QHS, paroxetine 30 mg QD, buspar 15 mg BID, seroquel 100mg  BID, requip 0.5 mg QHS  - reduce fentanyl gtt to off, PRN fentanyl for pain  DM type II. - CBG with resistant SSI  - Lantus 14 units QHS   Anemia of critical illness and chronic disease. - trend CBC   Hypokalemia. Hypernatremia  - Free water PT  - Trend BMP / urinary output - Replace electrolytes as indicated - Avoid nephrotoxic agents, ensure adequate renal perfusion   DVT prophylaxis - lovenox SUP - pepcid Nutrition - tube feeds Goals of care - full code Global - await bed availability at [facility].  SW called [facility] regarding transfer 5/24 > bed will be available 5/30.    Canary Brim, NP-C [facility] Pgr: [phone] or if no answer [phone] 09/19/2017, 1:53 PM

## 2017-09-19 NOTE — Consult Note (Signed)
Consultation Note Date: 09/19/2017   Patient Name: Danielle Stevens  DOB: [birth year]  MRN: [id number]  Age / Sex: 47 y.o., female  PCP: Patient, No Pcp Per Referring Physician: Chesley Mires, MD  Reason for Consultation: Establishing goals of care and Psychosocial/spiritual support  HPI/Patient Profile: 47 y.o. female  with past medical history of alpha-1 antitrypsin deficiency, chronic respiratory failure, asthma, MRSA bacteremia, vent/trach/PEG, anxiety admitted on 08/22/2017 with hypercarbic respiratory failure.  Patient was admitted from [facility].  Patient had already gone undergone a tracheostomy when she was hospitalized in [place].  She has since been unable to wean off of the ventilator.  Additionally, she has a PEG tube.  Consult ordered for goals of care.   Clinical Assessment and Goals of Care: Met with patient, chart reviewed.  Spoke to her daughter, Danielle Stevens [phone].  [name] shares with me that patient has no family in the [place] area.  That she is from [facility].  That having her mom hospitalized in [place] has been very difficult.  [name] lives 5 hours away.  In the past patient has been hospitalized at [facility] in [facility] as well as [facility].  She feels that her mother has received poor care while at [place] and does not want her to return to that facility.  Her main goal would be to continue full scope of treatment, and have her mother transferred to a facility within a 2 Hour drive of [facility]  She is aware that currently her mother is not a transplant candidate because of the severity of her underlying lung disease.  Patient as well as daughter are still holding out hope that she could "get stronger" and be considered for the transplant list.  Anxiety seems to be a big component of patient's struggle with acute dyspnea which has also  factored into her inability to wean off of the ventilator.  She is currently on Seroquel BuSpar Klonopin Sinequan Paxil.  She states the medicines do help her.  Another area of difficulty for patient is the fact that her trach placement causes the ventilator to alarm almost continuously.  She states the alarming of the ventilator all the time is contributing to a "good 50%" of her anxiety and that she feels that the alarm means something life-threatening is getting ready to happen.  Patient is able to make some decisions for herself.  Her healthcare proxy would be her daughter, Danielle Stevens [phone].  [name] shares with me that her mother made the decision to go forward with the trach and PEG.  She also shares that she and her mother did not understand that she could still be ventilator dependent once she had a trach, that she would not be able to talk.    SUMMARY OF RECOMMENDATIONS   Continue full scope of treatment, including full code Palliative medicine's team to stay involved and will update daughter over the weekend.  I attempted to elicit a meeting but given that she is 5 hours away she was  unable to commit whether she would be up to visit her mother Sunday or Monday. Their chief goal would be to continue full scope of treatment but to have patient transferred within a 2 Hour drive of daughter who resides in [facility] such as [place] or [place] She wishes for her to continue on weekly injections of Prolastin ; she understands this has been an impediment to disposition Code Status/Advance Care Planning:  Full code    Symptom Management:   Anxiety: Continue with Seroquel 100 mg twice daily, BuSpar 15 mg twice daily, Klonopin 1 mg 3 times daily, Sinequan 10 mg at bedtime, Ativan 1 mg every 2 hours as needed for breakthrough anxiety or panic; Paxil 30 mg daily.  Maximum daily doses of BuSpar is 60 mg daily.  There is also room to uptitrate Paxil to 40 mg daily.   Additionally opioids would help with underlying dyspnea and concur with continuation of fentanyl drip and/or oxycodone 5 to 10 mg every 3 hours as needed for shortness of breath  Dyspnea: Continue to try to do it wean.  Continue with fentanyl continuous infusion as well as oxycodone for breakthrough dyspnea.  Palliative Prophylaxis:   Aspiration, Bowel Regimen, Delirium Protocol, Eye Care, Frequent Pain Assessment, Oral Care and Turn Reposition  Additional Recommendations (Limitations, Scope, Preferences):  Full Scope Treatment  Psycho-social/Spiritual:   Desire for further Chaplaincy support:no  Additional Recommendations: Referral to Community Resources   Prognosis:   Unable to determine  Discharge Planning: To Be Determined      Primary Diagnoses: Present on Admission: . Hypercarbia . Acute on chronic respiratory failure with hypoxia and hypercapnia (HCC) . COPD exacerbation ([place]) . VAP (ventilator-associated pneumonia) ([employer]) . Hypotension   I have reviewed the medical record, interviewed the patient and family, and examined the patient. The following aspects are pertinent.  Past Medical History:  Diagnosis Date  . Alpha-1-antitrypsin deficiency ([place])   . Anemia   . Anxiety   . Asthma   . COPD (chronic obstructive pulmonary disease) ([place])   . Diabetes mellitus without complication ([place])   . Emphysema lung ([place])   . Hypertension    Social History   Socioeconomic History  . Marital status: Single    Spouse name: Not on file  . Number of children: Not on file  . Years of education: Not on file  . Highest education level: Not on file  Occupational History  . Not on file  Social Needs  . Financial resource strain: Not on file  . Food insecurity:    Worry: Not on file    Inability: Not on file  . Transportation needs:    Medical: Not on file    Non-medical: Not on file  Tobacco Use  . Smoking status: Former Smoker    Types: Cigarettes  . Smokeless  tobacco: Never Used  Substance and Sexual Activity  . Alcohol use: Not Currently  . Drug use: Never  . Sexual activity: Not on file  Lifestyle  . Physical activity:    Days per week: Not on file    Minutes per session: Not on file  . Stress: Not on file  Relationships  . Social connections:    Talks on phone: Not on file    Gets together: Not on file    Attends religious service: Not on file    Active member of club or organization: Not on file    Attends meetings of clubs or organizations: Not on file  Relationship status: Not on file  Other Topics Concern  . Not on file  Social History Narrative  . Not on file   History reviewed. No pertinent family history. Scheduled Meds: . acidophilus  1 capsule Oral Daily  . budesonide (PULMICORT) nebulizer solution  0.5 mg Nebulization BID  . busPIRone  15 mg Per Tube BID  . chlorhexidine gluconate (MEDLINE KIT)  15 mL Mouth Rinse BID  . Chlorhexidine Gluconate Cloth  6 each Topical Daily  . clonazepam  1 mg Per Tube TID  . doxepin  10 mg Per Tube QHS  . enoxaparin (LOVENOX) injection  40 mg Subcutaneous Q24H  . famotidine  20 mg Per Tube BID  . free water  300 mL Per Tube Q4H  . insulin aspart  0-20 Units Subcutaneous Q4H  . insulin glargine  14 Units Subcutaneous QHS  . ipratropium-albuterol  3 mL Nebulization Q4H  . mouth rinse  15 mL Mouth Rinse QID  . methylPREDNISolone (SOLU-MEDROL) injection  40 mg Intravenous Q8H  . midodrine  5 mg Per Tube TID WC  . multivitamin  15 mL Per Tube Daily  . PARoxetine  30 mg Per Tube Daily  . polyethylene glycol  17 g Per Tube Daily  . QUEtiapine  100 mg Per Tube BID  . rOPINIRole  0.5 mg Per Tube QHS  . sodium chloride flush  10-40 mL Intracatheter Q12H   Continuous Infusions: . alpha-1 proteinase inhibitor (PROLASTIN-C) infusion Stopped (09/12/17 1359)  . feeding supplement (VITAL AF 1.2 CAL) 60 mL/hr at 09/19/17 0600  . fentaNYL infusion INTRAVENOUS 125 mcg/hr (09/19/17 0600)  .  propofol (DIPRIVAN) infusion Stopped (09/17/17 1530)   PRN Meds:.acetaminophen (TYLENOL) oral liquid 160 mg/5 mL, albuterol, fentaNYL, LORazepam, midazolam, ondansetron, oxyCODONE, promethazine, sodium chloride flush Medications Prior to Admission:  Prior to Admission medications   Medication Sig Start Date End Date Taking? Authorizing Provider  acetaZOLAMIDE (DIAMOX) 250 MG tablet Take 250 mg by mouth.   Yes [provider]  busPIRone (BUSPAR) 15 MG tablet Take 15 mg by mouth 2 (two) times daily.   Yes [provider]  calcium carbonate (TUMS - DOSED IN MG ELEMENTAL CALCIUM) 500 MG chewable tablet Place 1 tablet into feeding tube every 8 (eight) hours as needed for indigestion or heartburn.   Yes [provider]  cetirizine (ZYRTEC) 10 MG tablet Place 10 mg into feeding tube at bedtime.   Yes [provider]  clonazePAM (KLONOPIN) 1 MG tablet Place 1 mg into feeding tube 3 (three) times daily.   Yes [provider]  diltiazem (CARDIZEM) 30 MG tablet Take 30 mg by mouth every 8 (eight) hours.   Yes [provider]  doxepin (SINEQUAN) 10 MG capsule Place 10 mg into feeding tube at bedtime as needed (for insomnia).   Yes [provider]  enoxaparin (LOVENOX) 40 MG/0.4ML injection Inject 40 mg into the skin daily.   Yes [provider]  furosemide (LASIX) 40 MG tablet Place 40 mg into feeding tube daily.   Yes [provider]  Heparin Lock Flush (HEP-LOCK IV) Inject 500 Units into the vein every 12 (twelve) hours. "FOR PATENCY"   Yes [provider]  insulin aspart (NOVOLOG) 100 unit/mL injection Inject 3-13 Units into the skin See admin instructions. Inject 3-13 units into the skin three times a day before meals, per sliding scale: BGL 151-200 = 3 units; 201-250 = 5 units; 251-300 = 7 units; 301-350 = 9 units; 351-400 = 11 units;  401-450 = 13 units and call the MD   Yes [provider]  insulin detemir  (LEVEMIR) 100 unit/ml SOLN Inject 18 Units into the skin at bedtime.   Yes [provider]  LORazepam (ATIVAN) 2 MG/ML concentrated solution Place 0.5 mg into feeding tube every 12 (twelve) hours as needed for anxiety.   Yes [provider]  metoprolol tartrate (LOPRESSOR) 50 MG tablet Place 50 mg into feeding tube 2 times daily at 12 noon and 4 pm.   Yes [provider]  Metoprolol Tartrate (METOPROLOL TARTARATE 1 MG/ML SYRINGE, 5ML,) Inject 5 mg into the vein every 4 (four) hours as needed (for hypertension).   Yes [provider]  montelukast (SINGULAIR) 10 MG tablet Place 10 mg into feeding tube at bedtime.   Yes [provider]  omeprazole (PRILOSEC) 40 MG capsule 40 mg per tube every morning   Yes [provider]  ondansetron (ZOFRAN) 4 MG tablet Place 4 mg into feeding tube every 4 (four) hours as needed for nausea or vomiting.   Yes [provider]  oxyCODONE (OXY IR/ROXICODONE) 5 MG immediate release tablet Take 5 mg by mouth every 8 (eight) hours as needed for severe pain.   Yes [provider]  PARoxetine (PAXIL) 30 MG tablet Place 30 mg into feeding tube daily.   Yes [provider]  potassium chloride 20 MEQ/15ML (10%) SOLN Place 20 mEq into feeding tube daily.   Yes [provider]  PRESCRIPTION MEDICATION Hydralazine 20 mg/ml: 0.5 ml per tube every four hours as needed for hypertension   Yes [provider]  QUEtiapine (SEROQUEL) 50 MG tablet Place 50 mg into feeding tube 2 (two) times daily.   Yes [provider]  rOPINIRole (REQUIP) 0.5 MG tablet Place 0.5 mg into feeding tube at bedtime.   Yes [provider]   Allergies  Allergen Reactions  . Wasp Venom Protein Shortness Of Breath  . Adhesive [Tape] Other (See Comments)    Bruises   . Coconut Oil Hives  . Latex Other (See Comments)    Bruises   . Robitussin Severe Multi-Symp [Phenylephrine-Dm-Gg-Apap] Diarrhea  and Other (See Comments)    Allergic, per verbal MAR  . Shellfish-Derived Products Hives and Other (See Comments)    Allergic, per verbal MAR   Review of Systems  Unable to perform ROS: Other    Physical Exam  Constitutional: She is oriented to person, place, and time. She appears well-developed and well-nourished.  Acutely ill-appearing middle-aged female, trach, ventilator dependent  HENT:  Head: Normocephalic and atraumatic.  Neck:  Trach  Cardiovascular: Normal rate.  Pulmonary/Chest:  Patient has a tracheostomy and is ventilator dependent  Abdominal: Soft.  Neurological: She is alert and oriented to person, place, and time.  Skin: Skin is warm and dry.  Psychiatric:  Very anxious  Nursing note and vitals reviewed.   Vital Signs: BP (!) 101/51 (BP Location: Right Leg)   Pulse 78   Temp 98 F (36.7 C) (Oral)   Resp 16   Ht '5\' 4"'$  (1.626 m)   Wt 85.2 kg (187 lb 13.3 oz)   LMP  (LMP Unknown)   SpO2 96%   BMI 32.24 kg/m  Pain Scale: 0-10   Pain Score: 0-No pain   SpO2: SpO2: 96 % O2 Device:SpO2: 96 % O2 Flow Rate: .O2 Flow Rate (L/min): 40 L/min  IO: Intake/output summary:   Intake/Output Summary (Last 24 hours) at 09/19/2017 1309 Last data filed at 09/19/2017  1200 Gross per 24 hour  Intake 2017.5 ml  Output 2880 ml  Net -862.5 ml    LBM: Last BM Date: 09/17/17 Baseline Weight: Weight: 74.8 kg (165 lb) Most recent weight: Weight: 85.2 kg (187 lb 13.3 oz)     Palliative Assessment/Data:   Flowsheet Rows     Most Recent Value  Intake Tab  Referral Department  Hospitalist  Unit at Time of Referral  Intermediate Care Unit  Palliative Care Primary Diagnosis  Pulmonary  Date Notified  09/18/17  Reason for referral  Clarify Goals of Care, Psychosocial or Spiritual support  Date of Admission  08/22/17  Date first seen by Palliative Care  09/19/17  # of days Palliative referral response time  1 Day(s)  # of days IP prior to Palliative referral  27    Clinical Assessment  Palliative Performance Scale Score  30%  Pain Max last 24 hours  0  Pain Min Last 24 hours  0  Dyspnea Max Last 24 Hours  7  Dyspnea Min Last 24 hours  2  Nausea Max Last 24 Hours  0  Nausea Min Last 24 Hours  0  Anxiety Max Last 24 Hours  8  Other Max Last 24 Hours  0  Psychosocial & Spiritual Assessment  Palliative Care Outcomes  Patient/Family meeting held?  Yes  Who was at the meeting?  pt, daughter  Palliative Care follow-up planned  Yes, Facility      Time In: 0900 Time Out: 1010 Time Total: 70 min Greater than 50%  of this time was spent counseling and coordinating care related to the above assessment and plan.  Signed by: Dory Horn, NP   Please contact Palliative Medicine Team phone at [phone] for questions and concerns.  For individual provider: See Shea Evans

## 2017-09-19 NOTE — Clinical Social Work Note (Signed)
CSW talked with Merry Proud, NP with Critical  Care regarding patient's readiness for discharge. Call made to [facility] with [facility] and they will not have a bed for patient on the 28th, however they can take patient on the 30th. Brandi contacted and updated. Unit CSW will facilitate discharge to [facility] on the 30th.  Genelle Bal, MSW, LCSW Licensed Clinical Social Worker Clinical Social Work Department [employer] [phone]

## 2017-09-19 NOTE — Plan of Care (Signed)
Continuous sedation turned off today. Otherwise, no acute changes this shift.

## 2017-09-19 NOTE — Progress Notes (Signed)
Fentanyl drip DC'd at this time. The patient;s current bag of medication ran down to empty, and the med was DC'd as the bag ran empty. No drug was wasted.

## 2017-09-20 LAB — BASIC METABOLIC PANEL
Anion gap: 10 (ref 5–15)
BUN: 20 mg/dL (ref 6–20)
CHLORIDE: 98 mmol/L — AB (ref 101–111)
CO2: 36 mmol/L — AB (ref 22–32)
CREATININE: 0.52 mg/dL (ref 0.44–1.00)
Calcium: 9.5 mg/dL (ref 8.9–10.3)
GFR calc Af Amer: >60 mL/min (ref >60)
GFR calc non Af Amer: >60 mL/min (ref >60)
Glucose, Bld: 125 mg/dL — ABNORMAL HIGH (ref 65–99)
Potassium: 3.9 mmol/L (ref 3.5–5.1)
Sodium: 144 mmol/L (ref 135–145)

## 2017-09-20 LAB — CBC
HEMATOCRIT: 35.2 % — AB (ref 36.0–46.0)
HEMOGLOBIN: 10.1 g/dL — AB (ref 12.0–15.0)
MCH: 24.9 pg — ABNORMAL LOW (ref 26.0–34.0)
MCHC: 28.7 g/dL — ABNORMAL LOW (ref 30.0–36.0)
MCV: 86.7 fL (ref 78.0–100.0)
Platelets: 270 10*3/uL (ref 150–400)
RBC: 4.06 MIL/uL (ref 3.87–5.11)
RDW: 16.8 % — ABNORMAL HIGH (ref 11.5–15.5)
WBC: 10.1 10*3/uL (ref 4.0–10.5)

## 2017-09-20 LAB — GLUCOSE, CAPILLARY
GLUCOSE-CAPILLARY: 122 mg/dL — AB (ref 65–99)
GLUCOSE-CAPILLARY: 176 mg/dL — AB (ref 65–99)
Glucose-Capillary: 190 mg/dL — ABNORMAL HIGH (ref 65–99)
Glucose-Capillary: 174 mg/dL — ABNORMAL HIGH (ref 65–99)
Glucose-Capillary: 171 mg/dL — ABNORMAL HIGH (ref 65–99)
Glucose-Capillary: 106 mg/dL — ABNORMAL HIGH (ref 65–99)

## 2017-09-20 NOTE — Progress Notes (Signed)
PULMONARY / CRITICAL CARE MEDICINE   Name: Danielle Stevens MRN: [id number] DOB: 22-Oct-1970    ADMISSION DATE:  08/22/2017  REFERRING MD:  Dr. Pearlean Brownie, ER  CHIEF COMPLAINT:  Short of breath  HISTORY OF PRESENT ILLNESS:   47 yo female former smoker with A1AT and severe emphysema s/p trach and chronic vent on weekly prolastin transferred from [facility] with altered mental status and hypercapnia.  Found to have MRSE bacteremia with concern for port infection.  PMHx of DM, HTN, Anxiety/depression.  SUBJECTIVE:  She is on th event. Denies any shortness of breath or pain.   VITAL SIGNS: BP 130/83   Pulse (!) 107   Temp 97.6 F (36.4 C)   Resp (!) 22   Ht 5\' 4"  (1.626 m)   Wt 83.2 kg (183 lb 6.8 oz)   LMP  (LMP Unknown)   SpO2 (!) 83%   BMI 31.48 kg/m   VENTILATOR SETTINGS: Vent Mode: PRVC FiO2 (%):  [40 %] 40 % Set Rate:  [16 bmp] 16 bmp Vt Set:  [480 mL] 480 mL PEEP:  [8 cmH20] 8 cmH20 Plateau Pressure:  [28 cmH20-34 cmH20] 28 cmH20  INTAKE / OUTPUT: I/O last 3 completed shifts: In: 4532.5 [I.V.:252.5; Other:240; NG/GT:3960; IV Piggyback:80] Out: 3430 [Urine:3430]  PHYSICAL EXAMINATION: General: chronically ill appearing female in NAD on vent HEENT: MM pink/moist, trach midline c/d/i  Neuro: awake/alert, communicates appropriately  CV: s1s2 rrr, no m/r/g PULM: even/non-labored, lungs bilaterally diminished  WU:JWJX, non-tender, bsx4 active  Extremities: warm/dry, no edema  Skin: no rashes or lesions  LABS:  BMET Recent Labs  Lab 09/18/17 0411 09/19/17 0501 09/20/17 0446  NA 139 142 144  K 3.8 3.9 3.9  CL 94* 95* 98*  CO2 34* 36* 36*  BUN 13 19 20   CREATININE 0.54 0.60 0.52  GLUCOSE 191* 137* 125*    Electrolytes Recent Labs  Lab 09/17/17 0410 09/18/17 0411 09/19/17 0501 09/20/17 0446  CALCIUM 9.5 9.4 9.9 9.5  MG 1.7  --   --   --     CBC Recent Labs  Lab 09/18/17 0411 09/19/17 0501 09/20/17 0446  WBC 10.8* 9.8 10.1  HGB 10.1* 10.1* 10.1*   HCT 34.6* 35.9* 35.2*  PLT 242 261 270    Coag's No results for input(s): APTT, INR in the last 168 hours.  Sepsis Markers No results for input(s): LATICACIDVEN, PROCALCITON, O2SATVEN in the last 168 hours.  ABG Recent Labs  Lab 09/16/17 0630 09/17/17 0330  PHART 7.352 7.479*  PCO2ART 74.4* 49.6*  PO2ART 83.9 147*    Liver Enzymes Recent Labs  Lab 09/16/17 0406 09/17/17 0410  AST 18 24  ALT 16 19  ALKPHOS 50 65  BILITOT 0.6 0.6  ALBUMIN 2.9* 3.2*    Cardiac Enzymes No results for input(s): TROPONINI, PROBNP in the last 168 hours.  Glucose Recent Labs  Lab 09/19/17 1131 09/19/17 1611 09/19/17 1928 09/19/17 2305 09/20/17 0305 09/20/17 0817  GLUCAP 134* 136* 148* 144* 190* 174*    Imaging No results found.  STUDIES:  Echo 4/29 >> EF 55 to 60% CT angio chest 5/21 >> mild atherosclerosis, enlarged PA, centrilobular and paraseptal emphysema with dominant bulla in superior segment LLL, ATX, small b/l effusions  CULTURES: Blood 4/26 >> MRSE  ANTIBIOTICS: Vancomycin 4/27 >> 5/15  C diff 5/01 >> Ag positive, toxin negative, PCR negative Blood 5/01 >> negative  SIGNIFICANT EVENTS: 4/26 Transfer from [facility] 4/29 ID, CCS consulted 4/30 removal of Rt [place] port 5/01 CCS s/o  5/09 Transfuse 1 unit PRBC 5/21 Change sedation, start steroids 5/22 [facility] called about transfer for transplant consideration >> not candidate (BMI needs to be < 28, be able to walk 1000 ft, deconditioning)  LINES/TUBES: Trach >>  Lt PICC 5/06 >>   DISCUSSION: She has chronic hypoxic, 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  She is trach/vent dependent, and essentially bed bound.  Does not seem to be any reversible processes that can be treated to improve her current status.  As such she is not a transplant candidate, which would have been her only realistic option for improvement in her quality of life.     ASSESSMENT / PLAN:  Acute on  chronic hypoxia/hypercapnic respiratory failure. 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 - PRVC support - O2 for sats 88-95% - wean steroids as able, solumedrol 40 mg IV Q8 - continue pulmicort, duoneb Q4, PRN albuterol  - follow intermittent CXR  - continue Q Friday prolastin   MRSE from infected port. - antibiotics completed 5/15   Chronic Hypotension  - continue midodrine 5mg  TID   Anxiety with depression. - RASS Goal: 0 to -1  - continue klonopin 1mg  TID, doxepin 10mg  QHS, paroxetine 30 mg QD, buspar 15 mg BID, seroquel 100mg  BID, requip 0.5 mg QHS  -PRN fentanyl for pain  DM type II. - CBG with resistant SSI  - Lantus 14 units QHS   Anemia of critical illness and chronic disease. - trend CBC   Hypokalemia. Hypernatremia  - Free water PT  - Trend BMP / urinary output - Replace electrolytes as indicated - Avoid nephrotoxic agents, ensure adequate renal perfusion   DVT prophylaxis - lovenox SUP - pepcid Nutrition - tube feeds Goals of care - full code Global - await bed availability at [facility]  SW called [facility] regarding transfer 5/24 > bed will be available 5/30.

## 2017-09-21 LAB — CBC WITH DIFFERENTIAL/PLATELET
Abs Immature Granulocytes: 0.6 10*3/uL — ABNORMAL HIGH (ref 0.0–0.1)
BASOS ABS: 0 10*3/uL (ref 0.0–0.1)
BASOS PCT: 0 %
EOS ABS: 0.1 10*3/uL (ref 0.0–0.7)
Eosinophils Relative: 1 %
HCT: 38.9 % (ref 36.0–46.0)
Hemoglobin: 11.2 g/dL — ABNORMAL LOW (ref 12.0–15.0)
IMMATURE GRANULOCYTES: 4 %
LYMPHS ABS: 1.1 10*3/uL (ref 0.7–4.0)
Lymphocytes Relative: 8 %
MCH: 24.9 pg — ABNORMAL LOW (ref 26.0–34.0)
MCHC: 28.8 g/dL — ABNORMAL LOW (ref 30.0–36.0)
MCV: 86.6 fL (ref 78.0–100.0)
MONOS PCT: 6 %
Monocytes Absolute: 0.8 10*3/uL (ref 0.1–1.0)
NEUTROS ABS: 11.5 10*3/uL — AB (ref 1.7–7.7)
NEUTROS PCT: 81 %
PLATELETS: 309 10*3/uL (ref 150–400)
RBC: 4.49 MIL/uL (ref 3.87–5.11)
RDW: 16.9 % — AB (ref 11.5–15.5)
WBC: 14 10*3/uL — AB (ref 4.0–10.5)

## 2017-09-21 LAB — BASIC METABOLIC PANEL
ANION GAP: 9 (ref 5–15)
BUN: 22 mg/dL — ABNORMAL HIGH (ref 6–20)
CALCIUM: 9.3 mg/dL (ref 8.9–10.3)
CO2: 35 mmol/L — ABNORMAL HIGH (ref 22–32)
Chloride: 95 mmol/L — ABNORMAL LOW (ref 101–111)
Creatinine, Ser: 0.55 mg/dL (ref 0.44–1.00)
Glucose, Bld: 158 mg/dL — ABNORMAL HIGH (ref 65–99)
Potassium: 3.9 mmol/L (ref 3.5–5.1)
SODIUM: 139 mmol/L (ref 135–145)

## 2017-09-21 LAB — GLUCOSE, CAPILLARY
GLUCOSE-CAPILLARY: 121 mg/dL — AB (ref 65–99)
Glucose-Capillary: 124 mg/dL — ABNORMAL HIGH (ref 65–99)
Glucose-Capillary: 134 mg/dL — ABNORMAL HIGH (ref 65–99)
Glucose-Capillary: 139 mg/dL — ABNORMAL HIGH (ref 65–99)
Glucose-Capillary: 152 mg/dL — ABNORMAL HIGH (ref 65–99)
Glucose-Capillary: 158 mg/dL — ABNORMAL HIGH (ref 65–99)

## 2017-09-21 MED ORDER — METHYLPREDNISOLONE SODIUM SUCC 40 MG IJ SOLR
40.0000 mg | Freq: Two times a day (BID) | INTRAMUSCULAR | Status: DC
  Administered 2017-09-21 – 2017-09-24 (×6): 40 mg via INTRAVENOUS
  Filled 2017-09-21 (×6): qty 1

## 2017-09-21 NOTE — Progress Notes (Signed)
PULMONARY / CRITICAL CARE MEDICINE   Name: Danielle Stevens MRN: [id number] DOB: May 24, 1970    ADMISSION DATE:  08/22/2017  REFERRING MD:  Dr. Pearlean Brownie, ER  CHIEF COMPLAINT:  Short of breath  HISTORY OF PRESENT ILLNESS:   47 yo female former smoker with A1AT and severe emphysema s/p trach and chronic vent on weekly prolastin transferred from [facility] with altered mental status and hypercapnia.  Found to have MRSE bacteremia with concern for port infection.  PMHx of DM, HTN, Anxiety/depression.  SUBJECTIVE:  She is on th event. Denies any shortness of breath or pain.   VITAL SIGNS: BP 128/86   Pulse 90   Temp 98.3 F (36.8 C) (Oral)   Resp (!) 22   Ht 5\' 4"  (1.626 m)   Wt 83.3 kg (183 lb 10.3 oz)   LMP  (LMP Unknown)   SpO2 98%   BMI 31.52 kg/m   VENTILATOR SETTINGS: Vent Mode: PRVC FiO2 (%):  [40 %] 40 % Set Rate:  [16 bmp] 16 bmp Vt Set:  [480 mL] 480 mL PEEP:  [8 cmH20] 8 cmH20 Plateau Pressure:  [30 cmH20-36 cmH20] 30 cmH20  INTAKE / OUTPUT: I/O last 3 completed shifts: In: 4890 [I.V.:20; Other:190; NG/GT:4680] Out: 1675 [Urine:1675]  PHYSICAL EXAMINATION: General: chronically ill appearing female in NAD on vent HEENT: MM pink/moist, trach midline c/d/i  Neuro: awake/alert, communicates appropriately  CV: s1s2 rrr, no m/r/g PULM: even/non-labored, lungs bilaterally diminished  YT:KPTW, non-tender, bsx4 active  Extremities: warm/dry, no edema  Skin: no rashes or lesions  LABS:  BMET Recent Labs  Lab 09/19/17 0501 09/20/17 0446 09/21/17 0318  NA 142 144 139  K 3.9 3.9 3.9  CL 95* 98* 95*  CO2 36* 36* 35*  BUN 19 20 22*  CREATININE 0.60 0.52 0.55  GLUCOSE 137* 125* 158*    Electrolytes Recent Labs  Lab 09/17/17 0410  09/19/17 0501 09/20/17 0446 09/21/17 0318  CALCIUM 9.5   < > 9.9 9.5 9.3  MG 1.7  --   --   --   --    < > = values in this interval not displayed.    CBC Recent Labs  Lab 09/19/17 0501 09/20/17 0446 09/21/17 0318  WBC 9.8  10.1 14.0*  HGB 10.1* 10.1* 11.2*  HCT 35.9* 35.2* 38.9  PLT 261 270 309    Coag's No results for input(s): APTT, INR in the last 168 hours.  Sepsis Markers No results for input(s): LATICACIDVEN, PROCALCITON, O2SATVEN in the last 168 hours.  ABG Recent Labs  Lab 09/16/17 0630 09/17/17 0330  PHART 7.352 7.479*  PCO2ART 74.4* 49.6*  PO2ART 83.9 147*    Liver Enzymes Recent Labs  Lab 09/16/17 0406 09/17/17 0410  AST 18 24  ALT 16 19  ALKPHOS 50 65  BILITOT 0.6 0.6  ALBUMIN 2.9* 3.2*    Cardiac Enzymes No results for input(s): TROPONINI, PROBNP in the last 168 hours.  Glucose Recent Labs  Lab 09/20/17 1212 09/20/17 1630 09/20/17 2021 09/20/17 2322 09/21/17 0318 09/21/17 0749  GLUCAP 171* 106* 122* 176* 158* 134*    Imaging No results found.  STUDIES:  Echo 4/29 >> EF 55 to 60% CT angio chest 5/21 >> mild atherosclerosis, enlarged PA, centrilobular and paraseptal emphysema with dominant bulla in superior segment LLL, ATX, small b/l effusions  CULTURES: Blood 4/26 >> MRSE  ANTIBIOTICS: Vancomycin 4/27 >> 5/15  C diff 5/01 >> Ag positive, toxin negative, PCR negative Blood 5/01 >> negative  SIGNIFICANT EVENTS: 4/26  Transfer from [facility] 4/29 ID, CCS consulted 4/30 removal of Rt [place] port 5/01 CCS s/o 5/09 Transfuse 1 unit PRBC 5/21 Change sedation, start steroids 5/22 [facility] about transfer for transplant consideration >> not candidate (BMI needs to be < 28, be able to walk 1000 ft, deconditioning)  LINES/TUBES: Trach >>  Lt PICC 5/06 >>   DISCUSSION: She has chronic hypoxic, 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  She is trach/vent dependent, and essentially bed bound.  Does not seem to be any reversible processes that can be treated to improve her current status.  As such she is not a transplant candidate, which would have been her only realistic option for improvement in her quality of life.      ASSESSMENT / PLAN:  Acute on chronic hypoxia/hypercapnic respiratory failure. 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 - PRVC support - O2 for sats 88-95% - wean steroids to q12hrs  - continue pulmicort, duoneb Q4, PRN albuterol  - follow intermittent CXR  - continue Q Friday prolastin   MRSE from infected port. - antibiotics completed 5/15   Chronic Hypotension  - continue midodrine 5mg  TID   Anxiety with depression. - RASS Goal: 0 to -1  - continue klonopin 1mg  TID, doxepin 10mg  QHS, paroxetine 30 mg QD, buspar 15 mg BID, seroquel 100mg  BID, requip 0.5 mg QHS  -PRN fentanyl for pain  DM type II. - CBG with resistant SSI  - Lantus 14 units QHS   Anemia of critical illness and chronic disease. - trend CBC   Hypokalemia. Hypernatremia  - Free water PT  - Trend BMP / urinary output - Replace electrolytes as indicated - Avoid nephrotoxic agents, ensure adequate renal perfusion   DVT prophylaxis - lovenox SUP - pepcid Nutrition - tube feeds Goals of care - full code Global - await bed availability at [facility].  SW called [facility] regarding transfer 5/24 > bed will be available 5/30.

## 2017-09-22 LAB — GLUCOSE, CAPILLARY
GLUCOSE-CAPILLARY: 145 mg/dL — AB (ref 65–99)
GLUCOSE-CAPILLARY: 139 mg/dL — AB (ref 65–99)
GLUCOSE-CAPILLARY: 182 mg/dL — AB (ref 65–99)
Glucose-Capillary: 80 mg/dL (ref 65–99)
Glucose-Capillary: 89 mg/dL (ref 65–99)
Glucose-Capillary: 93 mg/dL (ref 65–99)

## 2017-09-22 NOTE — Progress Notes (Addendum)
PULMONARY / CRITICAL CARE MEDICINE   Name: Danielle Stevens MRN: [id number] DOB: 08-11-1970    ADMISSION DATE:  08/22/2017  REFERRING MD:  Dr. Pearlean Brownie, ER  CHIEF COMPLAINT:  Short of breath  HISTORY OF PRESENT ILLNESS:   47 yo female former smoker with A1AT and severe emphysema s/p trach and chronic vent on weekly prolastin transferred from [facility] with altered mental status and hypercapnia.  Found to have MRSE bacteremia with concern for port infection.  PMHx of DM, HTN, Anxiety/depression.  SUBJECTIVE:  Full vent support, No co pain of SOB  VITAL SIGNS: BP 116/63   Pulse 76   Temp 98 F (36.7 C) (Oral)   Resp (!) 26   Ht 5\' 4"  (1.626 m)   Wt 183 lb 6.8 oz (83.2 kg)   LMP  (LMP Unknown)   SpO2 97%   BMI 31.48 kg/m   VENTILATOR SETTINGS: Vent Mode: PRVC FiO2 (%):  [40 %] 40 % Set Rate:  [16 bmp] 16 bmp Vt Set:  [480 mL] 480 mL PEEP:  [8 cmH20] 8 cmH20 Plateau Pressure:  [26 cmH20-40 cmH20] 26 cmH20  INTAKE / OUTPUT: I/O last 3 completed shifts: In: 3500 [I.V.:20; NG/GT:3480] Out: 1600 [Urine:1600]  PHYSICAL EXAMINATION: General: chronically ill appearing female in NAD, awake and alert HEENT: MM pink/moist, trach secure and midline c/d/i  Neuro: awake/alert, communicates appropriately, MAE x 4, A&O x 3  CV: s1s2 rrr, no m/r/g, NSR with PAC's per tele PULM: even/non-labored, few rhonchi, lungs bilaterally diminished  VQ:QVZD, non-tender, bsx4 active , obese Extremities: warm/dry, no edema , no obvious deformities Skin: no rashes or lesions, warm , dry and intact  LABS:  BMET Recent Labs  Lab 09/19/17 0501 09/20/17 0446 09/21/17 0318  NA 142 144 139  K 3.9 3.9 3.9  CL 95* 98* 95*  CO2 36* 36* 35*  BUN 19 20 22*  CREATININE 0.60 0.52 0.55  GLUCOSE 137* 125* 158*    Electrolytes Recent Labs  Lab 09/17/17 0410  09/19/17 0501 09/20/17 0446 09/21/17 0318  CALCIUM 9.5   < > 9.9 9.5 9.3  MG 1.7  --   --   --   --    < > = values in this interval not  displayed.    CBC Recent Labs  Lab 09/19/17 0501 09/20/17 0446 09/21/17 0318  WBC 9.8 10.1 14.0*  HGB 10.1* 10.1* 11.2*  HCT 35.9* 35.2* 38.9  PLT 261 270 309    Coag's No results for input(s): APTT, INR in the last 168 hours.  Sepsis Markers No results for input(s): LATICACIDVEN, PROCALCITON, O2SATVEN in the last 168 hours.  ABG Recent Labs  Lab 09/16/17 0630 09/17/17 0330  PHART 7.352 7.479*  PCO2ART 74.4* 49.6*  PO2ART 83.9 147*    Liver Enzymes Recent Labs  Lab 09/16/17 0406 09/17/17 0410  AST 18 24  ALT 16 19  ALKPHOS 50 65  BILITOT 0.6 0.6  ALBUMIN 2.9* 3.2*    Cardiac Enzymes No results for input(s): TROPONINI, PROBNP in the last 168 hours.  Glucose Recent Labs  Lab 09/21/17 1624 09/21/17 1937 09/21/17 2330 09/22/17 0330 09/22/17 0808 09/22/17 1126  GLUCAP 121* 124* 139* 145* 139* 182*    Imaging No results found.  STUDIES:  Echo 4/29 >> EF 55 to 60% CT angio chest 5/21 >> mild atherosclerosis, enlarged PA, centrilobular and paraseptal emphysema with dominant bulla in superior segment LLL, ATX, small b/l effusions  CULTURES: Blood 4/26 >> MRSE  ANTIBIOTICS: Vancomycin 4/27 >> 5/15  C diff 5/01 >> Ag positive, toxin negative, PCR negative Blood 5/01 >> negative  SIGNIFICANT EVENTS: 4/26 Transfer from [facility] 4/29 ID, CCS consulted 4/30 removal of Rt [place] port 5/01 CCS s/o 5/09 Transfuse 1 unit PRBC 5/21 Change sedation, start steroids 5/22 [facility] called about transfer for transplant consideration >> not candidate (BMI needs to be < 28, be able to walk 1000 ft, deconditioning)  LINES/TUBES: Trach >>  Lt PICC 5/06 >>   DISCUSSION: She has chronic hypoxic, 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  She is trach/vent dependent, and essentially bed bound.  Does not seem to be any reversible processes that can be treated to improve her current status.  As such she is not a transplant candidate,  which would have been her only realistic option for improvement in her quality of life.     ASSESSMENT / PLAN:  Acute on chronic hypoxia/hypercapnic respiratory failure. 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 - PRVC support - O2 for sats 88-95% - wean steroids to q12hrs  - continue pulmicort, duoneb Q4, PRN albuterol  - follow intermittent CXR  - continue Q Friday prolastin   MRSE from infected port. Leukocytosis ? Steroid related - antibiotics completed 5/15  - Trend CBC and Fever Curve  - Culture as is clinically indicated  Chronic Hypotension  - continue midodrine 5mg  TID   Anxiety with depression. - RASS Goal: 0 to -1  - continue klonopin 1mg  TID, doxepin 10mg  QHS, paroxetine 30 mg QD, buspar 15 mg BID, seroquel 100mg  BID, requip 0.5 mg QHS  -PRN fentanyl for pain - QTc monitoring  DM type II. - CBG with resistant SSI  - Lantus 14 units QHS   Anemia of critical illness and chronic disease. - trend CBC  - Monitor for obvious bleeding - Transfuse for HGB < 7  Hypokalemia. Hypernatremia  - Free water PT  - Trend BMP / urinary output - Replace electrolytes as indicated - Avoid nephrotoxic agents, ensure adequate renal perfusion   DVT prophylaxis - lovenox SUP - pepcid Nutrition - tube feeds Goals of care - full code Global - await bed availability at [facility].  SW called [facility] regarding transfer 5/24 > bed will be available 5/30.    Bevelyn Ngo, AGACNP-BC [place] [facility] Pager [phone]  09/22/2017 1:20 PM

## 2017-09-23 LAB — BASIC METABOLIC PANEL
Anion gap: 7 (ref 5–15)
BUN: 20 mg/dL (ref 6–20)
CO2: 32 mmol/L (ref 22–32)
CREATININE: 0.66 mg/dL (ref 0.44–1.00)
Calcium: 8.1 mg/dL — ABNORMAL LOW (ref 8.9–10.3)
Chloride: 103 mmol/L (ref 101–111)
GFR calc Af Amer: >60 mL/min (ref >60)
GLUCOSE: 117 mg/dL — AB (ref 65–99)
Potassium: 3.2 mmol/L — ABNORMAL LOW (ref 3.5–5.1)
Sodium: 142 mmol/L (ref 135–145)

## 2017-09-23 LAB — GLUCOSE, CAPILLARY
GLUCOSE-CAPILLARY: 130 mg/dL — AB (ref 65–99)
Glucose-Capillary: 111 mg/dL — ABNORMAL HIGH (ref 65–99)
Glucose-Capillary: 209 mg/dL — ABNORMAL HIGH (ref 65–99)
Glucose-Capillary: 175 mg/dL — ABNORMAL HIGH (ref 65–99)
Glucose-Capillary: 121 mg/dL — ABNORMAL HIGH (ref 65–99)
Glucose-Capillary: 142 mg/dL — ABNORMAL HIGH (ref 65–99)

## 2017-09-23 LAB — CBC
HCT: 36.6 % (ref 36.0–46.0)
Hemoglobin: 10.6 g/dL — ABNORMAL LOW (ref 12.0–15.0)
MCH: 25.2 pg — AB (ref 26.0–34.0)
MCHC: 29 g/dL — AB (ref 30.0–36.0)
MCV: 87.1 fL (ref 78.0–100.0)
PLATELETS: 234 10*3/uL (ref 150–400)
RBC: 4.2 MIL/uL (ref 3.87–5.11)
RDW: 17.9 % — AB (ref 11.5–15.5)
WBC: 13.1 10*3/uL — ABNORMAL HIGH (ref 4.0–10.5)

## 2017-09-23 MED ORDER — POTASSIUM CHLORIDE 10 MEQ/50ML IV SOLN
10.0000 meq | INTRAVENOUS | Status: AC
  Administered 2017-09-23 (×4): 10 meq via INTRAVENOUS
  Filled 2017-09-23 (×4): qty 50

## 2017-09-23 NOTE — Clinical Social Work Note (Signed)
Follow-up call made to [facility] with [facility] [phone]) to confirm that they will still be able to take patient on the 30th. Per Misty Stanley patient can come to them on this date. CSW will continue to follow and contact [facility] on 5/30 regarding patient's discharge.  Genelle Bal, MSW, LCSW Licensed Clinical Social Worker Clinical Social Work Department [employer] [phone]

## 2017-09-23 NOTE — Progress Notes (Signed)
eLink Physician-Brief Progress Note Patient Name: Danielle Stevens DOB: 02-16-1971 MRN: [id number]   Date of Service  09/23/2017  HPI/Events of Note  K+ = 3.2 and Creatinine = 0.66.  eICU Interventions  Will replace K+.     Intervention Category Major Interventions: Electrolyte abnormality - evaluation and management  [name],[name] 09/23/2017, 5:46 AM

## 2017-09-23 NOTE — Progress Notes (Signed)
Physical Therapy Treatment Patient Details Name: Danielle Stevens MRN: [id number] DOB: May 02, 1970 Today's Date: 09/23/2017    History of Present Illness Pt is a 47 y.o. female with alpha-1 antitrypsin deficiency and has ventilator dependent respiratory failure, admitted from [facility] on 08/22/17 due to sudden unresponsiveness. Found to have worsening acidosis in setting of mucous plug from purulent bronchitis; also with MRSE bacteremia and hypotension. PMH includes anxiety, asthma, COPD, DM, emphysema lung, HTN. Of note, recently admitted 05/16/17-06/03/17 for COPD exacerbation requiring intubation; tracheostomy done later with d/c to SNF [facility].    PT Comments    Pt slowly progressing with mobility. Required max encouragement for participation with PT this session. Performed bed mobility with modA. Today's session focused on sitting balance as pt adamantly refusing OOB mobility. Heavy reliance on BUE support sitting EOB, able to perform multiple bouts of sitting with no UE support for ~10sec at a time; completed minimal BLE therex while seated. Pt very anxious throughout and potentially self-limiting. Increased WOB and requesting RN to suction; no secretions present. HR up to 130s while sitting. Will continue to follow acutely.   Follow Up Recommendations  SNF     Equipment Recommendations  None recommended by PT    Recommendations for Other Services       Precautions / Restrictions Precautions Precautions: Fall Precaution Comments: trach/vent, peg tube, high anxiety Restrictions Weight Bearing Restrictions: No    Mobility  Bed Mobility Overal bed mobility: Needs Assistance       Supine to sit: Mod assist;+2 for safety/equipment     General bed mobility comments: Max encouragement to sit EOB; pt demonstrating improved core strength, requiring modA for UE support to assist trunk elevation and scoot hips to EOB. Able to assist more with cues for sequencing  Transfers                  General transfer comment: Pt adamantly declining use of [place] or any sort of standing/OOB transfer. After sitting EOB >10 minutes, becoming very anxious, asking RN for suction (RN suctioned with no secretions present), and c/o nausea. Eventually DOE 3/4 and returned to supine  Ambulation/Gait             General Gait Details: unable   Stairs             Wheelchair Mobility    Modified Rankin (Stroke Patients Only)       Balance Overall balance assessment: Needs assistance Sitting-balance support: Feet supported;No upper extremity supported;Bilateral upper extremity supported;Single extremity supported Sitting balance-Leahy Scale: Fair Sitting balance - Comments: Pt with heavy reliance on BUE support while sitting (potentially self-limiting); able to sit with no UE support for 10-sec increments although complaining and reports "shakiness" (max encouragement to participate in sitting balance with no UE support)                                    Cognition Arousal/Alertness: Awake/alert Behavior During Therapy: Anxious Overall Cognitive Status: Difficult to assess                                 General Comments: Pt following commands appropriate; nods yes/no and verbalizes answers when possible (also able to write when this PT could not understand)      Exercises General Exercises - Lower Extremity Long Arc Quad: AROM;Both;10 reps;Seated  General Comments        Pertinent Vitals/Pain Pain Assessment: Faces Faces Pain Scale: Hurts little more Pain Location: Stomach (nausea) Pain Descriptors / Indicators: Discomfort;Grimacing Pain Intervention(s): Monitored during session;Limited activity within patient's tolerance    Home Living                      Prior Function            PT Goals (current goals can now be found in the care plan section) Acute Rehab PT Goals Patient Stated Goal: not stated PT  Goal Formulation: With patient Time For Goal Achievement: 09/30/17 Potential to Achieve Goals: Fair Progress towards PT goals: Progressing toward goals    Frequency    Min 2X/week      PT Plan Current plan remains appropriate    Co-evaluation              AM-PAC PT "6 Clicks" Daily Activity  Outcome Measure  Difficulty turning over in bed (including adjusting bedclothes, sheets and blankets)?: Unable Difficulty moving from lying on back to sitting on the side of the bed? : Unable Difficulty sitting down on and standing up from a chair with arms (e.g., wheelchair, bedside commode, etc,.)?: Unable Help needed moving to and from a bed to chair (including a wheelchair)?: Total Help needed walking in hospital room?: Total Help needed climbing 3-5 steps with a railing? : Total 6 Click Score: 6    End of Session Equipment Utilized During Treatment: Oxygen(vent/trach) Activity Tolerance: Patient tolerated treatment well;Other (comment)(Limited by anxiety) Patient left: in bed;with call bell/phone within reach Nurse Communication: Mobility status;Need for lift equipment PT Visit Diagnosis: Other abnormalities of gait and mobility (R26.89);Muscle weakness (generalized) (M62.81)     Time: 1300-1330 PT Time Calculation (min) (ACUTE ONLY): 30 min  Charges:  $Therapeutic Exercise: 8-22 mins $Therapeutic Activity: 8-22 mins                    G Codes:      [name], PT, DPT Acute Rehab Services  Pager: [phone]  [name] 09/23/2017, 2:25 PM

## 2017-09-23 NOTE — Progress Notes (Signed)
PULMONARY / CRITICAL CARE MEDICINE   Name: Danielle Stevens MRN: [id number] DOB: 1970-05-19    ADMISSION DATE:  08/22/2017  REFERRING MD:  Dr. Pearlean Brownie, ER  CHIEF COMPLAINT:  Short of breath  HISTORY OF PRESENT ILLNESS:   47 yo female former smoker with A1AT and severe emphysema s/p trach and chronic vent on weekly prolastin transferred from [facility] with altered mental status and hypercapnia.  Found to have MRSE bacteremia with concern for port infection.  PMHx of DM, HTN, Anxiety/depression.  SUBJECTIVE:  Had nausea and belching this AM.  Tube feeds turned off, given zofran, had relief. Pt not ready for tube feeds to be resumed yet.  VITAL SIGNS: BP 134/85   Pulse (!) 104   Temp 98.6 F (37 C) (Oral)   Resp 20   Ht 5\' 4"  (1.626 m)   Wt 84 kg (185 lb 3 oz)   LMP  (LMP Unknown)   SpO2 94%   BMI 31.79 kg/m   VENTILATOR SETTINGS: Vent Mode: PRVC FiO2 (%):  [40 %] 40 % Set Rate:  [16 bmp] 16 bmp Vt Set:  [480 mL] 480 mL PEEP:  [8 cmH20] 8 cmH20 Plateau Pressure:  [20 cmH20-23 cmH20] 20 cmH20  INTAKE / OUTPUT: I/O last 3 completed shifts: In: 3070 [I.V.:20; NG/GT:3000; IV Piggyback:50] Out: 1653 [Urine:1651; Stool:2]  PHYSICAL EXAMINATION: General: chronically ill appearing female in NAD, awake and alert HEENT: MM pink/moist, trach secure and midline c/d/i  Neuro: A&O x 3, communicates appropriately, MAE x 4 CV: s1s2 rrr, no m/r/g PULM: even/non-labored, few rhonchi, lungs bilaterally diminished  VF:IEPP, non-tender, bsx4 active , obese Extremities: warm/dry, no edema , no obvious deformities Skin: no rashes or lesions, warm , dry and intact  LABS:  BMET Recent Labs  Lab 09/20/17 0446 09/21/17 0318 09/23/17 0356  NA 144 139 142  K 3.9 3.9 3.2*  CL 98* 95* 103  CO2 36* 35* 32  BUN 20 22* 20  CREATININE 0.52 0.55 0.66  GLUCOSE 125* 158* 117*    Electrolytes Recent Labs  Lab 09/17/17 0410  09/20/17 0446 09/21/17 0318 09/23/17 0356  CALCIUM 9.5   < > 9.5  9.3 8.1*  MG 1.7  --   --   --   --    < > = values in this interval not displayed.    CBC Recent Labs  Lab 09/20/17 0446 09/21/17 0318 09/23/17 0356  WBC 10.1 14.0* 13.1*  HGB 10.1* 11.2* 10.6*  HCT 35.2* 38.9 36.6  PLT 270 309 234    Coag's No results for input(s): APTT, INR in the last 168 hours.  Sepsis Markers No results for input(s): LATICACIDVEN, PROCALCITON, O2SATVEN in the last 168 hours.  ABG Recent Labs  Lab 09/17/17 0330  PHART 7.479*  PCO2ART 49.6*  PO2ART 147*    Liver Enzymes Recent Labs  Lab 09/17/17 0410  AST 24  ALT 19  ALKPHOS 65  BILITOT 0.6  ALBUMIN 3.2*    Cardiac Enzymes No results for input(s): TROPONINI, PROBNP in the last 168 hours.  Glucose Recent Labs  Lab 09/22/17 1534 09/22/17 1952 09/22/17 2351 09/23/17 0355 09/23/17 0717 09/23/17 1118  GLUCAP 89 80 93 111* 209* 175*    Imaging No results found.  STUDIES:  Echo 4/29 >> EF 55 to 60% CT angio chest 5/21 >> mild atherosclerosis, enlarged PA, centrilobular and paraseptal emphysema with dominant bulla in superior segment LLL, ATX, small b/l effusions  CULTURES: Blood 4/26 >> MRSE  ANTIBIOTICS: Vancomycin 4/27 >>  5/15  C diff 5/01 >> Ag positive, toxin negative, PCR negative Blood 5/01 >> negative  SIGNIFICANT EVENTS: 4/26 Transfer from [facility] 4/29 ID, CCS consulted 4/30 removal of Rt [place] port 5/01 CCS s/o 5/09 Transfuse 1 unit PRBC 5/21 Change sedation, start steroids 5/22 [facility] and [facility] called about transfer for transplant consideration >> not candidate (BMI needs to be < 28, be able to walk 1000 ft, deconditioning)  LINES/TUBES: Trach >>  Lt PICC 5/06 >>   DISCUSSION: She has chronic hypoxic, 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  She is trach/vent dependent, and essentially bed bound.  Does not seem to be any reversible processes that can be treated to improve her current status.  As such she is not a transplant  candidate, which would have been her only realistic option for improvement in her quality of life.     ASSESSMENT / PLAN:  Acute on chronic hypoxia/hypercapnic respiratory failure. 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 - PRVC support, no weaning as does not tolerate - O2 for sats 88-95% - Continue steroids q12hrs  - continue pulmicort, duoneb Q4, PRN albuterol  - follow intermittent CXR  - continue Q Friday prolastin   MRSE from infected port - s/p abx course (completed 5/15) Leukocytosis ? Steroid related - Follow CBC and Fever Curve  - Culture as is clinically indicated  Chronic Hypotension. - continue midodrine 5mg  TID   Anxiety with depression. - RASS Goal: 0 to -1  - continue klonopin 1mg  TID, doxepin 10mg  QHS, paroxetine 30 mg QD, buspar 15 mg BID, seroquel 100mg  BID, requip 0.5 mg QHS  -PRN fentanyl for pain - QTc monitoring  DM type II. - CBG with resistant SSI  - Lantus 14 units QHS   Anemia of critical illness and chronic disease. - trend CBC  - Monitor for obvious bleeding - Transfuse for HGB < 7  Hypokalemia. Hypernatremia  - Free water PT  - Trend BMP / urinary output - Replace electrolytes as indicated - Avoid nephrotoxic agents, ensure adequate renal perfusion  Nausea - improving gradually. - Continue to hold tube feeds for now, pt not ready to restart. - Continue zofran PRN. - if has recurrence, can try lower tube feed rate.  DVT prophylaxis - lovenox SUP - pepcid Nutrition - tube feeds Goals of care - full code Global - await bed availability at [facility].  SW called [facility] regarding transfer 5/24 > bed will be available 5/30.     Rutherford Guys, [place] - [facility] [place] [facility] Pager: [phone]  or [phone] 09/23/2017, 11:31 AM

## 2017-09-24 LAB — GLUCOSE, CAPILLARY
GLUCOSE-CAPILLARY: 102 mg/dL — AB (ref 65–99)
Glucose-Capillary: 207 mg/dL — ABNORMAL HIGH (ref 65–99)
Glucose-Capillary: 141 mg/dL — ABNORMAL HIGH (ref 65–99)
Glucose-Capillary: 138 mg/dL — ABNORMAL HIGH (ref 65–99)
Glucose-Capillary: 148 mg/dL — ABNORMAL HIGH (ref 65–99)

## 2017-09-24 MED ORDER — PREDNISONE 20 MG PO TABS
40.0000 mg | ORAL_TABLET | Freq: Every day | ORAL | Status: DC
  Administered 2017-09-25: 40 mg
  Filled 2017-09-24 (×3): qty 2

## 2017-09-24 MED ORDER — METHYLPREDNISOLONE SODIUM SUCC 40 MG IJ SOLR: 40.0000 mg | Freq: Every day | INTRAMUSCULAR | Status: DC

## 2017-09-24 NOTE — Progress Notes (Signed)
PULMONARY / CRITICAL CARE MEDICINE   Name: Danielle Stevens MRN: [id number] DOB: 1970/12/12    ADMISSION DATE:  08/22/2017  REFERRING MD:  Dr. Pearlean Brownie, ER  CHIEF COMPLAINT:  Short of breath  HISTORY OF PRESENT ILLNESS:   47 yo female former smoker with A1AT and severe emphysema s/p trach and chronic vent on weekly prolastin transferred from [facility] with altered mental status and hypercapnia.  Found to have MRSE bacteremia with concern for port infection.  PMHx of DM, HTN, Anxiety/depression.  SUBJECTIVE: Back on tube feeds no acute distress  VITAL SIGNS: BP 110/67   Pulse 93   Temp 98.3 F (36.8 C) (Oral)   Resp 17   Ht 5\' 4"  (1.626 m)   Wt 83.5 kg (184 lb 1.4 oz)   LMP  (LMP Unknown)   SpO2 99%   BMI 31.60 kg/m   VENTILATOR SETTINGS: Vent Mode: PRVC FiO2 (%):  [40 %] 40 % Set Rate:  [16 bmp] 16 bmp Vt Set:  [480 mL] 480 mL PEEP:  [8 cmH20] 8 cmH20 Plateau Pressure:  [17 cmH20-22 cmH20] 22 cmH20  INTAKE / OUTPUT: I/O last 3 completed shifts: In: 1495 [I.V.:30; Other:60; NG/GT:1255; IV Piggyback:150] Out: 2900 [Urine:2900]  PHYSICAL EXAMINATION: General: Chronically ill female is awake alert HEENT: Trach to vent Neuro: Intact follows commands, anxious at times CV: s1s2 rrr, no m/r/g PULM: even/non-labored, lungs bilaterally decreased at bases JX:BJYN, non-tender, bsx4 active PEG in place Extremities: warm/dry, 1+ edema  Skin: no rashes or lesions   LABS:  BMET Recent Labs  Lab 09/20/17 0446 09/21/17 0318 09/23/17 0356  NA 144 139 142  K 3.9 3.9 3.2*  CL 98* 95* 103  CO2 36* 35* 32  BUN 20 22* 20  CREATININE 0.52 0.55 0.66  GLUCOSE 125* 158* 117*    Electrolytes Recent Labs  Lab 09/20/17 0446 09/21/17 0318 09/23/17 0356  CALCIUM 9.5 9.3 8.1*    CBC Recent Labs  Lab 09/20/17 0446 09/21/17 0318 09/23/17 0356  WBC 10.1 14.0* 13.1*  HGB 10.1* 11.2* 10.6*  HCT 35.2* 38.9 36.6  PLT 270 309 234    Coag's No results for input(s): APTT, INR  in the last 168 hours.  Sepsis Markers No results for input(s): LATICACIDVEN, PROCALCITON, O2SATVEN in the last 168 hours.  ABG No results for input(s): PHART, PCO2ART, PO2ART in the last 168 hours.  Liver Enzymes No results for input(s): AST, ALT, ALKPHOS, BILITOT, ALBUMIN in the last 168 hours.  Cardiac Enzymes No results for input(s): TROPONINI, PROBNP in the last 168 hours.  Glucose Recent Labs  Lab 09/23/17 1118 09/23/17 1509 09/23/17 1942 09/23/17 2329 09/24/17 0406 09/24/17 0743  GLUCAP 175* 121* 142* 130* 207* 141*    Imaging No results found.  STUDIES:  Echo 4/29 >> EF 55 to 60% CT angio chest 5/21 >> mild atherosclerosis, enlarged PA, centrilobular and paraseptal emphysema with dominant bulla in superior segment LLL, ATX, small b/l effusions  CULTURES: Blood 4/26 >> MRSE  ANTIBIOTICS: Vancomycin 4/27 >> 5/15  C diff 5/01 >> Ag positive, toxin negative, PCR negative Blood 5/01 >> negative  SIGNIFICANT EVENTS: 4/26 Transfer from [facility] 4/29 ID, CCS consulted 4/30 removal of Rt [place] port 5/01 CCS s/o 5/09 Transfuse 1 unit PRBC 5/21 Change sedation, start steroids 5/22 [facility] called about transfer for transplant consideration >> not candidate (BMI needs to be < 28, be able to walk 1000 ft, deconditioning)  LINES/TUBES: Trach >>  Lt PICC 5/06 >>   DISCUSSION: She  has chronic hypoxic, 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  She is trach/vent dependent, and essentially bed bound.  Does not seem to be any reversible processes that can be treated to improve her current status.  As such she is not a transplant candidate, which would have been her only realistic option for improvement in her quality of life.     ASSESSMENT / PLAN:  Acute on chronic hypoxia/hypercapnic respiratory failure. 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  Continue current ventilator  settings Bronchodilators as needed Currently on steroids every 12 hours 09/24/2017 decreased to 40 mg daily Continue Pulmicort DuoNeb Intermittent chest x-ray Continue Prolastin every Friday Return to [facility] plan soon  MRSE from infected port - s/p abx course (completed 5/15) Leukocytosis ? Steroid related  Follow fever curve and [place] count Treatment as needed  Chronic Hypotension.  Currently on Midrin 5 mg 3 times daily  Anxiety with depression.  Richard Anderson sedation scale of 0 to -1 Continue current medications PRN fentanyl QTC monitoring  DM type II. CBG (last 3)  Recent Labs    09/23/17 2329 09/24/17 0406 09/24/17 0743  GLUCAP 130* 207* 141*   Continue sliding-scale insulin Continue 14 units nightly of Lantus  Anemia of critical illness and chronic disease. Recent Labs    09/23/17 0356  HGB 10.6*  Transfuse per protocol   Hypokalemia. Hypernatremia  Recent Labs  Lab 09/20/17 0446 09/21/17 0318 09/23/17 0356  K 3.9 3.9 3.2*   Recent Labs  Lab 09/20/17 0446 09/21/17 0318 09/23/17 0356  NA 144 139 142   Replete electrolytes as needed Monitor electrolytes  Nausea - improving gradually.  Tube feeds currently infusing Nausea is better  DVT prophylaxis - lovenox SUP - pepcid Nutrition - tube feeds Goals of care - full code Global - await bed availability [facility].  SW called [facility] regarding transfer 5/24 > bed will be available 5/30.     Brett Canales Aneshia Jacquet ACNP [facility] PCCM Pager [phone] till 1 pm If no answer page [phone] 09/24/2017, 8:36 AM

## 2017-09-25 ENCOUNTER — Inpatient Hospital Stay (HOSPITAL_COMMUNITY): Payer: Medicare Other

## 2017-09-25 LAB — BASIC METABOLIC PANEL
ANION GAP: 10 (ref 5–15)
BUN: 16 mg/dL (ref 6–20)
CALCIUM: 9 mg/dL (ref 8.9–10.3)
CO2: 33 mmol/L — AB (ref 22–32)
CREATININE: 0.57 mg/dL (ref 0.44–1.00)
Chloride: 100 mmol/L — ABNORMAL LOW (ref 101–111)
GFR calc Af Amer: >60 mL/min (ref >60)
GFR calc non Af Amer: >60 mL/min (ref >60)
GLUCOSE: 99 mg/dL (ref 65–99)
Potassium: 3.6 mmol/L (ref 3.5–5.1)
Sodium: 143 mmol/L (ref 135–145)

## 2017-09-25 LAB — GLUCOSE, CAPILLARY
GLUCOSE-CAPILLARY: 119 mg/dL — AB (ref 65–99)
GLUCOSE-CAPILLARY: 80 mg/dL (ref 65–99)
Glucose-Capillary: 146 mg/dL — ABNORMAL HIGH (ref 65–99)
Glucose-Capillary: 78 mg/dL (ref 65–99)

## 2017-09-25 LAB — CBC
HEMATOCRIT: 33.1 % — AB (ref 36.0–46.0)
Hemoglobin: 9.5 g/dL — ABNORMAL LOW (ref 12.0–15.0)
MCH: 25.1 pg — AB (ref 26.0–34.0)
MCHC: 28.7 g/dL — ABNORMAL LOW (ref 30.0–36.0)
MCV: 87.3 fL (ref 78.0–100.0)
Platelets: 247 10*3/uL (ref 150–400)
RBC: 3.79 MIL/uL — ABNORMAL LOW (ref 3.87–5.11)
RDW: 17.6 % — AB (ref 11.5–15.5)
WBC: 8.7 10*3/uL (ref 4.0–10.5)

## 2017-09-25 LAB — PHOSPHORUS: Phosphorus: 3.3 mg/dL (ref 2.5–4.6)

## 2017-09-25 LAB — MAGNESIUM: Magnesium: 2 mg/dL (ref 1.7–2.4)

## 2017-09-25 MED ORDER — VITAL AF 1.2 CAL PO LIQD: 1000.0000 mL | ORAL | Status: DC

## 2017-09-25 MED ORDER — INSULIN ASPART 100 UNIT/ML ~~LOC~~ SOLN: 0.0000 [IU] | SUBCUTANEOUS | 11 refills | Status: DC

## 2017-09-25 MED ORDER — MIDODRINE HCL 5 MG PO TABS: 5.0000 mg | ORAL_TABLET | Freq: Three times a day (TID) | ORAL | Status: DC

## 2017-09-25 MED ORDER — BUDESONIDE 0.5 MG/2ML IN SUSP: 0.5000 mg | Freq: Two times a day (BID) | RESPIRATORY_TRACT | 12 refills | Status: DC

## 2017-09-25 MED ORDER — PREDNISONE 20 MG PO TABS: 40.0000 mg | ORAL_TABLET | Freq: Every day | ORAL | Status: DC

## 2017-09-25 MED ORDER — OXYCODONE HCL 5 MG/5ML PO SOLN: 5.0000 mg | ORAL | 0 refills | Status: DC | PRN

## 2017-09-25 MED ORDER — IPRATROPIUM-ALBUTEROL 0.5-2.5 (3) MG/3ML IN SOLN: 3.0000 mL | RESPIRATORY_TRACT | Status: DC

## 2017-09-25 MED ORDER — EMPTY CONTAINERS FLEXIBLE MISC: 4964.0000 mg | Status: DC

## 2017-09-25 MED ORDER — FREE WATER: 300.0000 mL | Status: DC

## 2017-09-25 MED ORDER — ALBUTEROL SULFATE (2.5 MG/3ML) 0.083% IN NEBU: 2.5000 mg | INHALATION_SOLUTION | RESPIRATORY_TRACT | 12 refills | Status: DC | PRN

## 2017-09-25 MED ORDER — QUETIAPINE FUMARATE 100 MG PO TABS: 100.0000 mg | ORAL_TABLET | Freq: Two times a day (BID) | ORAL | Status: AC

## 2017-09-25 MED ORDER — INSULIN GLARGINE 100 UNIT/ML ~~LOC~~ SOLN: 14.0000 [IU] | Freq: Every day | SUBCUTANEOUS | 11 refills | Status: DC

## 2017-09-25 MED ORDER — ACETAMINOPHEN 160 MG/5ML PO SOLN: 500.0000 mg | Freq: Four times a day (QID) | ORAL | 0 refills | Status: DC | PRN

## 2017-09-25 MED FILL — Alpha1-Proteinase Inhibitor (Human) For IV Soln 1000 MG: INTRAVENOUS | Qty: 4964 | Status: AC

## 2017-09-25 NOTE — Discharge Summary (Signed)
Physician Discharge Summary  Patient ID: Danielle Stevens MRN: [id number] DOB/AGE: [birth year] 47 y.o.  Admit date: 08/22/2017 Discharge date: 09/25/2017  Problem List Active Problems:   Hypercarbia   Acute on chronic respiratory failure with hypoxia and hypercapnia (HCC)   COPD exacerbation (HCC)   VAP (ventilator-associated pneumonia) (HCC)   Hypotension   Hypoxemia   Tracheostomy status (HCC)   Alpha-1-antitrypsin deficiency (HCC)   Goals of care, counseling/discussion   Palliative care by specialist   Generalized anxiety disorder  HPI:  This is a very unfortunate 47 year old who suffers from alpha-1 antitrypsin deficiency and has ventilator dependent respiratory failure.  At [facility] earlier today she was suddenly unresponsive and EMS was called.  She had minimal tracheal secretions.  She was suctioning given several nebulizer treatments in route to the hospital for her initial blood gas showed a pH of 7.16 a PCO2 of 114 and a PO2 of 249.  She is now awake and interactive and tells me that her breathing seems to be at baseline.  She denies any recent change in cough she denies any chest pain.    Hospital Course:  ASSESSMENT / PLAN:  Acute on chronic hypoxia/hypercapnic respiratory failure. 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  Continue current ventilator settings Bronchodilators as needed Currently on steroids every 12 hours 09/24/2017 decreased to 40 mg daily, can taper to off over 2 weeks Continue Pulmicort DuoNeb Intermittent chest x-ray Continue Prolastin every Friday Return to [facility] plan soon  MRSE from infected port - s/p abx course (completed 5/15) Leukocytosis ? Steroid related  Follow fever curve and [place] count Treatment as needed  Chronic Hypotension.  Currently on Midrin 5 mg 3 times daily  Anxiety with depression.  Richard Anderson sedation scale of 0 to -1 Continue current  medications PRN fentanyl QTC monitoring  DM type II. CBG (last 3)  RecentLabs(last2labs)       Recent Labs    09/23/17 2329 09/24/17 0406 09/24/17 0743  GLUCAP 130* 207* 141*     Continue sliding-scale insulin Continue 14 units nightly of Lantus  Anemia of critical illness and chronic disease. RecentLabs(last2labs)     Recent Labs    09/23/17 0356  HGB 10.6*    Transfuse per protocol   Hypokalemia. Hypernatremia  LastLabs       Recent Labs  Lab 09/20/17 0446 09/21/17 0318 09/23/17 0356  K 3.9 3.9 3.2*     LastLabs       Recent Labs  Lab 09/20/17 0446 09/21/17 0318 09/23/17 0356  NA 144 139 142     Replete electrolytes as needed Monitor electrolytes  Nausea - improving gradually.  Tube feeds currently infusing Nausea is better  DVT prophylaxis - lovenox SUP - pepcid Nutrition - tube feeds Goals of care - full code Global - await bed availability at [facility].  SW called [facility] regarding transfer 5/24 > bed will be available 5/30.          Labs at discharge Lab Results  Component Value Date   CREATININE 0.57 09/25/2017   BUN 16 09/25/2017   NA 143 09/25/2017   K 3.6 09/25/2017   CL 100 (L) 09/25/2017   CO2 33 (H) 09/25/2017   Lab Results  Component Value Date   WBC 8.7 09/25/2017   HGB 9.5 (L) 09/25/2017   HCT 33.1 (L) 09/25/2017   MCV 87.3 09/25/2017   PLT 247 09/25/2017   Lab Results  Component Value Date   ALT 19 09/17/2017   AST 24 09/17/2017   ALKPHOS 65 09/17/2017   BILITOT 0.6 09/17/2017   Lab Results  Component Value Date   INR 1.07 08/22/2017    Current radiology studies Dg Chest Port 1 View  Result Date: 09/25/2017 CLINICAL DATA:  47 year old female with respiratory failure. Chronic lung disease. EXAM: PORTABLE CHEST 1 VIEW COMPARISON:  09/18/2017 and earlier. FINDINGS: Portable AP semi upright view at 0324 hours. Intubated. Stable endotracheal tube tip at the level the  clavicles. Left side PICC line is stable. No enteric tube. Stable large lung volumes. Bullous emphysema most apparent in the left mid lung. Subtle reticulonodular opacity about the right hilum. No pneumothorax, pleural effusion or other acute pulmonary opacity. Mediastinal contours remain normal. IMPRESSION: 1.  Stable lines and tubes. 2. Emphysema (ICD10-J43.9) with suspected multifocal acute infectious exacerbation in the right lung. No pleural effusion or consolidation. Electronically Signed   By: Odessa Fleming M.D.   On: 09/25/2017 07:54    Disposition:  Transfer back to [facility]       Discharged Condition: fair  Time spent on discharge greater than 40 minutes.  Vital signs at Discharge. Temp:  [98.2 F (36.8 C)-98.8 F (37.1 C)] 98.8 F (37.1 C) (05/30 0809) Pulse Rate:  [64-101] 72 (05/30 1000) Resp:  [12-34] 16 (05/30 1000) BP: (100-143)/(61-87) 103/61 (05/30 1000) SpO2:  [95 %-100 %] 97 % (05/30 1000) FiO2 (%):  [40 %] 40 % (05/30 0809) Weight:  [84.2 kg (185 lb 10 oz)] 84.2 kg (185 lb 10 oz) (05/30 0500) Office follow up Special Information or instructions. Per [facility] Signed: Brett Canales Minor ACNP [facility] PCCM Pager [phone] till 1 pm If no answer page [phone] 09/25/2017, 10:41 AM  Physician Statement:   The Patient was personally examined, the discharge assessment and plan has been personally reviewed and I agree with ACNP Minor's assessment and plan. > 30 minutes of time have been dedicated to discharge assessment, planning and discharge instructions.   Comer Locket Vassie Loll MD

## 2017-09-25 NOTE — Clinical Social Work Note (Addendum)
Patient will be discharged to [facility], room 306 today, transported by [facility]. Discharge clinicals transmitted to facility by CSW and transport arranged by nursing. CSW signing off as d/c arrangements in place for patient.  Genelle Bal, MSW, LCSW Licensed Clinical Social Worker Clinical Social Work Department [employer] [phone]

## 2017-10-14 ENCOUNTER — Emergency Department (HOSPITAL_COMMUNITY): Payer: Medicare Other

## 2017-10-14 ENCOUNTER — Encounter (HOSPITAL_COMMUNITY): Payer: Self-pay

## 2017-10-14 ENCOUNTER — Inpatient Hospital Stay (HOSPITAL_COMMUNITY): Payer: Medicare Other

## 2017-10-14 ENCOUNTER — Inpatient Hospital Stay (HOSPITAL_COMMUNITY)
Admission: EM | Admit: 2017-10-14 | Discharge: 2017-10-20 | DRG: 207 | Disposition: A | Discharge status: Skilled Nursing Facility | Payer: Medicare Other | Source: Skilled Nursing Facility | Attending: Internal Medicine | Admitting: Internal Medicine

## 2017-10-14 DIAGNOSIS — J189 Pneumonia, unspecified organism: Secondary | ICD-10-CM | POA: Diagnosis not present

## 2017-10-14 DIAGNOSIS — J9612 Chronic respiratory failure with hypercapnia: Secondary | ICD-10-CM | POA: Diagnosis not present

## 2017-10-14 DIAGNOSIS — Z7901 Long term (current) use of anticoagulants: Secondary | ICD-10-CM | POA: Diagnosis not present

## 2017-10-14 DIAGNOSIS — Z91018 Allergy to other foods: Secondary | ICD-10-CM

## 2017-10-14 DIAGNOSIS — Z6835 Body mass index (BMI) 35.0-35.9, adult: Secondary | ICD-10-CM | POA: Diagnosis not present

## 2017-10-14 DIAGNOSIS — Z91048 Other nonmedicinal substance allergy status: Secondary | ICD-10-CM

## 2017-10-14 DIAGNOSIS — J9383 Other pneumothorax: Secondary | ICD-10-CM | POA: Diagnosis not present

## 2017-10-14 DIAGNOSIS — Z9911 Dependence on respirator [ventilator] status: Secondary | ICD-10-CM

## 2017-10-14 DIAGNOSIS — R0603 Acute respiratory distress: Secondary | ICD-10-CM

## 2017-10-14 DIAGNOSIS — I10 Essential (primary) hypertension: Secondary | ICD-10-CM | POA: Diagnosis present

## 2017-10-14 DIAGNOSIS — J151 Pneumonia due to Pseudomonas: Secondary | ICD-10-CM | POA: Diagnosis not present

## 2017-10-14 DIAGNOSIS — J9611 Chronic respiratory failure with hypoxia: Secondary | ICD-10-CM | POA: Diagnosis not present

## 2017-10-14 DIAGNOSIS — Z931 Gastrostomy status: Secondary | ICD-10-CM | POA: Diagnosis not present

## 2017-10-14 DIAGNOSIS — D649 Anemia, unspecified: Secondary | ICD-10-CM | POA: Diagnosis present

## 2017-10-14 DIAGNOSIS — Z7952 Long term (current) use of systemic steroids: Secondary | ICD-10-CM

## 2017-10-14 DIAGNOSIS — E8801 Alpha-1-antitrypsin deficiency: Secondary | ICD-10-CM | POA: Diagnosis present

## 2017-10-14 DIAGNOSIS — D696 Thrombocytopenia, unspecified: Secondary | ICD-10-CM | POA: Diagnosis not present

## 2017-10-14 DIAGNOSIS — Z7951 Long term (current) use of inhaled steroids: Secondary | ICD-10-CM

## 2017-10-14 DIAGNOSIS — J9690 Respiratory failure, unspecified, unspecified whether with hypoxia or hypercapnia: Secondary | ICD-10-CM

## 2017-10-14 DIAGNOSIS — R7881 Bacteremia: Secondary | ICD-10-CM | POA: Diagnosis present

## 2017-10-14 DIAGNOSIS — J9620 Acute and chronic respiratory failure, unspecified whether with hypoxia or hypercapnia: Secondary | ICD-10-CM | POA: Diagnosis present

## 2017-10-14 DIAGNOSIS — Y95 Nosocomial condition: Secondary | ICD-10-CM | POA: Diagnosis present

## 2017-10-14 DIAGNOSIS — F419 Anxiety disorder, unspecified: Secondary | ICD-10-CM | POA: Diagnosis present

## 2017-10-14 DIAGNOSIS — J9621 Acute and chronic respiratory failure with hypoxia: Secondary | ICD-10-CM

## 2017-10-14 DIAGNOSIS — Z794 Long term (current) use of insulin: Secondary | ICD-10-CM | POA: Diagnosis not present

## 2017-10-14 DIAGNOSIS — J9622 Acute and chronic respiratory failure with hypercapnia: Secondary | ICD-10-CM | POA: Diagnosis present

## 2017-10-14 DIAGNOSIS — Z431 Encounter for attention to gastrostomy: Secondary | ICD-10-CM | POA: Diagnosis not present

## 2017-10-14 DIAGNOSIS — J441 Chronic obstructive pulmonary disease with (acute) exacerbation: Secondary | ICD-10-CM | POA: Diagnosis not present

## 2017-10-14 DIAGNOSIS — J439 Emphysema, unspecified: Secondary | ICD-10-CM | POA: Diagnosis not present

## 2017-10-14 DIAGNOSIS — I9589 Other hypotension: Secondary | ICD-10-CM | POA: Diagnosis present

## 2017-10-14 DIAGNOSIS — E876 Hypokalemia: Secondary | ICD-10-CM | POA: Diagnosis not present

## 2017-10-14 DIAGNOSIS — R197 Diarrhea, unspecified: Secondary | ICD-10-CM | POA: Diagnosis present

## 2017-10-14 DIAGNOSIS — Z87891 Personal history of nicotine dependence: Secondary | ICD-10-CM

## 2017-10-14 DIAGNOSIS — E872 Acidosis: Secondary | ICD-10-CM | POA: Diagnosis not present

## 2017-10-14 DIAGNOSIS — J690 Pneumonitis due to inhalation of food and vomit: Secondary | ICD-10-CM | POA: Diagnosis present

## 2017-10-14 DIAGNOSIS — I2609 Other pulmonary embolism with acute cor pulmonale: Secondary | ICD-10-CM | POA: Diagnosis present

## 2017-10-14 DIAGNOSIS — Z91013 Allergy to seafood: Secondary | ICD-10-CM

## 2017-10-14 DIAGNOSIS — Z888 Allergy status to other drugs, medicaments and biological substances status: Secondary | ICD-10-CM

## 2017-10-14 DIAGNOSIS — I2699 Other pulmonary embolism without acute cor pulmonale: Secondary | ICD-10-CM | POA: Diagnosis not present

## 2017-10-14 DIAGNOSIS — E669 Obesity, unspecified: Secondary | ICD-10-CM | POA: Diagnosis present

## 2017-10-14 DIAGNOSIS — E119 Type 2 diabetes mellitus without complications: Secondary | ICD-10-CM | POA: Diagnosis present

## 2017-10-14 DIAGNOSIS — Z91030 Bee allergy status: Secondary | ICD-10-CM

## 2017-10-14 DIAGNOSIS — J9311 Primary spontaneous pneumothorax: Secondary | ICD-10-CM | POA: Diagnosis not present

## 2017-10-14 DIAGNOSIS — Z79899 Other long term (current) drug therapy: Secondary | ICD-10-CM

## 2017-10-14 DIAGNOSIS — R609 Edema, unspecified: Secondary | ICD-10-CM | POA: Diagnosis not present

## 2017-10-14 DIAGNOSIS — Z930 Tracheostomy status: Secondary | ICD-10-CM | POA: Diagnosis not present

## 2017-10-14 DIAGNOSIS — J156 Pneumonia due to other aerobic Gram-negative bacteria: Secondary | ICD-10-CM | POA: Diagnosis present

## 2017-10-14 LAB — BLOOD CULTURE ID PANEL (REFLEXED)
ACINETOBACTER BAUMANNII: NOT DETECTED
CANDIDA ALBICANS: NOT DETECTED
CANDIDA TROPICALIS: NOT DETECTED
Candida glabrata: NOT DETECTED
Candida krusei: NOT DETECTED
Candida parapsilosis: NOT DETECTED
Carbapenem resistance: NOT DETECTED
ENTEROBACTER CLOACAE COMPLEX: DETECTED — AB
Enterobacteriaceae species: DETECTED — AB
Enterococcus species: NOT DETECTED
Escherichia coli: NOT DETECTED
HAEMOPHILUS INFLUENZAE: NOT DETECTED
Klebsiella oxytoca: NOT DETECTED
Klebsiella pneumoniae: NOT DETECTED
Listeria monocytogenes: NOT DETECTED
Neisseria meningitidis: NOT DETECTED
PROTEUS SPECIES: NOT DETECTED
Pseudomonas aeruginosa: NOT DETECTED
SERRATIA MARCESCENS: NOT DETECTED
STAPHYLOCOCCUS SPECIES: NOT DETECTED
STREPTOCOCCUS SPECIES: NOT DETECTED
Staphylococcus aureus (BCID): NOT DETECTED
Streptococcus agalactiae: NOT DETECTED
Streptococcus pneumoniae: NOT DETECTED
Streptococcus pyogenes: NOT DETECTED

## 2017-10-14 LAB — CBC WITH DIFFERENTIAL/PLATELET
BASOS PCT: 1 %
Basophils Absolute: 0.3 10*3/uL — ABNORMAL HIGH (ref 0.0–0.1)
EOS PCT: 5 %
Eosinophils Absolute: 1.7 10*3/uL — ABNORMAL HIGH (ref 0.0–0.7)
HCT: 40.4 % (ref 36.0–46.0)
HEMOGLOBIN: 11.2 g/dL — AB (ref 12.0–15.0)
LYMPHS ABS: 1 10*3/uL (ref 0.7–4.0)
Lymphocytes Relative: 3 %
MCH: 24.5 pg — ABNORMAL LOW (ref 26.0–34.0)
MCHC: 27.7 g/dL — AB (ref 30.0–36.0)
MCV: 88.4 fL (ref 78.0–100.0)
MONO ABS: 2.4 10*3/uL — AB (ref 0.1–1.0)
MONOS PCT: 7 %
NEUTROS ABS: 28.9 10*3/uL — AB (ref 1.7–7.7)
Neutrophils Relative %: 84 %
Platelets: 311 10*3/uL (ref 150–400)
RBC: 4.57 MIL/uL (ref 3.87–5.11)
RDW: 17.1 % — AB (ref 11.5–15.5)
WBC: 34.3 10*3/uL — ABNORMAL HIGH (ref 4.0–10.5)

## 2017-10-14 LAB — COMPREHENSIVE METABOLIC PANEL
ALBUMIN: 3.9 g/dL (ref 3.5–5.0)
ALT: 47 U/L (ref 14–54)
AST: 40 U/L (ref 15–41)
Alkaline Phosphatase: 83 U/L (ref 38–126)
Anion gap: 10 (ref 5–15)
BUN: 19 mg/dL (ref 6–20)
CHLORIDE: 97 mmol/L — AB (ref 101–111)
CO2: 32 mmol/L (ref 22–32)
CREATININE: 0.72 mg/dL (ref 0.44–1.00)
Calcium: 9.5 mg/dL (ref 8.9–10.3)
GFR calc Af Amer: >60 mL/min (ref >60)
GFR calc non Af Amer: >60 mL/min (ref >60)
GLUCOSE: 191 mg/dL — AB (ref 65–99)
POTASSIUM: 4.2 mmol/L (ref 3.5–5.1)
SODIUM: 139 mmol/L (ref 135–145)
Total Bilirubin: 1 mg/dL (ref 0.3–1.2)
Total Protein: 7.2 g/dL (ref 6.5–8.1)

## 2017-10-14 LAB — URINALYSIS, ROUTINE W REFLEX MICROSCOPIC
Bilirubin Urine: NEGATIVE
Glucose, UA: NEGATIVE mg/dL
Hgb urine dipstick: NEGATIVE
KETONES UR: NEGATIVE mg/dL
Nitrite: POSITIVE — AB
Protein, ur: 30 mg/dL — AB
Specific Gravity, Urine: 1.034 — ABNORMAL HIGH (ref 1.005–1.030)
pH: 7 (ref 5.0–8.0)

## 2017-10-14 LAB — I-STAT ARTERIAL BLOOD GAS, ED
Acid-Base Excess: 9 mmol/L — ABNORMAL HIGH (ref 0.0–2.0)
BICARBONATE: 32.3 mmol/L — AB (ref 20.0–28.0)
O2 Saturation: 100 %
PCO2 ART: 37.9 mmHg (ref 32.0–48.0)
PO2 ART: 193 mmHg — AB (ref 83.0–108.0)
Patient temperature: 98.3
TCO2: 33 mmol/L — ABNORMAL HIGH (ref 22–32)
pH, Arterial: 7.538 — ABNORMAL HIGH (ref 7.350–7.450)

## 2017-10-14 LAB — GLUCOSE, CAPILLARY
GLUCOSE-CAPILLARY: 160 mg/dL — AB (ref 65–99)
Glucose-Capillary: 185 mg/dL — ABNORMAL HIGH (ref 65–99)

## 2017-10-14 LAB — PROCALCITONIN: Procalcitonin: 3.82 ng/mL

## 2017-10-14 LAB — HEPARIN LEVEL (UNFRACTIONATED): Heparin Unfractionated: 0.26 IU/mL — ABNORMAL LOW (ref 0.30–0.70)

## 2017-10-14 LAB — MRSA PCR SCREENING: MRSA by PCR: NEGATIVE

## 2017-10-14 LAB — I-STAT CG4 LACTIC ACID, ED: LACTIC ACID, VENOUS: 0.65 mmol/L (ref 0.5–1.9)

## 2017-10-14 MED ORDER — CHLORHEXIDINE GLUCONATE 0.12% ORAL RINSE (MEDLINE KIT)
15.0000 mL | Freq: Two times a day (BID) | OROMUCOSAL | Status: DC
  Administered 2017-10-14 – 2017-10-19 (×9): 15 mL via OROMUCOSAL

## 2017-10-14 MED ORDER — ALBUTEROL (5 MG/ML) CONTINUOUS INHALATION SOLN
10.0000 mg/h | INHALATION_SOLUTION | RESPIRATORY_TRACT | Status: DC
  Administered 2017-10-14: 10 mg/h via RESPIRATORY_TRACT
  Filled 2017-10-14: qty 20

## 2017-10-14 MED ORDER — CLONAZEPAM 0.5 MG PO TABS
1.0000 mg | ORAL_TABLET | Freq: Three times a day (TID) | ORAL | Status: DC
Start: 2017-10-15 — End: 2017-10-20
  Administered 2017-10-15 – 2017-10-20 (×17): 1 mg
  Filled 2017-10-14 (×17): qty 2

## 2017-10-14 MED ORDER — SODIUM CHLORIDE 0.9 % IV SOLN
250.0000 mL | INTRAVENOUS | Status: DC | PRN
  Administered 2017-10-15: 10 mL via INTRAVENOUS

## 2017-10-14 MED ORDER — LEVALBUTEROL HCL 0.63 MG/3ML IN NEBU
0.6300 mg | INHALATION_SOLUTION | RESPIRATORY_TRACT | Status: DC | PRN
  Administered 2017-10-15 – 2017-10-20 (×20): 0.63 mg via RESPIRATORY_TRACT
  Filled 2017-10-14 (×20): qty 3

## 2017-10-14 MED ORDER — INSULIN ASPART 100 UNIT/ML ~~LOC~~ SOLN
0.0000 [IU] | SUBCUTANEOUS | Status: DC
  Administered 2017-10-14 (×2): 3 [IU] via SUBCUTANEOUS
  Administered 2017-10-15: 2 [IU] via SUBCUTANEOUS
  Administered 2017-10-15 – 2017-10-16 (×3): 3 [IU] via SUBCUTANEOUS
  Administered 2017-10-16 – 2017-10-17 (×2): 2 [IU] via SUBCUTANEOUS
  Administered 2017-10-17 (×3): 3 [IU] via SUBCUTANEOUS
  Administered 2017-10-17 – 2017-10-18 (×5): 2 [IU] via SUBCUTANEOUS
  Administered 2017-10-18 – 2017-10-19 (×2): 3 [IU] via SUBCUTANEOUS
  Administered 2017-10-19 – 2017-10-20 (×4): 2 [IU] via SUBCUTANEOUS

## 2017-10-14 MED ORDER — SODIUM CHLORIDE 0.9 % IV SOLN
2.0000 g | Freq: Two times a day (BID) | INTRAVENOUS | Status: DC
Start: 2017-10-14 — End: 2017-10-17
  Administered 2017-10-15 – 2017-10-16 (×5): 2 g via INTRAVENOUS
  Filled 2017-10-14 (×6): qty 2

## 2017-10-14 MED ORDER — PROMETHAZINE HCL 25 MG/ML IJ SOLN
12.5000 mg | Freq: Four times a day (QID) | INTRAMUSCULAR | Status: DC | PRN
Start: 2017-10-14 — End: 2017-10-20
  Administered 2017-10-14: 12.5 mg via INTRAVENOUS
  Filled 2017-10-14: qty 1

## 2017-10-14 MED ORDER — QUETIAPINE FUMARATE 100 MG PO TABS
100.0000 mg | ORAL_TABLET | Freq: Two times a day (BID) | ORAL | Status: DC
  Administered 2017-10-15 – 2017-10-20 (×11): 100 mg
  Filled 2017-10-14 (×11): qty 1

## 2017-10-14 MED ORDER — METHYLPREDNISOLONE SODIUM SUCC 40 MG IJ SOLR
40.0000 mg | Freq: Two times a day (BID) | INTRAMUSCULAR | Status: DC
  Administered 2017-10-14 – 2017-10-18 (×8): 40 mg via INTRAVENOUS
  Filled 2017-10-14 (×9): qty 1

## 2017-10-14 MED ORDER — PAROXETINE HCL 30 MG PO TABS
30.0000 mg | ORAL_TABLET | Freq: Every day | ORAL | Status: DC
  Administered 2017-10-15 – 2017-10-20 (×6): 30 mg
  Filled 2017-10-14 (×6): qty 1

## 2017-10-14 MED ORDER — HEPARIN BOLUS VIA INFUSION
4000.0000 [IU] | Freq: Once | INTRAVENOUS | Status: AC
  Administered 2017-10-14: 4000 [IU] via INTRAVENOUS
  Filled 2017-10-14: qty 4000

## 2017-10-14 MED ORDER — SODIUM CHLORIDE 0.9 % IV SOLN
0.1000 mg/kg/h | INTRAVENOUS | Status: DC
  Filled 2017-10-14: qty 2

## 2017-10-14 MED ORDER — LORAZEPAM 2 MG/ML IJ SOLN
1.0000 mg | INTRAMUSCULAR | Status: DC | PRN
  Administered 2017-10-14 – 2017-10-17 (×3): 1 mg via INTRAVENOUS
  Filled 2017-10-14 (×3): qty 1

## 2017-10-14 MED ORDER — BUDESONIDE 0.5 MG/2ML IN SUSP
0.5000 mg | Freq: Two times a day (BID) | RESPIRATORY_TRACT | Status: DC
Start: 2017-10-14 — End: 2017-10-20
  Administered 2017-10-14 – 2017-10-20 (×12): 0.5 mg via RESPIRATORY_TRACT
  Filled 2017-10-14 (×13): qty 2

## 2017-10-14 MED ORDER — SODIUM CHLORIDE 0.9 % IV SOLN
3.0000 g | Freq: Four times a day (QID) | INTRAVENOUS | Status: DC
  Administered 2017-10-14 (×2): 3 g via INTRAVENOUS
  Filled 2017-10-14 (×5): qty 3

## 2017-10-14 MED ORDER — ORAL CARE MOUTH RINSE
15.0000 mL | OROMUCOSAL | Status: DC
  Administered 2017-10-15 – 2017-10-20 (×45): 15 mL via OROMUCOSAL

## 2017-10-14 MED ORDER — IOPAMIDOL (ISOVUE-370) INJECTION 76%
INTRAVENOUS | Status: AC
  Filled 2017-10-14: qty 100

## 2017-10-14 MED ORDER — HEPARIN BOLUS VIA INFUSION
1100.0000 [IU] | Freq: Once | INTRAVENOUS | Status: AC
  Administered 2017-10-14: 1100 [IU] via INTRAVENOUS
  Filled 2017-10-14: qty 1100

## 2017-10-14 MED ORDER — ALPHA1-PROTEINASE INHIBITOR 1000 MG/20ML IV SOLN: 60.0000 mg/kg | INTRAVENOUS | Status: DC

## 2017-10-14 MED ORDER — IOPAMIDOL (ISOVUE-300) INJECTION 61%
INTRAVENOUS | Status: AC
Start: 2017-10-14 — End: 2017-10-15
  Filled 2017-10-14: qty 50

## 2017-10-14 MED ORDER — MIDODRINE HCL 5 MG PO TABS
5.0000 mg | ORAL_TABLET | Freq: Three times a day (TID) | ORAL | Status: DC
  Administered 2017-10-14 – 2017-10-20 (×18): 5 mg
  Filled 2017-10-14 (×19): qty 1

## 2017-10-14 MED ORDER — FREE WATER
200.0000 mL | Status: DC
  Administered 2017-10-14 – 2017-10-20 (×36): 200 mL

## 2017-10-14 MED ORDER — SODIUM CHLORIDE 0.9 % IV SOLN
INTRAVENOUS | Status: DC
  Administered 2017-10-14 – 2017-10-20 (×7): via INTRAVENOUS

## 2017-10-14 MED ORDER — IOPAMIDOL (ISOVUE-370) INJECTION 76%
100.0000 mL | Freq: Once | INTRAVENOUS | Status: AC | PRN
  Administered 2017-10-14: 100 mL via INTRAVENOUS

## 2017-10-14 MED ORDER — FAMOTIDINE IN NACL 20-0.9 MG/50ML-% IV SOLN
20.0000 mg | Freq: Two times a day (BID) | INTRAVENOUS | Status: DC
  Administered 2017-10-14 – 2017-10-16 (×5): 20 mg via INTRAVENOUS
  Filled 2017-10-14 (×6): qty 50

## 2017-10-14 MED ORDER — HEPARIN (PORCINE) IN NACL 100-0.45 UNIT/ML-% IJ SOLN
1500.0000 [IU]/h | INTRAMUSCULAR | Status: AC
  Administered 2017-10-14: 1200 [IU]/h via INTRAVENOUS
  Administered 2017-10-15: 1350 [IU]/h via INTRAVENOUS
  Administered 2017-10-16 – 2017-10-18 (×4): 1500 [IU]/h via INTRAVENOUS
  Filled 2017-10-14 (×6): qty 250

## 2017-10-14 MED ORDER — ROPINIROLE HCL 0.5 MG PO TABS
0.5000 mg | ORAL_TABLET | Freq: Every day | ORAL | Status: DC
  Administered 2017-10-15 – 2017-10-19 (×5): 0.5 mg
  Filled 2017-10-14 (×5): qty 1

## 2017-10-14 NOTE — Progress Notes (Signed)
Patient transported to CT and back to trauma A. 

## 2017-10-14 NOTE — H&P (Addendum)
PULMONARY / CRITICAL CARE MEDICINE   Name: Danielle Stevens MRN: [id number] DOB: [birth year]    ADMISSION DATE:  10/14/2017 CONSULTATION DATE:  10/14/17  REFERRING MD:  [facility]  CHIEF COMPLAINT:  Respiratory Distress  HISTORY OF PRESENT ILLNESS:  Pt is encephelopathic; therefore, this HPI is obtained from chart review. Danielle Stevens is a 47 y.o. 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 She had recent admission to [facility] 08/22/17 through 09/25/17 for acute on chronic hypercapnic respiratory failure.  She was treated with BDs, steroids, continued on Prolastin weekly and had vent settings adjusted.  She was then [facility] on 5/30.  On 6/18, she had respiratory distress with RR in 40s and HR in 140s.  ABG at [facility] demonstrated respiratory acidosis / hypercapnia (7.11 / 163 / 80).  She was subsequently brought to [facility] for further evaluation and management.  She was taken for CTA and while there, had vomiting episode of 500-700cc, appeared like clear mucus.  Question whether she had aspiration while at [facility]. After vomiting episode, her RR improved to low 20s and HR down to 110-120.  Per RN and RT staff, pt also appears much better and pt nodding her head yes when asked if she feels better.   PAST MEDICAL HISTORY :  She  has a past medical history of Alpha-1-antitrypsin deficiency (HCC), Anemia, Anxiety, Asthma, COPD (chronic obstructive pulmonary disease) (HCC), Diabetes mellitus without complication (HCC), Emphysema lung (HCC), and Hypertension.  PAST SURGICAL HISTORY: She  has a past surgical history that includes Tracheostomy; PEG tube placement; Portacath placement (Right); and Port-a-cath removal (Left, 08/26/2017).  Allergies  Allergen Reactions  . Wasp Venom Protein Shortness Of Breath  . Adhesive [Tape] Other (See  Comments)    Bruises   . Coconut Oil Hives  . Latex Other (See Comments)    Bruises   . Robitussin Severe Multi-Symp [Phenylephrine-Dm-Gg-Apap] Diarrhea and Other (See Comments)    Allergic, per verbal MAR  . Shellfish-Derived Products Hives and Other (See Comments)    Allergic, per verbal MAR    No current facility-administered medications on file prior to encounter.    Current Outpatient Medications on File Prior to Encounter  Medication Sig  . alpha-1-proteinase inhibitor, human, (PROLASTIN-C) 1000 MG/20ML SOLN Inject 60 mg/kg into the vein once a week.  . chlorhexidine (PERIDEX) 0.12 % solution Use as directed 5 mLs in the mouth or throat 2 (two) times daily.  . clonazePAM (KLONOPIN) 1 MG tablet Place 1 mg into feeding tube every 8 (eight) hours.   . enoxaparin (LOVENOX) 40 MG/0.4ML injection Inject 40 mg into the skin every 12 (twelve) hours.   . Famotidine 20 MG/2ML SOLN Inject 40 mg into the vein every 12 (twelve) hours.  . insulin aspart (NOVOLOG) 100 UNIT/ML injection Inject 0-20 Units into the skin every 4 (four) hours. (Patient taking differently: Inject 0-20 Units into the skin every 6 (six) hours. Sliding Scale < 70 or >400 Notify MD Blood Sugar is 70-120 = 0 Units 121-150 =3 units 151-200 = 4 units 201-250 =7 units 251-300 = 11 units 301-350= 15 units 351-400 20 units)  . insulin glargine (LANTUS) 100 UNIT/ML injection Inject 0.14 mLs (14 Units total) into the skin at bedtime.  . midodrine (PROAMATINE) 5 MG tablet Place 1 tablet (5 mg total) into feeding tube 3 (three) times daily with meals.  . Multiple Vitamins-Minerals (  MULTIVITAMIN WITH MINERALS) tablet Place 1 tablet into feeding tube daily.   . Nutritional Supplements (FEEDING SUPPLEMENT, VITAL AF 1.2 CAL,) LIQD Place 1,000 mLs into feeding tube continuous. (Patient taking differently: Place 1,000 mLs into feeding tube See admin instructions. 60 ml/hr Day and Night)  . PARoxetine (PAXIL) 30 MG tablet Give 30 mg  by tube daily.  . polyethylene glycol (MIRALAX / GLYCOLAX) packet Place 17 g into feeding tube daily.   . predniSONE (DELTASONE) 20 MG tablet Place 2 tablets (40 mg total) into feeding tube daily with breakfast.  . Probiotic Product (ACIDOPHILUS/BIFIDUS PO) Place 1 Can into feeding tube daily.  Marland Kitchen rOPINIRole (REQUIP) 0.5 MG tablet Give 0.5 mg by tube daily.  . Sodium Chloride Flush (NORMAL SALINE FLUSH IV) Inject 10 mLs into the vein See admin instructions. Shifts days and nights  . acetaminophen (TYLENOL) 160 MG/5ML solution Place 15.6 mLs (500 mg total) into feeding tube every 6 (six) hours as needed for mild pain, headache or fever.  Marland Kitchen albuterol (PROVENTIL) (2.5 MG/3ML) 0.083% nebulizer solution Take 3 mLs (2.5 mg total) by nebulization every 2 (two) hours as needed for wheezing or shortness of breath.  . alpha-1-proteinase inhibitor (human) 1000 MG SOLR 4,964 mg, Empty Containers Flexible MISC 1 each Inject 4,964 mg into the vein once a week.  . budesonide (PULMICORT) 0.5 MG/2ML nebulizer solution Take 2 mLs (0.5 mg total) by nebulization 2 (two) times daily.  . busPIRone (BUSPAR) 15 MG tablet Take 15 mg by mouth 2 (two) times daily.  . calcium carbonate (TUMS - DOSED IN MG ELEMENTAL CALCIUM) 500 MG chewable tablet Place 1 tablet into feeding tube every 8 (eight) hours as needed for indigestion or heartburn.  . cetirizine (ZYRTEC) 10 MG tablet Place 10 mg into feeding tube at bedtime.  Marland Kitchen doxepin (SINEQUAN) 10 MG capsule Place 10 mg into feeding tube at bedtime as needed (for insomnia).  Marland Kitchen ipratropium-albuterol (DUONEB) 0.5-2.5 (3) MG/3ML SOLN Take 3 mLs by nebulization every 4 (four) hours.  Marland Kitchen LORazepam (ATIVAN) 2 MG/ML concentrated solution Place 0.5 mg into feeding tube every 12 (twelve) hours as needed for anxiety.  Marland Kitchen omeprazole (PRILOSEC) 40 MG capsule 40 mg per tube every morning  . ondansetron (ZOFRAN) 4 MG tablet Place 4 mg into feeding tube every 4 (four) hours as needed for nausea or  vomiting.  Marland Kitchen oxyCODONE (ROXICODONE) 5 MG/5ML solution Place 5 mLs (5 mg total) into feeding tube every 3 (three) hours as needed for moderate pain.  Marland Kitchen QUEtiapine (SEROQUEL) 100 MG tablet Place 1 tablet (100 mg total) into feeding tube 2 (two) times daily. (Patient taking differently: Place 100 mg into feeding tube every 12 (twelve) hours. )  . Water For Irrigation, Sterile (FREE WATER) SOLN Place 300 mLs into feeding tube every 4 (four) hours.    FAMILY HISTORY:  Her has no family status information on file.    SOCIAL HISTORY: She  reports that she has quit smoking. Her smoking use included cigarettes. She has never used smokeless tobacco. She reports that she drank alcohol. She reports that she does not use drugs.  REVIEW OF SYSTEMS:   Unable to obtain as pt is on vent via trach.  SUBJECTIVE:  Nodding her head that she feels much better.    VITAL SIGNS: BP (!) 148/96   Pulse (!) 135   Temp 97.8 F (36.6 C) (Oral)   Resp (!) 24   Ht 5\' 4"  (1.626 m)   Wt 83.9 kg (185 lb)  LMP  (LMP Unknown)   SpO2 97%   BMI 31.76 kg/m   HEMODYNAMICS:    VENTILATOR SETTINGS: Vent Mode: PCV FiO2 (%):  [100 %] 100 % Set Rate:  [28 bmp] 28 bmp PEEP:  [8 cmH20] 8 cmH20 Plateau Pressure:  [27 cmH20] 27 cmH20  INTAKE / OUTPUT: No intake/output data recorded.   PHYSICAL EXAMINATION: General: Adult female, chronically ill appearing, resting in bed, in NAD. Neuro: Alert, nods head appropriately and mouths words, no focal deficits. HEENT: [place]/AT. Sclerae anicteric, EOMI. Cardiovascular: Tachy, regular, no M/R/G.  Lungs: Respirations even and unlabored.  Faint crackles in bases. Abdomen: PEG C/D/I, Obese, BS x 4, soft, NT/ND.  Musculoskeletal: No gross deformities, no edema.  Skin: Intact, warm, no rashes.   LABS:  BMET Recent Labs  Lab 10/14/17 1040  NA 139  K 4.2  CL 97*  CO2 32  BUN 19  CREATININE 0.72  GLUCOSE 191*    Electrolytes Recent Labs  Lab 10/14/17 1040   CALCIUM 9.5    CBC Recent Labs  Lab 10/14/17 1040  WBC 34.3*  HGB 11.2*  HCT 40.4  PLT 311    Coag's No results for input(s): APTT, INR in the last 168 hours.  Sepsis Markers Recent Labs  Lab 10/14/17 1050  LATICACIDVEN 0.65    ABG No results for input(s): PHART, PCO2ART, PO2ART in the last 168 hours.  Liver Enzymes Recent Labs  Lab 10/14/17 1040  AST 40  ALT 47  ALKPHOS 83  BILITOT 1.0  ALBUMIN 3.9    Cardiac Enzymes No results for input(s): TROPONINI, PROBNP in the last 168 hours.  Glucose No results for input(s): GLUCAP in the last 168 hours.  Imaging Dg Chest Portable 1 View  Result Date: 10/14/2017 CLINICAL DATA:  Shortness of breath, ventilator dependent with tracheostomy, hypertension, diabetes mellitus EXAM: PORTABLE CHEST 1 VIEW COMPARISON:  Portable exam 1038 hours compared to 09/25/2017 FINDINGS: Tracheostomy tube projects over tracheal air column. LEFT arm PICC line tip projects over SVC. Normal heart size, mediastinal contours, and pulmonary vascularity. Emphysematous and bronchitic changes consistent with COPD. Persistent interstitial prominence in the mid to lower RIGHT lung concerning for persistent infiltrate. Chronic blunting of the LEFT lateral costophrenic angle. No definite pleural effusion or pneumothorax. Bones demineralized. Atherosclerotic calcification noted at aortic arch. IMPRESSION: COPD changes with chronic blunting of LEFT costophrenic angle and suspected persistent mild interstitial infiltrate in the mid to lower RIGHT lung. When compared to the previous exam no significant change. Electronically Signed   By: Ulyses Southward M.D.   On: 10/14/2017 10:57     STUDIES:  CXR 6/18 > chronic emphysematous changes. CTA chest 6/18 > small LL PE.  Question small right PE as well (though not mentioned on report).  No heart strain.  Extensive underlying emphysematous changes. LE duplex 6/18 >   CULTURES: Blood 6/18 >  Sputum 6/18 >    ANTIBIOTICS: Unasyn 6/18 >   SIGNIFICANT EVENTS: 6/18 > admit.  LINES/TUBES: Trach > L PICC 5/6 >   DISCUSSION: 47 y.o. female with hx A1AT deficiency, COPD, chronic hypoxic / hypercapnic respiratory failure s/p trach and chronically vent dependent.  Recent admit 4/26 through 5/30 then discharged to [facility]. Re-admitted 6/18 with respiratory distress and AoC hypercapnic respiratory failure.  ASSESSMENT / PLAN:  PULMONARY A: Acute on chronic hypoxic and hypercapnic respiratory failure - s/p trach and chronically vent dependent. Small left sided PE - question small right as well though not noted on radiology report. Probable  aspiration - hx of vomiting. COPD with emphysema and hx A1AT deficiency - not a transplant candidate per [facility] and [facility] after inquiry on last admit. P:   Continue full vent support (PC 30, PEEP 8, RR 28, FiO2 100%). Repeat ABG in 1 hour and adjust vent accordingly. Wean as able. VAP prevention measures. Heparin per pharmacy. Empiric unasyn for now. Continue BD's, steroids. Continue weekly Prolastin (on Fridays). CXR intermittently.  CARDIOVASCULAR A:  Tachycardia - improving. Hx chronic hypotension (on midodrine). P:  Monitor clinically. Continue preadmission midodrine.  RENAL A: No acute issues. P: NS @ 75. Follow BMP.  GASTROINTESTINAL A:   Nausea with vomiting. Nutrition. P:   Phenergan PRN. NPO. Hold TF's for now given nausea and vomiting.  HEMATOLOGIC A:   VTE Prophylaxis. P:  Heparin. No SCD's until LE duplex completed. Follow CBC.  INFECTIOUS A:   Probable aspiration - hx of vomiting. P:   Abx as above (unasyn).  Follow cultures as above. PCT algorithm to limit abx exposure.  ENDOCRINE A: Hx DM. P: SSI.  NEUROLOGIC A:   Hx Anxiety. P:   Continue preadmission Klonopin, Lorazepam, Paroxetine, Quetiapine, Ropinirole.  Family updated: None available.  Interdisciplinary Family Meeting v Palliative Care  Meeting:  Due by: 10/21/17.  CC time: 40 min.  Admit to ICU today under PCCM service given concern for recurrent distress and need to optimize vent settings.  If remains stable overnight then likely can transfer to SDU status in AM 6/19 and TRH service in AM 6/19.   Rutherford Guys, [place] [facility] Pager: [phone]  or [phone] 10/14/2017, 11:49 AM

## 2017-10-14 NOTE — Progress Notes (Signed)
ANTICOAGULATION CONSULT NOTE  Pharmacy Consult for heparin Indication: pulmonary embolus  Allergies  Allergen Reactions  . Wasp Venom Protein Shortness Of Breath  . Adhesive [Tape] Other (See Comments)    Bruises   . Coconut Oil Hives  . Latex Other (See Comments)    Bruises   . Robitussin Severe Multi-Symp [Phenylephrine-Dm-Gg-Apap] Diarrhea and Other (See Comments)    Allergic, per verbal MAR  . Shellfish-Derived Products Hives and Other (See Comments)    Allergic, per verbal MAR    Patient Measurements: Height: 5\' 4"  (162.6 cm) Weight: 185 lb (83.9 kg) IBW/kg (Calculated) : 54.7 Heparin Dosing Weight: 73kg  Vital Signs: Temp: 99.7 F (37.6 C) (06/18 2033) Temp Source: Oral (06/18 2033) BP: 107/76 (06/18 2100) Pulse Rate: 97 (06/18 2100)  Labs: Recent Labs    10/14/17 1040 10/14/17 2135  HGB 11.2*  --   HCT 40.4  --   PLT 311  --   HEPARINUNFRC  --  0.26*  CREATININE 0.72  --     Estimated Creatinine Clearance: 90.1 mL/min (by C-G formula based on SCr of 0.72 mg/dL).  Medications: . sodium chloride 75 mL/hr at 10/14/17 1318  . sodium chloride    . albuterol 10 mg/hr (10/14/17 1106)  . ampicillin-sulbactam (UNASYN) IV 3 g (10/14/17 2126)  . famotidine (PEPCID) IV    . heparin 1,200 Units/hr (10/14/17 1317)    Assessment: 46 yoF admitted with respiratory distress found to have acute LLL PE without R heart strain. Per nursing home [facility] pt received LMWH yesterday for unclear reasons, no other anticoagulants noted.  Heparin level is subtherapeutic at 0.26. No bleeding noted.  Goal of Therapy:  Heparin level 0.3-0.7 units/ml Monitor platelets by anticoagulation protocol: Yes   Plan:  - Heparin 1100 units IV bolus then increase to 1350 units/hr - 6 hr heparin level - Daily heparin level and CBC - Monitor for s/sx of bleeding   Loura Back, PharmD, BCPS Clinical Pharmacist Clinical phone for 10/14/2017 until 10p is [phone] Please check AMION for  all [facility] Pharm[facility] numbers 10/14/2017 10:03 PM

## 2017-10-14 NOTE — Progress Notes (Signed)
PHARMACY - PHYSICIAN COMMUNICATION CRITICAL VALUE ALERT - BLOOD CULTURE IDENTIFICATION (BCID)  Danielle Stevens is an 47 y.o. female with alpha 1 antitrypsin deficiency who presented to [facility] on 10/14/2017 with a chief complaint of respiratory distress. She vomited in CT today and was started on Unasyn for possible aspiration.  Assessment:  1/2 BCx now showing GNR with BCID below.  Name of physician (or Provider) ContactedNicholos Johns  Current antibiotics: Unasyn 3 g IV q6h   Changes to prescribed antibiotics recommended:  Recommendations accepted by provider - change to cefepime  Results for orders placed or performed during the hospital encounter of 10/14/17  Blood Culture ID Panel (Reflexed) (Collected: 10/14/2017 11:00 AM)  Result Value Ref Range   Enterococcus species NOT DETECTED NOT DETECTED   Listeria monocytogenes NOT DETECTED NOT DETECTED   Staphylococcus species NOT DETECTED NOT DETECTED   Staphylococcus aureus NOT DETECTED NOT DETECTED   Streptococcus species NOT DETECTED NOT DETECTED   Streptococcus agalactiae NOT DETECTED NOT DETECTED   Streptococcus pneumoniae NOT DETECTED NOT DETECTED   Streptococcus pyogenes NOT DETECTED NOT DETECTED   Acinetobacter baumannii NOT DETECTED NOT DETECTED   Enterobacteriaceae species DETECTED (A) NOT DETECTED   Enterobacter cloacae complex DETECTED (A) NOT DETECTED   Escherichia coli NOT DETECTED NOT DETECTED   Klebsiella oxytoca NOT DETECTED NOT DETECTED   Klebsiella pneumoniae NOT DETECTED NOT DETECTED   Proteus species NOT DETECTED NOT DETECTED   Serratia marcescens NOT DETECTED NOT DETECTED   Carbapenem resistance NOT DETECTED NOT DETECTED   Haemophilus influenzae NOT DETECTED NOT DETECTED   Neisseria meningitidis NOT DETECTED NOT DETECTED   Pseudomonas aeruginosa NOT DETECTED NOT DETECTED   Candida albicans NOT DETECTED NOT DETECTED   Candida glabrata NOT DETECTED NOT DETECTED   Candida krusei NOT DETECTED NOT DETECTED   Candida parapsilosis NOT DETECTED NOT DETECTED   Candida tropicalis NOT DETECTED NOT DETECTED    Loura Back, PharmD, BCPS Clinical Pharmacist Clinical phone for 10/14/2017 until 10p is [phone] Please check AMION for all [facility] Pharmacy numbers 10/14/2017 10:02 PM

## 2017-10-14 NOTE — Progress Notes (Signed)
Pharmacy Antibiotic Note  Danielle Stevens is a 47 y.o. female admitted on 10/14/2017 with respiratory distress.  Pharmacy has been consulted for Unasyn dosing with concern for aspiration.  Plan: Unasyn 3g IV q6h Monitor cultures, renal funx, LOT  Height: 5\' 4"  (162.6 cm) Weight: 185 lb (83.9 kg) IBW/kg (Calculated) : 54.7  Temp (24hrs), Avg:97.8 F (36.6 C), Min:97.8 F (36.6 C), Max:97.8 F (36.6 C)  Recent Labs  Lab 10/14/17 1040 10/14/17 1050  WBC 34.3*  --   CREATININE 0.72  --   LATICACIDVEN  --  0.65    Estimated Creatinine Clearance: 90.1 mL/min (by C-G formula based on SCr of 0.72 mg/dL).    Allergies  Allergen Reactions  . Wasp Venom Protein Shortness Of Breath  . Adhesive [Tape] Other (See Comments)    Bruises   . Coconut Oil Hives  . Latex Other (See Comments)    Bruises   . Robitussin Severe Multi-Symp [Phenylephrine-Dm-Gg-Apap] Diarrhea and Other (See Comments)    Allergic, per verbal MAR  . Shellfish-Derived Products Hives and Other (See Comments)    Allergic, per verbal MAR    Antimicrobials this admission: Unasyn 6/18 >>   Dose adjustments this admission: none  Microbiology results: sent  Thank you for allowing pharmacy to be a part of this patient's care.  Fredonia Highland, PharmD, BCPS PGY-2 Cardiology Pharmacy Resident Clinical Phone: [phone] 10/14/2017

## 2017-10-14 NOTE — ED Notes (Signed)
Pt's daughter, Joice Lofts, called the ER and stated she was not informed by [facility] prior to pt coming to ER, and had found out approx 10 mins ago that the pt was here. She also stated she isn't sure why the pt was brought here. Said she seen her mom at [facility] on Sunday and knew she wasn't doing well - with her breathing. Pt's daughter was made aware the pt was in radiology with RN band RN was unavailable at this time, by this NS. Pt's daughter is requesting an update. Her cell phone number is [phone]. Her work phone number if unable to reach on cell is [phone].

## 2017-10-14 NOTE — ED Notes (Signed)
Attempted report 

## 2017-10-14 NOTE — Progress Notes (Signed)
Wasted 3 mL (300 mg) of Ketamine IV in main pharmacy sharps container in preparing her IV.

## 2017-10-14 NOTE — ED Notes (Signed)
Nurse spoke with daughter about pt being admitted to hospital and why pt was at the hospital. Daughter was updated and contact info in chart.

## 2017-10-14 NOTE — ED Notes (Signed)
Pt vomited clear mucous-like emesis in CT. Langston Masker, PA notifed.

## 2017-10-14 NOTE — Progress Notes (Addendum)
ANTICOAGULATION CONSULT NOTE - Initial Consult  Pharmacy Consult for heparin Indication: pulmonary embolus  Allergies  Allergen Reactions  . Wasp Venom Protein Shortness Of Breath  . Adhesive [Tape] Other (See Comments)    Bruises   . Coconut Oil Hives  . Latex Other (See Comments)    Bruises   . Robitussin Severe Multi-Symp [Phenylephrine-Dm-Gg-Apap] Diarrhea and Other (See Comments)    Allergic, per verbal MAR  . Shellfish-Derived Products Hives and Other (See Comments)    Allergic, per verbal MAR    Patient Measurements: Height: 5\' 4"  (162.6 cm) Weight: 185 lb (83.9 kg) IBW/kg (Calculated) : 54.7 Heparin Dosing Weight: 73kg  Vital Signs: Temp: 97.8 F (36.6 C) (06/18 1038) Temp Source: Oral (06/18 1038) BP: 138/78 (06/18 1235) Pulse Rate: 117 (06/18 1235)  Labs: Recent Labs    10/14/17 1040  HGB 11.2*  HCT 40.4  PLT 311  CREATININE 0.72    Estimated Creatinine Clearance: 90.1 mL/min (by C-G formula based on SCr of 0.72 mg/dL).   Medical History: Past Medical History:  Diagnosis Date  . Alpha-1-antitrypsin deficiency (HCC)   . Anemia   . Anxiety   . Asthma   . COPD (chronic obstructive pulmonary disease) (HCC)   . Diabetes mellitus without complication (HCC)   . Emphysema lung (HCC)   . Hypertension     Assessment: 30 yoF admitted with respiratory distress found to have acute LLL PE without R heart strain. Per nursing home [facility] pt received LMWH yesterday for unclear reasons, no other anticoagulants noted.  Goal of Therapy:  Heparin level 0.3-0.7 units/ml Monitor platelets by anticoagulation protocol: Yes   Plan:  -Heparin 4000 units x1 -Heparin 1200 units/hr -Check 6-hr heparin level -Monitor heparin level, CBC, S/Sx bleeding daily  Fredonia Highland, PharmD, BCPS PGY-2 Cardiology Pharmacy Resident Clinical Phone: [phone] 10/14/2017

## 2017-10-14 NOTE — ED Provider Notes (Signed)
[facility] EMERGENCY DEPARTMENT Provider Note   CSN: [id number] Arrival date & time: 10/14/17  1026     History   Chief Complaint Chief Complaint  Patient presents with  . Respiratory Distress    HPI Connie Lasater is a 47 y.o. female.  The history is provided by the patient. No language interpreter was used.  Shortness of Breath  This is a new problem. The current episode started 6 to 12 hours ago. The problem has been gradually worsening. Associated symptoms include sputum production and wheezing. Pertinent negatives include no fever. She has tried nothing for the symptoms. The treatment provided moderate relief. She has had no prior hospitalizations. She has had no prior ICU admissions. Associated medical issues include chronic lung disease.  Pt resides at [facility] .  [facility] reports pt has had increasing shortness of breath today. Pt sweaty and pale   Past Medical History:  Diagnosis Date  . Alpha-1-antitrypsin deficiency (HCC)   . Anemia   . Anxiety   . Asthma   . COPD (chronic obstructive pulmonary disease) (HCC)   . Diabetes mellitus without complication (HCC)   . Emphysema lung (HCC)   . Hypertension     Patient Active Problem List   Diagnosis Date Noted  . Acute on chronic respiratory failure (HCC) 10/14/2017  . Goals of care, counseling/discussion   . Palliative care by specialist   . Generalized anxiety disorder   . Hypoxemia   . Tracheostomy status (HCC)   . Alpha-1-antitrypsin deficiency (HCC)   . Hypotension 08/29/2017  . Acute on chronic respiratory failure with hypoxia and hypercapnia (HCC)   . COPD exacerbation (HCC)   . VAP (ventilator-associated pneumonia) (HCC)   . Hypercarbia 08/22/2017    Past Surgical History:  Procedure Laterality Date  . PEG TUBE PLACEMENT    . PORT-A-CATH REMOVAL Left 08/26/2017   Procedure: REMOVAL PORT-A-CATH;  Surgeon: Manus Rudd, MD;  Location: [facility] OR;  Service: General;  Laterality:  Left;  . PORTACATH PLACEMENT Right   . TRACHEOSTOMY       OB History   None      Home Medications    Prior to Admission medications   Medication Sig Start Date End Date Taking? Authorizing Provider  acetaminophen (TYLENOL) 325 MG tablet Place 650 mg into feeding tube every 6 (six) hours as needed.    Yes [provider]  alpha-1-proteinase inhibitor (human) 1000 MG SOLR 4,964 mg, Empty Containers Flexible MISC 1 each Inject 4,964 mg into the vein once a week. 09/26/17  Yes Minor, Vilinda Blanks, NP  budesonide (PULMICORT) 0.5 MG/2ML nebulizer solution Take 2 mLs (0.5 mg total) by nebulization 2 (two) times daily. Patient taking differently: Take 0.5 mg by nebulization every 12 (twelve) hours.  09/25/17  Yes Minor, Vilinda Blanks, NP  chlorhexidine (PERIDEX) 0.12 % solution Use as directed 5 mLs in the mouth or throat 2 (two) times daily.   Yes [provider]  clonazePAM (KLONOPIN) 1 MG tablet Place 1 mg into feeding tube every 8 (eight) hours.    Yes [provider]  enoxaparin (LOVENOX) 40 MG/0.4ML injection Inject 40 mg into the skin every 12 (twelve) hours.    Yes [provider]  Famotidine 20 MG/2ML SOLN Inject 40 mg into the vein every 12 (twelve) hours.   Yes [provider]  insulin aspart (NOVOLOG) 100 UNIT/ML injection Inject 0-20 Units into the skin every 4 (four) hours. Patient taking differently: Inject 0-20 Units into the skin  every 6 (six) hours. Sliding Scale < 70 or >400 Notify MD Blood Sugar is 70-120 = 0 Units 121-150 =3 units 151-200 = 4 units 201-250 =7 units 251-300 = 11 units 301-350= 15 units 351-400 20 units 09/25/17  Yes Minor, Vilinda Blanks, NP  insulin glargine (LANTUS) 100 UNIT/ML injection Inject 0.14 mLs (14 Units total) into the skin at bedtime. 09/25/17  Yes Minor, Vilinda Blanks, NP  ipratropium-albuterol (DUONEB) 0.5-2.5 (3) MG/3ML SOLN Take 3 mLs by nebulization every 4 (four) hours. Patient taking differently: Take 3 mLs by  nebulization every 6 (six) hours as needed.  09/25/17  Yes Minor, Vilinda Blanks, NP  levalbuterol (XOPENEX) 1.25 MG/0.5ML nebulizer solution Take 1.25 mg by nebulization every 6 (six) hours as needed for wheezing or shortness of breath.   Yes [provider]  LORazepam (ATIVAN) 1 MG tablet Take 1 mg by mouth every 2 (two) hours as needed for anxiety.   Yes [provider]  midodrine (PROAMATINE) 5 MG tablet Place 1 tablet (5 mg total) into feeding tube 3 (three) times daily with meals. 09/25/17  Yes Minor, Vilinda Blanks, NP  Multiple Vitamins-Minerals (MULTIVITAMIN WITH MINERALS) tablet Place 1 tablet into feeding tube daily.    Yes [provider]  Nutritional Supplements (FEEDING SUPPLEMENT, VITAL AF 1.2 CAL,) LIQD Place 1,000 mLs into feeding tube continuous. Patient taking differently: Place 1,000 mLs into feeding tube See admin instructions. 60 ml/hr Day and Night 09/25/17  Yes Minor, Vilinda Blanks, NP  ondansetron (ZOFRAN) 4 MG/2ML SOLN injection Inject 4 mg into the vein every 6 (six) hours as needed for nausea or vomiting.   Yes [provider]  oxyCODONE (ROXICODONE) 5 MG/5ML solution Place 5 mLs (5 mg total) into feeding tube every 3 (three) hours as needed for moderate pain. 09/25/17  Yes Minor, Vilinda Blanks, NP  PARoxetine (PAXIL) 30 MG tablet Give 30 mg by tube daily. 08/09/17 08/09/18 Yes [provider]  polyethylene glycol (MIRALAX / GLYCOLAX) packet Place 17 g into feeding tube daily.    Yes [provider]  predniSONE (DELTASONE) 20 MG tablet Place 2 tablets (40 mg total) into feeding tube daily with breakfast. 09/26/17  Yes Minor, Vilinda Blanks, NP  Probiotic Product (ACIDOPHILUS/BIFIDUS PO) Place 1 Can into feeding tube daily.   Yes [provider]  promethazine (PHENERGAN) 25 MG/ML injection Inject 12.5 mg into the vein every 6 (six) hours as needed for nausea or vomiting.   Yes [provider]  QUEtiapine (SEROQUEL) 100 MG tablet  Place 1 tablet (100 mg total) into feeding tube 2 (two) times daily. Patient taking differently: Place 100 mg into feeding tube every 12 (twelve) hours.  09/25/17  Yes Minor, Vilinda Blanks, NP  rOPINIRole (REQUIP) 0.5 MG tablet Give 0.5 mg by tube daily. 08/08/17 08/08/18 Yes [provider]  Sodium Chloride Flush (NORMAL SALINE FLUSH IV) Inject 10 mLs into the vein See admin instructions. Shifts days and nights   Yes [provider]  acetaminophen (TYLENOL) 160 MG/5ML solution Place 15.6 mLs (500 mg total) into feeding tube every 6 (six) hours as needed for mild pain, headache or fever. Patient not taking: Reported on 10/14/2017 09/25/17   Minor, Vilinda Blanks, NP  albuterol (PROVENTIL) (2.5 MG/3ML) 0.083% nebulizer solution Take 3 mLs (2.5 mg total) by nebulization every 2 (two) hours as needed for wheezing or shortness of breath. Patient not taking: Reported on 10/14/2017 09/25/17   Minor, Vilinda Blanks, NP  Water For Irrigation, Sterile (FREE WATER) SOLN  Place 300 mLs into feeding tube every 4 (four) hours. Patient not taking: Reported on 10/14/2017 09/25/17   Minor, Vilinda Blanks, NP    Family History No family history on file.  Social History Social History   Tobacco Use  . Smoking status: Former Smoker    Types: Cigarettes  . Smokeless tobacco: Never Used  Substance Use Topics  . Alcohol use: Not Currently  . Drug use: Never     Allergies   Wasp venom protein; Adhesive [tape]; Coconut oil; Latex; Robitussin severe multi-symp [[id number]]; and Shellfish-derived products   Review of Systems Review of Systems  Unable to perform ROS: Severe respiratory distress  Constitutional: Negative for fever.  Respiratory: Positive for sputum production, shortness of breath and wheezing.      Physical Exam Updated Vital Signs BP 138/78 (BP Location: Right Arm)   Pulse (!) 117   Temp 97.8 F (36.6 C) (Oral)   Resp 20   Ht 5\' 4"  (1.626 m)   Wt 83.9 kg (185 lb)   LMP  (LMP  Unknown)   SpO2 100%   BMI 31.76 kg/m   Physical Exam  Constitutional: She appears well-developed and well-nourished.  HENT:  Head: Normocephalic.  Eyes: Pupils are equal, round, and reactive to light.  Neck: Normal range of motion.  Cardiovascular: Exam reveals friction rub.  tachycardia  Pulmonary/Chest: She is in respiratory distress.  Minimal breath sounds bilat upper lobes   Abdominal: Soft.  Neurological: She is alert.  Psychiatric:  anxious     ED Treatments / Results  Labs (all labs ordered are listed, but only abnormal results are displayed) Labs Reviewed  COMPREHENSIVE METABOLIC PANEL - Abnormal; Notable for the following components:      Result Value   Chloride 97 (*)    Glucose, Bld 191 (*)    All other components within normal limits  CBC WITH DIFFERENTIAL/PLATELET - Abnormal; Notable for the following components:   WBC 34.3 (*)    Hemoglobin 11.2 (*)    MCH 24.5 (*)    MCHC 27.7 (*)    RDW 17.1 (*)    Neutro Abs 28.9 (*)    Monocytes Absolute 2.4 (*)    Eosinophils Absolute 1.7 (*)    Basophils Absolute 0.3 (*)    All other components within normal limits  I-STAT ARTERIAL BLOOD GAS, ED - Abnormal; Notable for the following components:   pH, Arterial 7.538 (*)    pO2, Arterial 193.0 (*)    Bicarbonate 32.3 (*)    TCO2 33 (*)    Acid-Base Excess 9.0 (*)    All other components within normal limits  CULTURE, BLOOD (ROUTINE X 2)  CULTURE, BLOOD (ROUTINE X 2)  URINALYSIS, ROUTINE W REFLEX MICROSCOPIC  BLOOD GAS, ARTERIAL  PROCALCITONIN  HEPARIN LEVEL (UNFRACTIONATED)  I-STAT CG4 LACTIC ACID, ED    EKG None  Radiology Ct Angio Chest Pe W And/or Wo Contrast  Result Date: 10/14/2017 CLINICAL DATA:  Shortness of breath and tachycardia EXAM: CT ANGIOGRAPHY CHEST WITH CONTRAST TECHNIQUE: Multidetector CT imaging of the chest was performed using the standard protocol during bolus administration of intravenous contrast. Multiplanar CT image  reconstructions and MIPs were obtained to evaluate the vascular anatomy. CONTRAST:  ISOVUE-370 IOPAMIDOL (ISOVUE-370) INJECTION 76% COMPARISON:  Chest CT angiogram Sep 16, 2017 and chest radiograph October 14, 2017 FINDINGS: Cardiovascular: There is a small incompletely obstructing pulmonary embolus in the proximal left lower lobe pulmonary artery. No completely obstructing pulmonary embolus seen on this study.  The right ventricle to left ventricle diameter ratio is less than 0.9, not consistent with right heart strain. There is no thoracic aortic aneurysm or dissection. The visualized great vessels appear unremarkable except for calcification in the proximal left subclavian artery. There are foci of calcification in the aorta. No pericardial effusion or pericardial thickening evident. Central catheter tip is in the superior vena cava. Mediastinum/Nodes: Thyroid appears unremarkable. There are subcentimeter mediastinal lymph nodes. There is no adenopathy by size criteria. No esophageal lesions are evident. Lungs/Pleura: There is extensive centrilobular and paraseptal emphysematous change. There is a large area of bullous disease throughout much of the left upper lobe and lingula. There is scarring in the right base. There is bilateral lower lobe as well as right middle lobe bronchiectatic change. No evident edema or consolidation. No pleural effusion or pleural thickening evident. Endotracheal tube tip is in the mid trachea.  No pneumothorax. Upper Abdomen: There is a small myelolipoma in the left adrenal measuring just over 1 x 1 cm. Visualized upper abdominal structures otherwise appear unremarkable. Musculoskeletal: No blastic or lytic bone lesions. No chest wall lesions are evident. Review of the MIP images confirms the above findings. IMPRESSION: 1. Small incompletely obstructing proximal left lower lobe pulmonary embolus. No right heart strain. No other pulmonary emboli evident. Note that the current  incompletely obstructing pulmonary embolus on the left is best seen on axial slices 89 through 94 series 5, coronal slices 97 and 98 series 8, and sagittal slices 114 through 117 series 9. 2. No thoracic aortic aneurysm or dissection. There is aortic and great vessel atherosclerosis. 3. Extensive underlying emphysematous change with areas of scarring and bronchiectasis, stable. No frank edema or consolidation evident. 4.  No appreciable adenopathy. 5.  Small benign left adrenal myelolipoma. Aortic Atherosclerosis (ICD10-I70.0) and Emphysema (ICD10-J43.9). Critical Value/emergent results were called by telephone at the time of interpretation on 10/14/2017 at 12:20 pm to [facility], [facility], who verbally acknowledged these results. Electronically Signed   By: Bretta Bang III M.D.   On: 10/14/2017 12:22   Dg Chest Portable 1 View  Result Date: 10/14/2017 CLINICAL DATA:  Shortness of breath, ventilator dependent with tracheostomy, hypertension, diabetes mellitus EXAM: PORTABLE CHEST 1 VIEW COMPARISON:  Portable exam 1038 hours compared to 09/25/2017 FINDINGS: Tracheostomy tube projects over tracheal air column. LEFT arm PICC line tip projects over SVC. Normal heart size, mediastinal contours, and pulmonary vascularity. Emphysematous and bronchitic changes consistent with COPD. Persistent interstitial prominence in the mid to lower RIGHT lung concerning for persistent infiltrate. Chronic blunting of the LEFT lateral costophrenic angle. No definite pleural effusion or pneumothorax. Bones demineralized. Atherosclerotic calcification noted at aortic arch. IMPRESSION: COPD changes with chronic blunting of LEFT costophrenic angle and suspected persistent mild interstitial infiltrate in the mid to lower RIGHT lung. When compared to the previous exam no significant change. Electronically Signed   By: Ulyses Southward M.D.   On: 10/14/2017 10:57    Procedures .Critical Care Performed by: Elson Areas, PA-C Authorized  by: Elson Areas, PA-C   Critical care provider statement:    Critical care time (minutes):  60   Critical care start time:  10/14/2017 11:00 AM   Critical care end time:  10/14/2017 1:02 PM   Critical care time was exclusive of:  Separately billable procedures and treating other patients and teaching time   Critical care was necessary to treat or prevent imminent or life-threatening deterioration of the following conditions:  Respiratory failure   Critical  care was time spent personally by me on the following activities:  Blood draw for specimens, development of treatment plan with patient or surrogate, discussions with consultants, evaluation of patient's response to treatment, examination of patient, obtaining history from patient or surrogate, interpretation of cardiac output measurements, ordering and performing treatments and interventions, ordering and review of laboratory studies, ordering and review of radiographic studies, pulse oximetry, re-evaluation of patient's condition, review of old charts and ventilator management   (including critical care time)  Medications Ordered in ED Medications  albuterol (PROVENTIL,VENTOLIN) solution continuous neb (10 mg/hr Nebulization New Bag/Given 10/14/17 1106)  iopamidol (ISOVUE-370) 76 % injection (has no administration in time range)  budesonide (PULMICORT) nebulizer solution 0.5 mg (has no administration in time range)  levalbuterol (XOPENEX) nebulizer solution 0.63 mg (has no administration in time range)  midodrine (PROAMATINE) tablet 5 mg (has no administration in time range)  methylPREDNISolone sodium succinate (SOLU-MEDROL) 40 mg/mL injection 40 mg (has no administration in time range)  free water 200 mL (has no administration in time range)  QUEtiapine (SEROQUEL) tablet 100 mg (has no administration in time range)  promethazine (PHENERGAN) injection 12.5 mg (has no administration in time range)  PARoxetine (PAXIL) tablet 30 mg (has no  administration in time range)  LORazepam (ATIVAN) injection 1 mg (has no administration in time range)  clonazePAM (KLONOPIN) tablet 1 mg (has no administration in time range)  alpha-1-proteinase inhibitor (human) (PROLASTIN) injection 5,034 mg (has no administration in time range)  rOPINIRole (REQUIP) tablet 0.5 mg (has no administration in time range)  insulin aspart (novoLOG) injection 0-15 Units (has no administration in time range)  0.9 %  sodium chloride infusion (has no administration in time range)  0.9 %  sodium chloride infusion (has no administration in time range)  famotidine (PEPCID) IVPB 20 mg premix (has no administration in time range)  heparin bolus via infusion 4,000 Units (has no administration in time range)  heparin ADULT infusion 100 units/mL (25000 units/230mL sodium chloride 0.45%) (has no administration in time range)  Ampicillin-Sulbactam (UNASYN) 3 g in sodium chloride 0.9 % 100 mL IVPB (has no administration in time range)  iopamidol (ISOVUE-370) 76 % injection 100 mL (100 mLs Intravenous Contrast Given 10/14/17 1205)     Initial Impression / Assessment and Plan / ED Course  I have reviewed the triage vital signs and the nursing notes.  Pertinent labs & imaging results that were available during my care of the patient were reviewed by me and considered in my medical decision making (see chart for details).     Respiratory at bedside to manage ventilator.  Pt given solumedrol.  1 hour continuous neb started.  Patient transported to CT scan for CT angios chest for possible pulmonary embolus.  While in CT patient had an episode of vomiting she vomited approximately 500 cc of fluid.  After vomiting patient had improvement of breathing.  I spoke with critical care. Rahul Celine Mans PA-C here to see and examine. Final Clinical Impressions(s) / ED Diagnoses   Final diagnoses:  Respiratory distress  Dependent on ventilator (HCC)  Acute pulmonary embolism with acute cor  pulmonale, unspecified pulmonary embolism type [facility])    ED Discharge Orders    None     Pt's care turned over to dritical care.    Osie Cheeks 10/14/17 1304    Gerhard Munch, MD 10/15/17 [phone]

## 2017-10-14 NOTE — ED Triage Notes (Signed)
Pt brought in by [facility] from [facility] due to being in respiratory distress. Pt has trach and is vent dependent. Pt is tachycardia in the 140's and RR is 40. Unknown is pt has had fevers.

## 2017-10-14 NOTE — ED Notes (Signed)
Dr.Rahul w/ CCM paged to [facility] RN @ [phone].

## 2017-10-14 NOTE — ED Notes (Signed)
Pt vomited clear mucous-like emesis. Rutherford Guys, PA notified. New orders received.

## 2017-10-15 ENCOUNTER — Inpatient Hospital Stay (HOSPITAL_COMMUNITY): Payer: Medicare Other

## 2017-10-15 DIAGNOSIS — I2699 Other pulmonary embolism without acute cor pulmonale: Secondary | ICD-10-CM

## 2017-10-15 DIAGNOSIS — R609 Edema, unspecified: Secondary | ICD-10-CM

## 2017-10-15 LAB — CBC
HCT: 30.3 % — ABNORMAL LOW (ref 36.0–46.0)
HCT: 30 % — ABNORMAL LOW (ref 36.0–46.0)
HEMATOCRIT: 29.3 % — AB (ref 36.0–46.0)
HEMOGLOBIN: 8.3 g/dL — AB (ref 12.0–15.0)
Hemoglobin: 8.5 g/dL — ABNORMAL LOW (ref 12.0–15.0)
Hemoglobin: 8.5 g/dL — ABNORMAL LOW (ref 12.0–15.0)
MCH: 24.2 pg — AB (ref 26.0–34.0)
MCH: 24.3 pg — ABNORMAL LOW (ref 26.0–34.0)
MCH: 24.6 pg — AB (ref 26.0–34.0)
MCHC: 28.1 g/dL — AB (ref 30.0–36.0)
MCHC: 28.3 g/dL — AB (ref 30.0–36.0)
MCHC: 28.3 g/dL — ABNORMAL LOW (ref 30.0–36.0)
MCV: 86.3 fL (ref 78.0–100.0)
MCV: 85.9 fL (ref 78.0–100.0)
MCV: 86.7 fL (ref 78.0–100.0)
PLATELETS: 113 10*3/uL — AB (ref 150–400)
Platelets: 117 10*3/uL — ABNORMAL LOW (ref 150–400)
Platelets: 114 10*3/uL — ABNORMAL LOW (ref 150–400)
RBC: 3.51 MIL/uL — ABNORMAL LOW (ref 3.87–5.11)
RBC: 3.41 MIL/uL — ABNORMAL LOW (ref 3.87–5.11)
RBC: 3.46 MIL/uL — AB (ref 3.87–5.11)
RDW: 17.2 % — AB (ref 11.5–15.5)
RDW: 17.1 % — AB (ref 11.5–15.5)
RDW: 17.2 % — AB (ref 11.5–15.5)
WBC: 13.8 10*3/uL — ABNORMAL HIGH (ref 4.0–10.5)
WBC: 12.2 10*3/uL — ABNORMAL HIGH (ref 4.0–10.5)
WBC: 10 10*3/uL (ref 4.0–10.5)

## 2017-10-15 LAB — BLOOD GAS, ARTERIAL
Acid-Base Excess: 10 mmol/L — ABNORMAL HIGH (ref 0.0–2.0)
Bicarbonate: 35.6 mmol/L — ABNORMAL HIGH (ref 20.0–28.0)
Drawn by: 100061
FIO2: 50
LHR: 24 {breaths}/min
O2 Saturation: 98.6 %
PATIENT TEMPERATURE: 98.6
PCO2 ART: 64.1 mmHg — AB (ref 32.0–48.0)
PEEP: 8 cmH2O
Pressure control: 30 cmH2O
pH, Arterial: 7.364 (ref 7.350–7.450)
pO2, Arterial: 125 mmHg — ABNORMAL HIGH (ref 83.0–108.0)

## 2017-10-15 LAB — GLUCOSE, CAPILLARY
GLUCOSE-CAPILLARY: 104 mg/dL — AB (ref 65–99)
GLUCOSE-CAPILLARY: 111 mg/dL — AB (ref 65–99)
GLUCOSE-CAPILLARY: 152 mg/dL — AB (ref 65–99)
Glucose-Capillary: 109 mg/dL — ABNORMAL HIGH (ref 65–99)
Glucose-Capillary: 97 mg/dL (ref 65–99)
Glucose-Capillary: 125 mg/dL — ABNORMAL HIGH (ref 65–99)
Glucose-Capillary: 95 mg/dL (ref 65–99)

## 2017-10-15 LAB — BASIC METABOLIC PANEL
Anion gap: 8 (ref 5–15)
BUN: 13 mg/dL (ref 6–20)
CALCIUM: 8.9 mg/dL (ref 8.9–10.3)
CO2: 34 mmol/L — ABNORMAL HIGH (ref 22–32)
CREATININE: 0.5 mg/dL (ref 0.44–1.00)
Chloride: 100 mmol/L — ABNORMAL LOW (ref 101–111)
GFR calc Af Amer: >60 mL/min (ref >60)
Glucose, Bld: 114 mg/dL — ABNORMAL HIGH (ref 65–99)
Potassium: 4 mmol/L (ref 3.5–5.1)
SODIUM: 142 mmol/L (ref 135–145)

## 2017-10-15 LAB — MAGNESIUM
MAGNESIUM: 1.9 mg/dL (ref 1.7–2.4)
Magnesium: 2 mg/dL (ref 1.7–2.4)

## 2017-10-15 LAB — HEPARIN LEVEL (UNFRACTIONATED)
HEPARIN UNFRACTIONATED: 0.2 [IU]/mL — AB (ref 0.30–0.70)
Heparin Unfractionated: 0.49 IU/mL (ref 0.30–0.70)

## 2017-10-15 LAB — PHOSPHORUS
PHOSPHORUS: 3.6 mg/dL (ref 2.5–4.6)
PHOSPHORUS: 3.1 mg/dL (ref 2.5–4.6)

## 2017-10-15 LAB — PROCALCITONIN: Procalcitonin: 35.8 ng/mL

## 2017-10-15 MED ORDER — VITAL HIGH PROTEIN PO LIQD
1000.0000 mL | ORAL | Status: DC
  Administered 2017-10-15 – 2017-10-20 (×6): 1000 mL

## 2017-10-15 MED ORDER — VITAL HIGH PROTEIN PO LIQD: 1000.0000 mL | ORAL | Status: DC

## 2017-10-15 MED ORDER — PRO-STAT SUGAR FREE PO LIQD: 30.0000 mL | Freq: Two times a day (BID) | ORAL | Status: DC

## 2017-10-15 MED ORDER — SODIUM CHLORIDE 0.9 % IV BOLUS
500.0000 mL | Freq: Once | INTRAVENOUS | Status: AC
  Administered 2017-10-15: 500 mL via INTRAVENOUS

## 2017-10-15 MED ORDER — HEPARIN BOLUS VIA INFUSION
1000.0000 [IU] | Freq: Once | INTRAVENOUS | Status: AC
  Administered 2017-10-15: 1000 [IU] via INTRAVENOUS
  Filled 2017-10-15: qty 1000

## 2017-10-15 NOTE — Progress Notes (Addendum)
Initial Nutrition Assessment  DOCUMENTATION CODES:   Obesity unspecified  INTERVENTION:    Vital High Protein at 50 ml/h (1200 ml per day)   Provides 1200 kcal, 105 gm protein, 1003 ml free water daily  NUTRITION DIAGNOSIS:   Inadequate oral intake related to inability to eat as evidenced by NPO status.  GOAL:   Provide needs based on ASPEN/SCCM guidelines  MONITOR:   Vent status, TF tolerance, Labs, I & O's  REASON FOR ASSESSMENT:   Ventilator, Consult Enteral/tube feeding initiation and management  ASSESSMENT:   47 yo female with PMH of COPD, DM, HTN, anxiety, alpha-1-antitrypsin deficiency, anemia, asthma, emphysema, trach/vent/PEG who was admitted from [facility] on 6/18 with acute pulmonary embolism and aspiration PNA.  Discussed patient in ICU rounds and with RN today. Received MD Consult for TF initiation and management. PEG in place. Unsure of usual TF regimen. Vomited x 1 last night, none today. Patient is currently intubated on ventilator support Temp (24hrs), Avg:98.9 F (37.2 C), Min:98.1 F (36.7 C), Max:99.7 F (37.6 C)  Propofol: none Labs reviewed. CBG's: 109-97 Medications reviewed and include novolog and solumedrol.   NUTRITION - FOCUSED PHYSICAL EXAM:    Most Recent Value  Orbital Region  No depletion  Upper Arm Region  No depletion  Thoracic and Lumbar Region  Unable to assess  Buccal Region  Unable to assess  Temple Region  No depletion  Clavicle Bone Region  No depletion  Clavicle and Acromion Bone Region  No depletion  Scapular Bone Region  Unable to assess  Dorsal Hand  Unable to assess  Patellar Region  No depletion  Anterior Thigh Region  No depletion  Posterior Calf Region  No depletion  Edema (RD Assessment)  Mild  Hair  Reviewed  Eyes  Unable to assess  Mouth  Unable to assess  Skin  Reviewed  Nails  Unable to assess       Diet Order:   Diet Order           Diet NPO time specified  Diet effective now           EDUCATION NEEDS:   No education needs have been identified at this time  Skin:  Skin Assessment: Reviewed RN Assessment  Last BM:  6/19  Height:   Ht Readings from Last 1 Encounters:  10/14/17 5\' 4"  (1.626 m)    Weight:   Wt Readings from Last 1 Encounters:  10/15/17 186 lb 15.2 oz (84.8 kg)    Ideal Body Weight:  54.5 kg  BMI:  Body mass index is 32.09 kg/m.  Estimated Nutritional Needs:   Kcal:  1000-1200  Protein:  109 gm  Fluid:  1.5 L    Joaquin Courts, RD, LDN, CNSC Pager [phone] After Hours Pager [phone]

## 2017-10-15 NOTE — Progress Notes (Signed)
[facility] Physician-Brief Progress Note Patient Name: Danielle Stevens DOB: 1970/11/25 MRN: [id number]   Date of Service  10/15/2017  HPI/Events of Note  diarrhea  eICU Interventions  flexiseal Check C diff     Intervention Category Intermediate Interventions: Other:  Kalman Shan 10/15/2017, 11:55 PM

## 2017-10-15 NOTE — Progress Notes (Signed)
PULMONARY / CRITICAL CARE MEDICINE   Name: Danielle Stevens MRN: [id number] DOB: 09/05/1970    ADMISSION DATE:  10/14/2017 CONSULTATION DATE:  10/14/17  REFERRING MD:  [facility]  CHIEF COMPLAINT:  Respiratory Distress  HISTORY OF PRESENT ILLNESS: Danielle Stevens is a 47 y.o. 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 She had recent admission to [facility] 08/22/17 through 09/25/17 for acute on chronic hypercapnic respiratory failure.  She was treated with BDs, steroids, continued on Prolastin weekly and had vent settings adjusted.  She was then [facility] on 5/30.  On 6/18, she had respiratory distress with RR in 40s and HR in 140s.  ABG at [facility] demonstrated respiratory acidosis / hypercapnia (7.11 / 163 / 80).  She was subsequently brought to [facility] for further evaluation and management.  She was taken for CTA (which revealed small PE) and while there, had vomiting episode of 500-700cc, appeared like clear mucus.  Question whether she had aspiration while at [facility]. After vomiting episode, her RR improved to low 20s and HR down to 110-120.  Per RN and RT staff, pt also appears much better and pt nodding her head yes when asked if she feels better.   SUBJECTIVE:  No c/o.  Hgb trending down.  No s/s bleeding   VITAL SIGNS: BP 116/74   Pulse 81   Temp 98.9 F (37.2 C) (Oral)   Resp (!) 26   Ht 5\' 4"  (1.626 m)   Wt 84.8 kg (186 lb 15.2 oz)   LMP  (LMP Unknown)   SpO2 100%   BMI 32.09 kg/m   HEMODYNAMICS:    VENTILATOR SETTINGS: Vent Mode: PCV FiO2 (%):  [50 %-100 %] 50 % Set Rate:  [24 bmp-28 bmp] 24 bmp PEEP:  [8 cmH20] 8 cmH20 Plateau Pressure:  [25 cmH20-30 cmH20] 30 cmH20  INTAKE / OUTPUT: I/O last 3 completed shifts: In: 1275.4 [I.V.:875.7; NG/GT:200; IV Piggyback:199.7] Out: 1050 [Urine:1050]   PHYSICAL  EXAMINATION: General: chronically ill appearing female, NAD on vent  Neuro: awake, alert, nods appropriately, MAE  HEENT: mm moist, trach c/d. Cardiovascular: s1s2 rrr  Lungs: resps even non labored on vent, diminished bases otherwise clear  Abdomen: slightly distended, obese, soft, PEG c/d  Musculoskeletal: no sig edema, mild RUE swelling  Skin: Intact, warm, no rashes.   LABS:  BMET Recent Labs  Lab 10/14/17 1040 10/15/17 0534  NA 139 142  K 4.2 4.0  CL 97* 100*  CO2 32 34*  BUN 19 13  CREATININE 0.72 0.50  GLUCOSE 191* 114*    Electrolytes Recent Labs  Lab 10/14/17 1040 10/15/17 0534  CALCIUM 9.5 8.9  MG  --  2.0  PHOS  --  3.6    CBC Recent Labs  Lab 10/14/17 1040 10/15/17 0534 10/15/17 0832  WBC 34.3* 13.8* 12.2*  HGB 11.2* 8.5* 8.3*  HCT 40.4 30.3* 29.3*  PLT 311 117* 114*    Coag's No results for input(s): APTT, INR in the last 168 hours.  Sepsis Markers Recent Labs  Lab 10/14/17 1040 10/14/17 1050 10/15/17 0534  LATICACIDVEN  --  0.65  --   PROCALCITON 3.82  --  35.80    ABG Recent Labs  Lab 10/14/17 1226 10/15/17 0445  PHART 7.538* 7.364  PCO2ART 37.9 64.1*  PO2ART 193.0* 125*    Liver Enzymes Recent Labs  Lab 10/14/17 1040  AST 40  ALT  47  ALKPHOS 83  BILITOT 1.0  ALBUMIN 3.9    Cardiac Enzymes No results for input(s): TROPONINI, PROBNP in the last 168 hours.  Glucose Recent Labs  Lab 10/14/17 1658 10/14/17 2029 10/15/17 0004 10/15/17 0358 10/15/17 0749  GLUCAP 160* 185* 152* 104* 109*    Imaging Ct Angio Chest Pe W And/or Wo Contrast  Result Date: 10/14/2017 CLINICAL DATA:  Shortness of breath and tachycardia EXAM: CT ANGIOGRAPHY CHEST WITH CONTRAST TECHNIQUE: Multidetector CT imaging of the chest was performed using the standard protocol during bolus administration of intravenous contrast. Multiplanar CT image reconstructions and MIPs were obtained to evaluate the vascular anatomy. CONTRAST:   ISOVUE-370 IOPAMIDOL (ISOVUE-370) INJECTION 76% COMPARISON:  Chest CT angiogram Sep 16, 2017 and chest radiograph October 14, 2017 FINDINGS: Cardiovascular: There is a small incompletely obstructing pulmonary embolus in the proximal left lower lobe pulmonary artery. No completely obstructing pulmonary embolus seen on this study. The right ventricle to left ventricle diameter ratio is less than 0.9, not consistent with right heart strain. There is no thoracic aortic aneurysm or dissection. The visualized great vessels appear unremarkable except for calcification in the proximal left subclavian artery. There are foci of calcification in the aorta. No pericardial effusion or pericardial thickening evident. Central catheter tip is in the superior vena cava. Mediastinum/Nodes: Thyroid appears unremarkable. There are subcentimeter mediastinal lymph nodes. There is no adenopathy by size criteria. No esophageal lesions are evident. Lungs/Pleura: There is extensive centrilobular and paraseptal emphysematous change. There is a large area of bullous disease throughout much of the left upper lobe and lingula. There is scarring in the right base. There is bilateral lower lobe as well as right middle lobe bronchiectatic change. No evident edema or consolidation. No pleural effusion or pleural thickening evident. Endotracheal tube tip is in the mid trachea.  No pneumothorax. Upper Abdomen: There is a small myelolipoma in the left adrenal measuring just over 1 x 1 cm. Visualized upper abdominal structures otherwise appear unremarkable. Musculoskeletal: No blastic or lytic bone lesions. No chest wall lesions are evident. Review of the MIP images confirms the above findings. IMPRESSION: 1. Small incompletely obstructing proximal left lower lobe pulmonary embolus. No right heart strain. No other pulmonary emboli evident. Note that the current incompletely obstructing pulmonary embolus on the left is best seen on axial slices 89 through  94 series 5, coronal slices 97 and 98 series 8, and sagittal slices 114 through 117 series 9. 2. No thoracic aortic aneurysm or dissection. There is aortic and great vessel atherosclerosis. 3. Extensive underlying emphysematous change with areas of scarring and bronchiectasis, stable. No frank edema or consolidation evident. 4.  No appreciable adenopathy. 5.  Small benign left adrenal myelolipoma. Aortic Atherosclerosis (ICD10-I70.0) and Emphysema (ICD10-J43.9). Critical Value/emergent results were called by telephone at the time of interpretation on 10/14/2017 at 12:20 pm to [facility], who verbally acknowledged these results. Electronically Signed   By: Bretta Bang III M.D.   On: 10/14/2017 12:22   Dg Abdomen Peg Tube Location  Result Date: 10/14/2017 CLINICAL DATA:  Peg placement EXAM: ABDOMEN - 1 VIEW COMPARISON:  None. FINDINGS: Gastrostomy tube was injected with 30 mL Isovue-300. Contrast in the gastric fundus. No extravasation. Normal bowel gas pattern. IMPRESSION: Gastrostomy tube in the body the stomach. Electronically Signed   By: Marlan Palau M.D.   On: 10/14/2017 15:34   Dg Chest Port 1 View  Result Date: 10/15/2017 CLINICAL DATA:  Respiratory failure, shortness of breath. EXAM: PORTABLE  CHEST 1 VIEW COMPARISON:  CT scan chest of October 14, 2017 and portable chest x-ray of the same day. FINDINGS: The lungs remain hyperinflated and hyperlucent. Minimal blunting of the lateral costophrenic angles is present and stable. There are coarse lung markings in the right perihilar and infrahilar regions which are stable. The heart and pulmonary vascularity are normal. There is calcification in the wall of the aortic arch. The endotracheal tube tip projects approximately 9 cm above the carina and is located at the level of the clavicular heads. The left-sided PICC line tip projects over the midportion of the SVC. IMPRESSION: COPD. Chronic interstitial changes in the right perihilar region. No  acute pneumonia nor pulmonary edema. Thoracic aortic atherosclerosis. The support tubes are in reasonable position. Electronically Signed   By: David  [place] M.D.   On: 10/15/2017 07:47   Dg Chest Portable 1 View  Result Date: 10/14/2017 CLINICAL DATA:  Shortness of breath, ventilator dependent with tracheostomy, hypertension, diabetes mellitus EXAM: PORTABLE CHEST 1 VIEW COMPARISON:  Portable exam 1038 hours compared to 09/25/2017 FINDINGS: Tracheostomy tube projects over tracheal air column. LEFT arm PICC line tip projects over SVC. Normal heart size, mediastinal contours, and pulmonary vascularity. Emphysematous and bronchitic changes consistent with COPD. Persistent interstitial prominence in the mid to lower RIGHT lung concerning for persistent infiltrate. Chronic blunting of the LEFT lateral costophrenic angle. No definite pleural effusion or pneumothorax. Bones demineralized. Atherosclerotic calcification noted at aortic arch. IMPRESSION: COPD changes with chronic blunting of LEFT costophrenic angle and suspected persistent mild interstitial infiltrate in the mid to lower RIGHT lung. When compared to the previous exam no significant change. Electronically Signed   By: Ulyses Southward M.D.   On: 10/14/2017 10:57     STUDIES:  CXR 6/18 > chronic emphysematous changes. CTA chest 6/18 > small LL PE.  Question small right PE as well (though not mentioned on report).  No heart strain.  Extensive underlying emphysematous changes. LE duplex 6/18 >   CULTURES: Blood 6/18 >  Sputum 6/18 > enterobacter>>>  ANTIBIOTICS: Unasyn 6/18 > 6/18 Cefepime 6/18>>>  SIGNIFICANT EVENTS: 6/18 > admit.  LINES/TUBES: Trach > L PICC 5/6 >   DISCUSSION: 47 y.o. female with hx A1AT deficiency, COPD, chronic hypoxic / hypercapnic respiratory failure s/p trach and chronically vent dependent.  Recent admit 4/26 through 5/30 then discharged to [facility]. Re-admitted 6/18 with respiratory distress and AoC hypercapnic  respiratory failure.  ASSESSMENT / PLAN:  PULMONARY A: Acute on chronic hypoxic and hypercapnic respiratory failure  Chronic vent dependence  Small left sided PE - question small right as well though not noted on radiology report. Probable aspiration in setting emesis  COPD with emphysema and hx A1AT deficiency - not a transplant candidate per [facility] and [facility] after inquiry on last admit. HCAP - enterobacter  P:   Vent support - 8cc/kg  F/u CXR  F/u ABG VAP prevention  abx as above  Heparin per pharmacy  continue prolastin  Continue BDs, steroids    CARDIOVASCULAR A:  Tachycardia - improving. Hx chronic hypotension (on midodrine). P:  Monitor clinically. Continue preadmission midodrine.  RENAL A: No acute issues. P: NS @ 75. Follow BMP.  GASTROINTESTINAL A:   Nausea with vomiting. Nutrition. P:   Phenergan PRN. NPO. Hold TF's for now given nausea and vomiting - if no further vomiting resume TF 6/20  HEMATOLOGIC A:   PE  Anemia  Thrombocytopenia  P:  Heparin gtt per pharmacy  Monitor for s/s bleeding  Recheck CBC at 2pm and in am  BLE dopplers pending   INFECTIOUS A:   Probable aspiration - hx of vomiting. P:   Abx as above - changed to cefepime for enterobacter HCAP Follow cultures as above. PCT algorithm to limit abx exposure.  ENDOCRINE A: Hx DM. P: SSI.  NEUROLOGIC A:   Hx Anxiety. P:   Continue preadmission Klonopin, Lorazepam, Paroxetine, Quetiapine, Ropinirole.  Family updated: None available 6/19  Interdisciplinary Family Meeting v Palliative Care Meeting:  Due by: 10/21/17.   Monitor CBC, BP.  If remains stable without s/s bleeding can likely tx to SDU and TRH 6/20 with chronic stable vent needs.    Dirk Dress, NP 10/15/2017  10:44 AM Pager: [phone] or [phone]

## 2017-10-15 NOTE — Progress Notes (Signed)
*  Preliminary Results* Bilateral lower extremity venous duplex completed. There is no obvious evidence of occlusive deep vein thrombosis involving bilateral lower extremities. There is no evidence of Baker's cyst bilaterally.  10/15/2017 3:24 PM Gertie Fey, BS, RVT, RDCS, RDMS

## 2017-10-15 NOTE — Progress Notes (Signed)
ANTICOAGULATION CONSULT NOTE - Follow Up Consult  Pharmacy Consult for heparin Indication: pulmonary embolus  Allergies  Allergen Reactions  . Wasp Venom Protein Shortness Of Breath  . Adhesive [Tape] Other (See Comments)    Bruises   . Coconut Oil Hives  . Latex Other (See Comments)    Bruises   . Robitussin Severe Multi-Symp [Phenylephrine-Dm-Gg-Apap] Diarrhea and Other (See Comments)    Allergic, per verbal MAR  . Shellfish-Derived Products Hives and Other (See Comments)    Allergic, per verbal MAR    Patient Measurements: Height: 5\' 4"  (162.6 cm) Weight: 186 lb 15.2 oz (84.8 kg) IBW/kg (Calculated) : 54.7 Heparin Dosing Weight: 73kg  Vital Signs: Temp: 98.5 F (36.9 C) (06/19 1533) Temp Source: Oral (06/19 1533) BP: 120/69 (06/19 1900) Pulse Rate: 79 (06/19 1900)  Labs: Recent Labs    10/14/17 1040 10/14/17 2135 10/15/17 0534 10/15/17 0832 10/15/17 1319 10/15/17 1808  HGB 11.2*  --  8.5* 8.3* 8.5*  --   HCT 40.4  --  30.3* 29.3* 30.0*  --   PLT 311  --  117* 114* 113*  --   HEPARINUNFRC  --  0.26* 0.20*  --   --  0.49  CREATININE 0.72  --  0.50  --   --   --     Estimated Creatinine Clearance: 90.6 mL/min (by C-G formula based on SCr of 0.5 mg/dL).   Medical History: Past Medical History:  Diagnosis Date  . Alpha-1-antitrypsin deficiency (HCC)   . Anemia   . Anxiety   . Asthma   . COPD (chronic obstructive pulmonary disease) (HCC)   . Diabetes mellitus without complication (HCC)   . Emphysema lung (HCC)   . Hypertension     Assessment: 62 yoF admitted with respiratory distress found to have acute LLL PE without R heart strain. Per nursing home [facility] pt received LMWH yesterday for unclear reasons, no other anticoagulants noted.  Today, Hgb dropped from 11.2 to 8.5. No overt s/s of bleeding noted. Repeat CBC remains low.  Platelets this morning have dropped from 311k to 117k. Patient had a HIT panel collected on 09/10/2017 which was negative.  Discussed with CCM team who thinks this might be dilutional and continue Heparin.   Heparin level this PM is therapeutic at 0.49 on 1500 units/hr. LE doppler was negative for DVT. No bleeding reported.   Goal of Therapy:  Heparin level 0.3-0.7 units/ml Monitor platelets by anticoagulation protocol: Yes   Plan:  -Continue heparin at 1500 units/hr -Monitor heparin level, CBC, S/Sx bleeding daily  Link Snuffer, PharmD, BCPS, BCCCP Clinical Pharmacist Clinical phone 10/15/2017 until 11PM - [phone] After hours, please call #[phone] 10/15/2017, 7:45 PM

## 2017-10-15 NOTE — Progress Notes (Signed)
ANTICOAGULATION CONSULT NOTE - Follow Up Consult  Pharmacy Consult for heparin Indication: pulmonary embolus  Allergies  Allergen Reactions  . Wasp Venom Protein Shortness Of Breath  . Adhesive [Tape] Other (See Comments)    Bruises   . Coconut Oil Hives  . Latex Other (See Comments)    Bruises   . Robitussin Severe Multi-Symp [Phenylephrine-Dm-Gg-Apap] Diarrhea and Other (See Comments)    Allergic, per verbal MAR  . Shellfish-Derived Products Hives and Other (See Comments)    Allergic, per verbal MAR    Patient Measurements: Height: 5\' 4"  (162.6 cm) Weight: 186 lb 15.2 oz (84.8 kg) IBW/kg (Calculated) : 54.7 Heparin Dosing Weight: 73kg  Vital Signs: Temp: 98.9 F (37.2 C) (06/19 0750) Temp Source: Oral (06/19 0750) BP: 105/71 (06/19 0205) Pulse Rate: 94 (06/19 0645)  Labs: Recent Labs    10/14/17 1040 10/14/17 2135 10/15/17 0534  HGB 11.2*  --  8.5*  HCT 40.4  --  30.3*  PLT 311  --  PENDING  HEPARINUNFRC  --  0.26* 0.20*  CREATININE 0.72  --  0.50    Estimated Creatinine Clearance: 90.6 mL/min (by C-G formula based on SCr of 0.5 mg/dL).   Medical History: Past Medical History:  Diagnosis Date  . Alpha-1-antitrypsin deficiency (HCC)   . Anemia   . Anxiety   . Asthma   . COPD (chronic obstructive pulmonary disease) (HCC)   . Diabetes mellitus without complication (HCC)   . Emphysema lung (HCC)   . Hypertension     Assessment: 1 yoF admitted with respiratory distress found to have acute LLL PE without R heart strain. Per nursing home [facility] pt received LMWH yesterday for unclear reasons, no other anticoagulants noted.  Heparin level this morning remains subtherapeutic at 0.2 despite a rate increase last night. Of note, her Hgb dropped from 11.2 to 8.5. No overt s/s of bleeding noted. Repeat CBC remains low. Patient communicated that her breathing feels better this morning. Platelets this morning have dropped from 311k to 117k. Patient had a HIT panel  collected on 09/10/2017 which was negative. Discussed with CCM team who thinks this might be dilutional   Goal of Therapy:  Heparin level 0.3-0.7 units/ml Monitor platelets by anticoagulation protocol: Yes   Plan:  -Give heparin 1000 units IV bolus, then increase heparin to 1500 units/hr -Check 6-hr heparin level -Monitor heparin level, CBC, S/Sx bleeding daily  Vinnie Level, PharmD., BCPS Clinical Pharmacist Clinical phone for 10/15/17 until 3:30pm: [phone] If after 3:30pm, please call main pharmacy at: [phone]

## 2017-10-16 ENCOUNTER — Inpatient Hospital Stay (HOSPITAL_COMMUNITY): Payer: Medicare Other

## 2017-10-16 DIAGNOSIS — D696 Thrombocytopenia, unspecified: Secondary | ICD-10-CM

## 2017-10-16 DIAGNOSIS — J156 Pneumonia due to other aerobic Gram-negative bacteria: Secondary | ICD-10-CM

## 2017-10-16 DIAGNOSIS — I9589 Other hypotension: Secondary | ICD-10-CM

## 2017-10-16 DIAGNOSIS — E119 Type 2 diabetes mellitus without complications: Secondary | ICD-10-CM

## 2017-10-16 DIAGNOSIS — D649 Anemia, unspecified: Secondary | ICD-10-CM

## 2017-10-16 DIAGNOSIS — J189 Pneumonia, unspecified organism: Secondary | ICD-10-CM

## 2017-10-16 DIAGNOSIS — F419 Anxiety disorder, unspecified: Secondary | ICD-10-CM

## 2017-10-16 LAB — CBC
HCT: 30.1 % — ABNORMAL LOW (ref 36.0–46.0)
Hemoglobin: 8.5 g/dL — ABNORMAL LOW (ref 12.0–15.0)
MCH: 24.3 pg — AB (ref 26.0–34.0)
MCHC: 28.2 g/dL — AB (ref 30.0–36.0)
MCV: 86 fL (ref 78.0–100.0)
Platelets: 135 10*3/uL — ABNORMAL LOW (ref 150–400)
RBC: 3.5 MIL/uL — AB (ref 3.87–5.11)
RDW: 16.9 % — ABNORMAL HIGH (ref 11.5–15.5)
WBC: 8.1 10*3/uL (ref 4.0–10.5)

## 2017-10-16 LAB — HEPARIN LEVEL (UNFRACTIONATED): Heparin Unfractionated: 0.44 IU/mL (ref 0.30–0.70)

## 2017-10-16 LAB — BASIC METABOLIC PANEL
ANION GAP: 6 (ref 5–15)
BUN: 17 mg/dL (ref 6–20)
CO2: 33 mmol/L — AB (ref 22–32)
Calcium: 9.3 mg/dL (ref 8.9–10.3)
Chloride: 103 mmol/L (ref 101–111)
Creatinine, Ser: 0.47 mg/dL (ref 0.44–1.00)
GFR calc Af Amer: >60 mL/min (ref >60)
GFR calc non Af Amer: >60 mL/min (ref >60)
GLUCOSE: 161 mg/dL — AB (ref 65–99)
POTASSIUM: 4.2 mmol/L (ref 3.5–5.1)
Sodium: 142 mmol/L (ref 135–145)

## 2017-10-16 LAB — PROCALCITONIN: Procalcitonin: 24.46 ng/mL

## 2017-10-16 LAB — GLUCOSE, CAPILLARY
GLUCOSE-CAPILLARY: 120 mg/dL — AB (ref 65–99)
Glucose-Capillary: 113 mg/dL — ABNORMAL HIGH (ref 65–99)
Glucose-Capillary: 162 mg/dL — ABNORMAL HIGH (ref 65–99)
Glucose-Capillary: 148 mg/dL — ABNORMAL HIGH (ref 65–99)
Glucose-Capillary: 179 mg/dL — ABNORMAL HIGH (ref 65–99)
Glucose-Capillary: 133 mg/dL — ABNORMAL HIGH (ref 65–99)

## 2017-10-16 LAB — CULTURE, BLOOD (ROUTINE X 2): SPECIAL REQUESTS: ADEQUATE

## 2017-10-16 LAB — C DIFFICILE QUICK SCREEN W PCR REFLEX
C Diff antigen: NEGATIVE
C Diff interpretation: NOT DETECTED
C Diff toxin: NEGATIVE

## 2017-10-16 LAB — MAGNESIUM: Magnesium: 1.9 mg/dL (ref 1.7–2.4)

## 2017-10-16 LAB — PHOSPHORUS: Phosphorus: 3 mg/dL (ref 2.5–4.6)

## 2017-10-16 NOTE — Progress Notes (Signed)
ANTICOAGULATION CONSULT NOTE - Follow Up Consult  Pharmacy Consult for heparin Indication: pulmonary embolus  Allergies  Allergen Reactions  . Wasp Venom Protein Shortness Of Breath  . Adhesive [Tape] Other (See Comments)    Bruises   . Coconut Oil Hives  . Latex Other (See Comments)    Bruises   . Robitussin Severe Multi-Symp [Phenylephrine-Dm-Gg-Apap] Diarrhea and Other (See Comments)    Allergic, per verbal MAR  . Shellfish-Derived Products Hives and Other (See Comments)    Allergic, per verbal MAR    Patient Measurements: Height: 5\' 4"  (162.6 cm) Weight: 195 lb 8.8 oz (88.7 kg) IBW/kg (Calculated) : 54.7 Heparin Dosing Weight: 73kg  Vital Signs: Temp: 98.4 F (36.9 C) (06/20 0755) Temp Source: Oral (06/20 0755) BP: 97/54 (06/20 0756) Pulse Rate: 78 (06/20 0756)  Labs: Recent Labs    10/14/17 1040  10/15/17 0534 10/15/17 0832 10/15/17 1319 10/15/17 1808 10/16/17 0512  HGB 11.2*  --  8.5* 8.3* 8.5*  --  8.5*  HCT 40.4  --  30.3* 29.3* 30.0*  --  30.1*  PLT 311  --  117* 114* 113*  --  135*  HEPARINUNFRC  --    < > 0.20*  --   --  0.49 0.44  CREATININE 0.72  --  0.50  --   --   --  0.47   < > = values in this interval not displayed.    Estimated Creatinine Clearance: 92.7 mL/min (by C-G formula based on SCr of 0.47 mg/dL).   Medical History: Past Medical History:  Diagnosis Date  . Alpha-1-antitrypsin deficiency (HCC)   . Anemia   . Anxiety   . Asthma   . COPD (chronic obstructive pulmonary disease) (HCC)   . Diabetes mellitus without complication (HCC)   . Emphysema lung (HCC)   . Hypertension     Assessment: 8 yoF admitted with respiratory distress found to have acute LLL PE without R heart strain.   Heparin level this AM remains therapeutic at 0.44 on 1500 units/hr. No s/s of bleeding this AM. H/H stable. Plt improved.   Goal of Therapy:  Heparin level 0.3-0.7 units/ml Monitor platelets by anticoagulation protocol: Yes   Plan:   -Continue heparin at 1500 units/hr -Monitor heparin level, CBC, S/Sx bleeding daily  Vinnie Level, PharmD., BCPS Clinical Pharmacist Clinical phone for 10/16/17 until 3:30pm: [phone] If after 3:30pm, please call main pharmacy at: [phone]

## 2017-10-16 NOTE — Progress Notes (Signed)
PROGRESS NOTE    Danielle Stevens  DXI:[id number] DOB: 11/24/1970 DOA: 10/14/2017 PCP: Patient, No Pcp Per   Brief Narrative:  47 y.o. 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  Diabetes type 2 She had recent admission to [facility] 08/22/17 through 09/25/17 for acute on chronic hypercapnic respiratory failure.  She was treated with BDs, steroids, continued on Prolastin weekly and had vent settings adjusted.  She was then discharged back to [facility] on 5/30.   On 6/18, she had respiratory distress with RR in 40s and HR in 140s.  ABG at [facility] demonstrated respiratory acidosis / hypercapnia (7.11 / 163 / 80).  She was subsequently brought to [facility] for further evaluation and management.   She was taken for CTA (which revealed small PE) and while there, had vomiting episode of 500-700cc, appeared like clear mucus.  Question whether she had aspiration while at [place]. After vomiting episode, her RR improved to low 20s and HR down to 110-120.  Per RN and RT staff, pt also appears much better and pt nodding her head yes when asked if she feels better.   Subjective: 6/20 somnolent (patient just received medication).   Assessment & Plan:   Active Problems:   Acute on chronic respiratory failure (HCC)    Acute on chronic respiratory failure with hypoxia and hypercapnia/aspiration/positive Enterobacter HCAP -Complete course of antibiotic - Pulmicort BID - Xopenex PRN -Solu-Medrol 40 mg BID - Aggressive pulmonary toilet  COPD exacerbation/Alpha-1 Antitrypsin deficiency - Per epic note not transplant candidate per [facility] and [facility] after inquiry on last admit  Acute PE LEFT sided/RIGHT PE? - Heparin per pharmacy - Lower extremity Doppler negative for DVT see results below  Chronic vent dependent - Spoke with respiratory tech and no weaning trials at [facility] have  been conducted.  Once patient's respiratory status improved may want to begin Daily weaning trials._  Chronic Hypotension -Midodrine 5 mg TID  Nausea with vomiting -No complaints today. -Phenergan as needed    Chronic multifactorial normocytic anemia -On heparin drip per pharmacy -No signs of overt bleeding - Monitor closely.  Stable  Thrombocytopenia -Improving   Diabetes type 2 controlled without complication -7/67 hemoglobin A1c= 5.1   Severe Anxiety. - Clonazepam 1 mg TID -Ativan as needed - Paxil 30 mg daily - Seroquel 100 mg BID -Ropinirole 0.5 mg daily  Diarrhea -C. difficile negative.  Most likely secondary to feed, however now the patient on antibiotics monitor closely     Goals of care -6/20 PALLIATIVE CARE: Patient frequent admissions, overall poor quality of health discuss change of CODE STATUS to DNR, short-term vs long-term goals of care.   DVT prophylaxis: Heparin drip Code Status: Full Family Communication: None Disposition Plan: TBD   Consultants:  PCCM    Procedures/Significant Events:  6/19 admit 6/19 bilateral lower extremity Doppler: Negative DVT   I have personally reviewed and interpreted all radiology studies and my findings are as above.  VENTILATOR SETTINGS: PCV Set rate: 24 FiO2: 40% Pressure control: 30 cm H2O Peep: 8 cm H2O    Cultures 6/18 blood positive Enterobacter Cloacae 6/18 MRSA by PCR negative 6/20 C. difficile antigen negative/toxin negative    Antimicrobials: Anti-infectives (From admission, onward)   Start     Stop   10/14/17 2300  ceFEPIme (MAXIPIME) 2 g in sodium chloride 0.9 % 100 mL IVPB         10/14/17 1300  Ampicillin-Sulbactam (UNASYN) 3 g in sodium chloride 0.9 % 100 mL IVPB  Status:  Discontinued     10/14/17 2223       Devices   LINES / TUBES:  6 mm cuffed trach 4/26>>>    Continuous Infusions: . sodium chloride 20 mL/hr at 10/16/17 0600  . sodium chloride 10 mL (10/15/17  0928)  . albuterol 10 mg/hr (10/14/17 1106)  . ceFEPime (MAXIPIME) IV Stopped (10/16/17 0048)  . famotidine (PEPCID) IV Stopped (10/15/17 2356)  . feeding supplement (VITAL HIGH PROTEIN) 1,000 mL (10/15/17 1635)  . heparin 1,500 Units/hr (10/16/17 0018)     Objective: Vitals:   10/16/17 0507 10/16/17 0600 10/16/17 0700 10/16/17 0755  BP:  103/64 (!) 97/54 (!) 97/54  Pulse: 93 75 72 84  Resp: (!) 24 (!) 24 (!) 24 (!) 24  Temp:    98.4 F (36.9 C)  TempSrc:    Oral  SpO2: 99% 98% 99% 98%  Weight:      Height:        Intake/Output Summary (Last 24 hours) at 10/16/2017 0800 Last data filed at 10/16/2017 0600 Gross per 24 hour  Intake 3813.23 ml  Output 650 ml  Net 3163.23 ml   Filed Weights   10/14/17 1055 10/15/17 0500 10/16/17 0500  Weight: 185 lb (83.9 kg) 186 lb 15.2 oz (84.8 kg) 195 lb 8.8 oz (88.7 kg)    Examination:  General: Somnolent (just received medication), acute on chronic respiratory distress Neck:  Negative scars, masses, torticollis, lymphadenopathy, JVD, 6 mm cuffed trach in place negative sign of infection Lungs: Clear to auscultation bilaterally without wheezes or crackles Cardiovascular: Regular rate and rhythm without murmur gallop or rub normal S1 and S2 Abdomen: negative abdominal pain, nondistended, positive soft, bowel sounds, no rebound, no ascites, no appreciable mass, PEG tube in place covered and clean negative sign of infection Extremities: No significant cyanosis, clubbing, or edema bilateral lower extremities Skin: Negative rashes, lesions, ulcers Psychiatric: Unable to assess secondary to somnolence (just received medication)  Central nervous system: Spontaneously moves all extremities Unable to assess secondary to somnolence (just received medication) .     Data Reviewed: Care during the described time interval was provided by me .  I have reviewed this patient's available data, including medical history, events of note, physical  examination, and all test results as part of my evaluation.   CBC: Recent Labs  Lab 10/14/17 1040 10/15/17 0534 10/15/17 0832 10/15/17 1319 10/16/17 0512  WBC 34.3* 13.8* 12.2* 10.0 8.1  NEUTROABS 28.9*  --   --   --   --   HGB 11.2* 8.5* 8.3* 8.5* 8.5*  HCT 40.4 30.3* 29.3* 30.0* 30.1*  MCV 88.4 86.3 85.9 86.7 86.0  PLT 311 117* 114* 113* 562*   Basic Metabolic Panel: Recent Labs  Lab 10/14/17 1040 10/15/17 0534 10/15/17 1448 10/16/17 0512  NA 139 142  --  142  K 4.2 4.0  --  4.2  CL 97* 100*  --  103  CO2 32 34*  --  33*  GLUCOSE 191* 114*  --  161*  BUN 19 13  --  17  CREATININE 0.72 0.50  --  0.47  CALCIUM 9.5 8.9  --  9.3  MG  --  2.0 1.9 1.9  PHOS  --  3.6 3.1 3.0   GFR: Estimated Creatinine Clearance: 92.7 mL/min (by C-G formula based on SCr of 0.47 mg/dL). Liver Function Tests: Recent Labs  Lab 10/14/17 1040  AST  40  ALT 47  ALKPHOS 83  BILITOT 1.0  PROT 7.2  ALBUMIN 3.9   No results for input(s): LIPASE, AMYLASE in the last 168 hours. No results for input(s): AMMONIA in the last 168 hours. Coagulation Profile: No results for input(s): INR, PROTIME in the last 168 hours. Cardiac Enzymes: No results for input(s): CKTOTAL, CKMB, CKMBINDEX, TROPONINI in the last 168 hours. BNP (last 3 results) No results for input(s): PROBNP in the last 8760 hours. HbA1C: No results for input(s): HGBA1C in the last 72 hours. CBG: Recent Labs  Lab 10/15/17 1515 10/15/17 1949 10/15/17 2329 10/16/17 0323 10/16/17 0753  GLUCAP 111* 125* 95 113* 162*   Lipid Profile: No results for input(s): CHOL, HDL, LDLCALC, TRIG, CHOLHDL, LDLDIRECT in the last 72 hours. Thyroid Function Tests: No results for input(s): TSH, T4TOTAL, FREET4, T3FREE, THYROIDAB in the last 72 hours. Anemia Panel: No results for input(s): VITAMINB12, FOLATE, FERRITIN, TIBC, IRON, RETICCTPCT in the last 72 hours. Urine analysis:    Component Value Date/Time   COLORURINE YELLOW 10/14/2017 1030    APPEARANCEUR CLEAR 10/14/2017 1030   LABSPEC 1.034 (H) 10/14/2017 1030   PHURINE 7.0 10/14/2017 1030   GLUCOSEU NEGATIVE 10/14/2017 1030   HGBUR NEGATIVE 10/14/2017 1030   BILIRUBINUR NEGATIVE 10/14/2017 1030   KETONESUR NEGATIVE 10/14/2017 1030   PROTEINUR 30 (A) 10/14/2017 1030   NITRITE POSITIVE (A) 10/14/2017 1030   LEUKOCYTESUR TRACE (A) 10/14/2017 1030   Sepsis Labs: _0 (procalcitonin:4,lacticidven:4)  ) Recent Results (from the past 240 hour(s))  Blood Culture (routine x 2)     Status: Abnormal   Collection Time: 10/14/17 11:00 AM  Result Value Ref Range Status   Specimen Description BLOOD LEFT ANTECUBITAL  Final   Special Requests   Final    BOTTLES DRAWN AEROBIC AND ANAEROBIC Blood Culture adequate volume   Culture  Setup Time   Final    GRAM NEGATIVE RODS IN BOTH AEROBIC AND ANAEROBIC Charna Elizabeth PHARMD 2157 10/14/17 A BROWNING Performed at [facility], [place] [address] [place], [place] [address]    Culture ENTEROBACTER CLOACAE (A)  Final   Report Status 10/16/2017 FINAL  Final   Organism ID, Bacteria ENTEROBACTER CLOACAE  Final      Susceptibility   Enterobacter cloacae - MIC*    AMPICILLIN 16 INTERMEDIATE Intermediate     CEFAZOLIN >=64 RESISTANT Resistant     CEFEPIME <=1 SENSITIVE Sensitive     CEFTAZIDIME <=1 SENSITIVE Sensitive     CEFTRIAXONE <=1 SENSITIVE Sensitive     CIPROFLOXACIN <=0.25 SENSITIVE Sensitive     GENTAMICIN <=1 SENSITIVE Sensitive     IMIPENEM 0.5 SENSITIVE Sensitive     TRIMETH/SULFA <=20 SENSITIVE Sensitive     AMPICILLIN/SULBACTAM 4 SENSITIVE Sensitive     PIP/TAZO <=4 SENSITIVE Sensitive     Extended ESBL NEGATIVE Sensitive     * ENTEROBACTER CLOACAE  Blood Culture (routine x 2)     Status: None (Preliminary result)   Collection Time: 10/14/17 11:00 AM  Result Value Ref Range Status   Specimen Description BLOOD BLOOD RIGHT FOREARM  Final   Special Requests   Final    BOTTLES DRAWN AEROBIC AND ANAEROBIC Blood  Culture results may not be optimal due to an excessive volume of blood received in culture bottles   Culture  Setup Time   Final    IN BOTH AEROBIC AND ANAEROBIC BOTTLES GRAM NEGATIVE RODS CRITICAL VALUE NOTED.  VALUE IS CONSISTENT WITH PREVIOUSLY REPORTED AND CALLED VALUE. Performed  at [facility], [place] [address], [place], [place] [address]    Culture GRAM NEGATIVE RODS  Final   Report Status PENDING  Incomplete  Blood Culture ID Panel (Reflexed)     Status: Abnormal   Collection Time: 10/14/17 11:00 AM  Result Value Ref Range Status   Enterococcus species NOT DETECTED NOT DETECTED Final   Listeria monocytogenes NOT DETECTED NOT DETECTED Final   Staphylococcus species NOT DETECTED NOT DETECTED Final   Staphylococcus aureus NOT DETECTED NOT DETECTED Final   Streptococcus species NOT DETECTED NOT DETECTED Final   Streptococcus agalactiae NOT DETECTED NOT DETECTED Final   Streptococcus pneumoniae NOT DETECTED NOT DETECTED Final   Streptococcus pyogenes NOT DETECTED NOT DETECTED Final   Acinetobacter baumannii NOT DETECTED NOT DETECTED Final   Enterobacteriaceae species DETECTED (A) NOT DETECTED Final    Comment: Enterobacteriaceae represent a large family of gram-negative bacteria, not a single organism. CRITICAL RESULT CALLED TO, READ BACK BY AND VERIFIED WITH: Alvira Monday [facility] 2157 10/14/17 A BROWNING    Enterobacter cloacae complex DETECTED (A) NOT DETECTED Final    Comment: CRITICAL RESULT CALLED TO, READ BACK BY AND VERIFIED WITH: Alvira Monday PHARMD 2157 10/14/17 A BROWNING    Escherichia coli NOT DETECTED NOT DETECTED Final   Klebsiella oxytoca NOT DETECTED NOT DETECTED Final   Klebsiella pneumoniae NOT DETECTED NOT DETECTED Final   Proteus species NOT DETECTED NOT DETECTED Final   Serratia marcescens NOT DETECTED NOT DETECTED Final   Carbapenem resistance NOT DETECTED NOT DETECTED Final   Haemophilus influenzae NOT DETECTED NOT DETECTED Final   Neisseria meningitidis NOT  DETECTED NOT DETECTED Final   Pseudomonas aeruginosa NOT DETECTED NOT DETECTED Final   Candida albicans NOT DETECTED NOT DETECTED Final   Candida glabrata NOT DETECTED NOT DETECTED Final   Candida krusei NOT DETECTED NOT DETECTED Final   Candida parapsilosis NOT DETECTED NOT DETECTED Final   Candida tropicalis NOT DETECTED NOT DETECTED Final    Comment: Performed at [facility], [place] [address] [place], [place] [address]  MRSA PCR Screening     Status: None   Collection Time: 10/14/17  4:52 PM  Result Value Ref Range Status   MRSA by PCR NEGATIVE NEGATIVE Final    Comment:        The GeneXpert MRSA Assay (FDA approved for NASAL specimens only), is one component of a comprehensive MRSA colonization surveillance program. It is not intended to diagnose MRSA infection nor to guide or monitor treatment for MRSA infections. Performed at [facility], [place] [address], [place], [place] [address]          Radiology Studies: Ct Angio Chest Pe W And/or Wo Contrast  Result Date: 10/14/2017 CLINICAL DATA:  Shortness of breath and tachycardia EXAM: CT ANGIOGRAPHY CHEST WITH CONTRAST TECHNIQUE: Multidetector CT imaging of the chest was performed using the standard protocol during bolus administration of intravenous contrast. Multiplanar CT image reconstructions and MIPs were obtained to evaluate the vascular anatomy. CONTRAST:  135m ISOVUE-370 IOPAMIDOL (ISOVUE-370) INJECTION 76% COMPARISON:  Chest CT angiogram Sep 16, 2017 and chest radiograph October 14, 2017 FINDINGS: Cardiovascular: There is a small incompletely obstructing pulmonary embolus in the proximal left lower lobe pulmonary artery. No completely obstructing pulmonary embolus seen on this study. The right ventricle to left ventricle diameter ratio is less than 0.9, not consistent with right heart strain. There is no thoracic aortic aneurysm or dissection. The visualized great vessels appear unremarkable except for  calcification  in the proximal left subclavian artery. There are foci of calcification in the aorta. No pericardial effusion or pericardial thickening evident. Central catheter tip is in the superior vena cava. Mediastinum/Nodes: Thyroid appears unremarkable. There are subcentimeter mediastinal lymph nodes. There is no adenopathy by size criteria. No esophageal lesions are evident. Lungs/Pleura: There is extensive centrilobular and paraseptal emphysematous change. There is a large area of bullous disease throughout much of the left upper lobe and lingula. There is scarring in the right base. There is bilateral lower lobe as well as right middle lobe bronchiectatic change. No evident edema or consolidation. No pleural effusion or pleural thickening evident. Endotracheal tube tip is in the mid trachea.  No pneumothorax. Upper Abdomen: There is a small myelolipoma in the left adrenal measuring just over 1 x 1 cm. Visualized upper abdominal structures otherwise appear unremarkable. Musculoskeletal: No blastic or lytic bone lesions. No chest wall lesions are evident. Review of the MIP images confirms the above findings. IMPRESSION: 1. Small incompletely obstructing proximal left lower lobe pulmonary embolus. No right heart strain. No other pulmonary emboli evident. Note that the current incompletely obstructing pulmonary embolus on the left is best seen on axial slices 89 through 94 series 5, coronal slices 97 and 98 series 8, and sagittal slices 637 through 858 series 9. 2. No thoracic aortic aneurysm or dissection. There is aortic and great vessel atherosclerosis. 3. Extensive underlying emphysematous change with areas of scarring and bronchiectasis, stable. No frank edema or consolidation evident. 4.  No appreciable adenopathy. 5.  Small benign left adrenal myelolipoma. Aortic Atherosclerosis (ICD10-I70.0) and Emphysema (ICD10-J43.9). Critical Value/emergent results were called by telephone at the time of  interpretation on 10/14/2017 at 12:20 pm to [facility], who verbally acknowledged these results. Electronically Signed   By: Lowella Grip III M.D.   On: 10/14/2017 12:22   Dg Abdomen Peg Tube Location  Result Date: 10/14/2017 CLINICAL DATA:  Peg placement EXAM: ABDOMEN - 1 VIEW COMPARISON:  None. FINDINGS: Gastrostomy tube was injected with 30 mL Isovue-300. Contrast in the gastric fundus. No extravasation. Normal bowel gas pattern. IMPRESSION: Gastrostomy tube in the body the stomach. Electronically Signed   By: Franchot Gallo M.D.   On: 10/14/2017 15:34   Dg Chest Port 1 View  Result Date: 10/15/2017 CLINICAL DATA:  Respiratory failure, shortness of breath. EXAM: PORTABLE CHEST 1 VIEW COMPARISON:  CT scan chest of October 14, 2017 and portable chest x-ray of the same day. FINDINGS: The lungs remain hyperinflated and hyperlucent. Minimal blunting of the lateral costophrenic angles is present and stable. There are coarse lung markings in the right perihilar and infrahilar regions which are stable. The heart and pulmonary vascularity are normal. There is calcification in the wall of the aortic arch. The endotracheal tube tip projects approximately 9 cm above the carina and is located at the level of the clavicular heads. The left-sided PICC line tip projects over the midportion of the SVC. IMPRESSION: COPD. Chronic interstitial changes in the right perihilar region. No acute pneumonia nor pulmonary edema. Thoracic aortic atherosclerosis. The support tubes are in reasonable position. Electronically Signed   By: David  Martinique M.D.   On: 10/15/2017 07:47   Dg Chest Portable 1 View  Result Date: 10/14/2017 CLINICAL DATA:  Shortness of breath, ventilator dependent with tracheostomy, hypertension, diabetes mellitus EXAM: PORTABLE CHEST 1 VIEW COMPARISON:  Portable exam 1038 hours compared to 09/25/2017 FINDINGS: Tracheostomy tube projects over tracheal air column. LEFT arm PICC line tip projects over  SVC. Normal heart size, mediastinal contours, and pulmonary vascularity. Emphysematous and bronchitic changes consistent with COPD. Persistent interstitial prominence in the mid to lower RIGHT lung concerning for persistent infiltrate. Chronic blunting of the LEFT lateral costophrenic angle. No definite pleural effusion or pneumothorax. Bones demineralized. Atherosclerotic calcification noted at aortic arch. IMPRESSION: COPD changes with chronic blunting of LEFT costophrenic angle and suspected persistent mild interstitial infiltrate in the mid to lower RIGHT lung. When compared to the previous exam no significant change. Electronically Signed   By: Lavonia Dana M.D.   On: 10/14/2017 10:57        Scheduled Meds: . [START ON 10/17/2017] alpha-1-proteinase inhibitor (human)  60 mg/kg Intravenous Weekly  . budesonide (PULMICORT) nebulizer solution  0.5 mg Nebulization BID  . chlorhexidine gluconate (MEDLINE KIT)  15 mL Mouth Rinse BID  . clonazePAM  1 mg Per Tube Q8H  . free water  200 mL Per Tube Q4H  . insulin aspart  0-15 Units Subcutaneous Q4H  . mouth rinse  15 mL Mouth Rinse 10 times per day  . methylPREDNISolone (SOLU-MEDROL) injection  40 mg Intravenous Q12H  . midodrine  5 mg Per Tube TID WC  . PARoxetine  30 mg Per Tube Daily  . QUEtiapine  100 mg Per Tube BID  . rOPINIRole  0.5 mg Per Tube QHS   Continuous Infusions: . sodium chloride 20 mL/hr at 10/16/17 0600  . sodium chloride 10 mL (10/15/17 0928)  . albuterol 10 mg/hr (10/14/17 1106)  . ceFEPime (MAXIPIME) IV Stopped (10/16/17 0048)  . famotidine (PEPCID) IV Stopped (10/15/17 2356)  . feeding supplement (VITAL HIGH PROTEIN) 1,000 mL (10/15/17 1635)  . heparin 1,500 Units/hr (10/16/17 0018)     LOS: 2 days    Time spent: 40 minutes    , Geraldo Docker, MD [facility] Pager [phone]   If 7PM-7AM, please contact night-coverage [email or web address] Password [email or web address] 10/16/2017, 8:00 AM

## 2017-10-17 DIAGNOSIS — Z9911 Dependence on respirator [ventilator] status: Secondary | ICD-10-CM

## 2017-10-17 DIAGNOSIS — Z431 Encounter for attention to gastrostomy: Secondary | ICD-10-CM

## 2017-10-17 DIAGNOSIS — I2609 Other pulmonary embolism with acute cor pulmonale: Secondary | ICD-10-CM

## 2017-10-17 DIAGNOSIS — R0603 Acute respiratory distress: Secondary | ICD-10-CM

## 2017-10-17 LAB — GLUCOSE, CAPILLARY
GLUCOSE-CAPILLARY: 137 mg/dL — AB (ref 65–99)
GLUCOSE-CAPILLARY: 150 mg/dL — AB (ref 65–99)
GLUCOSE-CAPILLARY: 159 mg/dL — AB (ref 65–99)
GLUCOSE-CAPILLARY: 187 mg/dL — AB (ref 65–99)
Glucose-Capillary: 167 mg/dL — ABNORMAL HIGH (ref 65–99)
Glucose-Capillary: 106 mg/dL — ABNORMAL HIGH (ref 65–99)

## 2017-10-17 LAB — CBC
HCT: 29.9 % — ABNORMAL LOW (ref 36.0–46.0)
HEMOGLOBIN: 8.4 g/dL — AB (ref 12.0–15.0)
MCH: 24.1 pg — ABNORMAL LOW (ref 26.0–34.0)
MCHC: 28.1 g/dL — ABNORMAL LOW (ref 30.0–36.0)
MCV: 85.9 fL (ref 78.0–100.0)
PLATELETS: 121 10*3/uL — AB (ref 150–400)
RBC: 3.48 MIL/uL — AB (ref 3.87–5.11)
RDW: 16.7 % — ABNORMAL HIGH (ref 11.5–15.5)
WBC: 5.8 10*3/uL (ref 4.0–10.5)

## 2017-10-17 LAB — CULTURE, BLOOD (ROUTINE X 2)

## 2017-10-17 LAB — HEPARIN LEVEL (UNFRACTIONATED): HEPARIN UNFRACTIONATED: 0.4 [IU]/mL (ref 0.30–0.70)

## 2017-10-17 MED ORDER — SODIUM CHLORIDE 0.9 % IV SOLN
2.0000 g | INTRAVENOUS | Status: DC
  Administered 2017-10-17 – 2017-10-20 (×4): 2 g via INTRAVENOUS
  Filled 2017-10-17 (×4): qty 20

## 2017-10-17 MED ORDER — PANTOPRAZOLE SODIUM 40 MG PO PACK
40.0000 mg | PACK | Freq: Every day | ORAL | Status: DC
  Administered 2017-10-17 – 2017-10-20 (×4): 40 mg
  Filled 2017-10-17 (×4): qty 20

## 2017-10-17 MED ORDER — ALPHA1-PROTEINASE INHIBITOR 1000 MG/20ML IV SOLN
4964.0000 mg | INTRAVENOUS | Status: DC
  Administered 2017-10-17: 4964 mg via INTRAVENOUS
  Filled 2017-10-17 (×2): qty 5000

## 2017-10-17 NOTE — Progress Notes (Signed)
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 On 6/18 she developed respiratory distress with RR in 40s and HR in 140s and was brought to [facility] for further management.  CTa revealed a small PE.  In the ED she was observed to "vomit" a large amount of mucous, after which her resp status markedly improved.    Significant Events: 6/18 admit 6/19 B LE venous duplex - negative for DVT   Subjective: Looks very comfortable this morning.  Denies cp, sob, n/v, or abdom pain.    Assessment & Plan:  Acute on chronic hypoxic and hypercapnic respiratory failure - Enterobacter PNA / Aspiration  Appears to have stabilized nicely at this time   Severe COPD / Alpha-1 Antitrypsin deficiency / advanced lung disease / chronic tracheostomy  Per Epic note not transplant candidate per [facility] after inquiry on last admit - appears stable at this time   Acute L PE  Full anticoag for 6 months per PCCM, then lifelong pharmacologic prophylaxis   Chronic vent dependent No plan to attempt weaning per PCCM - will need vent SNF bed   Chronic Hypotension Cont midodrine - BP well controlled   Chronic multifactorial normocytic anemia  Thrombocytopenia Previous HIT eval negative - plt count stable    DM2 controlled without complication 1/06 Y6R 5.1 - CBG reasonably controlled   Severe Anxiety On multiple chronic meds which are being continued - appears well controlled at this time   Diarrhea C diff negative  DVT prophylaxis: heparin gtt Code Status: FULL CODE Family Communication: no family present at time of exam  Disposition Plan: appears  stable to return to vent SNF or LTACH as able   Consultants:  PCCM  Antimicrobials:   Unasyn 6/18  Cefepime 6/18 >  Objective: Blood pressure 135/80, pulse 96, temperature 98.6 F (37 C), temperature source Oral, resp. rate (!) 24, height '5\' 4"'$  (1.626 m), weight 88.2 kg (194 lb 7.1 oz), SpO2 99 %.  Intake/Output Summary (Last 24 hours) at 10/17/2017 0849 Last data filed at 10/17/2017 0800 Gross per 24 hour  Intake 4554.65 ml  Output 1225 ml  Net 3329.65 ml   Filed Weights   10/15/17 0500 10/16/17 0500 10/17/17 0500  Weight: 84.8 kg (186 lb 15.2 oz) 88.7 kg (195 lb 8.8 oz) 88.2 kg (194 lb 7.1 oz)    Examination: General: No acute respiratory distress - alert  Lungs: Clear to auscultation bilaterally without wheezes or crackles Cardiovascular: Regular rate and rhythm without murmur gallop or rub normal S1 and S2 Abdomen: Nontender, nondistended, soft, bowel sounds positive, no rebound, no ascites, no appreciable mass Extremities: trace B LE edema - no cyanosis   CBC: Recent Labs  Lab 10/14/17 1040  10/15/17 1319 10/16/17 0512 10/17/17 0515  WBC 34.3*   < > 10.0 8.1 5.8  NEUTROABS 28.9*  --   --   --   --   HGB 11.2*   < > 8.5* 8.5* 8.4*  HCT 40.4   < > 30.0* 30.1* 29.9*  MCV 88.4   < > 86.7 86.0 85.9  PLT 311   < > 113* 135* 121*   < > =  values in this interval not displayed.   Basic Metabolic Panel: Recent Labs  Lab 10/14/17 1040 10/15/17 0534 10/15/17 1448 10/16/17 0512  NA 139 142  --  142  K 4.2 4.0  --  4.2  CL 97* 100*  --  103  CO2 32 34*  --  33*  GLUCOSE 191* 114*  --  161*  BUN 19 13  --  17  CREATININE 0.72 0.50  --  0.47  CALCIUM 9.5 8.9  --  9.3  MG  --  2.0 1.9 1.9  PHOS  --  3.6 3.1 3.0   GFR: Estimated Creatinine Clearance: 92.5 mL/min (by C-G formula based on SCr of 0.47 mg/dL).  Liver Function Tests: Recent Labs  Lab 10/14/17 1040  AST 40  ALT 47  ALKPHOS 83  BILITOT 1.0  PROT 7.2  ALBUMIN 3.9   HbA1C: Hgb A1c MFr Bld    Date/Time Value Ref Range Status  09/12/2017 05:17 AM 5.1 4.8 - 5.6 % Final    Comment:    (NOTE) Pre diabetes:          5.7%-6.4% Diabetes:              >6.4% Glycemic control for   <7.0% adults with diabetes     CBG: Recent Labs  Lab 10/16/17 1549 10/16/17 1935 10/16/17 2322 10/17/17 0356 10/17/17 0741  GLUCAP 148* 179* 133* 167* 159*    Recent Results (from the past 240 hour(s))  Blood Culture (routine x 2)     Status: Abnormal   Collection Time: 10/14/17 11:00 AM  Result Value Ref Range Status   Specimen Description BLOOD LEFT ANTECUBITAL  Final   Special Requests   Final    BOTTLES DRAWN AEROBIC AND ANAEROBIC Blood Culture adequate volume   Culture  Setup Time   Final    GRAM NEGATIVE RODS IN BOTH AEROBIC AND ANAEROBIC Charna Elizabeth PHARMD 2157 10/14/17 A BROWNING Performed at [facility], [place] [address], [place],  [address]    Culture ENTEROBACTER CLOACAE (A)  Final   Report Status 10/16/2017 FINAL  Final   Organism ID, Bacteria ENTEROBACTER CLOACAE  Final      Susceptibility   Enterobacter cloacae - MIC*    AMPICILLIN 16 INTERMEDIATE Intermediate     CEFAZOLIN >=64 RESISTANT Resistant     CEFEPIME <=1 SENSITIVE Sensitive     CEFTAZIDIME <=1 SENSITIVE Sensitive     CEFTRIAXONE <=1 SENSITIVE Sensitive     CIPROFLOXACIN <=0.25 SENSITIVE Sensitive     GENTAMICIN <=1 SENSITIVE Sensitive     IMIPENEM 0.5 SENSITIVE Sensitive     TRIMETH/SULFA <=20 SENSITIVE Sensitive     AMPICILLIN/SULBACTAM 4 SENSITIVE Sensitive     PIP/TAZO <=4 SENSITIVE Sensitive     Extended ESBL NEGATIVE Sensitive     * ENTEROBACTER CLOACAE  Blood Culture (routine x 2)     Status: Abnormal   Collection Time: 10/14/17 11:00 AM  Result Value Ref Range Status   Specimen Description BLOOD BLOOD RIGHT FOREARM  Final   Special Requests   Final    BOTTLES DRAWN AEROBIC AND ANAEROBIC Blood Culture results may not be optimal due to an excessive volume of blood received in  culture bottles   Culture  Setup Time   Final    IN BOTH AEROBIC AND ANAEROBIC BOTTLES GRAM NEGATIVE RODS CRITICAL VALUE NOTED.  VALUE IS CONSISTENT WITH PREVIOUSLY REPORTED AND CALLED VALUE.    Culture (A)  Final  ENTEROBACTER CLOACAE SUSCEPTIBILITIES PERFORMED ON PREVIOUS CULTURE WITHIN THE LAST 5 DAYS. Performed at [facility], [place] [address], [place], [place] [address]    Report Status 10/17/2017 FINAL  Final  Blood Culture ID Panel (Reflexed)     Status: Abnormal   Collection Time: 10/14/17 11:00 AM  Result Value Ref Range Status   Enterococcus species NOT DETECTED NOT DETECTED Final   Listeria monocytogenes NOT DETECTED NOT DETECTED Final   Staphylococcus species NOT DETECTED NOT DETECTED Final   Staphylococcus aureus NOT DETECTED NOT DETECTED Final   Streptococcus species NOT DETECTED NOT DETECTED Final   Streptococcus agalactiae NOT DETECTED NOT DETECTED Final   Streptococcus pneumoniae NOT DETECTED NOT DETECTED Final   Streptococcus pyogenes NOT DETECTED NOT DETECTED Final   Acinetobacter baumannii NOT DETECTED NOT DETECTED Final   Enterobacteriaceae species DETECTED (A) NOT DETECTED Final    Comment: Enterobacteriaceae represent a large family of gram-negative bacteria, not a single organism. CRITICAL RESULT CALLED TO, READ BACK BY AND VERIFIED WITH: Alvira Monday [facility] 2157 10/14/17 A BROWNING    Enterobacter cloacae complex DETECTED (A) NOT DETECTED Final    Comment: CRITICAL RESULT CALLED TO, READ BACK BY AND VERIFIED WITH: Alvira Monday PHARMD 2157 10/14/17 A BROWNING    Escherichia coli NOT DETECTED NOT DETECTED Final   Klebsiella oxytoca NOT DETECTED NOT DETECTED Final   Klebsiella pneumoniae NOT DETECTED NOT DETECTED Final   Proteus species NOT DETECTED NOT DETECTED Final   Serratia marcescens NOT DETECTED NOT DETECTED Final   Carbapenem resistance NOT DETECTED NOT DETECTED Final   Haemophilus influenzae NOT DETECTED NOT DETECTED Final   Neisseria meningitidis  NOT DETECTED NOT DETECTED Final   Pseudomonas aeruginosa NOT DETECTED NOT DETECTED Final   Candida albicans NOT DETECTED NOT DETECTED Final   Candida glabrata NOT DETECTED NOT DETECTED Final   Candida krusei NOT DETECTED NOT DETECTED Final   Candida parapsilosis NOT DETECTED NOT DETECTED Final   Candida tropicalis NOT DETECTED NOT DETECTED Final    Comment: Performed at [facility], [place] [address] [place], [place] [address]  MRSA PCR Screening     Status: None   Collection Time: 10/14/17  4:52 PM  Result Value Ref Range Status   MRSA by PCR NEGATIVE NEGATIVE Final    Comment:        The GeneXpert MRSA Assay (FDA approved for NASAL specimens only), is one component of a comprehensive MRSA colonization surveillance program. It is not intended to diagnose MRSA infection nor to guide or monitor treatment for MRSA infections. Performed at [facility], [place] [address], [place], [place] [address]   C difficile quick scan w PCR reflex     Status: None   Collection Time: 10/16/17  9:09 AM  Result Value Ref Range Status   C Diff antigen NEGATIVE NEGATIVE Final   C Diff toxin NEGATIVE NEGATIVE Final   C Diff interpretation No C. difficile detected.  Final    Comment: Performed at [facility], [place] [address], [place], [place] [address]     Scheduled Meds: . alpha-1-proteinase inhibitor (human)  4,964 mg Intravenous Weekly  . budesonide (PULMICORT) nebulizer solution  0.5 mg Nebulization BID  . chlorhexidine gluconate (MEDLINE KIT)  15 mL Mouth Rinse BID  . clonazePAM  1 mg Per Tube Q8H  . free water  200 mL Per Tube Q4H  . insulin aspart  0-15 Units Subcutaneous Q4H  . mouth rinse  15 mL Mouth Rinse  10 times per day  . methylPREDNISolone (SOLU-MEDROL) injection  40 mg Intravenous Q12H  . midodrine  5 mg Per Tube TID WC  . PARoxetine  30 mg Per Tube Daily  . QUEtiapine  100 mg Per Tube BID  . rOPINIRole  0.5 mg Per Tube QHS   Continuous Infusions: . sodium  chloride 75 mL/hr at 10/17/17 0800  . sodium chloride Stopped (10/17/17 0516)  . albuterol 10 mg/hr (10/14/17 1106)  . ceFEPime (MAXIPIME) IV Stopped (10/16/17 2258)  . famotidine (PEPCID) IV Stopped (10/16/17 2216)  . feeding supplement (VITAL HIGH PROTEIN) 50 mL/hr at 10/17/17 0700  . heparin 1,500 Units/hr (10/17/17 0700)     LOS: 3 days   Cherene Altes, MD [facility] Office  [phone] Pager - Text Page per Amion as per below:  On-Call/Text Page:      [email or web address]      password [email or web address]  If 7PM-7AM, please contact night-coverage [email or web address] Password [facility] 10/17/2017, 8:49 AM

## 2017-10-17 NOTE — Clinical Social Work Note (Signed)
Clinical Social Work Assessment  Patient Details  Name: Danielle Stevens MRN: [id number] Date of Birth: 07/04/1970  Date of referral:  10/17/17               Reason for consult:  Facility Placement(pt from [facility]. )                Permission sought to share information with:  Family Supports Permission granted to share information::  Yes, Verbal Permission Granted  Name::     Camera operator::  family  Relationship::   daughter  Solicitor Information:  Architect [phone]  Housing/Transportation Living arrangements for the past 2 months:  Skilled Nursing Facility([facility]) Source of Information:  Patient Patient Interpreter Needed:  None Criminal Activity/Legal Involvement Pertinent to Current Situation/Hospitalization:  No - Comment as needed Significant Relationships:  Adult Children, Siblings Lives with:  Facility Resident Do you feel safe going back to the place where you live?  Yes Need for family participation in patient care:  Yes (Comment)  Care giving concerns:  CSW spoke with pt at bedside. At this time pt denies having any concerns to CSW.   Social Worker assessment / plan:  CSW spoke with pt at bedside. During this time CSW was informed that pt is from [facility] and has been there since Febuary 2019. Pt expressed having supports from daughter and brother at this time. Pt expressed being pleased with the assistance that pt gets from [facility] at this time.  During this time pt was sitting up in bed. Pt was pleasant and smiled every so often. Pt expressed that pt would like to know when pt would be able to go home. CSW verbalized that pt has to get better and then pt could speak with other staff for further details on that .   Employment status:  Unemployed Health and safety inspector:  Medicare PT Recommendations:  Not assessed at this time Information / Referral to community resources:  Skilled Nursing Facility  Patient/Family's Response to care:  Pt's  response to care was understanding ans positive at this time.   Patient/Family's Understanding of and Emotional Response to Diagnosis, Current Treatment, and Prognosis:  NO further questions or concerns have been presented to CSW at this time. Emotional response included smiling and thanking CSW for speaking with pt.  Emotional Assessment Appearance:  Appears stated age Attitude/Demeanor/Rapport:  Engaged Affect (typically observed):  Appropriate, Accepting, Pleasant Orientation:  Oriented to Self, Oriented to Place, Oriented to  Time, Oriented to Situation Alcohol / Substance use:  Not Applicable Psych involvement (Current and /or in the community):  No (Comment)  Discharge Needs  Concerns to be addressed:  No discharge needs identified Readmission within the last 30 days:  No Current discharge risk:  None Barriers to Discharge:  Continued Medical Work up   [employer], LCSWA 10/17/2017, 10:40 AM

## 2017-10-17 NOTE — Progress Notes (Signed)
ANTICOAGULATION CONSULT NOTE - Follow Up Consult  Pharmacy Consult for heparin Indication: pulmonary embolus  Allergies  Allergen Reactions  . Wasp Venom Protein Shortness Of Breath  . Adhesive [Tape] Other (See Comments)    Bruises   . Coconut Oil Hives  . Latex Other (See Comments)    Bruises   . Robitussin Severe Multi-Symp [Phenylephrine-Dm-Gg-Apap] Diarrhea and Other (See Comments)    Allergic, per verbal MAR  . Shellfish-Derived Products Hives and Other (See Comments)    Allergic, per verbal MAR    Patient Measurements: Height: 5\' 4"  (162.6 cm) Weight: 194 lb 7.1 oz (88.2 kg) IBW/kg (Calculated) : 54.7 Heparin Dosing Weight: 73kg  Vital Signs: Temp: 98.3 F (36.8 C) (06/21 1125) Temp Source: Oral (06/21 1125) BP: 107/77 (06/21 1200) Pulse Rate: 69 (06/21 1200)  Labs: Recent Labs    10/15/17 0534  10/15/17 1319 10/15/17 1808 10/16/17 0512 10/17/17 0515  HGB 8.5*   < > 8.5*  --  8.5* 8.4*  HCT 30.3*   < > 30.0*  --  30.1* 29.9*  PLT 117*   < > 113*  --  135* 121*  HEPARINUNFRC 0.20*  --   --  0.49 0.44 0.40  CREATININE 0.50  --   --   --  0.47  --    < > = values in this interval not displayed.    Estimated Creatinine Clearance: 92.5 mL/min (by C-G formula based on SCr of 0.47 mg/dL).   Medical History: Past Medical History:  Diagnosis Date  . Alpha-1-antitrypsin deficiency (HCC)   . Anemia   . Anxiety   . Asthma   . COPD (chronic obstructive pulmonary disease) (HCC)   . Diabetes mellitus without complication (HCC)   . Emphysema lung (HCC)   . Hypertension     Assessment: 57 yoF admitted with respiratory distress found to have acute LLL PE without R heart strain.   Heparin level this AM remains therapeutic at 0.4 on 1500 units/hr. No s/s of bleeding this AM. H/H low stable. Plt 121k   Goal of Therapy:  Heparin level 0.3-0.7 units/ml Monitor platelets by anticoagulation protocol: Yes   Plan:  -Continue heparin at 1500 units/hr -Monitor  heparin level, CBC, S/Sx bleeding daily  Vinnie Level, PharmD., BCPS Clinical Pharmacist Clinical phone for 10/17/17 until 3:30pm: [phone] If after 3:30pm, please call main pharmacy at: [phone]

## 2017-10-18 LAB — COMPREHENSIVE METABOLIC PANEL
ALK PHOS: 44 U/L (ref 38–126)
ALT: 28 U/L (ref 14–54)
ANION GAP: 7 (ref 5–15)
AST: 23 U/L (ref 15–41)
Albumin: 3.3 g/dL — ABNORMAL LOW (ref 3.5–5.0)
BUN: 17 mg/dL (ref 6–20)
CALCIUM: 9.4 mg/dL (ref 8.9–10.3)
CO2: 32 mmol/L (ref 22–32)
CREATININE: 0.53 mg/dL (ref 0.44–1.00)
Chloride: 104 mmol/L (ref 101–111)
GFR calc Af Amer: >60 mL/min (ref >60)
GFR calc non Af Amer: >60 mL/min (ref >60)
Glucose, Bld: 125 mg/dL — ABNORMAL HIGH (ref 65–99)
Potassium: 3.7 mmol/L (ref 3.5–5.1)
SODIUM: 143 mmol/L (ref 135–145)
Total Bilirubin: 0.4 mg/dL (ref 0.3–1.2)
Total Protein: 5.9 g/dL — ABNORMAL LOW (ref 6.5–8.1)

## 2017-10-18 LAB — GLUCOSE, CAPILLARY
GLUCOSE-CAPILLARY: 117 mg/dL — AB (ref 65–99)
Glucose-Capillary: 122 mg/dL — ABNORMAL HIGH (ref 65–99)
Glucose-Capillary: 128 mg/dL — ABNORMAL HIGH (ref 65–99)
Glucose-Capillary: 154 mg/dL — ABNORMAL HIGH (ref 65–99)
Glucose-Capillary: 121 mg/dL — ABNORMAL HIGH (ref 65–99)

## 2017-10-18 LAB — CBC
HEMATOCRIT: 33 % — AB (ref 36.0–46.0)
HEMOGLOBIN: 9.3 g/dL — AB (ref 12.0–15.0)
MCH: 24.3 pg — ABNORMAL LOW (ref 26.0–34.0)
MCHC: 28.2 g/dL — ABNORMAL LOW (ref 30.0–36.0)
MCV: 86.4 fL (ref 78.0–100.0)
Platelets: 127 10*3/uL — ABNORMAL LOW (ref 150–400)
RBC: 3.82 MIL/uL — ABNORMAL LOW (ref 3.87–5.11)
RDW: 16.6 % — ABNORMAL HIGH (ref 11.5–15.5)
WBC: 6.1 10*3/uL (ref 4.0–10.5)

## 2017-10-18 LAB — MAGNESIUM: Magnesium: 1.8 mg/dL (ref 1.7–2.4)

## 2017-10-18 LAB — HEPARIN LEVEL (UNFRACTIONATED): Heparin Unfractionated: 0.52 IU/mL (ref 0.30–0.70)

## 2017-10-18 MED ORDER — PREDNISONE 20 MG PO TABS
40.0000 mg | ORAL_TABLET | Freq: Every day | ORAL | Status: DC
  Administered 2017-10-19 – 2017-10-20 (×2): 40 mg
  Filled 2017-10-18 (×2): qty 2

## 2017-10-18 MED ORDER — APIXABAN 5 MG PO TABS: 5.0000 mg | ORAL_TABLET | Freq: Two times a day (BID) | ORAL | Status: DC

## 2017-10-18 MED ORDER — QUETIAPINE FUMARATE 100 MG PO TABS: 100.0000 mg | ORAL_TABLET | Freq: Two times a day (BID) | ORAL | Status: DC

## 2017-10-18 MED ORDER — APIXABAN 5 MG PO TABS
10.0000 mg | ORAL_TABLET | Freq: Two times a day (BID) | ORAL | Status: DC
  Administered 2017-10-18 – 2017-10-20 (×5): 10 mg
  Filled 2017-10-18 (×5): qty 2

## 2017-10-18 NOTE — Progress Notes (Signed)
ANTICOAGULATION CONSULT NOTE - Initial Consult  Pharmacy Consult for apixaban Indication: pulmonary embolus  Allergies  Allergen Reactions  . Wasp Venom Protein Shortness Of Breath  . Adhesive [Tape] Other (See Comments)    Bruises   . Coconut Oil Hives  . Latex Other (See Comments)    Bruises   . Robitussin Severe Multi-Symp [Phenylephrine-Dm-Gg-Apap] Diarrhea and Other (See Comments)    Allergic, per verbal MAR  . Shellfish-Derived Products Hives and Other (See Comments)    Allergic, per verbal MAR    Patient Measurements: Height: 5\' 4"  (162.6 cm) Weight: 205 lb 11 oz (93.3 kg) IBW/kg (Calculated) : 54.7   Vital Signs: Temp: 98.9 F (37.2 C) (06/22 0757) Temp Source: Oral (06/22 0757) BP: 139/86 (06/22 0900) Pulse Rate: 72 (06/22 0900)  Labs: Recent Labs    10/16/17 0512 10/17/17 0515 10/18/17 0550  HGB 8.5* 8.4* 9.3*  HCT 30.1* 29.9* 33.0*  PLT 135* 121* 127*  HEPARINUNFRC 0.44 0.40 0.52  CREATININE 0.47  --  0.53    Estimated Creatinine Clearance: 95.2 mL/min (by C-G formula based on SCr of 0.53 mg/dL).    Assessment: 47 year old female admitted with respiratory distress and found to have acute LLL PE without R heart strain. Patient has been on therapeutic heparin. Pharmacy has been consulted to transition patient to apixaban.   CBC and platelets this AM are stable. SCr-0.53.  Patient < 52 years of age and > 60 kg.  No signs of bleeding have been noted.    Plan:  Apixaban 10 mg BID X 7 days  Then Apixaban 5 mg BID  Stop heparin with first dose of apixaban Monitor CBC, renal function and signs/symptoms of bleeding.  Pharmacy will sign off and monitor peripherally  Sharin Mons, PharmD, BCPS PGY2 Infectious Diseases Pharmacy Resident Phone: [phone] 10/18/2017,9:19 AM

## 2017-10-18 NOTE — Progress Notes (Signed)
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 On 6/18 she developed respiratory distress with RR in 40s and HR in 140s and was brought to [facility] for further management.  CTa revealed a small PE.  In the ED she was observed to "vomit" a large amount of mucous, after which her resp status markedly improved.    Significant Events: 6/18 admit 6/19 B LE venous duplex - negative for DVT   Subjective: Resting comfortably in bed.  No evidence of acute resp distress or uncontrolled pain.  Is alert and interactive.    Assessment & Plan:  Enterobacter PNA / Bacteremia / Aspiration 2 of 2 blood cx + - cont abx tx for 10 days to assure clearance - remove PICC and observe central line holiday (has working peripheral IV but needs PICC for weekly A1AT meds)  Acute on chronic hypoxic and hypercapnic respiratory failure  She has stabilized - doing well on vent - lives in a vent SNF   Severe COPD / Alpha-1 Antitrypsin deficiency / advanced lung disease / chronic tracheostomy  Per Epic note not transplant candidate per [facility] after inquiry on last admit - appears stable  Acute L PE  Full anticoag for 6 months per PCCM, then lifelong pharmacologic prophylaxis - transition to per tube eliquis today   Chronic vent dependent No plan to attempt weaning per PCCM - will need vent SNF bed   Chronic Hypotension Cont midodrine - BP actually climbing - follow and consider stopping midodrine   Chronic multifactorial normocytic anemia Hgb is stable/climbing   Thrombocytopenia Previous HIT eval negative - plt count stable    DM2 controlled without  complication 9/14 N8G 5.1 - CBG reasonably controlled   Severe Anxiety On multiple chronic meds which are being continued - appears well controlled today   Diarrhea C diff negative  DVT prophylaxis: heparin gtt Code Status: FULL CODE Family Communication: no family present at time of exam  Disposition Plan: appears stable to return to vent SNF or LTACH when bed available (if they are able to replace PICC)  Consultants:  PCCM  Antimicrobials:   Unasyn 6/18  Cefepime 6/18 > 6/20 Rocephin 6/21 >  Objective: Blood pressure (!) 153/98, pulse 85, temperature 98.9 F (37.2 C), temperature source Oral, resp. rate (!) 23, height '5\' 4"'$  (1.626 m), weight 93.3 kg (205 lb 11 oz), SpO2 99 %.  Intake/Output Summary (Last 24 hours) at 10/18/2017 0848 Last data filed at 10/18/2017 0800 Gross per 24 hour  Intake 1794.01 ml  Output 1570 ml  Net 224.01 ml   Filed Weights   10/16/17 0500 10/17/17 0500 10/18/17 0500  Weight: 88.7 kg (195 lb 8.8 oz) 88.2 kg (194 lb 7.1 oz) 93.3 kg (205 lb 11 oz)    Examination: General: No acute respiratory distress - pleasant - comfortable Lungs: Clear to auscultation B w/o wheezing  Cardiovascular: RRR - no M or rub Abdomen: Nontender, nondistended, soft, bowel sounds positive, no mass  Extremities: trace B LE edema w/o change   CBC: Recent Labs  Lab 10/14/17 1040  10/16/17 0512 10/17/17 0515 10/18/17 0550  WBC 34.3*   < > 8.1 5.8  6.1  NEUTROABS 28.9*  --   --   --   --   HGB 11.2*   < > 8.5* 8.4* 9.3*  HCT 40.4   < > 30.1* 29.9* 33.0*  MCV 88.4   < > 86.0 85.9 86.4  PLT 311   < > 135* 121* 127*   < > = values in this interval not displayed.   Basic Metabolic Panel: Recent Labs  Lab 10/15/17 0534 10/15/17 1448 10/16/17 0512 10/18/17 0550  NA 142  --  142 143  K 4.0  --  4.2 3.7  CL 100*  --  103 104  CO2 34*  --  33* 32  GLUCOSE 114*  --  161* 125*  BUN 13  --  17 17  CREATININE 0.50  --  0.47 0.53  CALCIUM 8.9  --  9.3 9.4  MG  2.0 1.9 1.9 1.8  PHOS 3.6 3.1 3.0  --    GFR: Estimated Creatinine Clearance: 95.2 mL/min (by C-G formula based on SCr of 0.53 mg/dL).  Liver Function Tests: Recent Labs  Lab 10/14/17 1040 10/18/17 0550  AST 40 23  ALT 47 28  ALKPHOS 83 44  BILITOT 1.0 0.4  PROT 7.2 5.9*  ALBUMIN 3.9 3.3*   HbA1C: Hgb A1c MFr Bld  Date/Time Value Ref Range Status  09/12/2017 05:17 AM 5.1 4.8 - 5.6 % Final    Comment:    (NOTE) Pre diabetes:          5.7%-6.4% Diabetes:              >6.4% Glycemic control for   <7.0% adults with diabetes     CBG: Recent Labs  Lab 10/17/17 1513 10/17/17 1943 10/17/17 2343 10/18/17 0340 10/18/17 0759  GLUCAP 106* 187* 137* 122* 128*    Recent Results (from the past 240 hour(s))  Blood Culture (routine x 2)     Status: Abnormal   Collection Time: 10/14/17 11:00 AM  Result Value Ref Range Status   Specimen Description BLOOD LEFT ANTECUBITAL  Final   Special Requests   Final    BOTTLES DRAWN AEROBIC AND ANAEROBIC Blood Culture adequate volume   Culture  Setup Time   Final    GRAM NEGATIVE RODS IN BOTH AEROBIC AND ANAEROBIC Charna Elizabeth PHARMD 2157 10/14/17 A BROWNING Performed at [facility], [place] [address] [place], [place] [address]    Culture ENTEROBACTER CLOACAE (A)  Final   Report Status 10/16/2017 FINAL  Final   Organism ID, Bacteria ENTEROBACTER CLOACAE  Final      Susceptibility   Enterobacter cloacae - MIC*    AMPICILLIN 16 INTERMEDIATE Intermediate     CEFAZOLIN >=64 RESISTANT Resistant     CEFEPIME <=1 SENSITIVE Sensitive     CEFTAZIDIME <=1 SENSITIVE Sensitive     CEFTRIAXONE <=1 SENSITIVE Sensitive     CIPROFLOXACIN <=0.25 SENSITIVE Sensitive     GENTAMICIN <=1 SENSITIVE Sensitive     IMIPENEM 0.5 SENSITIVE Sensitive     TRIMETH/SULFA <=20 SENSITIVE Sensitive     AMPICILLIN/SULBACTAM 4 SENSITIVE Sensitive     PIP/TAZO <=4 SENSITIVE Sensitive     Extended ESBL NEGATIVE Sensitive     * ENTEROBACTER CLOACAE  Blood  Culture (routine x 2)     Status: Abnormal   Collection Time: 10/14/17 11:00 AM  Result Value Ref Range Status   Specimen Description BLOOD BLOOD RIGHT FOREARM  Final   Special Requests   Final    BOTTLES  DRAWN AEROBIC AND ANAEROBIC Blood Culture results may not be optimal due to an excessive volume of blood received in culture bottles   Culture  Setup Time   Final    IN BOTH AEROBIC AND ANAEROBIC BOTTLES GRAM NEGATIVE RODS CRITICAL VALUE NOTED.  VALUE IS CONSISTENT WITH PREVIOUSLY REPORTED AND CALLED VALUE.    Culture (A)  Final    ENTEROBACTER CLOACAE SUSCEPTIBILITIES PERFORMED ON PREVIOUS CULTURE WITHIN THE LAST 5 DAYS. Performed at [facility], [place] [address], [place], [place] [address]    Report Status 10/17/2017 FINAL  Final  Blood Culture ID Panel (Reflexed)     Status: Abnormal   Collection Time: 10/14/17 11:00 AM  Result Value Ref Range Status   Enterococcus species NOT DETECTED NOT DETECTED Final   Listeria monocytogenes NOT DETECTED NOT DETECTED Final   Staphylococcus species NOT DETECTED NOT DETECTED Final   Staphylococcus aureus NOT DETECTED NOT DETECTED Final   Streptococcus species NOT DETECTED NOT DETECTED Final   Streptococcus agalactiae NOT DETECTED NOT DETECTED Final   Streptococcus pneumoniae NOT DETECTED NOT DETECTED Final   Streptococcus pyogenes NOT DETECTED NOT DETECTED Final   Acinetobacter baumannii NOT DETECTED NOT DETECTED Final   Enterobacteriaceae species DETECTED (A) NOT DETECTED Final    Comment: Enterobacteriaceae represent a large family of gram-negative bacteria, not a single organism. CRITICAL RESULT CALLED TO, READ BACK BY AND VERIFIED WITH: Alvira Monday [facility] 2157 10/14/17 A BROWNING    Enterobacter cloacae complex DETECTED (A) NOT DETECTED Final    Comment: CRITICAL RESULT CALLED TO, READ BACK BY AND VERIFIED WITH: Alvira Monday PHARMD 2157 10/14/17 A BROWNING    Escherichia coli NOT DETECTED NOT DETECTED Final   Klebsiella oxytoca NOT  DETECTED NOT DETECTED Final   Klebsiella pneumoniae NOT DETECTED NOT DETECTED Final   Proteus species NOT DETECTED NOT DETECTED Final   Serratia marcescens NOT DETECTED NOT DETECTED Final   Carbapenem resistance NOT DETECTED NOT DETECTED Final   Haemophilus influenzae NOT DETECTED NOT DETECTED Final   Neisseria meningitidis NOT DETECTED NOT DETECTED Final   Pseudomonas aeruginosa NOT DETECTED NOT DETECTED Final   Candida albicans NOT DETECTED NOT DETECTED Final   Candida glabrata NOT DETECTED NOT DETECTED Final   Candida krusei NOT DETECTED NOT DETECTED Final   Candida parapsilosis NOT DETECTED NOT DETECTED Final   Candida tropicalis NOT DETECTED NOT DETECTED Final    Comment: Performed at [facility], [place] [address] [place], [place] [address]  MRSA PCR Screening     Status: None   Collection Time: 10/14/17  4:52 PM  Result Value Ref Range Status   MRSA by PCR NEGATIVE NEGATIVE Final    Comment:        The GeneXpert MRSA Assay (FDA approved for NASAL specimens only), is one component of a comprehensive MRSA colonization surveillance program. It is not intended to diagnose MRSA infection nor to guide or monitor treatment for MRSA infections. Performed at [facility], [place] [address] [place], [place] [address]   C difficile quick scan w PCR reflex     Status: None   Collection Time: 10/16/17  9:09 AM  Result Value Ref Range Status   C Diff antigen NEGATIVE NEGATIVE Final   C Diff toxin NEGATIVE NEGATIVE Final   C Diff interpretation No C. difficile detected.  Final    Comment: Performed at [facility], [place] [address], [place], [place] [address]     Scheduled Meds: . alpha-1-proteinase inhibitor (  human)  4,964 mg Intravenous Weekly  . budesonide (PULMICORT) nebulizer solution  0.5 mg Nebulization BID  . chlorhexidine gluconate (MEDLINE KIT)  15 mL Mouth Rinse BID  . clonazePAM  1 mg Per Tube Q8H  . free water  200 mL Per Tube Q4H  . insulin aspart  0-15  Units Subcutaneous Q4H  . mouth rinse  15 mL Mouth Rinse 10 times per day  . methylPREDNISolone (SOLU-MEDROL) injection  40 mg Intravenous Q12H  . midodrine  5 mg Per Tube TID WC  . pantoprazole sodium  40 mg Per Tube Daily  . PARoxetine  30 mg Per Tube Daily  . QUEtiapine  100 mg Per Tube BID  . rOPINIRole  0.5 mg Per Tube QHS     LOS: 4 days   Cherene Altes, MD [facility] Office  [phone] Pager - Text Page per Shea Evans as per below:  On-Call/Text Page:      [email or web address]      password [email or web address]  If 7PM-7AM, please contact night-coverage [email or web address] Password [facility] 10/18/2017, 8:48 AM

## 2017-10-19 LAB — GLUCOSE, CAPILLARY
GLUCOSE-CAPILLARY: 159 mg/dL — AB (ref 65–99)
GLUCOSE-CAPILLARY: 139 mg/dL — AB (ref 65–99)
Glucose-Capillary: 108 mg/dL — ABNORMAL HIGH (ref 65–99)
Glucose-Capillary: 114 mg/dL — ABNORMAL HIGH (ref 65–99)
Glucose-Capillary: 130 mg/dL — ABNORMAL HIGH (ref 65–99)
Glucose-Capillary: 149 mg/dL — ABNORMAL HIGH (ref 65–99)
Glucose-Capillary: 97 mg/dL (ref 65–99)

## 2017-10-19 NOTE — Progress Notes (Signed)
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 On 6/18 she developed respiratory distress with RR in 40s and HR in 140s and was brought to [facility] for further management.  CTa revealed a small PE.  In the ED she was observed to "vomit" a large amount of mucous, after which her resp status markedly improved.    Significant Events: 6/18 admit 6/19 B LE venous duplex - negative for DVT   Subjective: Having some resp distress this morning, though sats and ventilation are stable.  Respiratory is changing her inner-cannula.  Otherwise no new complaints.    Assessment & Plan:  Enterobacter PNA / Bacteremia / Aspiration 2 of 2 blood cx + - cont abx tx for 10 days to assure clearance - removed PICC 6/22 and observing a central line holiday (has working peripheral IV but needs PICC for weekly A1AT meds)  Acute on chronic hypoxic and hypercapnic respiratory failure  She has stabilized - doing well on vent - lives in a vent SNF - frequent mucous plugging an issue   Severe COPD / Alpha-1 Antitrypsin deficiency / advanced lung disease / chronic tracheostomy  Per Epic note not transplant candidate per [facility] after inquiry on last admit - appears stable  Acute L PE  Full anticoag for 6 months per PCCM, then lifelong pharmacologic prophylaxis - transitioned to per tube eliquis   Chronic vent dependent No plan to attempt weaning per PCCM - will need vent SNF bed   Chronic Hypotension Cont midodrine - BP stable   Chronic multifactorial normocytic anemia Hgb is stable  Thrombocytopenia Previous HIT eval negative - plt count stable    DM2  controlled without complication 2/70 J5K 5.1 - CBG well controlled   Severe Anxiety On multiple chronic meds which are being continued - appears well controlled today   Diarrhea C diff negative  DVT prophylaxis: eliquis Code Status: FULL CODE Family Communication: no family present at time of exam  Disposition Plan: appears stable to return to vent SNF or LTACH when bed available (if they are able to replace PICC)  Consultants:  PCCM  Antimicrobials:   Unasyn 6/18  Cefepime 6/18 > 6/20 Rocephin 6/21 >  Objective: Blood pressure (!) 149/83, pulse 84, temperature 97.8 F (36.6 C), temperature source Oral, resp. rate (!) 24, height '5\' 4"'$  (1.626 m), weight 92.9 kg (204 lb 12.9 oz), SpO2 97 %.  Intake/Output Summary (Last 24 hours) at 10/19/2017 0845 Last data filed at 10/19/2017 0938 Gross per 24 hour  Intake 2660.29 ml  Output 1250 ml  Net 1410.29 ml   Filed Weights   10/17/17 0500 10/18/17 0500 10/19/17 0500  Weight: 88.2 kg (194 lb 7.1 oz) 93.3 kg (205 lb 11 oz) 92.9 kg (204 lb 12.9 oz)    Examination: General: some low grade anxiety at time of visit  Lungs: Clear to auscultation B - no wheezing or crackles   Cardiovascular: RRR  Abdomen: NT/ND, soft, bowel sounds positive, no mass  Extremities: trace B LE edema  CBC: Recent Labs  Lab 10/14/17 1040  10/16/17 0512 10/17/17 0515 10/18/17 0550  WBC 34.3*   < > 8.1 5.8 6.1  NEUTROABS 28.9*  --   --   --   --   HGB 11.2*   < > 8.5* 8.4* 9.3*  HCT 40.4   < > 30.1* 29.9* 33.0*  MCV 88.4   < > 86.0 85.9 86.4  PLT 311   < > 135* 121* 127*   < > = values in this interval not displayed.   Basic Metabolic Panel: Recent Labs  Lab 10/15/17 0534 10/15/17 1448 10/16/17 0512 10/18/17 0550  NA 142  --  142 143  K 4.0  --  4.2 3.7  CL 100*  --  103 104  CO2 34*  --  33* 32  GLUCOSE 114*  --  161* 125*  BUN 13  --  17 17  CREATININE 0.50  --  0.47 0.53  CALCIUM 8.9  --  9.3 9.4  MG 2.0 1.9 1.9 1.8  PHOS 3.6 3.1  3.0  --    GFR: Estimated Creatinine Clearance: 95 mL/min (by C-G formula based on SCr of 0.53 mg/dL).  Liver Function Tests: Recent Labs  Lab 10/14/17 1040 10/18/17 0550  AST 40 23  ALT 47 28  ALKPHOS 83 44  BILITOT 1.0 0.4  PROT 7.2 5.9*  ALBUMIN 3.9 3.3*   HbA1C: Hgb A1c MFr Bld  Date/Time Value Ref Range Status  09/12/2017 05:17 AM 5.1 4.8 - 5.6 % Final    Comment:    (NOTE) Pre diabetes:          5.7%-6.4% Diabetes:              >6.4% Glycemic control for   <7.0% adults with diabetes     CBG: Recent Labs  Lab 10/18/17 1631 10/18/17 1954 10/19/17 0002 10/19/17 0356 10/19/17 0807  GLUCAP 121* 117* 108* 130* 97    Recent Results (from the past 240 hour(s))  Blood Culture (routine x 2)     Status: Abnormal   Collection Time: 10/14/17 11:00 AM  Result Value Ref Range Status   Specimen Description BLOOD LEFT ANTECUBITAL  Final   Special Requests   Final    BOTTLES DRAWN AEROBIC AND ANAEROBIC Blood Culture adequate volume   Culture  Setup Time   Final    GRAM NEGATIVE RODS IN BOTH AEROBIC AND ANAEROBIC Charna Elizabeth PHARMD 2157 10/14/17 A BROWNING Performed at [facility], [place] [address], [place], [place] [address]    Culture ENTEROBACTER CLOACAE (A)  Final   Report Status 10/16/2017 FINAL  Final   Organism ID, Bacteria ENTEROBACTER CLOACAE  Final      Susceptibility   Enterobacter cloacae - MIC*    AMPICILLIN 16 INTERMEDIATE Intermediate     CEFAZOLIN >=64 RESISTANT Resistant     CEFEPIME <=1 SENSITIVE Sensitive     CEFTAZIDIME <=1 SENSITIVE Sensitive     CEFTRIAXONE <=1 SENSITIVE Sensitive     CIPROFLOXACIN <=0.25 SENSITIVE Sensitive     GENTAMICIN <=1 SENSITIVE Sensitive     IMIPENEM 0.5 SENSITIVE Sensitive     TRIMETH/SULFA <=20 SENSITIVE Sensitive     AMPICILLIN/SULBACTAM 4 SENSITIVE Sensitive     PIP/TAZO <=4 SENSITIVE Sensitive     Extended ESBL NEGATIVE Sensitive     * ENTEROBACTER CLOACAE  Blood Culture (routine x 2)     Status:  Abnormal   Collection Time: 10/14/17 11:00 AM  Result Value Ref Range Status   Specimen Description BLOOD BLOOD RIGHT FOREARM  Final   Special Requests   Final    BOTTLES DRAWN AEROBIC  AND ANAEROBIC Blood Culture results may not be optimal due to an excessive volume of blood received in culture bottles   Culture  Setup Time   Final    IN BOTH AEROBIC AND ANAEROBIC BOTTLES GRAM NEGATIVE RODS CRITICAL VALUE NOTED.  VALUE IS CONSISTENT WITH PREVIOUSLY REPORTED AND CALLED VALUE.    Culture (A)  Final    ENTEROBACTER CLOACAE SUSCEPTIBILITIES PERFORMED ON PREVIOUS CULTURE WITHIN THE LAST 5 DAYS. Performed at [facility], [place] [address] [place], [place] [address]    Report Status 10/17/2017 FINAL  Final  Blood Culture ID Panel (Reflexed)     Status: Abnormal   Collection Time: 10/14/17 11:00 AM  Result Value Ref Range Status   Enterococcus species NOT DETECTED NOT DETECTED Final   Listeria monocytogenes NOT DETECTED NOT DETECTED Final   Staphylococcus species NOT DETECTED NOT DETECTED Final   Staphylococcus aureus NOT DETECTED NOT DETECTED Final   Streptococcus species NOT DETECTED NOT DETECTED Final   Streptococcus agalactiae NOT DETECTED NOT DETECTED Final   Streptococcus pneumoniae NOT DETECTED NOT DETECTED Final   Streptococcus pyogenes NOT DETECTED NOT DETECTED Final   Acinetobacter baumannii NOT DETECTED NOT DETECTED Final   Enterobacteriaceae species DETECTED (A) NOT DETECTED Final    Comment: Enterobacteriaceae represent a large family of gram-negative bacteria, not a single organism. CRITICAL RESULT CALLED TO, READ BACK BY AND VERIFIED WITH: Alvira Monday [facility] 2157 10/14/17 A BROWNING    Enterobacter cloacae complex DETECTED (A) NOT DETECTED Final    Comment: CRITICAL RESULT CALLED TO, READ BACK BY AND VERIFIED WITH: Alvira Monday PHARMD 2157 10/14/17 A BROWNING    Escherichia coli NOT DETECTED NOT DETECTED Final   Klebsiella oxytoca NOT DETECTED NOT DETECTED Final   Klebsiella  pneumoniae NOT DETECTED NOT DETECTED Final   Proteus species NOT DETECTED NOT DETECTED Final   Serratia marcescens NOT DETECTED NOT DETECTED Final   Carbapenem resistance NOT DETECTED NOT DETECTED Final   Haemophilus influenzae NOT DETECTED NOT DETECTED Final   Neisseria meningitidis NOT DETECTED NOT DETECTED Final   Pseudomonas aeruginosa NOT DETECTED NOT DETECTED Final   Candida albicans NOT DETECTED NOT DETECTED Final   Candida glabrata NOT DETECTED NOT DETECTED Final   Candida krusei NOT DETECTED NOT DETECTED Final   Candida parapsilosis NOT DETECTED NOT DETECTED Final   Candida tropicalis NOT DETECTED NOT DETECTED Final    Comment: Performed at [facility], [place] [address], [place], [place] [address]  MRSA PCR Screening     Status: None   Collection Time: 10/14/17  4:52 PM  Result Value Ref Range Status   MRSA by PCR NEGATIVE NEGATIVE Final    Comment:        The GeneXpert MRSA Assay (FDA approved for NASAL specimens only), is one component of a comprehensive MRSA colonization surveillance program. It is not intended to diagnose MRSA infection nor to guide or monitor treatment for MRSA infections. Performed at [facility], [place] [address], [place], [place] [address]   C difficile quick scan w PCR reflex     Status: None   Collection Time: 10/16/17  9:09 AM  Result Value Ref Range Status   C Diff antigen NEGATIVE NEGATIVE Final   C Diff toxin NEGATIVE NEGATIVE Final   C Diff interpretation No C. difficile detected.  Final    Comment: Performed at [facility], [place] [address], [place], [place] [address]     Scheduled Meds: . alpha-1-proteinase inhibitor (human)  4,964 mg Intravenous Weekly  . apixaban  10 mg Per Tube BID   Followed by  . [START ON 10/25/2017] apixaban  5 mg Per Tube BID  . budesonide (PULMICORT) nebulizer solution  0.5 mg Nebulization BID  . chlorhexidine gluconate (MEDLINE KIT)  15 mL Mouth Rinse BID  . clonazePAM  1 mg Per Tube Q8H    . free water  200 mL Per Tube Q4H  . insulin aspart  0-15 Units Subcutaneous Q4H  . mouth rinse  15 mL Mouth Rinse 10 times per day  . midodrine  5 mg Per Tube TID WC  . pantoprazole sodium  40 mg Per Tube Daily  . PARoxetine  30 mg Per Tube Daily  . predniSONE  40 mg Per Tube Q breakfast  . QUEtiapine  100 mg Per Tube BID  . rOPINIRole  0.5 mg Per Tube QHS     LOS: 5 days   Cherene Altes, MD [facility] Office  [phone] Pager - Text Page per Shea Evans as per below:  On-Call/Text Page:      [email or web address]      password [email or web address]  If 7PM-7AM, please contact night-coverage [email or web address] Password [facility] 10/19/2017, 8:45 AM

## 2017-10-20 LAB — CBC
HEMATOCRIT: 32.9 % — AB (ref 36.0–46.0)
Hemoglobin: 9.2 g/dL — ABNORMAL LOW (ref 12.0–15.0)
MCH: 24 pg — AB (ref 26.0–34.0)
MCHC: 28 g/dL — ABNORMAL LOW (ref 30.0–36.0)
MCV: 85.7 fL (ref 78.0–100.0)
PLATELETS: 156 10*3/uL (ref 150–400)
RBC: 3.84 MIL/uL — ABNORMAL LOW (ref 3.87–5.11)
RDW: 16.8 % — AB (ref 11.5–15.5)
WBC: 9.9 10*3/uL (ref 4.0–10.5)

## 2017-10-20 LAB — GLUCOSE, CAPILLARY
GLUCOSE-CAPILLARY: 109 mg/dL — AB (ref 65–99)
Glucose-Capillary: 139 mg/dL — ABNORMAL HIGH (ref 65–99)
Glucose-Capillary: 180 mg/dL — ABNORMAL HIGH (ref 65–99)

## 2017-10-20 LAB — BASIC METABOLIC PANEL
Anion gap: 6 (ref 5–15)
BUN: 16 mg/dL (ref 6–20)
CALCIUM: 9.1 mg/dL (ref 8.9–10.3)
CO2: 33 mmol/L — AB (ref 22–32)
Chloride: 103 mmol/L (ref 101–111)
Creatinine, Ser: 0.44 mg/dL (ref 0.44–1.00)
GFR calc Af Amer: >60 mL/min (ref >60)
GLUCOSE: 142 mg/dL — AB (ref 65–99)
POTASSIUM: 3.4 mmol/L — AB (ref 3.5–5.1)
Sodium: 142 mmol/L (ref 135–145)

## 2017-10-20 MED ORDER — ROPINIROLE HCL 0.5 MG PO TABS: 0.5000 mg | ORAL_TABLET | Freq: Every day | ORAL | Status: AC

## 2017-10-20 MED ORDER — INSULIN ASPART 100 UNIT/ML ~~LOC~~ SOLN: 0.0000 [IU] | SUBCUTANEOUS | 11 refills | Status: AC

## 2017-10-20 MED ORDER — VITAL HIGH PROTEIN PO LIQD: 1000.0000 mL | ORAL | Status: DC

## 2017-10-20 MED ORDER — FREE WATER: 200.0000 mL | Status: DC

## 2017-10-20 MED ORDER — PAROXETINE HCL 30 MG PO TABS: 30.0000 mg | ORAL_TABLET | Freq: Every day | ORAL | Status: AC

## 2017-10-20 MED ORDER — BUDESONIDE 0.5 MG/2ML IN SUSP: 0.5000 mg | Freq: Two times a day (BID) | RESPIRATORY_TRACT | 12 refills | Status: AC

## 2017-10-20 MED ORDER — APIXABAN 5 MG PO TABS: 5.0000 mg | ORAL_TABLET | Freq: Two times a day (BID) | ORAL | Status: DC

## 2017-10-20 MED ORDER — MIDODRINE HCL 5 MG PO TABS: 5.0000 mg | ORAL_TABLET | Freq: Three times a day (TID) | ORAL | Status: AC

## 2017-10-20 MED ORDER — ALPHA1-PROTEINASE INHIBITOR 1000 MG/20ML IV SOLN: 4964.0000 mg | INTRAVENOUS | Status: AC

## 2017-10-20 MED ORDER — CHLORHEXIDINE GLUCONATE 0.12% ORAL RINSE (MEDLINE KIT): 15.0000 mL | Freq: Two times a day (BID) | OROMUCOSAL | 0 refills | Status: AC

## 2017-10-20 MED ORDER — PROMETHAZINE HCL 25 MG/ML IJ SOLN: 12.5000 mg | Freq: Four times a day (QID) | INTRAMUSCULAR | 0 refills | Status: AC | PRN

## 2017-10-20 MED ORDER — LEVALBUTEROL HCL 0.63 MG/3ML IN NEBU: 0.6300 mg | INHALATION_SOLUTION | RESPIRATORY_TRACT | 12 refills | Status: DC | PRN

## 2017-10-20 MED ORDER — LORAZEPAM 1 MG PO TABS: 1.0000 mg | ORAL_TABLET | ORAL | 0 refills | Status: AC | PRN

## 2017-10-20 MED ORDER — APIXABAN 5 MG PO TABS
10.0000 mg | ORAL_TABLET | Freq: Two times a day (BID) | ORAL | Status: DC
Start: 2017-10-20 — End: 2017-11-06

## 2017-10-20 MED ORDER — CLONAZEPAM 1 MG PO TABS: 1.0000 mg | ORAL_TABLET | Freq: Three times a day (TID) | ORAL | 0 refills | Status: AC

## 2017-10-20 MED ORDER — SODIUM CHLORIDE 0.9 % IV SOLN: 2.0000 g | INTRAVENOUS | Status: DC

## 2017-10-20 MED ORDER — PANTOPRAZOLE SODIUM 40 MG PO PACK
40.0000 mg | PACK | Freq: Every day | ORAL | Status: DC
Start: 2017-10-20 — End: 2017-11-06

## 2017-10-20 MED ORDER — POTASSIUM CHLORIDE 20 MEQ/15ML (10%) PO SOLN
40.0000 meq | Freq: Once | ORAL | Status: AC
  Administered 2017-10-20: 40 meq
  Filled 2017-10-20: qty 30

## 2017-10-20 NOTE — Clinical Social Work Placement (Signed)
   CLINICAL SOCIAL WORK PLACEMENT  NOTE  Date:  10/20/2017  Patient Details  Name: Danielle Stevens MRN: [id number] Date of Birth: 12/27/1970  Clinical Social Work is seeking post-discharge placement for this patient at the Skilled  Nursing Facility level of care (*CSW will initial, date and re-position this form in  chart as items are completed):  No   Patient/family provided with [facility] Clinical Social Work Department's list of facilities offering this level of care within the geographic area requested by the patient (or if unable, by the patient's family).  Yes(pt from [facility] )   Patient/family informed of their freedom to choose among providers that offer the needed level of care, that participate in Medicare, Medicaid or managed care program needed by the patient, have an available bed and are willing to accept the patient.  No(pt from [facility] already. )   Patient/family informed of [facility]'s ownership interest in [facility] and [facility], as well as of the fact that they are under no obligation to receive care at these facilities.  PASRR submitted to EDS on 09/26/17     PASRR number received on 10/02/17     Existing PASRR number confirmed on       FL2 transmitted to all facilities in geographic area requested by pt/family on       FL2 transmitted to all facilities within larger geographic area on       Patient informed that his/her managed care company has contracts with or will negotiate with certain facilities, including the following:  ([facility])     Yes   Patient/family informed of bed offers received.  Patient chooses bed at [facility])     Physician recommends and patient chooses bed at      Patient to be transferred to ([facility]) on 10/20/17.  Patient to be transferred to facility by [facility]      Patient family notified on 10/20/17 of transfer.  Name of family member notified:  Hospital doctor (daughter)      PHYSICIAN        Additional Comment:    _______________________________________________ Robb Matar, LCSWA 10/20/2017, 1:09 PM

## 2017-10-20 NOTE — Clinical Social Work Placement (Deleted)
   CLINICAL SOCIAL WORK PLACEMENT  NOTE  Date:  10/20/2017  Patient Details  Name: Danielle Stevens MRN: [id number] Date of Birth: 1970-09-14  Clinical Social Work is seeking post-discharge placement for this patient at the Skilled  Nursing Facility level of care (*CSW will initial, date and re-position this form in  chart as items are completed):  No   Patient/family provided with [facility] Clinical Social Work Department's list of facilities offering this level of care within the geographic area requested by the patient (or if unable, by the patient's family).  No   Patient/family informed of their freedom to choose among providers that offer the needed level of care, that participate in Medicare, Medicaid or managed care program needed by the patient, have an available bed and are willing to accept the patient.  No(pt from [facility] already. )   Patient/family informed of [facility]'s ownership interest in [facility] and [facility], as well as of the fact that they are under no obligation to receive care at these facilities.  PASRR submitted to EDS on 09/26/17     PASRR number received on 10/02/17     Existing PASRR number confirmed on       FL2 transmitted to all facilities in geographic area requested by pt/family on       FL2 transmitted to all facilities within larger geographic area on       Patient informed that his/her managed care company has contracts with or will negotiate with certain facilities, including the following:  ([facility])     Yes   Patient/family informed of bed offers received.  Patient chooses bed at [facility])     Physician recommends and patient chooses bed at      Patient to be transferred to ([facility]) on 10/20/17.  Patient to be transferred to facility by [facility]      Patient family notified on 10/20/17 of transfer.  Name of family member notified:  Hospital doctor (daughter)      PHYSICIAN       Additional Comment:     _______________________________________________ Robb Matar, LCSWA 10/20/2017, 1:06 PM

## 2017-10-20 NOTE — Progress Notes (Addendum)
10:47am- CSW spoke with Misty Stanley from [facility]. CSW was asked to send over progress notes to facility before sending pt back. CSW to get a call back from [facility] on when CSW can send pt back.   CSW continues to follow for further disposition needs at the time of discharge. CSW aware that pt is from [facility]. CSW has reached out to Arion with [facility] to confirm that pt is able to return once medically stable.   Claude Manges Jaggar Benko, MSW, LCSW-A Emergency Department Clinical Social Worker [phone]

## 2017-10-20 NOTE — Discharge Instructions (Addendum)
Pulmonary Embolism A pulmonary embolism (PE) is a sudden blockage or decrease of blood flow in one lung or both lungs. Most blockages come from a blood clot that forms in a lower leg, thigh, or arm vein (deep vein thrombosis, DVT) and travels to the lungs. A clot is blood that has thickened into a gel or solid. PE is a dangerous and life-threatening condition that needs to be treated right away. What are the causes? This condition is usually caused by a blood clot that forms in a vein and moves to the lungs. In rare cases, it may be caused by air, fat, part of a tumor, or other tissue that moves through the veins and into the lungs. What increases the risk? The following factors may make you more likely to develop this condition:  Having DVT or a history of DVT.  Being older than age 49.  Personal or family history of blood clots or blood clotting disease.  Major or lengthy surgery.  Orthopedic surgery, especially hip or knee replacement.  Traumatic injury, such as breaking a hip or leg.  Spinal cord injury.  Stroke.  Taking medicines that contain estrogen. These include birth control pills and hormone replacement therapy.  Long-term (chronic) lung or heart disease.  Cancer and chemotherapy.  Having a central venous catheter.  Pregnancy and the period after delivery.  What are the signs or symptoms? Symptoms of this condition usually start suddenly and include:  Shortness of breath while active or at rest.  Coughing or coughing up blood or blood-tinged mucus.  Chest pain that is often worse with deep breaths.  Rapid or irregular heartbeat.  Feeling light-headed or dizzy.  Fainting.  Feeling anxious.  Sweating.  Pain and swelling in a leg. This is a symptom of DVT, which can lead to PE.  How is this diagnosed? This condition may be diagnosed based on:  Your medical history.  A physical exam.  Blood tests to check blood oxygen level and how well your blood  clots, and a D-dimer blood test, which checks your blood for a substance that is released when a blood clot breaks apart.  CT pulmonary angiogram. This test checks blood flow in and around your lungs.  Ventilation-perfusion scan, also called a lung VQ scan. This test measures air flow and blood flow to the lungs.  Ultrasound of the legs to look for blood clots.  How is this treated? Treatment for this conditions depends on many factors, such as the cause of your PE, your risk for bleeding or developing more clots, and other medical conditions you have. Treatment aims to remove, dissolve, or stop blood clots from forming or growing larger. Treatment may include:  Blood thinning medicines (anticoagulants) to stop clots from forming or growing. These medicines may be given as a pill, as an injection, or through an IV tube (infusion).  Medicines that dissolve clots (thrombolytics).  A procedure in which a flexible tube is used to remove a blood clot (embolectomy) or deliver medicine to destroy it (catheter-directed thrombolysis).  A procedure in which a filter is inserted into a large vein that carries blood to the heart (inferior vena cava). This filter (vena cava filter) catches blood clots before they reach the lungs.  Surgery to remove the clot (surgical embolectomy). This is rare.  You may need a combination of immediate, long-term (up to 3 months after diagnosis), and extended (more than 3 months after diagnosis) treatments. Your treatment may continue for several months (maintenance therapy).   You and your health care provider will work together to choose the treatment program that is best for you. Follow these instructions at home: If you are taking an anticoagulant medicine:  Take the medicine every day at the same time each day.  Understand what foods and drugs interact with your medicine.  Understand the side effects of this medicine, including excessive bruising or bleeding. Ask  your health care provider or pharmacist about other side effects. General instructions  Take over-the-counter and prescription medicines only as told by your health care provider.  Anticoagulant medicines may cause side effects, including easy bruising and difficulty stopping bleeding. If you are prescribed an anticoagulant: ? Hold pressure over cuts for longer than usual. ? Tell your dentist and other health care providers that you are taking anticoagulants before you have any procedure that may cause bleeding. ? Avoid contact sports. ? Be extra careful when handling sharp objects. ? Use a soft toothbrush. Floss with waxed dental floss. ? Shave with an electric razor.  Wear a medical alert bracelet or carry a medical alert card that says you have had a PE.  Ask your health care provider when you may return to your normal activities.  Talk with your health care provider about any travel plans. It is important to make sure that you are still able to take your medicine while on trips.  Keep all follow-up visits as told by your health care provider. This is important. How is this prevented? Take these actions to lower your risk of developing another PE:  Exercise regularly. Take frequent walks. For at least 30 minutes every day, engage in: ? Activity that involves moving your arms and legs. ? Activity that encourages good blood flow through your body by increasing your heart rate.  While traveling, drink plenty of water and avoid drinking alcohol. Ask your health care provider if you should wear below-the-knee compression stockings.  Avoid sitting or lying in bed for long periods of time without moving your legs. Exercise your arms and legs every hour during long-distance travel (over 4 hours).  If you are hospitalized or have surgery, ask your health care provider about your risks and what treatments can help prevent blood clots.  Maintain a healthy weight. Ask your health care  provider what weight is healthy for you.  If you are a woman who is over age 49, avoid unnecessary use of medicines that contain estrogen, including birth control pills.  Do not use any products that contain nicotine or tobacco, such as cigarettes and e-cigarettes. This is especially important if you take estrogen medicines. If you need help quitting, ask your health care provider.  See your health care provider for regular checkups. This may include blood tests and ultrasound testing on your legs to check for new blood clots.  Contact a health care provider if:  You missed a dose of your blood thinner medicine. Get help right away if:  You have new or increased pain, swelling, warmth, or redness in an arm or leg.  You have numbness or tingling in an arm or leg.  You have shortness of breath while active or at rest.  You have chest pain.  You have a rapid or irregular heartbeat.  You feel light-headed or dizzy.  You cough up blood.  You have blood in your vomit, stool, or urine.  You have a fever.  You have abdomen (abdominal) pain.  You have a severe fall or head injury.  You have a   severe headache.  You have vision changes.  You cannot move your arms or legs.  You are confused or have memory loss.  You are bleeding for 10 minutes or more, even with strong pressure on the wound. These symptoms may represent a serious problem that is an emergency. Do not wait to see if the symptoms will go away. Get medical help right away. Call your local emergency services (911 in the [place] Do not drive yourself to the hospital. Summary  A pulmonary embolism (PE) is a sudden blockage or decrease of blood flow in one lung or both lungs. PE is a dangerous and life-threatening condition that needs to be treated right away.  Having deep vein thrombosis (DVT) or a history of DVT is the most common risk factor for PE.  Treatments for this condition usually include medicines to thin  your blood (anticoagulants) or medicines to break apart blood clots (thrombolytics).  If you are prescribed blood thinners, it is important to take the medicine every single day at the same time each day.  If you have signs of PE or DVT, call your local emergency services (911 in the [place] This information is not intended to replace advice given to you by your health care provider. Make sure you discuss any questions you have with your health care provider. Document Released: 04/12/2000 Document Revised: 05/18/2016 Document Reviewed: 05/18/2016 Elsevier Interactive Patient Education  2018 [employer].    Information on my medicine - ELIQUIS (apixaban)   Why was Eliquis prescribed for you? Eliquis was prescribed to treat blood clots that may have been found in the veins of your legs (deep vein thrombosis) or in your lungs (pulmonary embolism) and to reduce the risk of them occurring again.  What do You need to know about Eliquis ? The starting dose is 10 mg (two 5 mg tablets) taken TWICE daily for the FIRST SEVEN (7) DAYS, then on 10/25/17  the dose is reduced to ONE 5 mg tablet taken TWICE daily.  Eliquis may be taken with or without food.   Try to take the dose about the same time in the morning and in the evening. If you have difficulty swallowing the tablet whole please discuss with your pharmacist how to take the medication safely.  Take Eliquis exactly as prescribed and DO NOT stop taking Eliquis without talking to the doctor who prescribed the medication.  Stopping may increase your risk of developing a new blood clot.  Refill your prescription before you run out.  After discharge, you should have regular check-up appointments with your healthcare provider that is prescribing your Eliquis.    What do you do if you miss a dose? If a dose of ELIQUIS is not taken at the scheduled time, take it as soon as possible on the same day and twice-daily administration should be  resumed. The dose should not be doubled to make up for a missed dose.  Important Safety Information A possible side effect of Eliquis is bleeding. You should call your healthcare provider right away if you experience any of the following: ? Bleeding from an injury or your nose that does not stop. ? Unusual colored urine (red or dark brown) or unusual colored stools (red or black). ? Unusual bruising for unknown reasons. ? A serious fall or if you hit your head (even if there is no bleeding).  Some medicines may interact with Eliquis and might increase your risk of bleeding or clotting while on Eliquis. To help  avoid this, consult your healthcare provider or pharmacist prior to using any new prescription or non-prescription medications, including herbals, vitamins, non-steroidal anti-inflammatory drugs (NSAIDs) and supplements.  This website has more information on Eliquis (apixaban): http://www.eliquis.com/eliquis/home

## 2017-10-20 NOTE — Progress Notes (Signed)
CSW has spoken with Misty Stanley from [facility] and was informed that pt can come back to room 304. RN to call report to [phone]. Transportation has been set up for 1pm. There are no further CSW needs. CSW will sign off.    Claude Manges. Basya Casavant, MSW, LCSW-A Emergency Department Clinical Social Worker [phone]

## 2017-10-20 NOTE — Discharge Summary (Signed)
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 On 6/18 she developed respiratory distress with RR in 40s and HR in 140s and was brought to [facility] for further management.  CTa revealed a small PE.  In the ED she was observed to "vomit" a large amount of mucous, after which her resp status markedly improved.  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  Per Epic note not transplant candidate per [facility] after inquiry on last admit - appears stable - cont weekly Prolastin   Acute L PE  Full anticoag for 6 months per PCCM, then lifelong pharmacologic prophylaxis - transitioned to per tube Eliquis during hospital stay w/o difficulty   Chronic vent dependent No plan to attempt weaning per PCCM - return to vent SNF bed   ChronicHypotension Cont midodrine - BP stable   Chronic multifactorial normocytic anemia Hgb is stable - no evidence of bleeding on anticoag   Thrombocytopenia Previous HIT eval negative - plt count stable - avoid Pepcid   DM2 controlled without complication 8/56 D1S 5.1 - CBG well controlled   SevereAnxiety On multiple chronic meds which are being continued - appears well controlled at time of d/c   Diarrhea C diff negative   Allergies as of 10/20/2017      Reactions   Wasp Venom Protein Shortness Of Breath   Adhesive [tape] Other (See Comments)   Bruises    Coconut Oil Hives   Latex Other (See Comments)   Bruises   Robitussin Severe Multi-symp [phenylephrine-dm-gg-apap] Diarrhea, Other (See Comments)   Allergic, per verbal MAR   Shellfish-derived Products Hives, Other (See Comments)   Allergic, per verbal MAR      Medication List    STOP taking these medications   acetaminophen 325 MG tablet Commonly known as:  TYLENOL   ACIDOPHILUS/BIFIDUS PO   alpha-1-proteinase inhibitor (human) 1000 MG SOLR 4,964 mg, Empty Containers Flexible MISC 1 each   enoxaparin 40 MG/0.4ML injection Commonly known as:  LOVENOX   Famotidine 20 MG/2ML  Soln   insulin glargine 100 UNIT/ML injection Commonly known as:  LANTUS   ipratropium-albuterol 0.5-2.5 (3) MG/3ML Soln Commonly known as:  DUONEB   levalbuterol 1.25 MG/0.5ML nebulizer solution Commonly known as:  XOPENEX Replaced by:  levalbuterol 0.63 MG/3ML nebulizer  solution   multivitamin with minerals tablet   NORMAL SALINE FLUSH IV   ondansetron 4 MG/2ML Soln injection Commonly known as:  ZOFRAN   oxyCODONE 5 MG/5ML solution Commonly known as:  ROXICODONE     TAKE these medications   alpha-1-proteinase inhibitor (human) 1000 MG/20ML Soln Commonly known as:  PROLASTIN Inject 4,964 mg into the vein once a week. Start taking on:  10/24/2017   apixaban 5 MG Tabs tablet Commonly known as:  ELIQUIS Place 2 tablets (10 mg total) into feeding tube 2 (two) times daily.   apixaban 5 MG Tabs tablet Commonly known as:  ELIQUIS Place 1 tablet (5 mg total) into feeding tube 2 (two) times daily. Start taking on:  10/25/2017   budesonide 0.5 MG/2ML nebulizer solution Commonly known as:  PULMICORT Take 2 mLs (0.5 mg total) by nebulization 2 (two) times daily. What changed:  when to take this   cefTRIAXone 2 g in sodium chloride 0.9 % 100 mL Inject 2 g into the vein daily.   chlorhexidine gluconate (MEDLINE KIT) 0.12 % solution Commonly known as:  PERIDEX 15 mLs by Mouth Rinse route 2 (two) times daily. What changed:    how much to take  how to take this  when to take this   clonazePAM 1 MG tablet Commonly known as:  KLONOPIN Place 1 tablet (1 mg total) into feeding tube every 8 (eight) hours. What changed:  when to take this   feeding supplement (VITAL HIGH PROTEIN) Liqd liquid Place 1,000 mLs into feeding tube continuous. What changed:    when to take this  additional instructions   free water Soln Place 200 mLs into feeding tube every 4 (four) hours.   insulin aspart 100 UNIT/ML injection Commonly known as:  novoLOG Inject 0-15 Units into the skin every 4 (four) hours. What changed:  how much to take   levalbuterol 0.63 MG/3ML nebulizer solution Commonly known as:  XOPENEX Take 3 mLs (0.63 mg total) by nebulization every 3 (three) hours as needed for wheezing or shortness of breath. Replaces:  levalbuterol 1.25 MG/0.5ML  nebulizer solution   LORazepam 1 MG tablet Commonly known as:  ATIVAN Place 1 tablet (1 mg total) into feeding tube every 2 (two) hours as needed for anxiety. What changed:  how to take this   midodrine 5 MG tablet Commonly known as:  PROAMATINE Place 1 tablet (5 mg total) into feeding tube 3 (three) times daily with meals.   pantoprazole sodium 40 mg/20 mL Pack Commonly known as:  PROTONIX Place 20 mLs (40 mg total) into feeding tube daily.   PARoxetine 30 MG tablet Commonly known as:  PAXIL Place 1 tablet (30 mg total) into feeding tube daily. What changed:  how to take this   polyethylene glycol packet Commonly known as:  MIRALAX / GLYCOLAX Place 17 g into feeding tube daily.   predniSONE 20 MG tablet Commonly known as:  DELTASONE Place 2 tablets (40 mg total) into feeding tube daily with breakfast.   promethazine 25 MG/ML injection Commonly known as:  PHENERGAN Inject 0.5 mLs (12.5 mg total) into the vein every 6 (six) hours as needed for nausea or vomiting.   QUEtiapine 100 MG tablet Commonly known as:  SEROQUEL Place 1 tablet (100 mg total) into feeding tube 2 (two) times daily. What changed:  when to take this   rOPINIRole 0.5 MG tablet Commonly known as:  REQUIP Place 1 tablet (0.5 mg total) into feeding tube at bedtime. What changed:    how to take this  when to take this       Day of Discharge BP 122/74   Pulse 81   Temp 98.5 F (36.9 C) (Oral)   Resp 19   Ht _0  (1.626 m)   Wt 92.9 kg (204 lb 12.9 oz)   LMP  (LMP Unknown)   SpO2 97%   BMI 35.16 kg/m   Physical Exam: General: No acute respiratory distress on vent support  Lungs: Clear to auscultation bilaterally - no wheezing  Cardiovascular: RRR - no M or rub  Abdomen: Nontender, nondistended, soft, bowel sounds positive, no rebound, PEG insertion site clean and dry  Extremities: No significant cyanosis, clubbing, or edema B LE   Basic Metabolic Panel: Recent Labs  Lab 10/14/17 1040  10/15/17 0534 10/15/17 1448 10/16/17 0512 10/18/17 0550 10/20/17 0257  NA 139 142  --  142 143 142  K 4.2 4.0  --  4.2 3.7 3.4*  CL 97* 100*  --  103 104 103  CO2 32 34*  --  33* 32 33*  GLUCOSE 191* 114*  --  161* 125* 142*  BUN 19 13  --  _1 CREATININE 0.72 0.50  --  0.47 0.53 0.44  CALCIUM 9.5 8.9  --  9.3 9.4 9.1  MG  --  2.0 1.9 1.9 1.8  --   PHOS  --  3.6 3.1 3.0  --   --     Liver Function Tests: Recent Labs  Lab 10/14/17 1040 10/18/17 0550  AST 40 23  ALT 47 28  ALKPHOS 83 44  BILITOT 1.0 0.4  PROT 7.2 5.9*  ALBUMIN 3.9 3.3*    CBC: Recent Labs  Lab 10/14/17 1040  10/15/17 1319 10/16/17 0512 10/17/17 0515 10/18/17 0550 10/20/17 0257  WBC 34.3*   < > 10.0 8.1 5.8 6.1 9.9  NEUTROABS 28.9*  --   --   --   --   --   --   HGB 11.2*   < > 8.5* 8.5* 8.4* 9.3* 9.2*  HCT 40.4   < > 30.0* 30.1* 29.9* 33.0* 32.9*  MCV 88.4   < > 86.7 86.0 85.9 86.4 85.7  PLT 311   < > 113* 135* 121* 127* 156   < > = values in this interval not displayed.    CBG: Recent Labs  Lab 10/19/17 1629 10/19/17 1951 10/19/17 2348 10/20/17 0343 10/20/17 0745  GLUCAP 159* 139* 114* 139* 109*    Recent Results (from the past 240 hour(s))  Blood Culture (routine x 2)     Status: Abnormal   Collection Time: 10/14/17 11:00 AM  Result Value Ref Range Status   Specimen Description BLOOD LEFT ANTECUBITAL  Final   Special Requests   Final    BOTTLES DRAWN AEROBIC AND ANAEROBIC Blood Culture adequate volume   Culture  Setup Time   Final    GRAM NEGATIVE RODS IN BOTH AEROBIC AND ANAEROBIC Charna Elizabeth PHARMD 2157 10/14/17 A BROWNING Performed at [facility], [place] [address] [place], [place] [address]    Culture ENTEROBACTER CLOACAE (A)  Final   Report Status 10/16/2017 FINAL  Final   Organism ID, Bacteria  ENTEROBACTER CLOACAE  Final      Susceptibility   Enterobacter cloacae - MIC*    AMPICILLIN 16 INTERMEDIATE Intermediate     CEFAZOLIN >=64 RESISTANT Resistant      CEFEPIME <=1 SENSITIVE Sensitive     CEFTAZIDIME <=1 SENSITIVE Sensitive     CEFTRIAXONE <=1 SENSITIVE Sensitive     CIPROFLOXACIN <=0.25 SENSITIVE Sensitive     GENTAMICIN <=1 SENSITIVE Sensitive     IMIPENEM 0.5 SENSITIVE Sensitive     TRIMETH/SULFA <=20 SENSITIVE Sensitive     AMPICILLIN/SULBACTAM 4 SENSITIVE Sensitive     PIP/TAZO <=4 SENSITIVE Sensitive     Extended ESBL NEGATIVE Sensitive     * ENTEROBACTER CLOACAE  Blood Culture (routine x 2)     Status: Abnormal   Collection Time: 10/14/17 11:00 AM  Result Value Ref Range Status   Specimen Description BLOOD BLOOD RIGHT FOREARM  Final   Special Requests   Final    BOTTLES DRAWN AEROBIC AND ANAEROBIC Blood Culture results may not be optimal due to an excessive volume of blood received in culture bottles   Culture  Setup Time   Final    IN BOTH AEROBIC AND ANAEROBIC BOTTLES GRAM NEGATIVE RODS CRITICAL VALUE NOTED.  VALUE IS CONSISTENT WITH PREVIOUSLY REPORTED AND CALLED VALUE.    Culture (A)  Final    ENTEROBACTER CLOACAE SUSCEPTIBILITIES PERFORMED ON PREVIOUS CULTURE WITHIN THE LAST 5 DAYS. Performed at [facility], [place] [address] [place], [place] [address]    Report Status 10/17/2017 FINAL  Final  Blood Culture ID Panel (Reflexed)     Status: Abnormal   Collection Time: 10/14/17 11:00 AM  Result Value Ref Range Status   Enterococcus species NOT DETECTED NOT DETECTED Final   Listeria monocytogenes NOT DETECTED NOT DETECTED Final   Staphylococcus species NOT DETECTED NOT DETECTED Final   Staphylococcus aureus NOT DETECTED NOT DETECTED Final   Streptococcus species NOT DETECTED NOT DETECTED Final   Streptococcus agalactiae NOT DETECTED NOT DETECTED Final   Streptococcus pneumoniae NOT DETECTED NOT DETECTED Final   Streptococcus pyogenes NOT DETECTED NOT DETECTED Final   Acinetobacter baumannii NOT DETECTED NOT DETECTED Final   Enterobacteriaceae species DETECTED (A) NOT DETECTED Final    Comment:  Enterobacteriaceae represent a large family of gram-negative bacteria, not a single organism. CRITICAL RESULT CALLED TO, READ BACK BY AND VERIFIED WITH: Alvira Monday [facility] 2157 10/14/17 A BROWNING    Enterobacter cloacae complex DETECTED (A) NOT DETECTED Final    Comment: CRITICAL RESULT CALLED TO, READ BACK BY AND VERIFIED WITH: Alvira Monday PHARMD 2157 10/14/17 A BROWNING    Escherichia coli NOT DETECTED NOT DETECTED Final   Klebsiella oxytoca NOT DETECTED NOT DETECTED Final   Klebsiella pneumoniae NOT DETECTED NOT DETECTED Final   Proteus species NOT DETECTED NOT DETECTED Final   Serratia marcescens NOT DETECTED NOT DETECTED Final   Carbapenem resistance NOT DETECTED NOT DETECTED Final   Haemophilus influenzae NOT DETECTED NOT DETECTED Final   Neisseria meningitidis NOT DETECTED NOT DETECTED Final   Pseudomonas aeruginosa NOT DETECTED NOT DETECTED Final   Candida albicans NOT DETECTED NOT DETECTED Final   Candida glabrata NOT DETECTED NOT DETECTED Final   Candida krusei NOT DETECTED NOT DETECTED Final   Candida parapsilosis NOT DETECTED NOT DETECTED Final   Candida tropicalis NOT DETECTED NOT DETECTED Final    Comment: Performed at [facility], [place] [address] [place], [place] [address]  MRSA PCR Screening     Status: None  Collection Time: 10/14/17  4:52 PM  Result Value Ref Range Status   MRSA by PCR NEGATIVE NEGATIVE Final    Comment:        The GeneXpert MRSA Assay (FDA approved for NASAL specimens only), is one component of a comprehensive MRSA colonization surveillance program. It is not intended to diagnose MRSA infection nor to guide or monitor treatment for MRSA infections. Performed at [facility], [place] [address], [place], [place] [address]   C difficile quick scan w PCR reflex     Status: None   Collection Time: 10/16/17  9:09 AM  Result Value Ref Range Status   C Diff antigen NEGATIVE NEGATIVE Final   C Diff toxin NEGATIVE NEGATIVE Final   C Diff  interpretation No C. difficile detected.  Final    Comment: Performed at [facility], [place] [address] [place], [place] [address]     Time spent in discharge (includes decision making & examination of pt): 35 minutes  10/20/2017, 9:41 AM   Cherene Altes, MD [facility] Office  [phone] Pager [phone]  On-Call/Text Page:      [email or web address]      password [facility]

## 2017-10-26 ENCOUNTER — Emergency Department (HOSPITAL_COMMUNITY): Payer: Medicare Other

## 2017-10-26 ENCOUNTER — Inpatient Hospital Stay (HOSPITAL_COMMUNITY): Payer: Medicare Other

## 2017-10-26 ENCOUNTER — Encounter (HOSPITAL_COMMUNITY): Payer: Self-pay | Admitting: Emergency Medicine

## 2017-10-26 ENCOUNTER — Other Ambulatory Visit: Payer: Self-pay

## 2017-10-26 ENCOUNTER — Inpatient Hospital Stay (HOSPITAL_COMMUNITY)
Admission: EM | Admit: 2017-10-26 | Discharge: 2017-11-06 | DRG: 207 | Disposition: A | Discharge status: Skilled Nursing Facility | Payer: Medicare Other | Source: Skilled Nursing Facility | Attending: Family Medicine | Admitting: Family Medicine

## 2017-10-26 DIAGNOSIS — Z87891 Personal history of nicotine dependence: Secondary | ICD-10-CM | POA: Diagnosis not present

## 2017-10-26 DIAGNOSIS — Y92009 Unspecified place in unspecified non-institutional (private) residence as the place of occurrence of the external cause: Secondary | ICD-10-CM

## 2017-10-26 DIAGNOSIS — K219 Gastro-esophageal reflux disease without esophagitis: Secondary | ICD-10-CM | POA: Diagnosis not present

## 2017-10-26 DIAGNOSIS — Z91040 Latex allergy status: Secondary | ICD-10-CM | POA: Diagnosis not present

## 2017-10-26 DIAGNOSIS — E872 Acidosis: Secondary | ICD-10-CM | POA: Diagnosis present

## 2017-10-26 DIAGNOSIS — Z7951 Long term (current) use of inhaled steroids: Secondary | ICD-10-CM | POA: Diagnosis not present

## 2017-10-26 DIAGNOSIS — I10 Essential (primary) hypertension: Secondary | ICD-10-CM | POA: Diagnosis present

## 2017-10-26 DIAGNOSIS — Y95 Nosocomial condition: Secondary | ICD-10-CM | POA: Diagnosis present

## 2017-10-26 DIAGNOSIS — Z794 Long term (current) use of insulin: Secondary | ICD-10-CM

## 2017-10-26 DIAGNOSIS — Z79899 Other long term (current) drug therapy: Secondary | ICD-10-CM | POA: Diagnosis not present

## 2017-10-26 DIAGNOSIS — G2581 Restless legs syndrome: Secondary | ICD-10-CM | POA: Diagnosis present

## 2017-10-26 DIAGNOSIS — R000 Tachycardia, unspecified: Secondary | ICD-10-CM | POA: Diagnosis present

## 2017-10-26 DIAGNOSIS — Z7901 Long term (current) use of anticoagulants: Secondary | ICD-10-CM

## 2017-10-26 DIAGNOSIS — J9622 Acute and chronic respiratory failure with hypercapnia: Secondary | ICD-10-CM | POA: Diagnosis not present

## 2017-10-26 DIAGNOSIS — T17908A Unspecified foreign body in respiratory tract, part unspecified causing other injury, initial encounter: Secondary | ICD-10-CM

## 2017-10-26 DIAGNOSIS — J9311 Primary spontaneous pneumothorax: Secondary | ICD-10-CM | POA: Diagnosis present

## 2017-10-26 DIAGNOSIS — Z931 Gastrostomy status: Secondary | ICD-10-CM

## 2017-10-26 DIAGNOSIS — E8801 Alpha-1-antitrypsin deficiency: Secondary | ICD-10-CM | POA: Diagnosis present

## 2017-10-26 DIAGNOSIS — R0902 Hypoxemia: Secondary | ICD-10-CM

## 2017-10-26 DIAGNOSIS — Z888 Allergy status to other drugs, medicaments and biological substances status: Secondary | ICD-10-CM | POA: Diagnosis not present

## 2017-10-26 DIAGNOSIS — J9621 Acute and chronic respiratory failure with hypoxia: Secondary | ICD-10-CM | POA: Diagnosis present

## 2017-10-26 DIAGNOSIS — E876 Hypokalemia: Secondary | ICD-10-CM | POA: Diagnosis present

## 2017-10-26 DIAGNOSIS — Z7952 Long term (current) use of systemic steroids: Secondary | ICD-10-CM | POA: Diagnosis not present

## 2017-10-26 DIAGNOSIS — E669 Obesity, unspecified: Secondary | ICD-10-CM | POA: Diagnosis present

## 2017-10-26 DIAGNOSIS — Z930 Tracheostomy status: Secondary | ICD-10-CM | POA: Diagnosis not present

## 2017-10-26 DIAGNOSIS — J151 Pneumonia due to Pseudomonas: Secondary | ICD-10-CM | POA: Diagnosis not present

## 2017-10-26 DIAGNOSIS — Z91013 Allergy to seafood: Secondary | ICD-10-CM | POA: Diagnosis not present

## 2017-10-26 DIAGNOSIS — J9690 Respiratory failure, unspecified, unspecified whether with hypoxia or hypercapnia: Secondary | ICD-10-CM

## 2017-10-26 DIAGNOSIS — T380X5A Adverse effect of glucocorticoids and synthetic analogues, initial encounter: Secondary | ICD-10-CM | POA: Diagnosis present

## 2017-10-26 DIAGNOSIS — Z4682 Encounter for fitting and adjustment of non-vascular catheter: Secondary | ICD-10-CM

## 2017-10-26 DIAGNOSIS — Z9911 Dependence on respirator [ventilator] status: Secondary | ICD-10-CM | POA: Diagnosis not present

## 2017-10-26 DIAGNOSIS — J939 Pneumothorax, unspecified: Secondary | ICD-10-CM | POA: Diagnosis present

## 2017-10-26 DIAGNOSIS — F419 Anxiety disorder, unspecified: Secondary | ICD-10-CM | POA: Diagnosis present

## 2017-10-26 DIAGNOSIS — J9383 Other pneumothorax: Secondary | ICD-10-CM

## 2017-10-26 DIAGNOSIS — Z9289 Personal history of other medical treatment: Secondary | ICD-10-CM

## 2017-10-26 DIAGNOSIS — I9589 Other hypotension: Secondary | ICD-10-CM | POA: Diagnosis present

## 2017-10-26 DIAGNOSIS — R609 Edema, unspecified: Secondary | ICD-10-CM | POA: Diagnosis present

## 2017-10-26 DIAGNOSIS — E1165 Type 2 diabetes mellitus with hyperglycemia: Secondary | ICD-10-CM | POA: Diagnosis not present

## 2017-10-26 DIAGNOSIS — J439 Emphysema, unspecified: Secondary | ICD-10-CM | POA: Diagnosis not present

## 2017-10-26 DIAGNOSIS — Z86711 Personal history of pulmonary embolism: Secondary | ICD-10-CM

## 2017-10-26 DIAGNOSIS — R197 Diarrhea, unspecified: Secondary | ICD-10-CM | POA: Diagnosis not present

## 2017-10-26 DIAGNOSIS — K567 Ileus, unspecified: Secondary | ICD-10-CM

## 2017-10-26 DIAGNOSIS — D638 Anemia in other chronic diseases classified elsewhere: Secondary | ICD-10-CM | POA: Diagnosis present

## 2017-10-26 DIAGNOSIS — J449 Chronic obstructive pulmonary disease, unspecified: Secondary | ICD-10-CM

## 2017-10-26 DIAGNOSIS — Z6837 Body mass index (BMI) 37.0-37.9, adult: Secondary | ICD-10-CM

## 2017-10-26 DIAGNOSIS — F418 Other specified anxiety disorders: Secondary | ICD-10-CM | POA: Diagnosis present

## 2017-10-26 LAB — I-STAT ARTERIAL BLOOD GAS, ED
Acid-Base Excess: 7 mmol/L — ABNORMAL HIGH (ref 0.0–2.0)
BICARBONATE: 37.7 mmol/L — AB (ref 20.0–28.0)
O2 Saturation: 80 %
Patient temperature: 100.8
TCO2: 40 mmol/L — AB (ref 22–32)
pH, Arterial: 7.219 — ABNORMAL LOW (ref 7.350–7.450)
pO2, Arterial: 61 mmHg — ABNORMAL LOW (ref 83.0–108.0)

## 2017-10-26 LAB — URINALYSIS, ROUTINE W REFLEX MICROSCOPIC
Bilirubin Urine: NEGATIVE
Glucose, UA: NEGATIVE mg/dL
Hgb urine dipstick: NEGATIVE
KETONES UR: 5 mg/dL — AB
Leukocytes, UA: NEGATIVE
NITRITE: NEGATIVE
PH: 6 (ref 5.0–8.0)
PROTEIN: 30 mg/dL — AB
Specific Gravity, Urine: 1.028 (ref 1.005–1.030)

## 2017-10-26 LAB — CBC
HCT: 40.4 % (ref 36.0–46.0)
HEMOGLOBIN: 11.2 g/dL — AB (ref 12.0–15.0)
MCH: 24.1 pg — ABNORMAL LOW (ref 26.0–34.0)
MCHC: 27.7 g/dL — ABNORMAL LOW (ref 30.0–36.0)
MCV: 86.9 fL (ref 78.0–100.0)
Platelets: 495 10*3/uL — ABNORMAL HIGH (ref 150–400)
RBC: 4.65 MIL/uL (ref 3.87–5.11)
RDW: 17.1 % — ABNORMAL HIGH (ref 11.5–15.5)
WBC: 40.8 10*3/uL — AB (ref 4.0–10.5)

## 2017-10-26 LAB — COMPREHENSIVE METABOLIC PANEL
ALBUMIN: 3 g/dL — AB (ref 3.5–5.0)
ALK PHOS: 92 U/L (ref 38–126)
ALT: 61 U/L — AB (ref 0–44)
AST: 54 U/L — ABNORMAL HIGH (ref 15–41)
Anion gap: 14 (ref 5–15)
BUN: 31 mg/dL — ABNORMAL HIGH (ref 6–20)
CALCIUM: 9.8 mg/dL (ref 8.9–10.3)
CO2: 32 mmol/L (ref 22–32)
CREATININE: 1.02 mg/dL — AB (ref 0.44–1.00)
Chloride: 96 mmol/L — ABNORMAL LOW (ref 98–111)
GFR calc Af Amer: >60 mL/min (ref >60)
GFR calc non Af Amer: >60 mL/min (ref >60)
GLUCOSE: 163 mg/dL — AB (ref 70–99)
Potassium: 4.2 mmol/L (ref 3.5–5.1)
SODIUM: 142 mmol/L (ref 135–145)
Total Bilirubin: 1 mg/dL (ref 0.3–1.2)
Total Protein: 5.9 g/dL — ABNORMAL LOW (ref 6.5–8.1)

## 2017-10-26 LAB — BRAIN NATRIURETIC PEPTIDE: B Natriuretic Peptide: 231.1 pg/mL — ABNORMAL HIGH (ref 0.0–100.0)

## 2017-10-26 LAB — MRSA PCR SCREENING: MRSA BY PCR: NEGATIVE

## 2017-10-26 LAB — GLUCOSE, CAPILLARY
Glucose-Capillary: 197 mg/dL — ABNORMAL HIGH (ref 70–99)
Glucose-Capillary: 210 mg/dL — ABNORMAL HIGH (ref 70–99)

## 2017-10-26 LAB — TROPONIN I: Troponin I: 0.06 ng/mL (ref <0.03)

## 2017-10-26 LAB — I-STAT CG4 LACTIC ACID, ED: LACTIC ACID, VENOUS: 2.39 mmol/L — AB (ref 0.5–1.9)

## 2017-10-26 LAB — LACTIC ACID, PLASMA: Lactic Acid, Venous: 2.5 mmol/L (ref 0.5–1.9)

## 2017-10-26 MED ORDER — BUDESONIDE 0.5 MG/2ML IN SUSP
0.5000 mg | Freq: Two times a day (BID) | RESPIRATORY_TRACT | Status: DC
  Administered 2017-10-26 – 2017-11-06 (×22): 0.5 mg via RESPIRATORY_TRACT
  Filled 2017-10-26 (×22): qty 2

## 2017-10-26 MED ORDER — PROPOFOL 10 MG/ML IV BOLUS
0.5000 mg/kg | INTRAVENOUS | Status: DC | PRN
  Filled 2017-10-26: qty 20

## 2017-10-26 MED ORDER — SODIUM CHLORIDE 0.9 % IV SOLN
250.0000 mL | INTRAVENOUS | Status: DC | PRN
  Administered 2017-10-29 – 2017-11-05 (×5): 250 mL via INTRAVENOUS

## 2017-10-26 MED ORDER — ALBUTEROL (5 MG/ML) CONTINUOUS INHALATION SOLN
10.0000 mg/h | INHALATION_SOLUTION | Freq: Once | RESPIRATORY_TRACT | Status: AC
  Administered 2017-10-26: 10 mg/h via RESPIRATORY_TRACT
  Filled 2017-10-26: qty 20

## 2017-10-26 MED ORDER — ALBUTEROL SULFATE (2.5 MG/3ML) 0.083% IN NEBU
INHALATION_SOLUTION | RESPIRATORY_TRACT | Status: AC
  Administered 2017-10-26: 2.5 mg
  Filled 2017-10-26: qty 3

## 2017-10-26 MED ORDER — ENOXAPARIN SODIUM 30 MG/0.3ML ~~LOC~~ SOLN: 30.0000 mg | Freq: Two times a day (BID) | SUBCUTANEOUS | Status: DC

## 2017-10-26 MED ORDER — SODIUM CHLORIDE 0.9 % IV SOLN
2.0000 g | Freq: Once | INTRAVENOUS | Status: AC
  Administered 2017-10-26: 2 g via INTRAVENOUS
  Filled 2017-10-26: qty 2

## 2017-10-26 MED ORDER — ALBUTEROL SULFATE (2.5 MG/3ML) 0.083% IN NEBU
2.5000 mg | INHALATION_SOLUTION | RESPIRATORY_TRACT | Status: DC
  Administered 2017-10-26 – 2017-11-02 (×41): 2.5 mg via RESPIRATORY_TRACT
  Filled 2017-10-26 (×15): qty 3
  Filled 2017-10-26: qty 0.5
  Filled 2017-10-26 (×23): qty 3

## 2017-10-26 MED ORDER — PREDNISONE 20 MG PO TABS
40.0000 mg | ORAL_TABLET | Freq: Every day | ORAL | Status: DC
  Administered 2017-10-27 – 2017-10-28 (×2): 40 mg
  Filled 2017-10-26 (×2): qty 2

## 2017-10-26 MED ORDER — ALPHA1-PROTEINASE INHIBITOR 1000 MG/20ML IV SOLN: 4964.0000 mg | INTRAVENOUS | Status: DC

## 2017-10-26 MED ORDER — MORPHINE SULFATE (PF) 4 MG/ML IV SOLN
4.0000 mg | Freq: Once | INTRAVENOUS | Status: AC
  Administered 2017-10-26: 4 mg via INTRAVENOUS

## 2017-10-26 MED ORDER — MORPHINE SULFATE (PF) 4 MG/ML IV SOLN
INTRAVENOUS | Status: AC
  Filled 2017-10-26: qty 1

## 2017-10-26 MED ORDER — PAROXETINE HCL 30 MG PO TABS
30.0000 mg | ORAL_TABLET | Freq: Every day | ORAL | Status: DC
  Administered 2017-10-26 – 2017-11-06 (×12): 30 mg
  Filled 2017-10-26 (×12): qty 1

## 2017-10-26 MED ORDER — FAMOTIDINE IN NACL 20-0.9 MG/50ML-% IV SOLN
20.0000 mg | Freq: Two times a day (BID) | INTRAVENOUS | Status: DC
  Administered 2017-10-26 (×2): 20 mg via INTRAVENOUS
  Filled 2017-10-26 (×2): qty 50

## 2017-10-26 MED ORDER — CLONAZEPAM 0.5 MG PO TABS
1.0000 mg | ORAL_TABLET | Freq: Three times a day (TID) | ORAL | Status: DC
  Administered 2017-10-26 – 2017-11-06 (×33): 1 mg
  Filled 2017-10-26 (×33): qty 2

## 2017-10-26 MED ORDER — SODIUM CHLORIDE 0.9 % IV SOLN
1750.0000 mg | Freq: Once | INTRAVENOUS | Status: AC
  Administered 2017-10-26: 1750 mg via INTRAVENOUS
  Filled 2017-10-26: qty 1750

## 2017-10-26 MED ORDER — POLYETHYLENE GLYCOL 3350 17 G PO PACK
17.0000 g | PACK | Freq: Every day | ORAL | Status: DC
  Administered 2017-10-26 – 2017-11-05 (×8): 17 g
  Filled 2017-10-26 (×8): qty 1

## 2017-10-26 MED ORDER — ACETAMINOPHEN 325 MG PO TABS
650.0000 mg | ORAL_TABLET | ORAL | Status: DC | PRN
  Administered 2017-10-26: 650 mg via ORAL
  Filled 2017-10-26: qty 2

## 2017-10-26 MED ORDER — VANCOMYCIN HCL IN DEXTROSE 1-5 GM/200ML-% IV SOLN
1000.0000 mg | Freq: Three times a day (TID) | INTRAVENOUS | Status: DC
  Administered 2017-10-26 – 2017-10-27 (×2): 1000 mg via INTRAVENOUS
  Filled 2017-10-26 (×3): qty 200

## 2017-10-26 MED ORDER — METHYLPREDNISOLONE SODIUM SUCC 125 MG IJ SOLR
125.0000 mg | Freq: Once | INTRAMUSCULAR | Status: AC
  Administered 2017-10-26: 125 mg via INTRAVENOUS
  Filled 2017-10-26: qty 2

## 2017-10-26 MED ORDER — FREE WATER
200.0000 mL | Status: DC
  Administered 2017-10-26 – 2017-11-01 (×38): 200 mL

## 2017-10-26 MED ORDER — CHLORHEXIDINE GLUCONATE 0.12% ORAL RINSE (MEDLINE KIT)
15.0000 mL | Freq: Two times a day (BID) | OROMUCOSAL | Status: DC
Start: 2017-10-26 — End: 2017-11-06
  Administered 2017-10-26 – 2017-11-06 (×19): 15 mL via OROMUCOSAL

## 2017-10-26 MED ORDER — EMPTY CONTAINERS FLEXIBLE MISC
4964.0000 mg | Status: DC
  Administered 2017-10-31: 4964 mg via INTRAVENOUS
  Filled 2017-10-26: qty 4964

## 2017-10-26 MED ORDER — LIDOCAINE-EPINEPHRINE (PF) 2 %-1:200000 IJ SOLN
INTRAMUSCULAR | Status: AC
  Filled 2017-10-26: qty 20

## 2017-10-26 MED ORDER — ALBUTEROL SULFATE (2.5 MG/3ML) 0.083% IN NEBU
INHALATION_SOLUTION | RESPIRATORY_TRACT | Status: AC
  Filled 2017-10-26: qty 3

## 2017-10-26 MED ORDER — LIDOCAINE HCL (PF) 1 % IJ SOLN
30.0000 mL | Freq: Once | INTRAMUSCULAR | Status: DC
  Filled 2017-10-26: qty 30

## 2017-10-26 MED ORDER — SODIUM CHLORIDE 0.9 % IV BOLUS: 2000.0000 mL | Freq: Once | INTRAVENOUS | Status: DC

## 2017-10-26 MED ORDER — ORAL CARE MOUTH RINSE
15.0000 mL | OROMUCOSAL | Status: DC
  Administered 2017-10-26 – 2017-11-06 (×77): 15 mL via OROMUCOSAL

## 2017-10-26 MED ORDER — PANTOPRAZOLE SODIUM 40 MG PO PACK
40.0000 mg | PACK | Freq: Every day | ORAL | Status: DC
  Administered 2017-10-26 – 2017-11-06 (×11): 40 mg
  Filled 2017-10-26 (×12): qty 20

## 2017-10-26 MED ORDER — APIXABAN 5 MG PO TABS
5.0000 mg | ORAL_TABLET | Freq: Two times a day (BID) | ORAL | Status: DC
  Administered 2017-10-26 – 2017-10-28 (×5): 5 mg via ORAL
  Filled 2017-10-26 (×5): qty 1

## 2017-10-26 MED ORDER — FENTANYL CITRATE (PF) 100 MCG/2ML IJ SOLN
25.0000 ug | INTRAMUSCULAR | Status: DC | PRN
  Administered 2017-10-26 – 2017-10-29 (×7): 25 ug via INTRAVENOUS
  Filled 2017-10-26 (×6): qty 2

## 2017-10-26 MED ORDER — IPRATROPIUM BROMIDE 0.02 % IN SOLN
0.5000 mg | Freq: Once | RESPIRATORY_TRACT | Status: AC
  Administered 2017-10-26: 0.5 mg via RESPIRATORY_TRACT
  Filled 2017-10-26: qty 2.5

## 2017-10-26 MED ORDER — ALBUTEROL SULFATE (2.5 MG/3ML) 0.083% IN NEBU
INHALATION_SOLUTION | RESPIRATORY_TRACT | Status: AC
  Administered 2017-10-26: 16:00:00
  Filled 2017-10-26: qty 3

## 2017-10-26 MED ORDER — QUETIAPINE FUMARATE 100 MG PO TABS
100.0000 mg | ORAL_TABLET | Freq: Two times a day (BID) | ORAL | Status: DC
  Administered 2017-10-26 – 2017-11-06 (×23): 100 mg
  Filled 2017-10-26 (×23): qty 1

## 2017-10-26 MED ORDER — INSULIN ASPART 100 UNIT/ML ~~LOC~~ SOLN
0.0000 [IU] | SUBCUTANEOUS | Status: DC
  Administered 2017-10-26: 2 [IU] via SUBCUTANEOUS
  Administered 2017-10-26: 3 [IU] via SUBCUTANEOUS
  Administered 2017-10-27: 1 [IU] via SUBCUTANEOUS
  Administered 2017-10-27: 2 [IU] via SUBCUTANEOUS
  Administered 2017-10-27: 1 [IU] via SUBCUTANEOUS
  Administered 2017-10-27 – 2017-10-28 (×5): 2 [IU] via SUBCUTANEOUS
  Administered 2017-10-28: 1 [IU] via SUBCUTANEOUS
  Administered 2017-10-28 (×2): 2 [IU] via SUBCUTANEOUS
  Administered 2017-10-29 (×3): 1 [IU] via SUBCUTANEOUS
  Administered 2017-10-29: 2 [IU] via SUBCUTANEOUS
  Administered 2017-10-29: 1 [IU] via SUBCUTANEOUS
  Administered 2017-10-30: 2 [IU] via SUBCUTANEOUS
  Administered 2017-10-30: 1 [IU] via SUBCUTANEOUS
  Administered 2017-10-30 (×3): 2 [IU] via SUBCUTANEOUS
  Administered 2017-10-31 (×3): 1 [IU] via SUBCUTANEOUS
  Administered 2017-10-31 – 2017-11-01 (×4): 2 [IU] via SUBCUTANEOUS
  Administered 2017-11-01: 1 [IU] via SUBCUTANEOUS
  Administered 2017-11-06: 2 [IU] via SUBCUTANEOUS

## 2017-10-26 MED ORDER — ROPINIROLE HCL 0.5 MG PO TABS
0.5000 mg | ORAL_TABLET | Freq: Every day | ORAL | Status: DC
  Administered 2017-10-26 – 2017-11-05 (×11): 0.5 mg
  Filled 2017-10-26 (×11): qty 1

## 2017-10-26 NOTE — Progress Notes (Signed)
eLink Physician-Brief Progress Note Patient Name: Danielle Stevens DOB: 10/19/1970 MRN: [id number]   Date of Service  10/26/2017  HPI/Events of Note  Multiple issues: 1. Hyperglycemia - Blood glucose = 197 and 2. Patient c/o pain. Intubated and ventilated.  BP = 95/64 with MAP = 75.  eICU Interventions  Will order: 1. Q 4 hour sensitive Novolog SSI.  2. Fentanyl 25-50 mcg IV Q 2 hours PRN pain.      Intervention Category Major Interventions: Hyperglycemia - active titration of insulin therapy Intermediate Interventions: Pain - evaluation and management  [name],[name] 10/26/2017, 8:31 PM

## 2017-10-26 NOTE — Progress Notes (Signed)
Panic ABG results reported to ED MD.  No new RT orders received at this time, MD preparing for chest tube placement.  Pt is currently awake, communicating (saying she's hot & asking for fan, asking for breathing treatment= currently receiving).

## 2017-10-26 NOTE — ED Triage Notes (Signed)
[facility] presenting with resp distress on vent with trach , Hr 150's

## 2017-10-26 NOTE — ED Notes (Signed)
Contact is [facility] doctor (listed in demographics) and sister Kendal Hymen [phone])

## 2017-10-26 NOTE — Progress Notes (Signed)
CRITICAL VALUE ALERT  Critical Value:  Lactic acid 2.5  Date & Time Notied:  10/26/17 1610  Provider Notified: Dr. Franchot Erichsen  Orders Received/Actions taken: No new orders

## 2017-10-26 NOTE — Procedures (Addendum)
.       [address]       [place] [address]             [phone]      Chest Tube Insertion Procedure Note  Indications:  Clinically significant Pneumothorax  Pre-operative Diagnosis: Pneumothorax  Post-operative Diagnosis: Pneumothorax  Procedure Details  Informed consent was obtained for the procedure, including sedation.  Risks of lung perforation, hemorrhage, arrhythmia, and adverse drug reaction were discussed.   After sterile skin prep, using standard technique, a 20 French tube was placed in the left anterior 4 rib space.  Findings: air  Estimated Blood Loss:  Minimal         Specimens:  None              Complications:  None; patient tolerated the procedure well.         Disposition: ICU - intubated and critically ill.         Condition: stable  Attending Attestation: I performed the procedure.   I discussed with the patient and her daughter and sister the problem with incomplete inflation of the lung after previous chest tube, recommended a second anterior chest tube be placed , daughter permit, patient awake and agreeded

## 2017-10-26 NOTE — Progress Notes (Signed)
Pharmacy Antibiotic Note  Alessia Lojewski is a 47 y.o. female admitted on 10/26/2017 with respiratory distress.  She was recently admitted (6/18-6/24/19) and then discharged to [facility].  Pharmacy has been consulted for vancomycin dosing for PNA.  Labs from 10/20/17: SCr 0.44, CrCL 103 ml/min Today's labs - pending collection Tmax 100.8   Plan: Vanc 1750mg  IV x 1, then 1gm IV Q8H x8 days total Monitor renal fxn, clinical progress, vanc trough at Css F/U with continuation of Gram negative coverage   Height: 5\' 8"  (172.7 cm) Weight: 208 lb 8.9 oz (94.6 kg) IBW/kg (Calculated) : 63.9  Temp (24hrs), Avg:100.8 F (38.2 C), Min:100.8 F (38.2 C), Max:100.8 F (38.2 C)  Recent Labs  Lab 10/20/17 0257  WBC 9.9  CREATININE 0.44    Estimated Creatinine Clearance: 103.5 mL/min (by C-G formula based on SCr of 0.44 mg/dL).    Allergies  Allergen Reactions  . Wasp Venom Protein Shortness Of Breath  . Adhesive [Tape] Other (See Comments)    Bruises   . Coconut Oil Hives  . Latex Other (See Comments)    Bruises   . Robitussin Severe Multi-Symp [Phenylephrine-Dm-Gg-Apap] Diarrhea and Other (See Comments)    Allergic, per verbal MAR  . Shellfish-Derived Products Hives and Other (See Comments)    Allergic, per verbal MAR     Ireoluwa Gorsline D. Laney Potash, PharmD, BCPS, BCCCP 10/26/2017, 10:57 AM

## 2017-10-26 NOTE — ED Provider Notes (Signed)
[facility]ERGENCY DEPARTMENT Provider Note   CSN: [id number] Arrival date & time: 10/26/17  0900     History   Chief Complaint Chief Complaint  Patient presents with  . [facility]/resp. distress    HPI Christabel Camire is a 47 y.o. female.  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  Patient has history of alpha-1 antitrypsin deficiency and severe emphysema/COPD.  According to medical records, unfortunately she is not a candidate for transplant.  Patient was recently in the hospital on June 18 and was discharged on June 24.  Patient started having increasing respiratory difficulty at [facility].  Unclear when this exactly started but it sounds like in the last day or so.  This morning the patient was becoming more and more short of breath.  She was tachycardic and tachypneic so she was transferred to the ED for further evaluation.  Patient is able to nod her head in response.  She is feeling very short of breath. Past Medical History:  Diagnosis Date  . Alpha-1-antitrypsin deficiency ([place])   . Anemia   . Anxiety   . Asthma   . COPD (chronic obstructive pulmonary disease) ([place])   . Diabetes mellitus without complication ([place])   . Emphysema lung ([place])   . Hypertension     Patient Active Problem List   Diagnosis Date Noted  . Acute on chronic respiratory failure ([place]) 10/14/2017  . Goals of care, counseling/discussion   . Palliative care by specialist   . Generalized anxiety disorder   . Hypoxemia   . Tracheostomy status ([place])   . Alpha-1-antitrypsin deficiency ([place])   . Hypotension 08/29/2017  . Acute on chronic respiratory failure with hypoxia and hypercapnia (H[facility])   . COPD exacerbation ([place])   . VAP (ventilator-associated pneumonia) ([place])   . Hypercarbia 08/22/2017    Past Surgical History:  Procedure Laterality Date  . PEG TUBE PLACEMENT    . PORT-A-CATH REMOVAL Left 08/26/2017   Procedure: REMOVAL PORT-A-CATH;  Surgeon: Donnie Mesa, MD;  Location: [facility];  Service: General;  Laterality: Left;  . PORTACATH PLACEMENT Right   . TRACHEOSTOMY       OB History   None      Home Medications    Prior to Admission medications   Medication Sig Start Date End Date Taking? Authorizing Provider  alpha-1-proteinase inhibitor, human, (PROLASTIN) 1000 MG/20ML SOLN Inject 4,964 mg into the vein once a week. 10/24/17   Cherene Altes, MD  apixaban (ELIQUIS) 5 MG TABS tablet Place 2 tablets (10 mg total) into feeding tube 2 (two) times daily. 10/20/17   Cherene Altes, MD  apixaban (ELIQUIS) 5 MG TABS tablet Place 1 tablet (5 mg total) into feeding tube 2 (two) times daily. 10/25/17   Cherene Altes, MD  budesonide (PULMICORT) 0.5 MG/2ML nebulizer solution Take 2 mLs (0.5 mg total) by nebulization 2 (two) times daily. 10/20/17   Cherene Altes, MD  cefTRIAXone 2 g in sodium chloride 0.9 % 100 mL Inject 2 g into the vein daily. 10/20/17   Cherene Altes, MD  chlorhexidine gluconate, MEDLINE KIT, (PERIDEX) 0.12 % solution 15 mLs by Mouth Rinse route 2 (two) times daily. 10/20/17   Cherene Altes, MD  clonazePAM (KLONOPIN) 1 MG tablet Place 1 tablet (1 mg total) into feeding tube every 8 (eight) hours. 10/20/17   Cherene Altes, MD  insulin aspart (NOVOLOG) 100 UNIT/ML injection Inject 0-15 Units  into the skin every 4 (four) hours. 10/20/17   Cherene Altes, MD  levalbuterol Penne Lash) 0.63 MG/3ML nebulizer solution Take 3 mLs (0.63 mg total) by nebulization every 3 (three) hours as needed for wheezing or shortness of breath. 10/20/17   Cherene Altes, MD  LORazepam (ATIVAN) 1 MG tablet Place 1 tablet (1 mg total) into feeding tube every 2 (two) hours as needed for anxiety. 10/20/17   Cherene Altes, MD  midodrine (PROAMATINE) 5 MG tablet Place 1 tablet (5 mg total) into feeding tube 3 (three) times daily with meals. 10/20/17   Cherene Altes, MD    Nutritional Supplements (FEEDING SUPPLEMENT, VITAL HIGH PROTEIN,) LIQD liquid Place 1,000 mLs into feeding tube continuous. 10/20/17   Cherene Altes, MD  pantoprazole sodium (PROTONIX) 40 mg/20 mL PACK Place 20 mLs (40 mg total) into feeding tube daily. 10/20/17   Cherene Altes, MD  PARoxetine (PAXIL) 30 MG tablet Place 1 tablet (30 mg total) into feeding tube daily. 10/20/17   Cherene Altes, MD  polyethylene glycol (MIRALAX / Floria Raveling) packet Place 17 g into feeding tube daily.     [provider]  predniSONE (DELTASONE) 20 MG tablet Place 2 tablets (40 mg total) into feeding tube daily with breakfast. 09/26/17   Minor, Grace Bushy, NP  promethazine (PHENERGAN) 25 MG/ML injection Inject 0.5 mLs (12.5 mg total) into the vein every 6 (six) hours as needed for nausea or vomiting. 10/20/17   Cherene Altes, MD  QUEtiapine (SEROQUEL) 100 MG tablet Place 1 tablet (100 mg total) into feeding tube 2 (two) times daily. Patient taking differently: Place 100 mg into feeding tube every 12 (twelve) hours.  09/25/17   Minor, Grace Bushy, NP  rOPINIRole (REQUIP) 0.5 MG tablet Place 1 tablet (0.5 mg total) into feeding tube at bedtime. 10/20/17   Cherene Altes, MD  Water For Irrigation, Sterile (FREE WATER) SOLN Place 200 mLs into feeding tube every 4 (four) hours. 10/20/17   Cherene Altes, MD    Family History History reviewed. No pertinent family history.  Social History Social History   Tobacco Use  . Smoking status: Former Smoker    Types: Cigarettes  . Smokeless tobacco: Never Used  Substance Use Topics  . Alcohol use: Not Currently  . Drug use: Never     Allergies   Wasp venom protein; Adhesive [tape]; Coconut oil; Latex; Robitussin severe multi-symp [[id number]]; and Shellfish-derived products   Review of Systems Review of Systems  All other systems reviewed and are negative.    Physical Exam Updated Vital Signs BP 122/85   Pulse (!) 104    Temp (!) 100.8 F (38.2 C) (Temporal)   Resp (!) 21   Ht 1.727 m (_0 )   Wt 94.6 kg (208 lb 8.9 oz)   SpO2 100%   BMI 31.71 kg/m   Physical Exam  Constitutional: She appears distressed.  Chronically ill-appearing  HENT:  Head: Normocephalic and atraumatic.  Right Ear: External ear normal.  Left Ear: External ear normal.  Eyes: Conjunctivae are normal. Right eye exhibits no discharge. Left eye exhibits no discharge. No scleral icterus.  Neck: Neck supple. No tracheal deviation present.  Cardiovascular: Regular rhythm and intact distal pulses. Tachycardia present.  Pulmonary/Chest: Accessory muscle usage present. No stridor. Tachypnea noted. She is in respiratory distress. She has decreased breath sounds. She has no wheezes. She has no rales.  Abdominal: Soft. Bowel sounds are normal. She exhibits no distension. There is  no tenderness. There is no rebound and no guarding.  Musculoskeletal: She exhibits edema. She exhibits no tenderness.  Diffuse pitting edema in her upper and lower extremities  Neurological: She is alert. She has normal strength. No cranial nerve deficit (no facial droop, extraocular movements intact, no slurred speech) or sensory deficit. She exhibits normal muscle tone. She displays no seizure activity. Coordination normal.  Skin: Skin is warm. No rash noted. She is not diaphoretic.  Psychiatric: She has a normal mood and affect.  Nursing note and vitals reviewed.    ED Treatments / Results  Labs (all labs ordered are listed, but only abnormal results are displayed) Labs Reviewed  CBC - Abnormal; Notable for the following components:      Result Value   WBC 40.8 (*)    Hemoglobin 11.2 (*)    MCH 24.1 (*)    MCHC 27.7 (*)    RDW 17.1 (*)    Platelets 495 (*)    All other components within normal limits  COMPREHENSIVE METABOLIC PANEL - Abnormal; Notable for the following components:   Chloride 96 (*)    Glucose, Bld 163 (*)    BUN 31 (*)    Creatinine,  Ser 1.02 (*)    Total Protein 5.9 (*)    Albumin 3.0 (*)    AST 54 (*)    ALT 61 (*)    All other components within normal limits  TROPONIN I - Abnormal; Notable for the following components:   Troponin I 0.06 (*)    All other components within normal limits  I-STAT ARTERIAL BLOOD GAS, ED - Abnormal; Notable for the following components:   pH, Arterial 7.219 (*)    pCO2 arterial >91.0 (*)    pO2, Arterial 61.0 (*)    Bicarbonate 37.7 (*)    TCO2 40 (*)    Acid-Base Excess 7.0 (*)    All other components within normal limits  I-STAT CG4 LACTIC ACID, ED - Abnormal; Notable for the following components:   Lactic Acid, Venous 2.39 (*)    All other components within normal limits  CULTURE, RESPIRATORY (NON-EXPECTORATED)  CULTURE, BLOOD (ROUTINE X 2)  BRAIN NATRIURETIC PEPTIDE  URINALYSIS, ROUTINE W REFLEX MICROSCOPIC  LACTIC ACID, PLASMA  I-STAT CG4 LACTIC ACID, ED    EKG EKG Interpretation  Date/Time:  Sunday October 26 2017 09:13:56 EDT Ventricular Rate:  153 PR Interval:  118 QRS Duration: 58 QT Interval:  258 QTC Calculation: 411 R Axis:   78 Text Interpretation:  Sinus tachycardia Low voltage QRS Nonspecific T wave abnormality Abnormal ECG Since last tracing rate faster Confirmed by Dorie Rank [phone]) on 10/26/2017 9:37:09 AM Also confirmed by Dorie Rank [phone]), editor Lynder Parents [phone])  on 10/26/2017 11:51:42 AM   Radiology Dg Chest Portable 1 View  Result Date: 10/26/2017 CLINICAL DATA:  Chest tube placement. EXAM: PORTABLE CHEST 1 VIEW COMPARISON:  October 26, 2017 FINDINGS: A chest tube has been placed, terminating over the left lower hemithorax. The large left pneumothorax remains, unchanged with collapse of much of the upper lobe. No mediastinal or cardiac shift noted. Opacity in left base persists. The ETT is in good position. No pneumothorax on the right. Visualized right lung is unremarkable. IMPRESSION: 1. A left chest tube has been placed, terminating over the  lower left hemithorax. There is a persistent large pneumothorax, not significantly changed. No cardiac or mediastinal shift to suggest a tension pneumothorax at this time. The clinical team is aware of the patient's pneumothorax  as it was called after the original chest x-ray. Electronically Signed   By: Dorise Bullion III M.D   On: 10/26/2017 11:01   Dg Chest Portable 1 View  Result Date: 10/26/2017 CLINICAL DATA:  Patient with respiratory distress EXAM: PORTABLE CHEST 1 VIEW COMPARISON:  Chest radiograph 10/16/2017 FINDINGS: ET tube terminates in the mid trachea. Monitoring leads overlie the patient. Large left pneumothorax. Lungs are hyperinflated. Thoracic spine degenerative changes. IMPRESSION: Large left pneumothorax. Critical Value/emergent results were called by telephone at the time of interpretation on 10/26/2017 at 9:46 am to Dr. Dorie Rank , who verbally acknowledged these results. Electronically Signed   By: Lovey Newcomer M.D.   On: 10/26/2017 09:53    Procedures .Critical Care Performed by: Dorie Rank, MD Authorized by: Dorie Rank, MD   Critical care provider statement:    Critical care time (minutes):  35   Critical care was time spent personally by me on the following activities:  Discussions with consultants, evaluation of patient's response to treatment, examination of patient, ordering and performing treatments and interventions, ordering and review of laboratory studies, ordering and review of radiographic studies, pulse oximetry, re-evaluation of patient's condition, obtaining history from patient or surrogate and review of old charts CHEST TUBE INSERTION Date/Time: 10/26/2017 10:35 AM Performed by: Dorie Rank, MD Authorized by: Dorie Rank, MD   Consent:    Consent obtained:  Verbal and emergent situation   Consent given by:  Patient   Risks discussed:  Bleeding, damage to surrounding structures, incomplete drainage, infection and pain   Alternatives discussed:  No  treatment Universal protocol:    Procedure explained and questions answered to patient or proxy's satisfaction: yes     Relevant documents present and verified: yes     Test results available and properly labeled: yes     Imaging studies available: yes     Required blood products, implants, devices, and special equipment available: yes     Site/side marked: yes     Immediately prior to procedure a time out was called: yes     Patient identity confirmed:  Verbally with patient Pre-procedure details:    Skin preparation:  ChloraPrep   Preparation: Patient was prepped and draped in the usual sterile fashion   Sedation:    Sedation type:  Deep Anesthesia (see MAR for exact dosages):    Anesthesia method:  Local infiltration   Local anesthetic:  Lidocaine 1% WITH epi Procedure details:    Placement location:  L lateral   Scalpel size:  11   Tube size (Fr):  Minicatheter (pigtail)   Dissection instrument: seldinger technique.   Ultrasound guidance: no     Tension pneumothorax: no     Tube connected to:  Suction   Drainage characteristics:  Clear (primarily air, some clear fluid)   Dressing:  4x4 sterile gauze and petrolatum-impregnated gauze Post-procedure details:    Post-insertion x-ray findings: tube in good position     Patient tolerance of procedure:  Tolerated well, no immediate complications   (including critical care time)  Medications Ordered in ED Medications  vancomycin (VANCOCIN) 1,750 mg in sodium chloride 0.9 % 500 mL IVPB (1,750 mg Intravenous New Bag/Given 10/26/17 1108)  propofol (DIPRIVAN) 10 mg/mL bolus/IV push 47.3 mg (has no administration in time range)  lidocaine-EPINEPHrine (XYLOCAINE W/EPI) 2 %-1:200000 (PF) injection (has no administration in time range)  sodium chloride 0.9 % bolus 2,000 mL (0 mLs Intravenous Hold 10/26/17 1148)  vancomycin (VANCOCIN) IVPB 1000 mg/200 mL premix (  has no administration in time range)  albuterol (PROVENTIL,VENTOLIN) solution  continuous neb (10 mg/hr Nebulization Given 10/26/17 0923)  ipratropium (ATROVENT) nebulizer solution 0.5 mg (0.5 mg Nebulization Given 10/26/17 0923)  methylPREDNISolone sodium succinate (SOLU-MEDROL) 125 mg/2 mL injection 125 mg (125 mg Intravenous Given 10/26/17 0937)  ceFEPIme (MAXIPIME) 2 g in sodium chloride 0.9 % 100 mL IVPB (2 g Intravenous New Bag/Given 10/26/17 1127)  morphine 4 MG/ML injection 4 mg (4 mg Intravenous Given 10/26/17 0953)     Initial Impression / Assessment and Plan / ED Course  I have reviewed the triage vital signs and the nursing notes.  Pertinent labs & imaging results that were available during my care of the patient were reviewed by me and considered in my medical decision making (see chart for details).  Clinical Course as of Oct 26 1216  Nancy Fetter Oct 26, 2017  2263 She presents in severe respiratory distress.  She is already on a ventilator.  Patient's oxygenation is normal but she clearly has increased work of breathing.  We will proceed with suctioning and x-ray.  Breathing treatment ordered.  Continue current vent settings.  Pressures are high.  We will see what the chest x-ray shows.   [JK]  0929 CXR on my wet read looks like a PTX on the left.  Will set up for chest tube   [JK]  1034 Patient tolerated the pigtail catheter chest tube well   [JK]  1217 Repeat CXR shows persistent PTX.  Chest tube is in the appropriate position.  Checked suction, appears to be functioning properly.   [JK]    Clinical Course User Index [JK] Dorie Rank, MD    Patient presented to the emergency room for evaluation of acute shortness of breath.  Patient has a very complicated history and is chronically on a ventilator that requires high pressures.  Patient's chest x-ray was notable for pneumothorax.  Initial laboratory results indicated a respiratory acidosis.  The rest of her labs are currently pending.  I inserted a pigtail catheter chest tube.  Patient tolerated this procedure  well.  Preliminary review of the post procedure x-ray shows adequate positioning.  Radiologist interpretation is pending.  I will consult with critical care for admission.  Low-grade fever initially.  Antibiotics were started initially.  I will give her a fluid bolus as she has some borderline pressures now.  This however may be a result of the propofol.  We will continue to monitor closely.  Patient appears much more comfortable now after the chest tube  Final Clinical Impressions(s) / ED Diagnoses   Final diagnoses:  Primary spontaneous pneumothorax  Chronic obstructive pulmonary disease, unspecified COPD type ([place])      Dorie Rank, MD 10/26/17 1218

## 2017-10-26 NOTE — Plan of Care (Signed)
  Problem: Education: Goal: Knowledge of General Education information will improve Outcome: Progressing   Problem: Health Behavior/Discharge Planning: Goal: Ability to manage health-related needs will improve Outcome: Progressing   Problem: Clinical Measurements: Goal: Ability to maintain clinical measurements within normal limits will improve Outcome: Not Progressing Goal: Will remain free from infection Outcome: Not Progressing Goal: Diagnostic test results will improve Outcome: Not Progressing Goal: Respiratory complications will improve Outcome: Progressing

## 2017-10-26 NOTE — H&P (Signed)
PULMONARY / CRITICAL CARE MEDICINE   Name: Danielle Stevens MRN: [id number] DOB: Jan 12, 1971    ADMISSION DATE:  10/26/2017    CHIEF COMPLAINT:  Dyspnea, chest pain  HISTORY OF PRESENT ILLNESS: tHE patient presents with left sided chest pain of a few days duration. It was accompanied by increased breathlessness.the patient was nioted on presentaionto have a larg left pTX. The ED physiican placed a pigtail chest tube which did not resolve the PTX although it appears to be in proper position. The patient is chronically ventilator dependent on the basis of an alpha-1- antityrpsin deficiency and COPD. Was judged not to bE transplant candidate. The patient was recently admitted her for pneumonia and pulmonary embolism.   She has been in [facility] the past several months. The patient apparently does have a low grade fever . She has an elevated WBC ass well.    PAST MEDICAL HISTORY :  She  has a past medical history of Alpha-1-antitrypsin deficiency ([place]), Anemia, Anxiety, Asthma, COPD (chronic obstructive pulmonary disease) ([place]), Diabetes mellitus without complication ([place]), Emphysema lung ([place]), and Hypertension.  PAST SURGICAL HISTORY: She  has a past surgical history that includes Tracheostomy; PEG tube placement; Portacath placement (Right); and Port-a-cath removal (Left, 08/26/2017).  Allergies  Allergen Reactions  . Wasp Venom Protein Shortness Of Breath  . Adhesive [Tape] Other (See Comments)    Bruises   . Coconut Oil Hives  . Latex Other (See Comments)    Bruises   . Robitussin Severe Multi-Symp [Phenylephrine-Dm-Gg-Apap] Diarrhea and Other (See Comments)    Allergic, per verbal MAR  . Shellfish-Derived Products Hives and Other (See Comments)    Allergic, per verbal MAR    No current facility-administered medications on file prior to encounter.    Current Outpatient Medications on File Prior to Encounter  Medication Sig  . alpha-1-proteinase inhibitor, human,  (PROLASTIN) 1000 MG/20ML SOLN Inject 4,964 mg into the vein once a week.  Marland Kitchen apixaban (ELIQUIS) 5 MG TABS tablet Place 2 tablets (10 mg total) into feeding tube 2 (two) times daily.  Marland Kitchen apixaban (ELIQUIS) 5 MG TABS tablet Place 1 tablet (5 mg total) into feeding tube 2 (two) times daily.  . budesonide (PULMICORT) 0.5 MG/2ML nebulizer solution Take 2 mLs (0.5 mg total) by nebulization 2 (two) times daily.  . cefTRIAXone 2 g in sodium chloride 0.9 % 100 mL Inject 2 g into the vein daily.  . chlorhexidine gluconate, MEDLINE KIT, (PERIDEX) 0.12 % solution 15 mLs by Mouth Rinse route 2 (two) times daily.  . clonazePAM (KLONOPIN) 1 MG tablet Place 1 tablet (1 mg total) into feeding tube every 8 (eight) hours.  . insulin aspart (NOVOLOG) 100 UNIT/ML injection Inject 0-15 Units into the skin every 4 (four) hours.  Marland Kitchen levalbuterol (XOPENEX) 0.63 MG/3ML nebulizer solution Take 3 mLs (0.63 mg total) by nebulization every 3 (three) hours as needed for wheezing or shortness of breath.  Marland Kitchen LORazepam (ATIVAN) 1 MG tablet Place 1 tablet (1 mg total) into feeding tube every 2 (two) hours as needed for anxiety.  . midodrine (PROAMATINE) 5 MG tablet Place 1 tablet (5 mg total) into feeding tube 3 (three) times daily with meals.  . Nutritional Supplements (FEEDING SUPPLEMENT, VITAL HIGH PROTEIN,) LIQD liquid Place 1,000 mLs into feeding tube continuous.  . pantoprazole sodium (PROTONIX) 40 mg/20 mL PACK Place 20 mLs (40 mg total) into feeding tube daily.  Marland Kitchen PARoxetine (PAXIL) 30 MG tablet Place 1 tablet (30 mg total) into feeding tube daily.  Marland Kitchen  polyethylene glycol (MIRALAX / GLYCOLAX) packet Place 17 g into feeding tube daily.   . predniSONE (DELTASONE) 20 MG tablet Place 2 tablets (40 mg total) into feeding tube daily with breakfast.  . promethazine (PHENERGAN) 25 MG/ML injection Inject 0.5 mLs (12.5 mg total) into the vein every 6 (six) hours as needed for nausea or vomiting.  Marland Kitchen QUEtiapine (SEROQUEL) 100 MG tablet Place  1 tablet (100 mg total) into feeding tube 2 (two) times daily. (Patient taking differently: Place 100 mg into feeding tube every 12 (twelve) hours. )  . rOPINIRole (REQUIP) 0.5 MG tablet Place 1 tablet (0.5 mg total) into feeding tube at bedtime.  . Water For Irrigation, Sterile (FREE WATER) SOLN Place 200 mLs into feeding tube every 4 (four) hours.    FAMILY HISTORY:  Her has no family status information on file.    SOCIAL HISTORY: She  reports that she has quit smoking. Her smoking use included cigarettes. She has never used smokeless tobacco. She reports that she drank alcohol. She reports that she does not use drugs.  REVIEW OF SYSTEMS:   Review of Systems  Constitutional: Positive for fever. Negative for chills and weight loss.  HENT: Negative.   Eyes: Negative.   Respiratory: Positive for shortness of breath. Negative for cough, hemoptysis and sputum production.   Cardiovascular: Positive for chest pain and orthopnea. Negative for palpitations.  Gastrointestinal: Negative.   Genitourinary: Negative.   Musculoskeletal: Negative.   Neurological: Negative.   Endo/Heme/Allergies: Negative.        VITAL SIGNS: BP 122/85   Pulse (!) 104   Temp (!) 100.8 F (38.2 C) (Temporal)   Resp (!) 21   Ht _0  (1.727 m)   Wt 208 lb 8.9 oz (94.6 kg)   SpO2 100%   BMI 31.71 kg/m   HEMODYNAMICS:    VENTILATOR SETTINGS: Vent Mode: PCV FiO2 (%):  [100 %] 100 % Set Rate:  [24 bmp] 24 bmp PEEP:  [8 cmH20] 8 cmH20 Plateau Pressure:  [39 cmH20] 39 cmH20  INTAKE / OUTPUT: No intake/output data recorded.  PHYSICAL EXAMINATION: General:  Patient with tracheostomy in no major distress Neuro: [place]/AT., Cushingoid facies Cardiovascular:  RRR s1s2 Lungs:  No bs appreciated in theleft chest Abdomen: soft, BS, non-tender Extr: [place]/c/e Skin: warm and dry  LABS:  BMET Recent Labs  Lab 10/20/17 0257  NA 142  K 3.4*  CL 103  CO2 33*  BUN 16  CREATININE 0.44  GLUCOSE 142*     Electrolytes Recent Labs  Lab 10/20/17 0257  CALCIUM 9.1    CBC Recent Labs  Lab 10/20/17 0257 10/26/17 1056  WBC 9.9 40.8*  HGB 9.2* 11.2*  HCT 32.9* 40.4  PLT 156 495*    Coag's No results for input(s): APTT, INR in the last 168 hours.  Sepsis Markers Recent Labs  Lab 10/26/17 1059  LATICACIDVEN 2.39*    ABG Recent Labs  Lab 10/26/17 0946  PHART 7.219*  PCO2ART >91.0*  PO2ART 61.0*    Liver Enzymes No results for input(s): AST, ALT, ALKPHOS, BILITOT, ALBUMIN in the last 168 hours.  Cardiac Enzymes No results for input(s): TROPONINI, PROBNP in the last 168 hours.  Glucose Recent Labs  Lab 10/19/17 1629 10/19/17 1951 10/19/17 2348 10/20/17 0343 10/20/17 0745 10/20/17 1135  GLUCAP 159* 139* 114* 139* 109* 180*    Imaging Dg Chest Portable 1 View  Result Date: 10/26/2017 CLINICAL DATA:  Chest tube placement. EXAM: PORTABLE CHEST 1 VIEW COMPARISON:  October 26, 2017 FINDINGS: A chest tube has been placed, terminating over the left lower hemithorax. The large left pneumothorax remains, unchanged with collapse of much of the upper lobe. No mediastinal or cardiac shift noted. Opacity in left base persists. The ETT is in good position. No pneumothorax on the right. Visualized right lung is unremarkable. IMPRESSION: 1. A left chest tube has been placed, terminating over the lower left hemithorax. There is a persistent large pneumothorax, not significantly changed. No cardiac or mediastinal shift to suggest a tension pneumothorax at this time. The clinical team is aware of the patient's pneumothorax as it was called after the original chest x-ray. Electronically Signed   By: Dorise Bullion III M.D   On: 10/26/2017 11:01   Dg Chest Portable 1 View  Result Date: 10/26/2017 CLINICAL DATA:  Patient with respiratory distress EXAM: PORTABLE CHEST 1 VIEW COMPARISON:  Chest radiograph 10/16/2017 FINDINGS: ET tube terminates in the mid trachea. Monitoring leads overlie  the patient. Large left pneumothorax. Lungs are hyperinflated. Thoracic spine degenerative changes. IMPRESSION: Large left pneumothorax. Critical Value/emergent results were called by telephone at the time of interpretation on 10/26/2017 at 9:46 am to Dr. Dorie Rank , who verbally acknowledged these results. Electronically Signed   By: Lovey Newcomer M.D.   On: 10/26/2017 09:53     DISCUSSION:   Patient with known COPD and alpha 1 deficiency presents with a large left PTX.  ASSESSMENT / PLAN:  PULMONARY The patient had a pigtail placed which did not resolve the issue although it appeared to be in the right place. I placed a call to cardiothoracic for there help in placing a larger CT.  The patient is having alow grade temp. I don't see any obvious pneumonia but her WBC is quite elevated as well. The patient has a history of pE but is not on any anticoagualants so That needs to be investigated.     CARDIOVASCULAR  No major history of cardiovascular disease    RENAL  Apparently has normal kidney fn.     INFECTIOUS  Low grade fever We have ordered UA with reflex, blood culutres ansd sputum culture.  ENDOCRINE  Will monitor Blood sugars    Micheal Likens [facility] [place] Pager: [phone]  10/26/2017, 12:02 PM

## 2017-10-26 NOTE — Progress Notes (Signed)
Pt transported to 47M w/ no apparent complications.  Unit RT given report and aware.

## 2017-10-27 DIAGNOSIS — Y92009 Unspecified place in unspecified non-institutional (private) residence as the place of occurrence of the external cause: Secondary | ICD-10-CM | POA: Diagnosis not present

## 2017-10-27 DIAGNOSIS — J9622 Acute and chronic respiratory failure with hypercapnia: Secondary | ICD-10-CM | POA: Diagnosis present

## 2017-10-27 DIAGNOSIS — F419 Anxiety disorder, unspecified: Secondary | ICD-10-CM | POA: Diagnosis not present

## 2017-10-27 DIAGNOSIS — E872 Acidosis: Secondary | ICD-10-CM | POA: Diagnosis not present

## 2017-10-27 DIAGNOSIS — E8801 Alpha-1-antitrypsin deficiency: Secondary | ICD-10-CM | POA: Diagnosis present

## 2017-10-27 DIAGNOSIS — Z91040 Latex allergy status: Secondary | ICD-10-CM | POA: Diagnosis not present

## 2017-10-27 DIAGNOSIS — Z888 Allergy status to other drugs, medicaments and biological substances status: Secondary | ICD-10-CM | POA: Diagnosis not present

## 2017-10-27 DIAGNOSIS — J439 Emphysema, unspecified: Secondary | ICD-10-CM

## 2017-10-27 DIAGNOSIS — Z86711 Personal history of pulmonary embolism: Secondary | ICD-10-CM | POA: Diagnosis not present

## 2017-10-27 DIAGNOSIS — J9383 Other pneumothorax: Secondary | ICD-10-CM | POA: Diagnosis not present

## 2017-10-27 DIAGNOSIS — K219 Gastro-esophageal reflux disease without esophagitis: Secondary | ICD-10-CM | POA: Diagnosis not present

## 2017-10-27 DIAGNOSIS — J9311 Primary spontaneous pneumothorax: Secondary | ICD-10-CM | POA: Diagnosis present

## 2017-10-27 DIAGNOSIS — G2581 Restless legs syndrome: Secondary | ICD-10-CM | POA: Diagnosis not present

## 2017-10-27 DIAGNOSIS — R197 Diarrhea, unspecified: Secondary | ICD-10-CM | POA: Diagnosis not present

## 2017-10-27 DIAGNOSIS — Z9911 Dependence on respirator [ventilator] status: Secondary | ICD-10-CM | POA: Diagnosis not present

## 2017-10-27 DIAGNOSIS — Y95 Nosocomial condition: Secondary | ICD-10-CM | POA: Diagnosis not present

## 2017-10-27 DIAGNOSIS — J9621 Acute and chronic respiratory failure with hypoxia: Secondary | ICD-10-CM

## 2017-10-27 DIAGNOSIS — Z91013 Allergy to seafood: Secondary | ICD-10-CM | POA: Diagnosis not present

## 2017-10-27 DIAGNOSIS — Z930 Tracheostomy status: Secondary | ICD-10-CM | POA: Diagnosis not present

## 2017-10-27 DIAGNOSIS — Z7901 Long term (current) use of anticoagulants: Secondary | ICD-10-CM | POA: Diagnosis not present

## 2017-10-27 DIAGNOSIS — J151 Pneumonia due to Pseudomonas: Secondary | ICD-10-CM | POA: Diagnosis not present

## 2017-10-27 LAB — POCT I-STAT 3, ART BLOOD GAS (G3+)
ACID-BASE EXCESS: 9 mmol/L — AB (ref 0.0–2.0)
Bicarbonate: 32.6 mmol/L — ABNORMAL HIGH (ref 20.0–28.0)
O2 SAT: 95 %
PCO2 ART: 43 mmHg (ref 32.0–48.0)
PH ART: 7.49 — AB (ref 7.350–7.450)
Patient temperature: 99.9
TCO2: 34 mmol/L — ABNORMAL HIGH (ref 22–32)
pO2, Arterial: 72 mmHg — ABNORMAL LOW (ref 83.0–108.0)

## 2017-10-27 LAB — GLUCOSE, CAPILLARY
GLUCOSE-CAPILLARY: 153 mg/dL — AB (ref 70–99)
GLUCOSE-CAPILLARY: 139 mg/dL — AB (ref 70–99)
GLUCOSE-CAPILLARY: 157 mg/dL — AB (ref 70–99)
Glucose-Capillary: 163 mg/dL — ABNORMAL HIGH (ref 70–99)
Glucose-Capillary: 149 mg/dL — ABNORMAL HIGH (ref 70–99)
Glucose-Capillary: 153 mg/dL — ABNORMAL HIGH (ref 70–99)

## 2017-10-27 LAB — CBC
HCT: 33 % — ABNORMAL LOW (ref 36.0–46.0)
HEMOGLOBIN: 9.4 g/dL — AB (ref 12.0–15.0)
MCH: 23.7 pg — AB (ref 26.0–34.0)
MCHC: 28.5 g/dL — AB (ref 30.0–36.0)
MCV: 83.3 fL (ref 78.0–100.0)
PLATELETS: 263 10*3/uL (ref 150–400)
RBC: 3.96 MIL/uL (ref 3.87–5.11)
RDW: 16.8 % — ABNORMAL HIGH (ref 11.5–15.5)
WBC: 21.7 10*3/uL — ABNORMAL HIGH (ref 4.0–10.5)

## 2017-10-27 LAB — MAGNESIUM
MAGNESIUM: 1.8 mg/dL (ref 1.7–2.4)
Magnesium: 1.7 mg/dL (ref 1.7–2.4)

## 2017-10-27 LAB — BASIC METABOLIC PANEL
Anion gap: 13 (ref 5–15)
BUN: 28 mg/dL — AB (ref 6–20)
CHLORIDE: 93 mmol/L — AB (ref 98–111)
CO2: 32 mmol/L (ref 22–32)
CREATININE: 0.74 mg/dL (ref 0.44–1.00)
Calcium: 9.2 mg/dL (ref 8.9–10.3)
GFR calc Af Amer: >60 mL/min (ref >60)
GFR calc non Af Amer: >60 mL/min (ref >60)
GLUCOSE: 185 mg/dL — AB (ref 70–99)
POTASSIUM: 3.8 mmol/L (ref 3.5–5.1)
Sodium: 138 mmol/L (ref 135–145)

## 2017-10-27 LAB — PHOSPHORUS
PHOSPHORUS: 3 mg/dL (ref 2.5–4.6)
Phosphorus: 2.9 mg/dL (ref 2.5–4.6)

## 2017-10-27 MED ORDER — PRO-STAT SUGAR FREE PO LIQD
30.0000 mL | Freq: Two times a day (BID) | ORAL | Status: DC
  Administered 2017-10-27 – 2017-10-28 (×3): 30 mL
  Filled 2017-10-27 (×3): qty 30

## 2017-10-27 MED ORDER — ACETAMINOPHEN 325 MG PO TABS: 650.0000 mg | ORAL_TABLET | ORAL | Status: DC | PRN

## 2017-10-27 MED ORDER — VITAL HIGH PROTEIN PO LIQD
1000.0000 mL | ORAL | Status: DC
  Administered 2017-10-27 – 2017-10-28 (×3): 1000 mL

## 2017-10-27 MED ORDER — SODIUM CHLORIDE 0.9 % IV SOLN
2.0000 g | Freq: Three times a day (TID) | INTRAVENOUS | Status: DC
  Administered 2017-10-27 – 2017-10-28 (×4): 2 g via INTRAVENOUS
  Filled 2017-10-27 (×4): qty 2

## 2017-10-27 MED ORDER — MIDODRINE HCL 5 MG PO TABS
5.0000 mg | ORAL_TABLET | Freq: Three times a day (TID) | ORAL | Status: DC
  Administered 2017-10-27 – 2017-11-06 (×30): 5 mg
  Filled 2017-10-27 (×30): qty 1

## 2017-10-27 MED ORDER — OXYCODONE HCL 5 MG/5ML PO SOLN
5.0000 mg | ORAL | Status: DC | PRN
  Administered 2017-10-27 – 2017-10-28 (×3): 5 mg
  Filled 2017-10-27 (×3): qty 5

## 2017-10-27 MED ORDER — OXYCODONE HCL 5 MG/5ML PO SOLN: 5.0000 mg | ORAL | Status: DC

## 2017-10-27 NOTE — Care Management Note (Signed)
Case Management Note  Patient Details  Name: Eufemia Herlong MRN: [id number] Date of Birth: [birth year]  Subjective/Objective:   From [facility], presents with spontaneous ptx with acute/chronic hypoxic, hypercapnic resp failure, A1AT, copd emphysema, on full vent support.                   Action/Plan: From [facility], will go back to SNF.  Expected Discharge Date:                  Expected Discharge Plan:  Skilled Nursing Facility  In-House Referral:  Clinical Social Work  Discharge planning Services  CM Consult  Post Acute Care Choice:    Choice offered to:     DME Arranged:    DME Agency:     HH Arranged:    HH Agency:     Status of Service:  In process, will continue to follow  If discussed at Long Length of Stay Meetings, dates discussed:    Additional Comments:  Leone Haven, RN 10/27/2017, 1:06 PM

## 2017-10-27 NOTE — Progress Notes (Addendum)
      [address]       [place] [address]             [phone]           Subjective: Patient with trach, resting this am.  Objective: Vital signs in last 24 hours: Temp:  [99.1 F (37.3 C)-101.3 F (38.5 C)] 99.9 F (37.7 C) (07/01 0800) Pulse Rate:  [88-127] 99 (07/01 0800) Cardiac Rhythm: Sinus tachycardia (07/01 0800) Resp:  [6-34] 24 (07/01 0800) BP: (81-135)/(57-104) 99/78 (07/01 0800) SpO2:  [93 %-100 %] 94 % (07/01 0859) FiO2 (%):  [40 %-100 %] 40 % (07/01 0859) Weight:  [205 lb 14.6 oz (93.4 kg)-206 lb 2.1 oz (93.5 kg)] 206 lb 2.1 oz (93.5 kg) (07/01 0430)     Intake/Output from previous day: 06/30 0701 - 07/01 0700 In: 3936.1 [P.O.:240; NG/GT:800; IV Piggyback:2706.1] Out: 700 [Urine:610; Chest Tube:90]   Physical Exam:  Cardiovascular: RRR. Pulmonary: Coarse breath sounds on the right and diminished left base Chest Tubes: to suction, no air leak  Lab Results: CBC: Recent Labs    10/26/17 1056 10/27/17 0524  WBC 40.8* 21.7*  HGB 11.2* 9.4*  HCT 40.4 33.0*  PLT 495* 263   BMET:  Recent Labs    10/26/17 1056 10/27/17 0524  NA 142 138  K 4.2 3.8  CL 96* 93*  CO2 32 32  GLUCOSE 163* 185*  BUN 31* 28*  CREATININE 1.02* 0.74  CALCIUM 9.8 9.2    PT/INR: No results for input(s): LABPROT, INR in the last 72 hours. ABG:  INR: Will add last result for INR, ABG once components are confirmed Will add last 4 CBG results once components are confirmed  Assessment/Plan:  1. CV - SR in the 90's. On Midodrine 5 mg tid and Apixaban 5 mg bid (history of PE). 2.  Pulmonary - History of COPD,emphysema (not lung transplant candidate). S/p 20 French chest tube yesterday as previously placed pigtail but had enlargement of left pneumothorax. Chest tube with 90 cc last 24 hours. Chest tube is to suction. There is no air leak. CXR this am ordered for am. Chest tube  management per CCM/pulmonary. Continue Budesonide bid and Albuterol  nebs. 3. 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 Patient not candidate for any thoracic surgery intervention Please call if need further assistance, will not follow on daily basis I have seen and examined Gray Bernhardt and agree with the above assessment  and plan.  Delight Ovens MD Beeper [phone] Office [phone] 10/27/2017 2:30 PM

## 2017-10-27 NOTE — Evaluation (Signed)
Physical Therapy Evaluation Patient Details Name: Danielle Stevens MRN: [id number] DOB: 1970/11/22 Today's Date: 10/27/2017   History of Present Illness  This 47 y.o. female admitted from [facility] (where she has been for several months ) for Lt sided chest pain, Respiratory failure due to spontaneous pneumothorax.  She unerwent placement of chest tube.  PMH includes:  chronic hypoxic/hypercapnic respiratory failure with A!AT deficiency with severe COPD/emphysema (not a candidate for a lung transplant), s/p trach and PEG   Clinical Impression  Pt admitted with above diagnosis. Pt currently with functional limitations due to the deficits listed below (see PT Problem List).Pt seen in conjunction with OT to address mobility. Pt has been long term pt at [facility] and reports working with therapy and being able to transfer to chair. Pt currently limited in her mobility, by anxiety and pain only agreeing to [facility] and was total Ax2 for rolling side to side. PT will see for a trial to see if she can progress towards her stated prior level of activity at [facility].      Follow Up Recommendations LTACH    Equipment Recommendations  None recommended by PT    Recommendations for Other Services       Precautions / Restrictions Precautions Precautions: Fall Precaution Comments: full vent support.  trach and PEG  Restrictions Weight Bearing Restrictions: No      Mobility  Bed Mobility Overal bed mobility: Needs Assistance Bed Mobility: Rolling Rolling: Max assist;+2 for physical assistance         General bed mobility comments: max A +2 for partial rolling x 4 reps each side.  Pt anxious due to fear of pain, and assists minimally   Transfers                 General transfer comment: Pt refused OOB or EOB               Pertinent Vitals/Pain Pain Assessment: Faces Faces Pain Scale: Hurts even more Pain Location: Lt chest  Pain Descriptors / Indicators: Grimacing;Guarding Pain  Intervention(s): Monitored during session    Home Living Family/patient expects to be discharged to:: Skilled nursing facility                      Prior Function Level of Independence: Needs assistance   Gait / Transfers Assistance Needed: Pt reports she was transferring to chair with therapy at [facility].  Unable to verify that at this time   ADL's / Homemaking Assistance Needed: Pt requires assist for all aspects of ADLs         Hand Dominance   Dominant Hand: Right    Extremity/Trunk Assessment   Upper Extremity Assessment Upper Extremity Assessment: Defer to OT evaluation    Lower Extremity Assessment Lower Extremity Assessment: Generalized weakness       Communication   Communication: Tracheostomy  Cognition Arousal/Alertness: Awake/alert Behavior During Therapy: Anxious Overall Cognitive Status: Within Functional Limits for tasks assessed(for basic tasks )                                        General Comments General comments (skin integrity, edema, etc.): VSS throughout session.  Pt only agreeable to bed level exercise with mod encouragement     Exercises General Exercises - Upper Extremity Shoulder Flexion: AAROM;Right;Left;Supine;PROM;20 reps Elbow Flexion: AROM;AAROM;Right;Left;10 reps;Supine Elbow Extension: AROM;AAROM;Right;Left;10 reps;Supine Digit Composite Flexion: AROM;Right;Left;10 reps;Supine  General Exercises - Lower Extremity Ankle Circles/Pumps: AROM;AAROM;Right;Left;10 reps;Supine Heel Slides: AAROM;Right;Left;5 reps;Supine Hip ABduction/ADduction: AROM;AAROM;Right;Left;10 reps;Supine   Assessment/Plan    PT Assessment Patient needs continued PT services  PT Problem List Decreased strength;Decreased activity tolerance;Decreased balance;Decreased mobility;Decreased cognition;Cardiopulmonary status limiting activity;Pain       PT Treatment Interventions Functional mobility training;Therapeutic  activities;Therapeutic exercise;Balance training;Cognitive remediation;Patient/family education    PT Goals (Current goals can be found in the Care Plan section)  Acute Rehab PT Goals Patient Stated Goal: feel better PT Goal Formulation: With patient Time For Goal Achievement: 11/10/17 Potential to Achieve Goals: Fair    Frequency Min 2X/week     Co-evaluation PT/OT/SLP Co-Evaluation/Treatment: Yes Reason for Co-Treatment: Complexity of the patient's impairments (multi-system involvement) PT goals addressed during session: Strengthening/ROM OT goals addressed during session: Strengthening/ROM       AM-PAC PT "6 Clicks" Daily Activity  Outcome Measure Difficulty turning over in bed (including adjusting bedclothes, sheets and blankets)?: Unable Difficulty moving from lying on back to sitting on the side of the bed? : Unable Difficulty sitting down on and standing up from a chair with arms (e.g., wheelchair, bedside commode, etc,.)?: Unable Help needed moving to and from a bed to chair (including a wheelchair)?: Total Help needed walking in hospital room?: Total Help needed climbing 3-5 steps with a railing? : Total 6 Click Score: 6    End of Session Equipment Utilized During Treatment: Oxygen Activity Tolerance: Other (comment);Patient limited by pain(limited by anxiety) Patient left: in bed;with call bell/phone within reach;with bed alarm set Nurse Communication: Mobility status PT Visit Diagnosis: Other abnormalities of gait and mobility (R26.89);Muscle weakness (generalized) (M62.81);Pain Pain - Right/Left: Right Pain - part of body: (chest at location of chest tubes)    Time: 7846-9629 PT Time Calculation (min) (ACUTE ONLY): 18 min   Charges:   PT Evaluation $PT Eval Moderate Complexity: 1 Mod     PT G Codes:        Jisselle Poth B. Beverely Risen PT, DPT Acute Rehabilitation  [phone] Pager [phone]    Elon Alas Fleet 10/27/2017, 3:39 PM

## 2017-10-27 NOTE — Evaluation (Signed)
Occupational Therapy Evaluation Patient Details Name: Danielle Stevens MRN: [id number] DOB: May 07, 1970 Today's Date: 10/27/2017    History of Present Illness This 47 y.o. female admitted from [facility] (where she has been for several months ) for Lt sided chest pain, Respiratory failure due to spontaneous pneumothorax.  She unerwent placement of chest tube.  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 She demonstrates the below listed deficits and will benefit from continued OT to maximize safety and independence with BADLs.  Pt presents to OT with very limited activity tolerance due to anxiety and Lt chest pain.  She was seen in conjunction with PT, and would only agree to bed level exercise and activity.  She currently requires total A for ADLs and mobility with exception of max A for simple grooming.   She has been at [facility] for several months and has required extensive assist with all activities.  Plan is to return to SNF at discharge.  OT will see for a trial to determine her ability to functionally improve.    Recommend Palliative Care consult.       Follow Up Recommendations  SNF    Equipment Recommendations  None recommended by OT    Recommendations for Other Services       Precautions / Restrictions Precautions Precautions: Fall Precaution Comments: full vent support.  trach and PEG  Restrictions Weight Bearing Restrictions: No      Mobility Bed Mobility Overal bed mobility: Needs Assistance Bed Mobility: Rolling Rolling: Max assist;+2 for physical assistance         General bed mobility comments: max A +2 for partial rolling x 4 reps each side.  Pt anxious due to fear of pain, and assists minimally   Transfers                 General transfer comment: Pt refused OOB or EOB      Balance                                           ADL either performed or assessed with clinical judgement   ADL Overall ADL's : Needs assistance/impaired Eating/Feeding: NPO   Grooming: Wash/dry face;Wash/dry hands;Moderate assistance;Bed level   Upper Body Bathing: Total assistance;Bed level   Lower Body Bathing: Total assistance;Bed level   Upper Body Dressing : Total assistance;Bed level   Lower Body Dressing: Total assistance;Bed level   Toilet Transfer: Total assistance Toilet Transfer Details (indicate cue type and reason): unable to attempt  Toileting- Clothing Manipulation and Hygiene: Total assistance;Bed level         General ADL Comments: Pt very anxious due pain.  Limited activity      Vision         Perception     Praxis      Pertinent Vitals/Pain Pain Assessment: Faces Faces Pain Scale: Hurts even more Pain Location: Lt chest  Pain Descriptors / Indicators: Grimacing;Guarding Pain Intervention(s): Monitored during session;Limited activity within patient's tolerance;Repositioned     Hand Dominance Right   Extremity/Trunk Assessment Upper Extremity Assessment Upper Extremity Assessment: Generalized weakness(moderate edema noted bil. )   Lower Extremity Assessment Lower Extremity Assessment: Defer to PT evaluation       Communication Communication  Communication: Tracheostomy   Cognition Arousal/Alertness: Awake/alert Behavior During Therapy: Anxious Overall Cognitive Status: Within Functional Limits for tasks assessed(for basic tasks )                                     General Comments  VSS throughout session.  Pt only agreeable to bed level exercise with mod encouragement     Exercises Exercises: Other exercises;General Upper Extremity;General Lower Extremity General Exercises - Upper Extremity Shoulder Flexion: AAROM;Right;Left;Supine;PROM;20 reps Elbow Flexion: AROM;AAROM;Right;Left;10  reps;Supine Elbow Extension: AROM;AAROM;Right;Left;10 reps;Supine Digit Composite Flexion: AROM;Right;Left;10 reps;Supine General Exercises - Lower Extremity Ankle Circles/Pumps: AROM;AAROM;Right;Left;10 reps;Supine Heel Slides: AAROM;Right;Left;5 reps;Supine Hip ABduction/ADduction: AROM;AAROM;Right;Left;10 reps;Supine   Shoulder Instructions      Home Living Family/patient expects to be discharged to:: Skilled nursing facility                                        Prior Functioning/Environment Level of Independence: Needs assistance  Gait / Transfers Assistance Needed: Pt reports she was transferring to chair with therapy at [facility].  Unable to verify that at this time  ADL's / Homemaking Assistance Needed: Pt requires assist for all aspects of ADLs  Communication / Swallowing Assistance Needed: trach/vent          OT Problem List: Decreased strength;Decreased range of motion;Decreased activity tolerance;Decreased knowledge of use of DME or AE;Cardiopulmonary status limiting activity;Pain      OT Treatment/Interventions: Self-care/ADL training;Therapeutic exercise;DME and/or AE instruction;Therapeutic activities;Patient/family education;Balance training    OT Goals(Current goals can be found in the care plan section) Acute Rehab OT Goals OT Goal Formulation: With patient Time For Goal Achievement: 11/10/17 Potential to Achieve Goals: Fair ADL Goals Pt Will Perform Grooming: with set-up;with supervision;sitting Pt Will Perform Upper Body Bathing: with min assist;sitting Pt/caregiver will Perform Home Exercise Program: Increased strength;Right Upper extremity;Left upper extremity;With theraband;With minimal assist;With written HEP provided Additional ADL Goal #1: Pt will sit EOB x 5 mins with mod A in prep for ADLs  OT Frequency: Min 2X/week   Barriers to D/C: Decreased caregiver support          Co-evaluation PT/OT/SLP Co-Evaluation/Treatment:  Yes Reason for Co-Treatment: Complexity of the patient's impairments (multi-system involvement)   OT goals addressed during session: Strengthening/ROM      AM-PAC PT "6 Clicks" Daily Activity     Outcome Measure Help from another person eating meals?: Total Help from another person taking care of personal grooming?: A Lot Help from another person toileting, which includes using toliet, bedpan, or urinal?: Total Help from another person bathing (including washing, rinsing, drying)?: Total Help from another person to put on and taking off regular upper body clothing?: Total Help from another person to put on and taking off regular lower body clothing?: Total 6 Click Score: 7   End of Session Nurse Communication: Other (comment)(status of eval/activity )  Activity Tolerance: Patient limited by pain Patient left: in bed;with call bell/phone within reach  OT Visit Diagnosis: Muscle weakness (generalized) (M62.81);Pain Pain - Right/Left: Left Pain - part of body: (chest )                Time: 5643-3295 OT Time Calculation (min): 18 min Charges:  OT General Charges $OT Visit: 1 Visit OT Evaluation $OT Eval Moderate Complexity: 1 Mod G-Codes:  Alvera Tourigny [place], OTR/L [phone]   Jeani Hawking M 10/27/2017, 1:13 PM

## 2017-10-27 NOTE — Progress Notes (Signed)
PULMONARY / CRITICAL CARE MEDICINE   Name: Danielle Stevens MRN: [id number] DOB: August 13, 1970    ADMISSION DATE:  10/26/2017  REFERRING MD:  Dr. Lynelle Doctor, ER  CHIEF COMPLAINT:  Short of breath  HISTORY OF PRESENT ILLNESS:   47 yo female former smoker from [facility] with Lt sided chest pain, respiratory failure from spontaneous pneumothorax.  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  She was in hospital from 10/14/17 to 10/20/17 with Aspiration pneumonia with  Enterobacter bacteremia, Lt lung PE on eliquis.  PAST MEDICAL HISTORY :  Anemia, Severe anxiety, Hypotension, DM  SUBJECTIVE:  Has soreness in Lt chest at chest tube sites.  VITAL SIGNS: BP 94/72   Pulse 94   Temp 99.5 F (37.5 C)   Resp (!) 24   Ht 5\' 8"  (1.727 m)   Wt 206 lb 2.1 oz (93.5 kg)   SpO2 94%   BMI 31.34 kg/m   VENTILATOR SETTINGS: Vent Mode: PCV FiO2 (%):  [40 %-100 %] 40 % Set Rate:  [24 bmp] 24 bmp PEEP:  [8 cmH20] 8 cmH20 Plateau Pressure:  [29 cmH20-39 cmH20] 29 cmH20  INTAKE / OUTPUT: I/O last 3 completed shifts: In: 3936.1 [P.O.:240; Other:190; NG/GT:800; IV Piggyback:2706.1] Out: 700 [Urine:610; Chest Tube:90]  PHYSICAL EXAMINATION:  General - pleasant Eyes - pupils reactive ENT - trach in place Cardiac - regular, no murmur Chest - decreased BS, 2 chest tubes on Lt Abd - soft, non tender Ext - 1+ edema Skin - no rashes Neuro - follows commands   LABS:  BMET Recent Labs  Lab 10/26/17 1056 10/27/17 0524  NA 142 138  K 4.2 3.8  CL 96* 93*  CO2 32 32  BUN 31* 28*  CREATININE 1.02* 0.74  GLUCOSE 163* 185*    Electrolytes Recent Labs  Lab 10/26/17 1056 10/27/17 0524  CALCIUM 9.8 9.2  MG  --  1.7  PHOS  --  2.9    CBC Recent Labs  Lab 10/26/17 1056 10/27/17 0524  WBC 40.8* 21.7*  HGB 11.2* 9.4*  HCT 40.4 33.0*  PLT 495* 263    Coag's No results for input(s): APTT, INR in  the last 168 hours.  Sepsis Markers Recent Labs  Lab 10/26/17 1059 10/26/17 1512  LATICACIDVEN 2.39* 2.5*    ABG Recent Labs  Lab 10/26/17 0946 10/27/17 0324  PHART 7.219* 7.490*  PCO2ART >91.0* 43.0  PO2ART 61.0* 72.0*    Liver Enzymes Recent Labs  Lab 10/26/17 1056  AST 54*  ALT 61*  ALKPHOS 92  BILITOT 1.0  ALBUMIN 3.0*    Cardiac Enzymes Recent Labs  Lab 10/26/17 1056  TROPONINI 0.06*    Glucose Recent Labs  Lab 10/20/17 1135 10/26/17 1927 10/26/17 2322 10/27/17 0322 10/27/17 0737  GLUCAP 180* 197* 210* 163* 153*    Imaging Dg Chest Portable 1 View  Result Date: 10/26/2017 CLINICAL DATA:  47 y/o  F; left chest tube placement. EXAM: PORTABLE CHEST 1 VIEW COMPARISON:  10/26/2017 chest radiograph. FINDINGS: Stable normal cardiac silhouette. Aortic atherosclerosis with calcification. Tracheostomy tube is stable. Two left-sided chest tubes. Mild subcutaneous emphysema in left chest wall. Reinflation of left lung, no residual pneumothorax identified. Small left effusion. Opacity at the left lung base, probably re-expansion edema. No acute osseous abnormality is evident. IMPRESSION: 1. Interval placement of left superior chest tube. No residual pneumothorax identified. Mild left chest wall emphysema. 2. Left basilar opacity, probably re-expansion edema. Small left effusion.  Electronically Signed   By: Mitzi Hansen M.D.   On: 10/26/2017 13:25   Dg Chest Portable 1 View  Result Date: 10/26/2017 CLINICAL DATA:  Chest tube placement. EXAM: PORTABLE CHEST 1 VIEW COMPARISON:  October 26, 2017 FINDINGS: A chest tube has been placed, terminating over the left lower hemithorax. The large left pneumothorax remains, unchanged with collapse of much of the upper lobe. No mediastinal or cardiac shift noted. Opacity in left base persists. The ETT is in good position. No pneumothorax on the right. Visualized right lung is unremarkable. IMPRESSION: 1. A left chest tube  has been placed, terminating over the lower left hemithorax. There is a persistent large pneumothorax, not significantly changed. No cardiac or mediastinal shift to suggest a tension pneumothorax at this time. The clinical team is aware of the patient's pneumothorax as it was called after the original chest x-ray. Electronically Signed   By: Gerome Sam III M.D   On: 10/26/2017 11:01   Dg Chest Portable 1 View  Result Date: 10/26/2017 CLINICAL DATA:  Patient with respiratory distress EXAM: PORTABLE CHEST 1 VIEW COMPARISON:  Chest radiograph 10/16/2017 FINDINGS: ET tube terminates in the mid trachea. Monitoring leads overlie the patient. Large left pneumothorax. Lungs are hyperinflated. Thoracic spine degenerative changes. IMPRESSION: Large left pneumothorax. Critical Value/emergent results were called by telephone at the time of interpretation on 10/26/2017 at 9:46 am to Dr. Linwood Dibbles , who verbally acknowledged these results. Electronically Signed   By: Annia Belt M.D.   On: 10/26/2017 09:53     STUDIES:   CULTURES: Blood 6/30 >> Sputum 6/30 >>  ANTIBIOTICS: Vancomycin 6/30 >> Cefepime 6/30 >>  SIGNIFICANT EVENTS: 6/30 Admit, TCTS consulted, fever 101.3  LINES/TUBES: Trach >>  PEG >> Lt chest tube 6/30 >>   DISCUSSION: 47 yo female with spontaneous Lt PTX with acute on chronic hypoxic/hypercapnic respiratory failure in setting of A1AT with severe COPD/emphysema.  ASSESSMENT / PLAN:  Acute on chronic hypoxic/hypercapnic respiratory failure. A1AT with severe COPD/emphysema. - full vent support - pulmicort, albuterol, prednisone - prolastin every Thursday  Lt pneumothorax. - chest tube per TCTS  - might be able to d/c pig tail catheter >> defer to TCTS - f/u CXR  Hx of PE. - continue eliquis  Chronic hypotension. - midodrine  Fever. - no other signs of infection - monitor off ABx - f/u cultures  Severe anxiety, RLS. - scheduled klonopin, paxil, seroquel,  requip  DM type II. - SSI  DVT prophylaxis - eliquis SUP - protonix Nutrition - tube feeds Goals of care - full code  Will ask [facility] to assume primary care from 7/02 and PCCM will follow for trach/vent management  Coralyn Helling, MD [facility] Pulmonary/Critical Care 10/27/2017, 8:10 AM

## 2017-10-27 NOTE — Progress Notes (Signed)
Pharmacy Antibiotic Note  Danielle Stevens is a 47 y.o. female admitted on 10/26/2017 with respiratory distress.  She was recently admitted (6/18-6/24/19) and then discharged to [facility]. Pharmacy has been consulted for ceftazidime dosing for pseudomonas PNA (S-pending). Tmax/24h 101.3, WBC down to 21.7. SCr stable 0.74, CrCl~100.  6/30 TA - rare GPC in pairs, rare GPR, moderate pseudomonas 6/30 BCx - ngtd 6/30 mrsa pcr: neg  Plan: Ceftazidime 2g IV q8h Monitor clinical progress, c/s, renal function F/u de-escalation plan/LOT   Height: 5\' 8"  (172.7 cm) Weight: 206 lb 2.1 oz (93.5 kg) IBW/kg (Calculated) : 63.9  Temp (24hrs), Avg:99.9 F (37.7 C), Min:99.1 F (37.3 C), Max:101.3 F (38.5 C)  Recent Labs  Lab 10/26/17 1056 10/26/17 1059 10/26/17 1512 10/27/17 0524  WBC 40.8*  --   --  21.7*  CREATININE 1.02*  --   --  0.74  LATICACIDVEN  --  2.39* 2.5*  --     Estimated Creatinine Clearance: 102.8 mL/min (by C-G formula based on SCr of 0.74 mg/dL).    Allergies  Allergen Reactions  . Wasp Venom Protein Shortness Of Breath  . Adhesive [Tape] Other (See Comments)    Bruises   . Coconut Oil Hives  . Latex Other (See Comments)    Bruises   . Robitussin Severe Multi-Symp [Phenylephrine-Dm-Gg-Apap] Diarrhea and Other (See Comments)    Allergic, per verbal MAR  . Shellfish-Derived Products Hives and Other (See Comments)    Allergic, per verbal MAR   Babs Bertin, PharmD, BCPS Clinical Pharmacist 10/27/2017 3:34 PM

## 2017-10-28 ENCOUNTER — Inpatient Hospital Stay (HOSPITAL_COMMUNITY): Payer: Medicare Other

## 2017-10-28 DIAGNOSIS — J9311 Primary spontaneous pneumothorax: Secondary | ICD-10-CM | POA: Diagnosis not present

## 2017-10-28 DIAGNOSIS — J9622 Acute and chronic respiratory failure with hypercapnia: Secondary | ICD-10-CM | POA: Diagnosis not present

## 2017-10-28 DIAGNOSIS — J9621 Acute and chronic respiratory failure with hypoxia: Secondary | ICD-10-CM | POA: Diagnosis not present

## 2017-10-28 LAB — CBC
HCT: 28.9 % — ABNORMAL LOW (ref 36.0–46.0)
Hemoglobin: 8.4 g/dL — ABNORMAL LOW (ref 12.0–15.0)
MCH: 23.8 pg — ABNORMAL LOW (ref 26.0–34.0)
MCHC: 29.1 g/dL — AB (ref 30.0–36.0)
MCV: 81.9 fL (ref 78.0–100.0)
Platelets: 259 10*3/uL (ref 150–400)
RBC: 3.53 MIL/uL — ABNORMAL LOW (ref 3.87–5.11)
RDW: 17 % — AB (ref 11.5–15.5)
WBC: 20.6 10*3/uL — ABNORMAL HIGH (ref 4.0–10.5)

## 2017-10-28 LAB — CULTURE, RESPIRATORY

## 2017-10-28 LAB — CULTURE, RESPIRATORY W GRAM STAIN

## 2017-10-28 LAB — BASIC METABOLIC PANEL
Anion gap: 9 (ref 5–15)
BUN: 32 mg/dL — AB (ref 6–20)
CO2: 32 mmol/L (ref 22–32)
CREATININE: 0.63 mg/dL (ref 0.44–1.00)
Calcium: 8.9 mg/dL (ref 8.9–10.3)
Chloride: 94 mmol/L — ABNORMAL LOW (ref 98–111)
GFR calc Af Amer: >60 mL/min (ref >60)
GLUCOSE: 162 mg/dL — AB (ref 70–99)
Potassium: 3.6 mmol/L (ref 3.5–5.1)
Sodium: 135 mmol/L (ref 135–145)

## 2017-10-28 LAB — PHOSPHORUS: PHOSPHORUS: 2.9 mg/dL (ref 2.5–4.6)

## 2017-10-28 LAB — GLUCOSE, CAPILLARY
GLUCOSE-CAPILLARY: 197 mg/dL — AB (ref 70–99)
GLUCOSE-CAPILLARY: 178 mg/dL — AB (ref 70–99)
GLUCOSE-CAPILLARY: 170 mg/dL — AB (ref 70–99)
Glucose-Capillary: 161 mg/dL — ABNORMAL HIGH (ref 70–99)
Glucose-Capillary: 147 mg/dL — ABNORMAL HIGH (ref 70–99)
Glucose-Capillary: 149 mg/dL — ABNORMAL HIGH (ref 70–99)

## 2017-10-28 LAB — MAGNESIUM: Magnesium: 1.8 mg/dL (ref 1.7–2.4)

## 2017-10-28 MED ORDER — APIXABAN 5 MG PO TABS
5.0000 mg | ORAL_TABLET | Freq: Two times a day (BID) | ORAL | Status: DC
  Administered 2017-10-28 – 2017-11-06 (×18): 5 mg
  Filled 2017-10-28 (×18): qty 1

## 2017-10-28 MED ORDER — ACETAMINOPHEN 160 MG/5ML PO SOLN: 500.0000 mg | ORAL | Status: DC | PRN

## 2017-10-28 MED ORDER — LORAZEPAM 1 MG PO TABS
1.0000 mg | ORAL_TABLET | ORAL | Status: DC | PRN
  Administered 2017-10-30 – 2017-11-03 (×5): 1 mg
  Filled 2017-10-28 (×5): qty 1

## 2017-10-28 MED ORDER — SODIUM CHLORIDE 0.9 % IV SOLN
1.0000 g | Freq: Three times a day (TID) | INTRAVENOUS | Status: DC
  Administered 2017-10-28 – 2017-11-06 (×28): 1 g via INTRAVENOUS
  Filled 2017-10-28 (×30): qty 1

## 2017-10-28 MED ORDER — VITAL HIGH PROTEIN PO LIQD
1000.0000 mL | ORAL | Status: DC
  Administered 2017-10-28 – 2017-11-01 (×5): 1000 mL

## 2017-10-28 MED ORDER — LORAZEPAM 1 MG PO TABS
1.0000 mg | ORAL_TABLET | ORAL | Status: DC | PRN
  Administered 2017-10-28: 1 mg via ORAL
  Filled 2017-10-28: qty 1

## 2017-10-28 MED ORDER — OXYCODONE HCL 5 MG/5ML PO SOLN
5.0000 mg | ORAL | Status: DC | PRN
  Administered 2017-10-28 – 2017-11-06 (×11): 5 mg
  Filled 2017-10-28 (×11): qty 5

## 2017-10-28 NOTE — Progress Notes (Signed)
[place] TEAM 1 - Stepdown/ICU TEAM  Danielle Stevens  FMB:[id number] DOB: May 13, 1970 DOA: 10/26/2017 PCP: Patient, No Pcp Per    Brief Narrative:  47yo F former smoker from [place] with L sided chest pain and acute respiratory failure who was found to have a spontaneous PTX.  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  She was in hospital from 10/14/17 to 10/20/17 with aspiration pneumonia with Enterobacter bacteremia, Lt lung PE on eliquis.  Significant Events: 6/30 Admit, TCTS consulted, fever 101.3 - L inferior (pigtial - per EDP) and anterior (full size - per TCTS) chest tubes 7/01 TCTS s/o 7/2 [facility] assumed care   Subjective: All active issues have been addressed by PCCM today.    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 not transplant candidate per [facility] and [facility] - cont weekly Prolastin - full vent support per PCCM - no plans to attempt vent wean   L spontaneous pneumothorax anterior chest tube placed by TCTS, and inferior pigtail placed by EDP - care of both chest tubes per PCCM   Hx of PE (acute June 2019) continue eliquis for 6 months, then lifelong pharmacologic prophylaxis thereafter  Chronic hypotension continue midodrine  Pseudomonas HCAP  day 3 of ABx - sensitive to Imipenem / intermediate to ceftaz and cefepime  Severe anxiety, RLS scheduled klonopin, paxil, seroquel, requip  Chronic multifactorial normocytic anemia Hgb is stable - no evidence of bleeding on anticoag   DM2 controlled without complication 7/01 X7L 5.1 - CBGwellcontrolled   Diarrhea C diff negative during hospital stay late June   DVT prophylaxis: Eliquis  Code Status: FULL CODE Family Communication: no family present at time of exam  Disposition Plan: ICU on chronic vent   Consultants:   PCCM TCTS  Antimicrobials:  Vancomycin 6/30  Cefepime 6/30  Fortaz 7/01 >  Objective: Blood pressure 95/74, pulse 92, temperature 99 F (37.2 C), resp. rate (!) 24, height 5' 8" (1.727 m), weight 96.4 kg (212 lb 8.4 oz), SpO2 96 %.  Intake/Output Summary (Last 24 hours) at 10/28/2017 1052 Last data filed at 10/28/2017 0500 Gross per 24 hour  Intake 970 ml  Output 722 ml  Net 248 ml   Filed Weights   10/26/17 1500 10/27/17 0430 10/28/17 0500  Weight: 93.4 kg (205 lb 14.6 oz) 93.5 kg (206 lb 2.1 oz) 96.4 kg (212 lb 8.4 oz)    Examination: No exam today - care per PCCM  CBC: Recent Labs  Lab 10/26/17 1056 10/27/17 0524 10/28/17 0508  WBC 40.8* 21.7* 20.6*  HGB 11.2* 9.4* 8.4*  HCT 40.4 33.0* 28.9*  MCV 86.9 83.3 81.9  PLT 495* 263 390   Basic Metabolic Panel: Recent Labs  Lab 10/26/17 1056 10/27/17 0524 10/27/17 1556  NA 142 138  --   K 4.2 3.8  --   CL 96* 93*  --   CO2 32 32  --   GLUCOSE 163* 185*  --   BUN 31* 28*  --   CREATININE 1.02* 0.74  --   CALCIUM 9.8 9.2  --   MG  --  1.7 1.8  PHOS  --  2.9 3.0   GFR: Estimated Creatinine Clearance: 104.4 mL/min (by C-G formula based on SCr of 0.74 mg/dL).  Liver Function Tests: Recent Labs  Lab 10/26/17 1056  AST 54*  ALT 61*  ALKPHOS 92  BILITOT 1.0  PROT 5.9*  ALBUMIN 3.0*    Cardiac Enzymes: Recent Labs  Lab 10/26/17 1056  TROPONINI 0.06*    HbA1C: Hgb A1c MFr Bld  Date/Time Value Ref Range Status  09/12/2017 05:17 AM 5.1 4.8 - 5.6 % Final    Comment:    (NOTE) Pre diabetes:          5.7%-6.4% Diabetes:              >6.4% Glycemic control for   <7.0% adults with diabetes     CBG: Recent Labs  Lab 10/27/17 1629 10/27/17 1932 10/27/17 2306 10/28/17 0302 10/28/17 0753  GLUCAP 149* 153* 157* 197* 178*    Recent Results (from the past 240 hour(s))  Blood culture (routine x 2)     Status: None (Preliminary result)   Collection Time: 10/26/17 10:50 AM  Result Value Ref Range  Status   Specimen Description BLOOD RIGHT ARM  Final   Special Requests   Final    BOTTLES DRAWN AEROBIC AND ANAEROBIC Blood Culture adequate volume   Culture   Final    NO GROWTH 1 DAY Performed at [facility], [place] [address] [place], [place] [address]    Report Status PENDING  Incomplete  MRSA PCR Screening     Status: None   Collection Time: 10/26/17  2:42 PM  Result Value Ref Range Status   MRSA by PCR NEGATIVE NEGATIVE Final    Comment:        The GeneXpert MRSA Assay (FDA approved for NASAL specimens only), is one component of a comprehensive MRSA colonization surveillance program. It is not intended to diagnose MRSA infection nor to guide or monitor treatment for MRSA infections. Performed at [facility], [place] [address], [place], [place] [address]   Culture, respiratory     Status: None   Collection Time: 10/26/17  4:33 PM  Result Value Ref Range Status   Specimen Description TRACHEAL ASPIRATE  Final   Special Requests NONE  Final   Gram Stain   Final    RARE WBC PRESENT,BOTH PMN AND MONONUCLEAR NO SQUAMOUS EPITHELIAL CELLS SEEN RARE GRAM POSITIVE COCCI IN PAIRS RARE GRAM POSITIVE RODS Performed at [facility], [address], [place], [place] [address]    Culture MODERATE PSEUDOMONAS AERUGINOSA  Final   Report Status 10/28/2017 FINAL  Final   Organism ID, Bacteria PSEUDOMONAS AERUGINOSA  Final      Susceptibility   Pseudomonas aeruginosa - MIC*    CEFTAZIDIME 16 INTERMEDIATE Intermediate     CIPROFLOXACIN >=4 RESISTANT Resistant     GENTAMICIN <=1 SENSITIVE Sensitive     IMIPENEM 2 SENSITIVE Sensitive     CEFEPIME INTERMEDIATE Intermediate     * MODERATE PSEUDOMONAS AERUGINOSA     Scheduled Meds: . albuterol  2.5 mg Nebulization Q4H  . apixaban  5 mg Oral BID  . budesonide  0.5 mg Nebulization BID  . chlorhexidine gluconate (MEDLINE KIT)  15 mL Mouth Rinse BID  . clonazePAM  1 mg Per Tube Q8H  . feeding supplement (PRO-STAT SUGAR  FREE 64)  30 mL Per Tube BID  . feeding supplement (VITAL HIGH PROTEIN)  1,000 mL Per Tube Q24H  . free water  200 mL Per Tube Q4H  . insulin aspart  0-9 Units Subcutaneous Q4H  . lidocaine (PF)  30 mL Intradermal Once  . mouth rinse  15 mL Mouth Rinse 10 times per day  . midodrine  5  mg Per Tube TID WC  . pantoprazole sodium  40 mg Per Tube Daily  . PARoxetine  30 mg Per Tube Daily  . polyethylene glycol  17 g Per Tube Daily  . predniSONE  40 mg Per Tube Q breakfast  . QUEtiapine  100 mg Per Tube BID  . rOPINIRole  0.5 mg Per Tube QHS   Continuous Infusions: . sodium chloride    . [START ON 10/31/2017] alpha-1 proteinase inhibitor (PROLASTIN-C) infusion    . cefTAZidime (FORTAZ)  IV 2 g (10/28/17 0511)     LOS: 2 days   Cherene Altes, MD [facility] Office  [phone] Pager - Text Page per Amion as per below:  On-Call/Text Page:      [email or web address]      password [email or web address]  If 7PM-7AM, please contact night-coverage [email or web address] Password [facility] 10/28/2017, 10:52 AM

## 2017-10-28 NOTE — Progress Notes (Signed)
Pharmacy Antibiotic Note  Danielle Stevens is a 47 y.o. female admitted on 10/26/2017 with respiratory distress.  She was recently admitted (6/18-6/24/19) and then discharged to [facility]. Pharmacy has been dosing ceftazidime dosing for pseudomonas PNA but sensitivities revealed that the strain is intermediate. Now switching to meropenem. Tmax/24h 100.6, WBC remains elevated at 20.6. SCr stable 0.63  Vanc 6/30 >>7/1 Cefepime x 1 6/30 Cetaz 7/1>>7/2 Meropenem 7/2>>  6/30 TA -moderate pseudomonas (S to imi; I to cipro, cefepime, ceftaz) 6/30 BCx - ngtd 6/30 mrsa pcr: neg  Plan: Stop ceftazidime Start meropenem 1 gm IV Q 8 hours  Monitor clinical progress, c/s, renal function   Height: 5\' 8"  (172.7 cm) Weight: 212 lb 8.4 oz (96.4 kg) IBW/kg (Calculated) : 63.9  Temp (24hrs), Avg:99.8 F (37.7 C), Min:98.2 F (36.8 C), Max:100.8 F (38.2 C)  Recent Labs  Lab 10/26/17 1056 10/26/17 1059 10/26/17 1512 10/27/17 0524 10/28/17 0508  WBC 40.8*  --   --  21.7* 20.6*  CREATININE 1.02*  --   --  0.74 0.63  LATICACIDVEN  --  2.39* 2.5*  --   --     Estimated Creatinine Clearance: 104.4 mL/min (by C-G formula based on SCr of 0.63 mg/dL).    Allergies  Allergen Reactions  . Wasp Venom Protein Shortness Of Breath  . Adhesive [Tape] Other (See Comments)    Bruises   . Coconut Oil Hives  . Latex Other (See Comments)    Bruises   . Robitussin Severe Multi-Symp [Phenylephrine-Dm-Gg-Apap] Diarrhea and Other (See Comments)    Allergic, per verbal MAR  . Shellfish-Derived Products Hives and Other (See Comments)    Allergic, per verbal MAR   Vinnie Level, PharmD., BCPS Clinical Pharmacist Clinical phone for 10/28/17 until 3:30pm: [phone] If after 3:30pm, please refer to [facility] for unit-specific pharmacist

## 2017-10-28 NOTE — Progress Notes (Signed)
Initial Nutrition Assessment  DOCUMENTATION CODES:   Obesity unspecified  INTERVENTION:    Vital High Protein at 50 ml/h (1200 ml per day)  Provides 1200 kcal, 105 gm protein, 1003 ml free water daily  NUTRITION DIAGNOSIS:   Inadequate oral intake related to inability to eat as evidenced by NPO status.  GOAL:   Provide needs based on ASPEN/SCCM guidelines  MONITOR:   Vent status, TF tolerance, Labs, I & O's  REASON FOR ASSESSMENT:   Ventilator, Consult Enteral/tube feeding initiation and management  ASSESSMENT:   47 yo female with PMH of COPD, trach, PEG, alpha-1-antitrypsin deficiency, HTN, DM who was admitted on 6/30 with a spontaneous PTX.   Recent admission for aspiration and bacteremia 6/18-6/24. Patient receives TF via PEG, nothing PO. She received Vital AF 1.2 at 60 ml/h per facility med list.  Discussed patient in ICU rounds and with RN today. Tolerating TF well. Currently receiving Vital High Protein at 40 ml/h with Pro-stat 30 ml BID. Free water flushes 200 ml every 4 hours.    Patient is currently on ventilator support MV: 13 L/min Temp (24hrs), Avg:99.8 F (37.7 C), Min:98.2 F (36.8 C), Max:100.8 F (38.2 C)   Labs reviewed. CBG's: [phone] Medications reviewed and include novolog, prednisone.  Weight has increased since last admission (186 lbs on 6/19) due to volume overload / edema.    NUTRITION - FOCUSED PHYSICAL EXAM:    Most Recent Value  Orbital Region  No depletion  Upper Arm Region  No depletion  Thoracic and Lumbar Region  No depletion  Buccal Region  No depletion  Temple Region  No depletion  Clavicle Bone Region  No depletion  Clavicle and Acromion Bone Region  No depletion  Scapular Bone Region  Unable to assess  Dorsal Hand  No depletion  Patellar Region  No depletion  Anterior Thigh Region  No depletion  Posterior Calf Region  No depletion  Edema (RD Assessment)  Severe  Hair  Reviewed  Eyes  Reviewed  Mouth   Reviewed  Skin  Reviewed  Nails  Reviewed       Diet Order:   Diet Order           Diet NPO time specified Except for: Ice Chips  Diet effective now          EDUCATION NEEDS:   No education needs have been identified at this time  Skin:  Skin Assessment: Skin Integrity Issues: Skin Integrity Issues:: Other (Comment) Other: MASD to groin, labia, & perineum  Last BM:  7/2 (rectal tube)  Height:   Ht Readings from Last 1 Encounters:  10/28/17 5\' 4"  (1.626 m)    Weight:   Wt Readings from Last 1 Encounters:  10/28/17 212 lb 8.4 oz (96.4 kg)   186 lbs during last admission. Current weight elevated with severe edema / volume overload.   Ideal Body Weight:  54.5 kg  BMI:  Body mass index is 36.48 kg/m.  Estimated Nutritional Needs:   Kcal:  1060-1350  Protein:  109 gm  Fluid:  1.5 L    Joaquin Courts, RD, LDN, CNSC Pager [phone] After Hours Pager [phone]

## 2017-10-28 NOTE — Progress Notes (Signed)
PULMONARY / CRITICAL CARE MEDICINE   Name: Danielle Stevens MRN: [id number] DOB: 1970/11/29    ADMISSION DATE:  10/26/2017  REFERRING MD:  Dr. Lynelle Doctor, ER  CHIEF COMPLAINT:  Short of breath  HISTORY OF PRESENT ILLNESS:   47 yo female former smoker from [facility] with Lt sided chest pain, respiratory failure from spontaneous pneumothorax.  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  She was in hospital from 10/14/17 to 10/20/17 with Aspiration pneumonia with  Enterobacter bacteremia, Lt lung PE on eliquis.  PAST MEDICAL HISTORY :  Anemia, Severe anxiety, Hypotension, DM  SUBJECTIVE:  Wants ice chips.  Has increased stool output.  Denies chest or abdominal pain.  Breathing okay.  VITAL SIGNS: BP 93/64   Pulse 94   Temp 99.5 F (37.5 C)   Resp (!) 26   Ht 5\' 8"  (1.727 m)   Wt 212 lb 8.4 oz (96.4 kg)   SpO2 93%   BMI 32.31 kg/m   VENTILATOR SETTINGS: Vent Mode: PCV FiO2 (%):  [40 %] 40 % Set Rate:  [24 bmp] 24 bmp PEEP:  [8 cmH20] 8 cmH20 Plateau Pressure:  [20 cmH20-32 cmH20] 20 cmH20  INTAKE / OUTPUT: I/O last 3 completed shifts: In: 4606.1 [P.O.:240; Other:420; NG/GT:1240; IV Piggyback:2706.1] Out: 1022 [Urine:900; Stool:1; Chest Tube:121]  PHYSICAL EXAMINATION:  General - pleasant Eyes - pupils reactive ENT - trach site clean Cardiac - regular, no murmur Chest - decreased BS, chest tubes in place, no wheeze Abd - soft, non tender, G tube in place Ext - 1+ edema Skin - no rashes Neuro - follows commands   LABS:  BMET Recent Labs  Lab 10/26/17 1056 10/27/17 0524  NA 142 138  K 4.2 3.8  CL 96* 93*  CO2 32 32  BUN 31* 28*  CREATININE 1.02* 0.74  GLUCOSE 163* 185*    Electrolytes Recent Labs  Lab 10/26/17 1056 10/27/17 0524 10/27/17 1556  CALCIUM 9.8 9.2  --   MG  --  1.7 1.8  PHOS  --  2.9 3.0    CBC Recent Labs  Lab 10/26/17 1056 10/27/17 0524   WBC 40.8* 21.7*  HGB 11.2* 9.4*  HCT 40.4 33.0*  PLT 495* 263    Coag's No results for input(s): APTT, INR in the last 168 hours.  Sepsis Markers Recent Labs  Lab 10/26/17 1059 10/26/17 1512  LATICACIDVEN 2.39* 2.5*    ABG Recent Labs  Lab 10/26/17 0946 10/27/17 0324  PHART 7.219* 7.490*  PCO2ART >91.0* 43.0  PO2ART 61.0* 72.0*    Liver Enzymes Recent Labs  Lab 10/26/17 1056  AST 54*  ALT 61*  ALKPHOS 92  BILITOT 1.0  ALBUMIN 3.0*    Cardiac Enzymes Recent Labs  Lab 10/26/17 1056  TROPONINI 0.06*    Glucose Recent Labs  Lab 10/27/17 1056 10/27/17 1629 10/27/17 1932 10/27/17 2306 10/28/17 0302 10/28/17 0753  GLUCAP 139* 149* 153* 157* 197* 178*    Imaging Dg Chest Port 1 View  Result Date: 10/28/2017 CLINICAL DATA:  Follow-up pneumothorax EXAM: PORTABLE CHEST 1 VIEW COMPARISON:  10/26/2017 FINDINGS: Tracheostomy in 2 left chest tubes remain in place. Small amount of pleural air at the apex seen yesterday is no longer visible. Persistent volume loss/infiltrate in the left lower lobe. Less subcutaneous emphysema. Right chest remains clear. IMPRESSION: No visible pleural air today. Persistent volume loss/infiltrate in the left lower lobe. Less subcutaneous emphysema. Electronically Signed   By: Loraine Leriche  Shogry M.D.   On: 10/28/2017 06:57     STUDIES:   CULTURES: Blood 6/30 >> Sputum 6/30 >> Pseudomonas aeruginosa  ANTIBIOTICS: Vancomycin 6/30 >> 6/30 Cefepime 6/30 >> 6/30 Fortaz 7/01 >>  SIGNIFICANT EVENTS: 6/30 Admit, TCTS consulted, fever 101.3 7/01 TCTS s/o  LINES/TUBES: Trach >>  PEG >> Lt inferior chest tube 6/30 >>  Lt anterior chest tube 6/30 >>  DISCUSSION: 47 yo female with spontaneous Lt PTX with acute on chronic hypoxic/hypercapnic respiratory failure in setting of A1AT with severe COPD/emphysema.  ASSESSMENT / PLAN:  Acute on chronic hypoxic/hypercapnic respiratory failure. A1AT with severe COPD/emphysema. - full vent  support - pulmicort, albuterol, prednisone - prolastin every Thursday - full vent support - pulmicort, albuterol, prednisone - prolastin every Thursday  Lt pneumothorax. - anterior chest tube placed by TCTS >> s/o 7/01 and deferring management to PCCM - will change pig tail (inferior) chest tube to water seal >> if stable on CXR 7/03, will then d/c pig tail chest tube - keep anterior chest tube to suction - f/u CXR  Hx of PE. - continue eliquis  Chronic hypotension. - continue midodrine  HCAP with Pseudomonas in sputum. - day 3 of ABx  Severe anxiety, RLS. - scheduled klonopin, paxil, seroquel, requip  DM type II. - SSI  DVT prophylaxis - eliquis SUP - protonix Nutrition - tube feeds, ice chips Goals of care - full code  Coralyn Helling, MD [facility] Pulmonary/Critical Care 10/28/2017, 8:15 AM

## 2017-10-29 ENCOUNTER — Inpatient Hospital Stay (HOSPITAL_COMMUNITY): Payer: Medicare Other

## 2017-10-29 DIAGNOSIS — J9622 Acute and chronic respiratory failure with hypercapnia: Secondary | ICD-10-CM | POA: Diagnosis not present

## 2017-10-29 DIAGNOSIS — E8801 Alpha-1-antitrypsin deficiency: Secondary | ICD-10-CM | POA: Diagnosis not present

## 2017-10-29 DIAGNOSIS — J441 Chronic obstructive pulmonary disease with (acute) exacerbation: Secondary | ICD-10-CM | POA: Diagnosis not present

## 2017-10-29 DIAGNOSIS — J9621 Acute and chronic respiratory failure with hypoxia: Secondary | ICD-10-CM | POA: Diagnosis not present

## 2017-10-29 DIAGNOSIS — J9311 Primary spontaneous pneumothorax: Secondary | ICD-10-CM | POA: Diagnosis not present

## 2017-10-29 LAB — COMPREHENSIVE METABOLIC PANEL
ALBUMIN: 2.1 g/dL — AB (ref 3.5–5.0)
ALK PHOS: 80 U/L (ref 38–126)
ALT: 46 U/L — AB (ref 0–44)
AST: 34 U/L (ref 15–41)
Anion gap: 11 (ref 5–15)
BILIRUBIN TOTAL: 0.7 mg/dL (ref 0.3–1.2)
BUN: 26 mg/dL — AB (ref 6–20)
CO2: 32 mmol/L (ref 22–32)
CREATININE: 0.56 mg/dL (ref 0.44–1.00)
Calcium: 8.9 mg/dL (ref 8.9–10.3)
Chloride: 93 mmol/L — ABNORMAL LOW (ref 98–111)
GFR calc Af Amer: >60 mL/min (ref >60)
GLUCOSE: 152 mg/dL — AB (ref 70–99)
POTASSIUM: 3.2 mmol/L — AB (ref 3.5–5.1)
Sodium: 136 mmol/L (ref 135–145)
Total Protein: 4.9 g/dL — ABNORMAL LOW (ref 6.5–8.1)

## 2017-10-29 LAB — CBC
HEMATOCRIT: 30 % — AB (ref 36.0–46.0)
Hemoglobin: 8.8 g/dL — ABNORMAL LOW (ref 12.0–15.0)
MCH: 23.6 pg — ABNORMAL LOW (ref 26.0–34.0)
MCHC: 29.3 g/dL — AB (ref 30.0–36.0)
MCV: 80.4 fL (ref 78.0–100.0)
PLATELETS: 276 10*3/uL (ref 150–400)
RBC: 3.73 MIL/uL — ABNORMAL LOW (ref 3.87–5.11)
RDW: 17 % — AB (ref 11.5–15.5)
WBC: 17.3 10*3/uL — AB (ref 4.0–10.5)

## 2017-10-29 LAB — GLUCOSE, CAPILLARY
GLUCOSE-CAPILLARY: 122 mg/dL — AB (ref 70–99)
GLUCOSE-CAPILLARY: 116 mg/dL — AB (ref 70–99)
GLUCOSE-CAPILLARY: 117 mg/dL — AB (ref 70–99)
Glucose-Capillary: 121 mg/dL — ABNORMAL HIGH (ref 70–99)
Glucose-Capillary: 123 mg/dL — ABNORMAL HIGH (ref 70–99)
Glucose-Capillary: 186 mg/dL — ABNORMAL HIGH (ref 70–99)

## 2017-10-29 MED ORDER — FAMOTIDINE IN NACL 20-0.9 MG/50ML-% IV SOLN
20.0000 mg | Freq: Two times a day (BID) | INTRAVENOUS | Status: DC | PRN
  Administered 2017-10-29 – 2017-10-30 (×2): 20 mg via INTRAVENOUS
  Filled 2017-10-29 (×2): qty 50

## 2017-10-29 MED ORDER — ONDANSETRON HCL 4 MG/2ML IJ SOLN
4.0000 mg | Freq: Four times a day (QID) | INTRAMUSCULAR | Status: DC | PRN
  Administered 2017-10-29 – 2017-11-03 (×2): 4 mg via INTRAVENOUS
  Filled 2017-10-29 (×2): qty 2

## 2017-10-29 MED ORDER — POTASSIUM CHLORIDE 20 MEQ/15ML (10%) PO SOLN
40.0000 meq | Freq: Once | ORAL | Status: AC
  Administered 2017-10-29: 40 meq
  Filled 2017-10-29: qty 30

## 2017-10-29 MED ORDER — PREDNISONE 20 MG PO TABS
30.0000 mg | ORAL_TABLET | Freq: Every day | ORAL | Status: DC
  Administered 2017-10-30 – 2017-11-01 (×3): 30 mg
  Filled 2017-10-29 (×3): qty 1

## 2017-10-29 NOTE — Progress Notes (Signed)
[facility] notified of K+ 3.2. 40 meq OTO of K+ ordered. Will give through Gtube. Will continue to monitor.

## 2017-10-29 NOTE — Plan of Care (Signed)
  Problem: Education: Goal: Knowledge of General Education information will improve Outcome: Progressing   Problem: Health Behavior/Discharge Planning: Goal: Ability to manage health-related needs will improve Outcome: Progressing   Problem: Clinical Measurements: Goal: Ability to maintain clinical measurements within normal limits will improve Outcome: Progressing Goal: Will remain free from infection Outcome: Progressing Goal: Diagnostic test results will improve Outcome: Progressing Goal: Respiratory complications will improve Outcome: Progressing Goal: Cardiovascular complication will be avoided Outcome: Progressing   Problem: Activity: Goal: Risk for activity intolerance will decrease Outcome: Progressing   Problem: Nutrition: Goal: Adequate nutrition will be maintained Outcome: Progressing   Problem: Coping: Goal: Level of anxiety will decrease Outcome: Progressing   Problem: Elimination: Goal: Will not experience complications related to bowel motility Outcome: Progressing Goal: Will not experience complications related to urinary retention Outcome: Progressing   Problem: Pain Managment: Goal: General experience of comfort will improve Outcome: Progressing   Problem: Safety: Goal: Ability to remain free from injury will improve Outcome: Progressing   Problem: Skin Integrity: Goal: Risk for impaired skin integrity will decrease Outcome: Progressing   Problem: Infection Goal: Understanding of disease or condition will improve Outcome: Progressing Goal: Ability to demonstrate management of disease process will improve Outcome: Progressing

## 2017-10-29 NOTE — Progress Notes (Signed)
PULMONARY / CRITICAL CARE MEDICINE   Name: Danielle Stevens MRN: [id number] DOB: 10-26-1970    ADMISSION DATE:  10/26/2017  REFERRING MD:  Dr. Lynelle Doctor, ER  CHIEF COMPLAINT:  Short of breath  HISTORY OF PRESENT ILLNESS:   47 yo female former smoker from [facility] with Lt sided chest pain, respiratory failure from spontaneous pneumothorax.  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  She was in hospital from 10/14/17 to 10/20/17 with Aspiration pneumonia with  Enterobacter bacteremia, Lt lung PE on eliquis.  PAST MEDICAL HISTORY :  Anemia, Severe anxiety, Hypotension, DM  SUBJECTIVE:  Denies chest pain.  Feels sleepy.  Breathing okay.  VITAL SIGNS: BP (!) 81/56   Pulse 83   Temp 98.2 F (36.8 C) (Oral)   Resp (!) 24   Ht 5\' 4"  (1.626 m)   Wt 218 lb 0.6 oz (98.9 kg)   SpO2 93%   BMI 37.43 kg/m   VENTILATOR SETTINGS: Vent Mode: PCV FiO2 (%):  [40 %] 40 % Set Rate:  [24 bmp] 24 bmp PEEP:  [8 cmH20] 8 cmH20 Plateau Pressure:  [21 cmH20-30 cmH20] 25 cmH20  INTAKE / OUTPUT: I/O last 3 completed shifts: In: 2944.2 [I.V.:150; Other:90; VH/QI:6962.9; IV Piggyback:400] Out: 5284 [XLKGM:0102; Stool:300; Chest Tube:140]  PHYSICAL EXAMINATION:  General - pleasant Eyes - pupils reactive ENT - trach site clean Cardiac - regular, no murmur Chest - decreased BS, Lt chest tubes in place Abd - soft, non tender Ext - 1+ edema Skin - no rashes Neuro - follows commands  LABS:  BMET Recent Labs  Lab 10/27/17 0524 10/28/17 0508 10/29/17 0303  NA 138 135 136  K 3.8 3.6 3.2*  CL 93* 94* 93*  CO2 32 32 32  BUN 28* 32* 26*  CREATININE 0.74 0.63 0.56  GLUCOSE 185* 162* 152*    Electrolytes Recent Labs  Lab 10/27/17 0524 10/27/17 1556 10/28/17 0508 10/29/17 0303  CALCIUM 9.2  --  8.9 8.9  MG 1.7 1.8 1.8  --   PHOS 2.9 3.0 2.9  --     CBC Recent Labs  Lab 10/27/17 0524  10/28/17 0508 10/29/17 0303  WBC 21.7* 20.6* 17.3*  HGB 9.4* 8.4* 8.8*  HCT 33.0* 28.9* 30.0*  PLT 263 259 276    Coag's No results for input(s): APTT, INR in the last 168 hours.  Sepsis Markers Recent Labs  Lab 10/26/17 1059 10/26/17 1512  LATICACIDVEN 2.39* 2.5*    ABG Recent Labs  Lab 10/26/17 0946 10/27/17 0324  PHART 7.219* 7.490*  PCO2ART >91.0* 43.0  PO2ART 61.0* 72.0*    Liver Enzymes Recent Labs  Lab 10/26/17 1056 10/29/17 0303  AST 54* 34  ALT 61* 46*  ALKPHOS 92 80  BILITOT 1.0 0.7  ALBUMIN 3.0* 2.1*    Cardiac Enzymes Recent Labs  Lab 10/26/17 1056  TROPONINI 0.06*    Glucose Recent Labs  Lab 10/28/17 1155 10/28/17 1533 10/28/17 2032 10/28/17 2319 10/29/17 0323 10/29/17 0738  GLUCAP 161* 170* 147* 149* 186* 121*    Imaging Dg Chest Port 1 View  Result Date: 10/29/2017 CLINICAL DATA:  Pneumothorax EXAM: PORTABLE CHEST 1 VIEW COMPARISON:  Yesterday FINDINGS: Two chest tubes on the left. Haziness of the left chest of favoring atelectasis and pleural fluid. No visible pneumothorax. Hyperinflated, lucent right lung. Tracheostomy tube in place. Normal heart size IMPRESSION: Stable tube positioning with no visible pneumothorax. Hazy opacity on the left that is likely atelectasis  and pleural fluid. Electronically Signed   By: Marnee Spring M.D.   On: 10/29/2017 07:22     STUDIES:   CULTURES: Blood 6/30 >> Sputum 6/30 >> Pseudomonas aeruginosa  ANTIBIOTICS: Vancomycin 6/30 >> 6/30 Cefepime 6/30 >> 6/30 Fortaz 7/01 >>  SIGNIFICANT EVENTS: 6/30 Admit, TCTS consulted, fever 101.3 7/01 TCTS s/o  LINES/TUBES: Trach >>  PEG >> Lt inferior chest tube 6/30 >> 7/03 Lt anterior chest tube 6/30 >>  DISCUSSION: 47 yo female with spontaneous Lt PTX with acute on chronic hypoxic/hypercapnic respiratory failure in setting of A1AT with severe COPD/emphysema.  ASSESSMENT / PLAN:  Acute on chronic hypoxic/hypercapnic respiratory  failure. A1AT with severe COPD/emphysema. - full vent support >> change PC to 30 and PEEP to 5 - continue pulmicort, albuterol - change prednisone to 30 mg daily - prolastin every Thursday  Lt pneumothorax. - anterior chest tube placed by TCTS >> s/o 7/01 and deferring management to PCCM - keep anterior chest tube to suction - d/c inferior (pig tail) chest tube 7/03 - f/u CXR  Hx of PE. - continue eliquis  Chronic hypotension. - continue midodrine  HCAP with Pseudomonas in sputum. - day 4 of ABx  Severe anxiety, RLS. - scheduled klonopin, paxil, seroquel, requip  DM type II. - SSI  DVT prophylaxis - eliquis SUP - protonix Nutrition - tube feeds, ice chips Goals of care - full code  Coralyn Helling, MD [facility] Pulmonary/Critical Care 10/29/2017, 8:15 AM

## 2017-10-29 NOTE — Procedures (Signed)
Left pig tail chest tube removed. CxR ordered. Dressing applied. Brett Canales Levoy Geisen ACNP [facility] PCCM Pager [phone] till 1 pm If no answer page [phone] 10/29/2017, 9:37 AM

## 2017-10-29 NOTE — Plan of Care (Signed)
  Problem: Elimination: Goal: Will not experience complications related to urinary retention Outcome: Progressing   Problem: Pain Managment: Goal: General experience of comfort will improve Outcome: Not Progressing   Problem: Safety: Goal: Ability to remain free from injury will improve Outcome: Progressing   Problem: Skin Integrity: Goal: Risk for impaired skin integrity will decrease Outcome: Not Progressing   Problem: Infection Goal: Understanding of disease or condition will improve Outcome: Progressing

## 2017-10-30 ENCOUNTER — Inpatient Hospital Stay (HOSPITAL_COMMUNITY): Payer: Medicare Other

## 2017-10-30 DIAGNOSIS — J9612 Chronic respiratory failure with hypercapnia: Secondary | ICD-10-CM

## 2017-10-30 DIAGNOSIS — E876 Hypokalemia: Secondary | ICD-10-CM

## 2017-10-30 DIAGNOSIS — Z930 Tracheostomy status: Secondary | ICD-10-CM | POA: Diagnosis not present

## 2017-10-30 DIAGNOSIS — J9611 Chronic respiratory failure with hypoxia: Secondary | ICD-10-CM

## 2017-10-30 DIAGNOSIS — J9311 Primary spontaneous pneumothorax: Secondary | ICD-10-CM | POA: Diagnosis not present

## 2017-10-30 LAB — BASIC METABOLIC PANEL
Anion gap: 10 (ref 5–15)
BUN: 18 mg/dL (ref 6–20)
CALCIUM: 8.3 mg/dL — AB (ref 8.9–10.3)
CHLORIDE: 95 mmol/L — AB (ref 98–111)
CO2: 32 mmol/L (ref 22–32)
CREATININE: 0.44 mg/dL (ref 0.44–1.00)
GFR calc non Af Amer: >60 mL/min (ref >60)
Glucose, Bld: 175 mg/dL — ABNORMAL HIGH (ref 70–99)
Potassium: 3.6 mmol/L (ref 3.5–5.1)
SODIUM: 137 mmol/L (ref 135–145)

## 2017-10-30 LAB — CBC
HEMATOCRIT: 32.4 % — AB (ref 36.0–46.0)
Hemoglobin: 9.2 g/dL — ABNORMAL LOW (ref 12.0–15.0)
MCH: 23.6 pg — ABNORMAL LOW (ref 26.0–34.0)
MCHC: 28.4 g/dL — AB (ref 30.0–36.0)
MCV: 83.1 fL (ref 78.0–100.0)
PLATELETS: 244 10*3/uL (ref 150–400)
RBC: 3.9 MIL/uL (ref 3.87–5.11)
RDW: 17.4 % — AB (ref 11.5–15.5)
WBC: 19.3 10*3/uL — AB (ref 4.0–10.5)

## 2017-10-30 LAB — GLUCOSE, CAPILLARY
GLUCOSE-CAPILLARY: 126 mg/dL — AB (ref 70–99)
GLUCOSE-CAPILLARY: 152 mg/dL — AB (ref 70–99)
GLUCOSE-CAPILLARY: 176 mg/dL — AB (ref 70–99)
GLUCOSE-CAPILLARY: 195 mg/dL — AB (ref 70–99)
GLUCOSE-CAPILLARY: 174 mg/dL — AB (ref 70–99)

## 2017-10-30 LAB — MAGNESIUM: MAGNESIUM: 1.7 mg/dL (ref 1.7–2.4)

## 2017-10-30 MED ORDER — POTASSIUM CHLORIDE 20 MEQ/15ML (10%) PO SOLN
40.0000 meq | Freq: Three times a day (TID) | ORAL | Status: AC
  Administered 2017-10-30 (×2): 40 meq
  Filled 2017-10-30 (×3): qty 30

## 2017-10-30 NOTE — Progress Notes (Signed)
PULMONARY / CRITICAL CARE MEDICINE   Name: Danielle Stevens MRN: [id number] DOB: 27-Apr-1971    ADMISSION DATE:  10/26/2017  REFERRING MD:  Dr. Lynelle Doctor, ER  CHIEF COMPLAINT:  Short of breath  HISTORY OF PRESENT ILLNESS:   47 yo female former smoker from [facility] with Lt sided chest pain, respiratory failure from spontaneous pneumothorax.  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  She was in hospital from 10/14/17 to 10/20/17 with Aspiration pneumonia with  Enterobacter bacteremia, Lt lung PE on eliquis.  SUBJECTIVE:  No events overnight, no air leak  VITAL SIGNS: BP 99/71   Pulse (!) 116   Temp 99.6 F (37.6 C) (Oral)   Resp 11   Ht 5\' 4"  (1.626 m)   Wt 210 lb 1.6 oz (95.3 kg)   SpO2 (!) 89%   BMI 36.06 kg/m   VENTILATOR SETTINGS: Vent Mode: PCV FiO2 (%):  [40 %] 40 % Set Rate:  [24 bmp] 24 bmp PEEP:  [5 cmH20] 5 cmH20 Plateau Pressure:  [18 cmH20-26 cmH20] 26 cmH20  INTAKE / OUTPUT: I/O last 3 completed shifts: In: 5666.4 [I.V.:182.9; Other:180; NG/GT:3150; IV Piggyback:2153.5] Out: 2555 [Urine:1975; Stool:400; Chest Tube:180]  PHYSICAL EXAMINATION:  General - Pleasant, alert and interactive Eyes - PERRL, EOM-I ENT - Trach site clean Cardiac - RRR, Nl S1/S2 and -M/R/G. Chest - Coarse BS diffusely Abd - Soft, NT, ND and +BS Ext - 1+ edema Skin - Intact  Neuro - follows commands  LABS:  BMET Recent Labs  Lab 10/27/17 0524 10/28/17 0508 10/29/17 0303  NA 138 135 136  K 3.8 3.6 3.2*  CL 93* 94* 93*  CO2 32 32 32  BUN 28* 32* 26*  CREATININE 0.74 0.63 0.56  GLUCOSE 185* 162* 152*   Electrolytes Recent Labs  Lab 10/27/17 0524 10/27/17 1556 10/28/17 0508 10/29/17 0303  CALCIUM 9.2  --  8.9 8.9  MG 1.7 1.8 1.8  --   PHOS 2.9 3.0 2.9  --    CBC Recent Labs  Lab 10/27/17 0524 10/28/17 0508 10/29/17 0303  WBC 21.7* 20.6* 17.3*  HGB 9.4* 8.4* 8.8*  HCT  33.0* 28.9* 30.0*  PLT 263 259 276   Coag's No results for input(s): APTT, INR in the last 168 hours.  Sepsis Markers Recent Labs  Lab 10/26/17 1059 10/26/17 1512  LATICACIDVEN 2.39* 2.5*   ABG Recent Labs  Lab 10/26/17 0946 10/27/17 0324  PHART 7.219* 7.490*  PCO2ART >91.0* 43.0  PO2ART 61.0* 72.0*   Liver Enzymes Recent Labs  Lab 10/26/17 1056 10/29/17 0303  AST 54* 34  ALT 61* 46*  ALKPHOS 92 80  BILITOT 1.0 0.7  ALBUMIN 3.0* 2.1*   Cardiac Enzymes Recent Labs  Lab 10/26/17 1056  TROPONINI 0.06*   Glucose Recent Labs  Lab 10/29/17 1201 10/29/17 1615 10/29/17 1927 10/29/17 2342 10/30/17 0400 10/30/17 0727  GLUCAP 122* 116* 117* 123* 126* 152*   Imaging Dg Chest Port 1 View  Result Date: 10/30/2017 CLINICAL DATA:  Followup pneumothorax EXAM: PORTABLE CHEST 1 VIEW COMPARISON:  10/29/2017 FINDINGS: Tracheostomy well positioned. Left chest tube remains in place. No visible pleural air on this exam. Persistent atelectasis of the left lower lobe. Right chest is clear. IMPRESSION: No pneumothorax visible today. Persistent atelectasis of the left lower lobe. Electronically Signed   By: Paulina Fusi M.D.   On: 10/30/2017 06:59   Dg Chest Port 1 View  Result Date: 10/29/2017 CLINICAL DATA:  Chest tube removal. EXAM: PORTABLE CHEST 1 VIEW COMPARISON:  Radiograph October 29, 2017. FINDINGS: Stable cardiomediastinal silhouette. Tracheostomy tube is unchanged in position. Right lung is clear. Pigtail drainage catheter seen in left lower lobe has been removed. Large bore left-sided chest tube remains with tip in left lung apex. Minimal left apical pneumothorax is noted. Stable left basilar opacity is noted concerning for atelectasis or infiltrate with associated pleural effusion. Bony thorax is unremarkable. IMPRESSION: Pigtail drainage catheter noted in left lower lobe on prior exam has been removed. Stable position of left apical chest tube is noted. Minimal left apical  pneumothorax is noted. Electronically Signed   By: Lupita Raider, M.D.   On: 10/29/2017 10:14   STUDIES:   CULTURES: Blood 6/30 >> Sputum 6/30 >> Pseudomonas aeruginosa  ANTIBIOTICS: Vancomycin 6/30 >> 6/30 Cefepime 6/30 >> 6/30 Fortaz 7/01 >>  SIGNIFICANT EVENTS: 6/30 Admit, TCTS consulted, fever 101.3 7/01 TCTS s/o  LINES/TUBES: Trach >>  PEG >> Lt inferior chest tube 6/30 >> 7/03 Lt anterior chest tube 6/30 >>  I reviewed CXR myself, trach is in good position, CT is in good position  DISCUSSION: 47 yo female with spontaneous Lt PTX with acute on chronic hypoxic/hypercapnic respiratory failure in setting of A1AT with severe COPD/emphysema.  Discussed with PCCM-NP  ASSESSMENT / PLAN:  Acute on chronic hypoxic/hypercapnic respiratory failure. A1AT with severe COPD/emphysema. - Full vent support - Continue pulmicort, albuterol - Prednisone to 30 mg daily - Prolastin every Thursday  Lt pneumothorax. - Anterior chest tube placed by TCTS >> s/o 7/01 and deferring management to PCCM - Change CT to water seal - CXR in AM - D/ced inferior (pig tail) chest tube 7/03  Hx of PE. - Continue eliquis  Hypokalemia: - KCl PO - BMET in AM  Chronic hypotension. - Midodrine for BP support  HCAP with Pseudomonas in sputum. - Day 5 of ABx  Severe anxiety, RLS. - Scheduled klonopin, paxil, seroquel, requip  DM type II. - SSI  DVT prophylaxis - eliquis SUP - protonix Nutrition - tube feeds, ice chips Goals of care - full code  Alyson Reedy, M.D. [facility] Pulmonary/Critical Care Medicine. Pager: [phone]. After hours pager: [phone].  10/30/2017, 8:53 AM

## 2017-10-30 NOTE — Plan of Care (Signed)
  Problem: Coping: Goal: Level of anxiety will decrease Outcome: Progressing Note:  Patient continues to have increased anxiety- asking to be frequently suctioned to the point of bloody secretions- required ativan every 4 hours and pain medication to allow patient to rest.    Problem: Pain Managment: Goal: General experience of comfort will improve Outcome: Progressing Note:  Patient c/o pain at area of chest tube- controlled with available medication.

## 2017-10-31 ENCOUNTER — Inpatient Hospital Stay (HOSPITAL_COMMUNITY): Payer: Medicare Other

## 2017-10-31 DIAGNOSIS — J9311 Primary spontaneous pneumothorax: Secondary | ICD-10-CM | POA: Diagnosis not present

## 2017-10-31 DIAGNOSIS — Z930 Tracheostomy status: Secondary | ICD-10-CM | POA: Diagnosis not present

## 2017-10-31 DIAGNOSIS — E8801 Alpha-1-antitrypsin deficiency: Secondary | ICD-10-CM | POA: Diagnosis not present

## 2017-10-31 DIAGNOSIS — Z9911 Dependence on respirator [ventilator] status: Secondary | ICD-10-CM | POA: Diagnosis not present

## 2017-10-31 LAB — CBC
HCT: 31.4 % — ABNORMAL LOW (ref 36.0–46.0)
HEMOGLOBIN: 8.8 g/dL — AB (ref 12.0–15.0)
MCH: 23.3 pg — ABNORMAL LOW (ref 26.0–34.0)
MCHC: 28 g/dL — ABNORMAL LOW (ref 30.0–36.0)
MCV: 83.1 fL (ref 78.0–100.0)
PLATELETS: 226 10*3/uL (ref 150–400)
RBC: 3.78 MIL/uL — AB (ref 3.87–5.11)
RDW: 17.1 % — ABNORMAL HIGH (ref 11.5–15.5)
WBC: 19.3 10*3/uL — ABNORMAL HIGH (ref 4.0–10.5)

## 2017-10-31 LAB — BASIC METABOLIC PANEL
Anion gap: 10 (ref 5–15)
BUN: 16 mg/dL (ref 6–20)
CHLORIDE: 95 mmol/L — AB (ref 98–111)
CO2: 32 mmol/L (ref 22–32)
CREATININE: 0.37 mg/dL — AB (ref 0.44–1.00)
Calcium: 8.5 mg/dL — ABNORMAL LOW (ref 8.9–10.3)
Glucose, Bld: 126 mg/dL — ABNORMAL HIGH (ref 70–99)
POTASSIUM: 4 mmol/L (ref 3.5–5.1)
SODIUM: 137 mmol/L (ref 135–145)

## 2017-10-31 LAB — GLUCOSE, CAPILLARY
GLUCOSE-CAPILLARY: 115 mg/dL — AB (ref 70–99)
GLUCOSE-CAPILLARY: 138 mg/dL — AB (ref 70–99)
Glucose-Capillary: 102 mg/dL — ABNORMAL HIGH (ref 70–99)
Glucose-Capillary: 134 mg/dL — ABNORMAL HIGH (ref 70–99)
Glucose-Capillary: 148 mg/dL — ABNORMAL HIGH (ref 70–99)
Glucose-Capillary: 165 mg/dL — ABNORMAL HIGH (ref 70–99)
Glucose-Capillary: 180 mg/dL — ABNORMAL HIGH (ref 70–99)

## 2017-10-31 LAB — CULTURE, BLOOD (ROUTINE X 2)
CULTURE: NO GROWTH
Special Requests: ADEQUATE

## 2017-10-31 LAB — MAGNESIUM: MAGNESIUM: 1.8 mg/dL (ref 1.7–2.4)

## 2017-10-31 LAB — PHOSPHORUS: PHOSPHORUS: 3.1 mg/dL (ref 2.5–4.6)

## 2017-10-31 MED ORDER — FAMOTIDINE 40 MG/5ML PO SUSR
20.0000 mg | Freq: Two times a day (BID) | ORAL | Status: DC
  Administered 2017-10-31 – 2017-11-06 (×13): 20 mg
  Filled 2017-10-31 (×13): qty 2.5

## 2017-10-31 NOTE — Progress Notes (Signed)
Physical Therapy Treatment Patient Details Name: Danielle Stevens MRN: [id number] DOB: Jan 14, 1971 Today's Date: 10/31/2017    History of Present Illness This 47 y.o. female admitted from [facility] (where she has been for several months ) for Lt sided chest pain, Respiratory failure due to spontaneous pneumothorax.  She unerwent placement of chest tube.  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  I really wanted to get her EOB (as notes before said before admission she was transferring to the chair at [facility]), however, she adamately refused this option.  With the RN's encouragement, she did sit up with the bed in chair mode and practiced lifting her trunk off of the bed x 3 and preformed seated kicks.  I left her in a more upright position and the end of the session and RN agreed to have her in chair mode again 2 more times today practicing getting her chest off of the back of the bed.  VSS throughout.     Follow Up Recommendations  LTACH(from [facility])     Equipment Recommendations  None recommended by PT    Recommendations for Other Services   NA     Precautions / Restrictions Precautions Precautions: Fall Precaution Comments: trach, PEG, L chest tube Restrictions Weight Bearing Restrictions: No    Mobility  Bed Mobility Overal bed mobility: Needs Assistance             General bed mobility comments: Log sit with trunk off of bed with bed in egress with mod assist and pt pulling with bil UEs on bed rails. Preformed x 3 as pt refused full EOB.          Balance Overall balance assessment: Needs assistance Sitting-balance support: Feet unsupported;Bilateral upper extremity supported Sitting balance-Leahy Scale: Poor Sitting balance - Comments: mod assist to  maintain trunk off of back of bed with bed in egress x 3 with bil UE pulling on bed rails x 10 seconds each time.   Postural control: Posterior lean                                  Cognition Arousal/Alertness: Awake/alert Behavior During Therapy: Anxious Overall Cognitive Status: Within Functional Limits for tasks assessed                                 General Comments: Not specifically tested, mostly anxious      Exercises General Exercises - Lower Extremity Ankle Circles/Pumps: AROM;Both;20 reps Short Arc Quad: AROM;Both;10 reps Long Arc Quad: AROM;Both;10 reps(while bed was in egress) Heel Slides: AAROM;Both;20 reps Hip ABduction/ADduction: AAROM;Both;10 reps    General Comments General comments (skin integrity, edema, etc.): Pt's VSS throughout session on vent.  RN did not deep suction as her sats were good and there were no audible mucus.  RN and PT educating pt on being more upright with plans to bed egress with RN two more times today.        Pertinent Vitals/Pain Pain Assessment: Faces Faces Pain Scale: Hurts whole lot Pain Location: Lt chest  Pain Descriptors / Indicators: Grimacing;Guarding Pain Intervention(s): Limited activity within patient's tolerance;Monitored during session;Repositioned  PT Goals (current goals can now be found in the care plan section) Acute Rehab PT Goals Patient Stated Goal: breathe better Progress towards PT goals: Progressing toward goals    Frequency    Min 2X/week      PT Plan Current plan remains appropriate       AM-PAC PT "6 Clicks" Daily Activity  Outcome Measure  Difficulty turning over in bed (including adjusting bedclothes, sheets and blankets)?: Unable Difficulty moving from lying on back to sitting on the side of the bed? : Unable Difficulty sitting down on and standing up from a chair with arms (e.g., wheelchair, bedside commode, etc,.)?: Unable Help needed moving to and  from a bed to chair (including a wheelchair)?: Total Help needed walking in hospital room?: Total Help needed climbing 3-5 steps with a railing? : Total 6 Click Score: 6    End of Session Equipment Utilized During Treatment: Oxygen;Other (comment)(vent FiO2 40% PEEP 5) Activity Tolerance: Patient tolerated treatment well;Other (comment)(self limiting by anxiety) Patient left: in chair;Other (comment);in bed(in chair mode in bed with head up high)   PT Visit Diagnosis: Other abnormalities of gait and mobility (R26.89);Muscle weakness (generalized) (M62.81);Pain Pain - Right/Left: Left Pain - part of body: (chest tube site)     Time: 4383-8184 PT Time Calculation (min) (ACUTE ONLY): 28 min  Charges:  $Therapeutic Exercise: 8-22 mins $Therapeutic Activity: 8-22 mins          Sharod Petsch B. Miqueas Whilden, PT, DPT [phone]            10/31/2017, 1:02 PM

## 2017-10-31 NOTE — Progress Notes (Signed)
[facility] TEAM 1 - Stepdown/ICU TEAM  Danielle Stevens  QVZ:[id number] DOB: 12/08/1970 DOA: 10/26/2017 PCP: Patient, No Pcp Per    Brief Narrative:  47yo F former smoker from [place] with L sided chest pain and acute respiratory failure who was found to have a spontaneous PTX.  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  She was in hospital from 10/14/17 to 10/20/17 with aspiration pneumonia with Enterobacter bacteremia, Lt lung PE on eliquis.  Significant Events: 6/30 Admit, TCTS consulted, fever 101.3 - L inferior (pigtial - per EDP) and anterior (full size - per TCTS) chest tubes 7/01 TCTS s/o 7/2 [facility] assumed care  7/5 anterior chest tube removed   Subjective: All active issues have been addressed by PCCM today.  Pt remains on [facility] service and I will be providing her care on 11/01/17.    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 not transplant candidate per [facility] and [facility] - cont weekly Prolastin as pt is now willing to consider stopping it - full vent support per PCCM - no plans to attempt vent wean   L spontaneous pneumothorax anterior chest tube placed by TCTS, and inferior pigtail placed by EDP - all chest tubes now out per PCCM   Hx of PE (acute June 2019) continue eliquis for 6 months, then lifelong pharmacologic prophylaxis thereafter  Chronic hypotension continue midodrine  Pseudomonas HCAP  Cont merem  Severe anxiety, RLS scheduled klonopin, paxil, seroquel, requip  Chronic multifactorial normocytic anemia Hgb is stable - no evidence of bleeding on anticoag   DM2 controlled without complication 3/29 J1O 5.1 - CBGwellcontrolled   Diarrhea C diff negative during hospital stay late June   DVT prophylaxis: Eliquis  Code Status: FULL CODE Family Communication:    Disposition Plan: ICU on chronic vent   Consultants:  PCCM TCTS  Antimicrobials:  Vancomycin 6/30  Cefepime 6/30  Fortaz 7/01 >  Objective: Blood pressure 100/63, pulse 96, temperature 97.6 F (36.4 C), temperature source Oral, resp. rate (!) 24, height '5\' 4"'$  (1.626 m), weight 96.4 kg (212 lb 8.4 oz), SpO2 97 %.  Intake/Output Summary (Last 24 hours) at 10/31/2017 1653 Last data filed at 10/31/2017 1200 Gross per 24 hour  Intake 2026.02 ml  Output 1080 ml  Net 946.02 ml   Filed Weights   10/29/17 0100 10/30/17 0500 10/31/17 0330  Weight: 98.9 kg (218 lb 0.6 oz) 95.3 kg (210 lb 1.6 oz) 96.4 kg (212 lb 8.4 oz)    Examination: No exam today - care per PCCM  CBC: Recent Labs  Lab 10/29/17 0303 10/30/17 0906 10/31/17 0336  WBC 17.3* 19.3* 19.3*  HGB 8.8* 9.2* 8.8*  HCT 30.0* 32.4* 31.4*  MCV 80.4 83.1 83.1  PLT 276 244 841   Basic Metabolic Panel: Recent Labs  Lab 10/27/17 1556 10/28/17 0508 10/29/17 0303 10/30/17 0906 10/31/17 0336  NA  --  135 136 137 137  K  --  3.6 3.2* 3.6 4.0  CL  --  94* 93* 95* 95*  CO2  --  32 32 32 32  GLUCOSE  --  162* 152* 175* 126*  BUN  --  32* 26* 18 16  CREATININE  --  0.63 0.56 0.44 0.37*  CALCIUM  --  8.9 8.9 8.3* 8.5*  MG 1.8 1.8  --  1.7 1.8  PHOS 3.0 2.9  --   --  3.1   GFR: Estimated Creatinine Clearance: 96.9 mL/min (A) (by C-G formula based on SCr of 0.37 mg/dL (L)).  Liver Function Tests: Recent Labs  Lab 10/26/17 1056 10/29/17 0303  AST 54* 34  ALT 61* 46*  ALKPHOS 92 80  BILITOT 1.0 0.7  PROT 5.9* 4.9*  ALBUMIN 3.0* 2.1*    Cardiac Enzymes: Recent Labs  Lab 10/26/17 1056  TROPONINI 0.06*    HbA1C: Hgb A1c MFr Bld  Date/Time Value Ref Range Status  09/12/2017 05:17 AM 5.1 4.8 - 5.6 % Final    Comment:    (NOTE) Pre diabetes:          5.7%-6.4% Diabetes:              >6.4% Glycemic control for   <7.0% adults with diabetes     CBG: Recent Labs  Lab 10/31/17 0006 10/31/17 0333  10/31/17 0739 10/31/17 1126 10/31/17 1613  GLUCAP 148* 115* 138* 180* 165*    Recent Results (from the past 240 hour(s))  Blood culture (routine x 2)     Status: None   Collection Time: 10/26/17 10:50 AM  Result Value Ref Range Status   Specimen Description BLOOD RIGHT ARM  Final   Special Requests   Final    BOTTLES DRAWN AEROBIC AND ANAEROBIC Blood Culture adequate volume   Culture   Final    NO GROWTH 5 DAYS Performed at [facility], [place] [address], [place], [place] [address]    Report Status 10/31/2017 FINAL  Final  MRSA PCR Screening     Status: None   Collection Time: 10/26/17  2:42 PM  Result Value Ref Range Status   MRSA by PCR NEGATIVE NEGATIVE Final    Comment:        The GeneXpert MRSA Assay (FDA approved for NASAL specimens only), is one component of a comprehensive MRSA colonization surveillance program. It is not intended to diagnose MRSA infection nor to guide or monitor treatment for MRSA infections. Performed at [facility], [place] [address], [place], [place] [address]   Culture, respiratory     Status: None   Collection Time: 10/26/17  4:33 PM  Result Value Ref Range Status   Specimen Description TRACHEAL ASPIRATE  Final   Special Requests NONE  Final   Gram Stain   Final    RARE WBC PRESENT,BOTH PMN AND MONONUCLEAR NO SQUAMOUS EPITHELIAL CELLS SEEN RARE GRAM POSITIVE COCCI IN PAIRS RARE GRAM POSITIVE RODS Performed at [facility], [address] [place], [place] [address]    Culture MODERATE PSEUDOMONAS AERUGINOSA  Final   Report Status 10/28/2017 FINAL  Final   Organism ID, Bacteria PSEUDOMONAS AERUGINOSA  Final      Susceptibility   Pseudomonas aeruginosa - MIC*    CEFTAZIDIME 16 INTERMEDIATE Intermediate     CIPROFLOXACIN >=4 RESISTANT Resistant     GENTAMICIN <=1 SENSITIVE Sensitive     IMIPENEM 2 SENSITIVE Sensitive     CEFEPIME INTERMEDIATE Intermediate     * MODERATE PSEUDOMONAS AERUGINOSA     Scheduled Meds: .  albuterol  2.5 mg Nebulization Q4H  . apixaban  5 mg Per Tube BID  . budesonide  0.5 mg Nebulization BID  . chlorhexidine gluconate (MEDLINE KIT)  15 mL Mouth Rinse BID  . clonazePAM  1 mg Per Tube Q8H  . famotidine  20 mg Per Tube Q12H  . feeding supplement (VITAL HIGH PROTEIN)  1,000 mL Per Tube Q24H  . free  water  200 mL Per Tube Q4H  . insulin aspart  0-9 Units Subcutaneous Q4H  . mouth rinse  15 mL Mouth Rinse 10 times per day  . midodrine  5 mg Per Tube TID WC  . pantoprazole sodium  40 mg Per Tube Daily  . PARoxetine  30 mg Per Tube Daily  . polyethylene glycol  17 g Per Tube Daily  . predniSONE  30 mg Per Tube Q breakfast  . QUEtiapine  100 mg Per Tube BID  . rOPINIRole  0.5 mg Per Tube QHS   Continuous Infusions: . sodium chloride 10 mL/hr at 10/31/17 1200  . alpha-1 proteinase inhibitor (PROLASTIN-C) infusion Stopped (10/31/17 1029)  . meropenem (MERREM) IV 1 g (10/31/17 1440)     LOS: 5 days   Cherene Altes, MD [facility] Office  [phone] Pager - Text Page per Amion as per below:  On-Call/Text Page:      [email or web address]      password [email or web address]  If 7PM-7AM, please contact night-coverage [email or web address] Password [email or web address] 10/31/2017, 4:53 PM

## 2017-10-31 NOTE — Progress Notes (Signed)
Pharmacy Antibiotic Note  Danielle Stevens is a 47 y.o. female admitted on 10/26/2017 with respiratory distress.  She was recently admitted (6/18-6/24/19) and then discharged to [facility]. Pharmacy dosing meropenem pseudomonas pneumonia. Today is D#4 of meropenem therapy. WBC 19.3. SCr has returned to baseline   Vanc 6/30 >>7/1 Cefepime x 1 6/30 Cetaz 7/1>>7/2 Meropenem 7/2>>  6/30 TA -moderate pseudomonas (S to imi; I to cipro, cefepime, ceftaz) 6/30 BCx - ngtd 6/30 mrsa pcr: neg  Plan: Continue meropenem 1 gm IV Q 8 hours. Consider 7 day course Monitor clinical progress, c/s, renal function   Height: 5\' 4"  (162.6 cm) Weight: 212 lb 8.4 oz (96.4 kg) IBW/kg (Calculated) : 54.7  Temp (24hrs), Avg:98.3 F (36.8 C), Min:98 F (36.7 C), Max:98.8 F (37.1 C)  Recent Labs  Lab 10/26/17 1059 10/26/17 1512 10/27/17 0524 10/28/17 0508 10/29/17 0303 10/30/17 0906 10/31/17 0336  WBC  --   --  21.7* 20.6* 17.3* 19.3* 19.3*  CREATININE  --   --  0.74 0.63 0.56 0.44 0.37*  LATICACIDVEN 2.39* 2.5*  --   --   --   --   --     Estimated Creatinine Clearance: 96.9 mL/min (A) (by C-G formula based on SCr of 0.37 mg/dL (L)).    Allergies  Allergen Reactions  . Wasp Venom Protein Shortness Of Breath  . Adhesive [Tape] Other (See Comments)    Bruises   . Coconut Oil Hives  . Latex Other (See Comments)    Bruises   . Robitussin Severe Multi-Symp [Phenylephrine-Dm-Gg-Apap] Diarrhea and Other (See Comments)    Allergic, per verbal MAR  . Shellfish-Derived Products Hives and Other (See Comments)    Allergic, per verbal MAR   Vinnie Level, PharmD., BCPS Clinical Pharmacist Clinical phone for 10/31/17 until 3:30pm: [phone] If after 3:30pm, please refer to [facility] for unit-specific pharmacist

## 2017-10-31 NOTE — Plan of Care (Signed)
  Problem: Coping: Goal: Level of anxiety will decrease Outcome: Not Progressing Note:  Patient continues to frequently call out and state that she cannot breathe. During these instances patient is clearly anxious and agitated- but O2 saturation remains stable. Frequently wants to be suctioned, regardless of true need. Secretions remain pink and frothy.

## 2017-10-31 NOTE — Progress Notes (Signed)
PULMONARY / CRITICAL CARE MEDICINE   Name: Danielle Stevens MRN: [id number] DOB: 28-Jan-1971    ADMISSION DATE:  10/26/2017  REFERRING MD:  Dr. Lynelle Doctor, ER  CHIEF COMPLAINT:  Short of breath  HISTORY OF PRESENT ILLNESS:   47 yo female former smoker from [facility] with Lt sided chest pain, respiratory failure from spontaneous pneumothorax.  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  She was in hospital from 10/14/17 to 10/20/17 with Aspiration pneumonia with  Enterobacter bacteremia, Lt lung PE on eliquis.  SUBJECTIVE:  No air leak.  Chest tube has been on water seal.  Discussed whether we can stop prolastin >> she was told she needs to take this forever, and can't live w/o it.  VITAL SIGNS: BP 99/62   Pulse (!) 102   Temp 98.8 F (37.1 C) (Oral)   Resp (!) 24   Ht 5\' 4"  (1.626 m)   Wt 212 lb 8.4 oz (96.4 kg)   SpO2 98%   BMI 36.48 kg/m   VENTILATOR SETTINGS: Vent Mode: PCV FiO2 (%):  [40 %] 40 % Set Rate:  [24 bmp] 24 bmp PEEP:  [5 cmH20] 5 cmH20 Plateau Pressure:  [23 cmH20-27 cmH20] 26 cmH20  INTAKE / OUTPUT: I/O last 3 completed shifts: In: 3989.3 [I.V.:289.3; NG/GT:3000; IV Piggyback:700] Out: 2030 [Urine:1650; Stool:350; Chest Tube:30]  PHYSICAL EXAMINATION:  General - alert Eyes - pupils reactive ENT - trach site with yellow secretions Cardiac - regular, no murmur Chest - decreased BS, no wheeze, no air leak in chest tube Abd - soft, non tender Ext - 1+ edema Skin - no rashes Neuro - follows commands   LABS:  BMET Recent Labs  Lab 10/29/17 0303 10/30/17 0906 10/31/17 0336  NA 136 137 137  K 3.2* 3.6 4.0  CL 93* 95* 95*  CO2 32 32 32  BUN 26* 18 16  CREATININE 0.56 0.44 0.37*  GLUCOSE 152* 175* 126*   Electrolytes Recent Labs  Lab 10/27/17 1556 10/28/17 0508 10/29/17 0303 10/30/17 0906 10/31/17 0336  CALCIUM  --  8.9 8.9 8.3* 8.5*  MG 1.8 1.8  --   1.7 1.8  PHOS 3.0 2.9  --   --  3.1   CBC Recent Labs  Lab 10/29/17 0303 10/30/17 0906 10/31/17 0336  WBC 17.3* 19.3* 19.3*  HGB 8.8* 9.2* 8.8*  HCT 30.0* 32.4* 31.4*  PLT 276 244 226   Coag's No results for input(s): APTT, INR in the last 168 hours.  Sepsis Markers Recent Labs  Lab 10/26/17 1059 10/26/17 1512  LATICACIDVEN 2.39* 2.5*   ABG Recent Labs  Lab 10/26/17 0946 10/27/17 0324  PHART 7.219* 7.490*  PCO2ART >91.0* 43.0  PO2ART 61.0* 72.0*   Liver Enzymes Recent Labs  Lab 10/26/17 1056 10/29/17 0303  AST 54* 34  ALT 61* 46*  ALKPHOS 92 80  BILITOT 1.0 0.7  ALBUMIN 3.0* 2.1*   Cardiac Enzymes Recent Labs  Lab 10/26/17 1056  TROPONINI 0.06*   Glucose Recent Labs  Lab 10/30/17 1119 10/30/17 1617 10/30/17 1918 10/31/17 0006 10/31/17 0333 10/31/17 0739  GLUCAP 176* 195* 174* 148* 115* 138*   Imaging Dg Chest Port 1 View  Result Date: 10/31/2017 CLINICAL DATA:  Intubation. EXAM: PORTABLE CHEST 1 VIEW COMPARISON:  10/30/2017.  10/29/2017. FINDINGS: Tracheostomy tube noted in stable position. Left chest tube in stable position. No pneumothorax identified. Heart size normal. Left base atelectasis/infiltrate unchanged. Small left pleural effusion. IMPRESSION: 1. Tracheostomy  tube and left chest tube stable position. No pneumothorax. 2. Left base atelectasis/infiltrate unchanged. Small left pleural effusion. Electronically Signed   By: Maisie Fus  Register   On: 10/31/2017 06:13   STUDIES:   CULTURES: Blood 6/30 >> Sputum 6/30 >> Pseudomonas aeruginosa  ANTIBIOTICS: Vancomycin 6/30 >> 6/30 Cefepime 6/30 >> 6/30 Fortaz 7/01 >> 7/02 Meropenem 7/02 >>   SIGNIFICANT EVENTS: 6/30 Admit, TCTS consulted, fever 101.3 7/01 TCTS s/o  LINES/TUBES: Trach >>  PEG >> Lt inferior chest tube 6/30 >> 7/03 Lt anterior chest tube 6/30 >> 7/05  DISCUSSION: 47 yo female with spontaneous Lt PTX with acute on chronic hypoxic/hypercapnic respiratory failure in  setting of A1AT with severe COPD/emphysema.  Will d/c 2nd chest tube.  ASSESSMENT / PLAN:  Acute on chronic hypoxic/hypercapnic respiratory failure. A1AT with severe COPD/emphysema. - full vent support >> don't think she will ever wean off vent - continue pulmicort, albuterol - wean off steroids as able - she is not willing to try stopping prolastin  Lt pneumothorax. - Anterior chest tube placed by TCTS >> s/o 7/01 and deferring management to PCCM - d/c anterior chest tube 7/05 and f/u CXR  Hx of PE. - eliquis  Hypokalemia: - f/u BMET  Chronic hypotension. - midodrine  HCAP with Pseudomonas in sputum. - day 6 of ABx  Severe anxiety, RLS. - Scheduled klonopin, paxil, seroquel, requip  DM type II. - SSI  Anemia of critical illness, and chronic disease. - f/u CBC intermittently  DVT prophylaxis - eliquis SUP - protonix Nutrition - tube feeds, ice chips Goals of care - full code  Coralyn Helling, MD [facility] Pulmonary/Critical Care 10/31/2017, 10:08 AM

## 2017-11-01 ENCOUNTER — Inpatient Hospital Stay (HOSPITAL_COMMUNITY): Payer: Medicare Other

## 2017-11-01 DIAGNOSIS — J151 Pneumonia due to Pseudomonas: Secondary | ICD-10-CM

## 2017-11-01 DIAGNOSIS — J9311 Primary spontaneous pneumothorax: Secondary | ICD-10-CM | POA: Diagnosis not present

## 2017-11-01 DIAGNOSIS — Z9911 Dependence on respirator [ventilator] status: Secondary | ICD-10-CM | POA: Diagnosis not present

## 2017-11-01 DIAGNOSIS — E8801 Alpha-1-antitrypsin deficiency: Secondary | ICD-10-CM | POA: Diagnosis not present

## 2017-11-01 DIAGNOSIS — Z930 Tracheostomy status: Secondary | ICD-10-CM | POA: Diagnosis not present

## 2017-11-01 LAB — GLUCOSE, CAPILLARY
GLUCOSE-CAPILLARY: 180 mg/dL — AB (ref 70–99)
GLUCOSE-CAPILLARY: 90 mg/dL (ref 70–99)
Glucose-Capillary: 101 mg/dL — ABNORMAL HIGH (ref 70–99)
Glucose-Capillary: 119 mg/dL — ABNORMAL HIGH (ref 70–99)
Glucose-Capillary: 157 mg/dL — ABNORMAL HIGH (ref 70–99)
Glucose-Capillary: 126 mg/dL — ABNORMAL HIGH (ref 70–99)

## 2017-11-01 MED ORDER — FUROSEMIDE 8 MG/ML PO SOLN
40.0000 mg | Freq: Once | ORAL | Status: AC
Start: 2017-11-01 — End: 2017-11-01
  Administered 2017-11-01: 40 mg
  Filled 2017-11-01 (×2): qty 5

## 2017-11-01 MED ORDER — POTASSIUM CHLORIDE 20 MEQ/15ML (10%) PO SOLN
40.0000 meq | Freq: Once | ORAL | Status: AC
  Administered 2017-11-01: 40 meq
  Filled 2017-11-01: qty 30

## 2017-11-01 MED ORDER — PREDNISONE 20 MG PO TABS
20.0000 mg | ORAL_TABLET | Freq: Every day | ORAL | Status: DC
  Administered 2017-11-03 – 2017-11-06 (×4): 20 mg
  Filled 2017-11-01 (×4): qty 1

## 2017-11-01 NOTE — Progress Notes (Signed)
PULMONARY / CRITICAL CARE MEDICINE   Name: Danielle Stevens MRN: [id number] DOB: 1971-03-28    ADMISSION DATE:  10/26/2017  REFERRING MD:  Dr. Lynelle Doctor, ER  CHIEF COMPLAINT:  Short of breath  HISTORY OF PRESENT ILLNESS:   47 yo female former smoker from [facility] with Lt sided chest pain, respiratory failure from spontaneous pneumothorax.  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  She was in hospital from 10/14/17 to 10/20/17 with Aspiration pneumonia with  Enterobacter bacteremia, Lt lung PE on eliquis.  SUBJECTIVE:  Has increased respiratory secretions.  Feels swollen.  VITAL SIGNS: BP (!) 95/57   Pulse 90   Temp 98.7 F (37.1 C) (Oral)   Resp 12   Ht 5\' 4"  (1.626 m)   Wt 212 lb 15.4 oz (96.6 kg)   SpO2 94%   BMI 36.56 kg/m   VENTILATOR SETTINGS: Vent Mode: PCV FiO2 (%):  [40 %] 40 % Set Rate:  [24 bmp] 24 bmp PEEP:  [5 cmH20] 5 cmH20 Plateau Pressure:  [22 cmH20-25 cmH20] 22 cmH20  INTAKE / OUTPUT: I/O last 3 completed shifts: In: 4579.3 [I.V.:329.5; NG/GT:3500; IV Piggyback:749.9] Out: 1975 [Urine:1450; Stool:525]  PHYSICAL EXAMINATION:  General - pleasant Eyes - pupils reactive ENT - trach site with tan secretions Cardiac - regular, no murmur Chest - decreased BS, scattered rhonchi, no wheeze Abd - soft, non tender Ext - 1+ edema Skin - no rashes Neuro - follows commands   LABS:  BMET Recent Labs  Lab 10/29/17 0303 10/30/17 0906 10/31/17 0336  NA 136 137 137  K 3.2* 3.6 4.0  CL 93* 95* 95*  CO2 32 32 32  BUN 26* 18 16  CREATININE 0.56 0.44 0.37*  GLUCOSE 152* 175* 126*   Electrolytes Recent Labs  Lab 10/27/17 1556 10/28/17 0508 10/29/17 0303 10/30/17 0906 10/31/17 0336  CALCIUM  --  8.9 8.9 8.3* 8.5*  MG 1.8 1.8  --  1.7 1.8  PHOS 3.0 2.9  --   --  3.1   CBC Recent Labs  Lab 10/29/17 0303 10/30/17 0906 10/31/17 0336  WBC 17.3* 19.3* 19.3*   HGB 8.8* 9.2* 8.8*  HCT 30.0* 32.4* 31.4*  PLT 276 244 226   Coag's No results for input(s): APTT, INR in the last 168 hours.  Sepsis Markers Recent Labs  Lab 10/26/17 1059 10/26/17 1512  LATICACIDVEN 2.39* 2.5*   ABG Recent Labs  Lab 10/26/17 0946 10/27/17 0324  PHART 7.219* 7.490*  PCO2ART >91.0* 43.0  PO2ART 61.0* 72.0*   Liver Enzymes Recent Labs  Lab 10/26/17 1056 10/29/17 0303  AST 54* 34  ALT 61* 46*  ALKPHOS 92 80  BILITOT 1.0 0.7  ALBUMIN 3.0* 2.1*   Cardiac Enzymes Recent Labs  Lab 10/26/17 1056  TROPONINI 0.06*   Glucose Recent Labs  Lab 10/31/17 1126 10/31/17 1613 10/31/17 1927 10/31/17 2344 11/01/17 0355 11/01/17 0804  GLUCAP 180* 165* 134* 102* 101* 119*   Imaging Dg Chest Port 1 View  Result Date: 11/01/2017 CLINICAL DATA:  Respiratory failure EXAM: PORTABLE CHEST 1 VIEW COMPARISON:  October 31, 2017 FINDINGS: Tracheostomy catheter tip is 8.7 cm above the carina. No pneumothorax. There is a left pleural effusion with consolidation in the left base. There is underlying hyperexpansion of the lungs. There is bullous disease in the upper lobes as well as to a lesser degree in the right base. Heart size is normal. Pulmonary vascularity reflects underlying emphysematous change with decreased  vascularity in the upper lobes. No adenopathy evident. No evident bone lesions. There is aortic atherosclerosis. IMPRESSION: Tracheostomy as described. No pneumothorax. Underlying emphysematous change. Persistent left pleural effusion with consolidation left base region, consistent with pneumonia. Stable cardiac silhouette. There is aortic atherosclerosis. Appearance overall essentially stable from 1 day prior. Aortic Atherosclerosis (ICD10-I70.0) and Emphysema (ICD10-J43.9). Electronically Signed   By: Bretta Bang III M.D.   On: 11/01/2017 07:13   Dg Chest Port 1 View  Result Date: 10/31/2017 CLINICAL DATA:  Left chest tube removal.  Left effusion. EXAM:  PORTABLE CHEST 1 VIEW 2:54 p.m. COMPARISON:  10/31/2017 at 4:55 a.m. and 10/30/2017, 10/29/2017 and 10/28/2017 FINDINGS: Left-sided chest tube has been removed. No residual pneumothorax. Tracheostomy tube unchanged with the tip at the thoracic inlet. Persistent left pleural effusion. Severe emphysematous changes are again noted bilaterally. Heart size is normal. Pulmonary vascularity is within normal limits. IMPRESSION: 1. No pneumothorax after left chest tube removal. 2. Persistent  moderate left pleural effusion. Electronically Signed   By: Francene Boyers M.D.   On: 10/31/2017 15:09   STUDIES:   CULTURES: Blood 6/30 >> negative Sputum 6/30 >> Pseudomonas aeruginosa  ANTIBIOTICS: Vancomycin 6/30 >> 6/30 Cefepime 6/30 >> 6/30 Fortaz 7/01 >> 7/02 Meropenem 7/02 >>   SIGNIFICANT EVENTS: 6/30 Admit, TCTS consulted, fever 101.3 7/01 TCTS s/o  LINES/TUBES: Trach >>  PEG >> Lt inferior chest tube 6/30 >> 7/03 Lt anterior chest tube 6/30 >> 7/05  DISCUSSION: 47 yo female with spontaneous Lt PTX with acute on chronic hypoxic/hypercapnic respiratory failure in setting of A1AT with severe COPD/emphysema complicated by HCAP.  No issues after d/c'ing chest tubes.  Main concern now is Pseudomonal pneumonia.  ASSESSMENT / PLAN:  Acute on chronic hypoxic/hypercapnic respiratory failure. A1AT with severe COPD/emphysema. - full vent support >> unable to tolerate vent weaning - pulmicort, albuterol - change prednisone to 20 mg daily - she is not willing to try stopping prolastin  Lt pneumothorax. - chest tube successfully removed 7/05  Hx of PE. - eliquis  Hypokalemia. - f/u BMET  Edema. - lasix 40 mg x one 7/06  Chronic hypotension. - midodrine  HCAP with Pseudomonas in sputum. - Day 7 of ABx  Severe anxiety, RLS. - Scheduled klonopin, paxil, seroquel, requip  DM type II with steroid induced hyperglycemia. - SSI  Anemia of critical illness, and chronic disease. - f/u CBC  intermittently  DVT prophylaxis - eliquis SUP - protonix Nutrition - tube feeds, ice chips Goals of care - full code  PCCM will f/u Monday, 11/03/17 > call if help needed sooner  Coralyn Helling, MD [facility] Pulmonary/Critical Care 11/01/2017, 8:36 AM

## 2017-11-01 NOTE — Progress Notes (Signed)
[place] TEAM 1 - Stepdown/ICU TEAM  Danielle Stevens  ZOX:[id number] DOB: Sep 21, 1970 DOA: 10/26/2017 PCP: Patient, No Pcp Per    Brief Narrative:  47yo F former smoker from [place] with L sided chest pain and acute respiratory failure who was found to have a spontaneous PTX.  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  She was in hospital from 10/14/17 to 10/20/17 with aspiration pneumonia with Enterobacter bacteremia, Lt lung PE on eliquis.  Significant Events: 6/30 Admit, TCTS consulted, fever 101.3 - L inferior (pigtial - per EDP) and anterior (full size - per TCTS) chest tubes 7/01 TCTS s/o 7/2 TRH assumed care  7/5 anterior chest tube removed   Subjective: All active issues have been addressed by PCCM again today.  Pt remains on [facility] service and I will be providing her care on 11/02/17.    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 not transplant candidate per [facility] and [facility] - cont weekly Prolastin as pt is not willing to consider stopping it - full vent support per PCCM - no plans to attempt vent wean   L spontaneous pneumothorax anterior chest tube placed by TCTS, and inferior pigtail placed by EDP - all chest tubes now out per PCCM and stable   Hx of PE (acute June 2019) continue eliquis for 6 months (through November), then lifelong pharmacologic prophylaxis thereafter  Chronic hypotension continue midodrine  Pseudomonas HCAP  Cont merem  Severe anxiety, RLS scheduled klonopin, paxil, seroquel, requip  Chronic multifactorial normocytic anemia Hgb is stable - no evidence of bleeding on anticoag   DM2 controlled without complication 8/11 B1Y 5.1 - CBGwellcontrolled   Diarrhea C diff negative during hospital stay late June   DVT prophylaxis: Eliquis  Code Status: FULL  CODE Family Communication:  Disposition Plan: ICU on chronic vent   Consultants:  PCCM TCTS  Antimicrobials:  Vancomycin 6/30  Cefepime 6/30  Fortaz 7/01 >  Objective: Blood pressure 100/60, pulse 96, temperature 98.6 F (37 C), temperature source Oral, resp. rate 17, height '5\' 4"'$  (1.626 m), weight 96.6 kg (212 lb 15.4 oz), SpO2 96 %.  Intake/Output Summary (Last 24 hours) at 11/01/2017 1104 Last data filed at 11/01/2017 0700 Gross per 24 hour  Intake 2484.77 ml  Output 1650 ml  Net 834.77 ml   Filed Weights   10/30/17 0500 10/31/17 0330 11/01/17 0346  Weight: 95.3 kg (210 lb 1.6 oz) 96.4 kg (212 lb 8.4 oz) 96.6 kg (212 lb 15.4 oz)    Examination: No exam today - care per PCCM  CBC: Recent Labs  Lab 10/29/17 0303 10/30/17 0906 10/31/17 0336  WBC 17.3* 19.3* 19.3*  HGB 8.8* 9.2* 8.8*  HCT 30.0* 32.4* 31.4*  MCV 80.4 83.1 83.1  PLT 276 244 782   Basic Metabolic Panel: Recent Labs  Lab 10/27/17 1556 10/28/17 0508 10/29/17 0303 10/30/17 0906 10/31/17 0336  NA  --  135 136 137 137  K  --  3.6 3.2* 3.6 4.0  CL  --  94* 93* 95* 95*  CO2  --  32 32 32 32  GLUCOSE  --  162* 152* 175* 126*  BUN  --  32* 26* 18 16  CREATININE  --  0.63 0.56 0.44 0.37*  CALCIUM  --  8.9 8.9 8.3* 8.5*  MG 1.8 1.8  --  1.7 1.8  PHOS 3.0 2.9  --   --  3.1   GFR: Estimated Creatinine Clearance: 97.1 mL/min (A) (by C-G formula based on SCr of 0.37 mg/dL (L)).  Liver Function Tests: Recent Labs  Lab 10/26/17 1056 10/29/17 0303  AST 54* 34  ALT 61* 46*  ALKPHOS 92 80  BILITOT 1.0 0.7  PROT 5.9* 4.9*  ALBUMIN 3.0* 2.1*    Cardiac Enzymes: Recent Labs  Lab 10/26/17 1056  TROPONINI 0.06*    HbA1C: Hgb A1c MFr Bld  Date/Time Value Ref Range Status  09/12/2017 05:17 AM 5.1 4.8 - 5.6 % Final    Comment:    (NOTE) Pre diabetes:          5.7%-6.4% Diabetes:              >6.4% Glycemic control for   <7.0% adults with diabetes     CBG: Recent Labs  Lab  10/31/17 1613 10/31/17 1927 10/31/17 2344 11/01/17 0355 11/01/17 0804  GLUCAP 165* 134* 102* 101* 119*    Recent Results (from the past 240 hour(s))  Blood culture (routine x 2)     Status: None   Collection Time: 10/26/17 10:50 AM  Result Value Ref Range Status   Specimen Description BLOOD RIGHT ARM  Final   Special Requests   Final    BOTTLES DRAWN AEROBIC AND ANAEROBIC Blood Culture adequate volume   Culture   Final    NO GROWTH 5 DAYS Performed at [facility], [place] [address], [place], [place] [address]    Report Status 10/31/2017 FINAL  Final  MRSA PCR Screening     Status: None   Collection Time: 10/26/17  2:42 PM  Result Value Ref Range Status   MRSA by PCR NEGATIVE NEGATIVE Final    Comment:        The GeneXpert MRSA Assay (FDA approved for NASAL specimens only), is one component of a comprehensive MRSA colonization surveillance program. It is not intended to diagnose MRSA infection nor to guide or monitor treatment for MRSA infections. Performed at [facility], [place] [address] [place], [place] [address]   Culture, respiratory     Status: None   Collection Time: 10/26/17  4:33 PM  Result Value Ref Range Status   Specimen Description TRACHEAL ASPIRATE  Final   Special Requests NONE  Final   Gram Stain   Final    RARE WBC PRESENT,BOTH PMN AND MONONUCLEAR NO SQUAMOUS EPITHELIAL CELLS SEEN RARE GRAM POSITIVE COCCI IN PAIRS RARE GRAM POSITIVE RODS Performed at [facility], [address], [place], [place] [address]    Culture MODERATE PSEUDOMONAS AERUGINOSA  Final   Report Status 10/28/2017 FINAL  Final   Organism ID, Bacteria PSEUDOMONAS AERUGINOSA  Final      Susceptibility   Pseudomonas aeruginosa - MIC*    CEFTAZIDIME 16 INTERMEDIATE Intermediate     CIPROFLOXACIN >=4 RESISTANT Resistant     GENTAMICIN <=1 SENSITIVE Sensitive     IMIPENEM 2 SENSITIVE Sensitive     CEFEPIME INTERMEDIATE Intermediate     * MODERATE PSEUDOMONAS  AERUGINOSA     Scheduled Meds: . albuterol  2.5 mg Nebulization Q4H  . apixaban  5 mg Per Tube BID  . budesonide  0.5 mg Nebulization BID  . chlorhexidine gluconate (MEDLINE KIT)  15 mL Mouth Rinse BID  . clonazePAM  1 mg Per Tube Q8H  . famotidine  20 mg Per Tube Q12H  . feeding supplement (VITAL HIGH PROTEIN)  1,000 mL Per Tube Q24H  . free  water  200 mL Per Tube Q4H  . furosemide  40 mg Per Tube Once  . insulin aspart  0-9 Units Subcutaneous Q4H  . mouth rinse  15 mL Mouth Rinse 10 times per day  . midodrine  5 mg Per Tube TID WC  . pantoprazole sodium  40 mg Per Tube Daily  . PARoxetine  30 mg Per Tube Daily  . polyethylene glycol  17 g Per Tube Daily  . [START ON 11/02/2017] predniSONE  20 mg Per Tube Q breakfast  . QUEtiapine  100 mg Per Tube BID  . rOPINIRole  0.5 mg Per Tube QHS   Continuous Infusions: . sodium chloride 10 mL/hr at 11/01/17 0700  . alpha-1 proteinase inhibitor (PROLASTIN-C) infusion Stopped (10/31/17 1029)  . meropenem (MERREM) IV Stopped (11/01/17 7741)     LOS: 6 days   Cherene Altes, MD [facility] Office  [phone] Pager - Text Page per Amion as per below:  On-Call/Text Page:      [email or web address]      password [email or web address]  If 7PM-7AM, please contact night-coverage [email or web address] Password [email or web address] 11/01/2017, 11:04 AM

## 2017-11-02 ENCOUNTER — Inpatient Hospital Stay (HOSPITAL_COMMUNITY): Payer: Medicare Other

## 2017-11-02 DIAGNOSIS — Z930 Tracheostomy status: Secondary | ICD-10-CM | POA: Diagnosis not present

## 2017-11-02 DIAGNOSIS — E8801 Alpha-1-antitrypsin deficiency: Secondary | ICD-10-CM | POA: Diagnosis not present

## 2017-11-02 DIAGNOSIS — Z9911 Dependence on respirator [ventilator] status: Secondary | ICD-10-CM | POA: Diagnosis not present

## 2017-11-02 DIAGNOSIS — J9311 Primary spontaneous pneumothorax: Secondary | ICD-10-CM | POA: Diagnosis not present

## 2017-11-02 DIAGNOSIS — J449 Chronic obstructive pulmonary disease, unspecified: Secondary | ICD-10-CM | POA: Diagnosis not present

## 2017-11-02 LAB — CBC
HEMATOCRIT: 27.8 % — AB (ref 36.0–46.0)
HEMOGLOBIN: 7.9 g/dL — AB (ref 12.0–15.0)
MCH: 23.7 pg — AB (ref 26.0–34.0)
MCHC: 28.4 g/dL — ABNORMAL LOW (ref 30.0–36.0)
MCV: 83.5 fL (ref 78.0–100.0)
Platelets: 231 10*3/uL (ref 150–400)
RBC: 3.33 MIL/uL — ABNORMAL LOW (ref 3.87–5.11)
RDW: 17.6 % — AB (ref 11.5–15.5)
WBC: 12.5 10*3/uL — ABNORMAL HIGH (ref 4.0–10.5)

## 2017-11-02 LAB — GLUCOSE, CAPILLARY
GLUCOSE-CAPILLARY: 91 mg/dL (ref 70–99)
GLUCOSE-CAPILLARY: 81 mg/dL (ref 70–99)
Glucose-Capillary: 89 mg/dL (ref 70–99)
Glucose-Capillary: 87 mg/dL (ref 70–99)
Glucose-Capillary: 86 mg/dL (ref 70–99)
Glucose-Capillary: 83 mg/dL (ref 70–99)

## 2017-11-02 LAB — COMPREHENSIVE METABOLIC PANEL
ALK PHOS: 86 U/L (ref 38–126)
ALT: 34 U/L (ref 0–44)
ANION GAP: 6 (ref 5–15)
AST: 30 U/L (ref 15–41)
Albumin: 2 g/dL — ABNORMAL LOW (ref 3.5–5.0)
BILIRUBIN TOTAL: 0.7 mg/dL (ref 0.3–1.2)
BUN: 12 mg/dL (ref 6–20)
CALCIUM: 8.3 mg/dL — AB (ref 8.9–10.3)
CO2: 32 mmol/L (ref 22–32)
Chloride: 101 mmol/L (ref 98–111)
Creatinine, Ser: 0.34 mg/dL — ABNORMAL LOW (ref 0.44–1.00)
Glucose, Bld: 81 mg/dL (ref 70–99)
POTASSIUM: 4 mmol/L (ref 3.5–5.1)
Sodium: 139 mmol/L (ref 135–145)
Total Protein: 5 g/dL — ABNORMAL LOW (ref 6.5–8.1)

## 2017-11-02 MED ORDER — ALBUTEROL SULFATE (2.5 MG/3ML) 0.083% IN NEBU
2.5000 mg | INHALATION_SOLUTION | Freq: Four times a day (QID) | RESPIRATORY_TRACT | Status: DC
  Administered 2017-11-02 – 2017-11-06 (×16): 2.5 mg via RESPIRATORY_TRACT
  Filled 2017-11-02 (×16): qty 3

## 2017-11-02 MED ORDER — METOCLOPRAMIDE HCL 5 MG/5ML PO SOLN
5.0000 mg | Freq: Three times a day (TID) | ORAL | Status: DC
  Administered 2017-11-02 (×2): 5 mg
  Filled 2017-11-02 (×2): qty 10

## 2017-11-02 NOTE — Progress Notes (Signed)
[place] TEAM 1 - Stepdown/ICU TEAM  Danielle Stevens  PPI:[id number] DOB: 1971/02/08 DOA: 10/26/2017 PCP: Patient, No Pcp Per    Brief Narrative:  47yo F former smoker from [place] with L sided chest pain and acute respiratory failure who was found to have a spontaneous PTX.  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  She was in hospital from 10/14/17 to 10/20/17 with aspiration pneumonia with Enterobacter bacteremia, Lt lung PE on eliquis.  Significant Events: 6/30 Admit, TCTS consulted, fever 101.3 - L inferior (pigtial - per EDP) and anterior (full size - per TCTS) chest tubes 7/01 TCTS s/o 7/2 TRH assumed care  7/5 anterior chest tube removed   Subjective: The patient appears to have refluxed significant amount of tube feeding through her tracheostomy.  She reports a sensation of anxiousness and difficulty breathing.  A chest x-ray is without acute changes.  Clinically she appears anxious but otherwise stable.  She denies chest pain nausea or vomiting.  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 not transplant candidate per [facility] and [facility] - cont weekly Prolastin as pt is not willing to consider stopping it - full vent support per PCCM - no plans to attempt vent wean   L spontaneous pneumothorax anterior chest tube placed by TCTS, and inferior pigtail placed by EDP - all chest tubes now out per PCCM and stable   Reflux w/ aspiration  No clear hx of vomiting but trach / vent tubing clearly stained w/ tube feeding - suspect she had a signif episode of reflux - hold TF for now - begin scheduled reglan - cont oral meds - clinically stable   Hx of PE (acute June 2019) continue eliquis for 6 months (through November) then lifelong pharmacologic prophylaxis thereafter  Chronic hypotension continue  midodrine  Pseudomonas HCAP  Cont merem to complete a 10 day tx course   Severe anxiety, RLS scheduled klonopin, paxil, seroquel, requip  Chronic multifactorial normocytic anemia Hgb is slowly trending down - no evidence of bleeding on anticoag - follow trend   DM2 controlled without complication 0/63 K1S 5.1 - CBGwellcontrolled   Diarrhea C diff negative during hospital stay late June   DVT prophylaxis: Eliquis  Code Status: FULL CODE Family Communication: no family present  Disposition Plan: ICU on chronic vent   Consultants:  PCCM TCTS  Antimicrobials:  Vancomycin 6/30  Cefepime 6/30  Fortaz 7/01 > 7/02 Merem 7/2 >   Objective: Blood pressure 112/67, pulse (!) 112, temperature 99.2 F (37.3 C), temperature source Oral, resp. rate 17, height _0  (1.626 m), weight 96.8 kg (213 lb 6.5 oz), SpO2 93 %.  Intake/Output Summary (Last 24 hours) at 11/02/2017 1759 Last data filed at 11/02/2017 1600 Gross per 24 hour  Intake 1194.52 ml  Output 1225 ml  Net -30.48 ml   Filed Weights   10/31/17 0330 11/01/17 0346 11/02/17 0500  Weight: 96.4 kg (212 lb 8.4 oz) 96.6 kg (212 lb 15.4 oz) 96.8 kg (213 lb 6.5 oz)    Examination: General: No acute respiratory distress Lungs: very poor air movement L base - no wheezing  Cardiovascular: Regular rate and rhythm without murmur  Abdomen: Nontender, nondistended, soft, bowel sounds positive, no rebound, no ascites, no appreciable mass Extremities: trace anasarca    CBC: Recent Labs  Lab 10/30/17 0906 10/31/17 0336 11/02/17 0301  WBC 19.3* 19.3*  12.5*  HGB 9.2* 8.8* 7.9*  HCT 32.4* 31.4* 27.8*  MCV 83.1 83.1 83.5  PLT 244 226 161   Basic Metabolic Panel: Recent Labs  Lab 10/27/17 1556 10/28/17 0508  10/30/17 0906 10/31/17 0336 11/02/17 0301  NA  --  135   < > 137 137 139  K  --  3.6   < > 3.6 4.0 4.0  CL  --  94*   < > 95* 95* 101  CO2  --  32   < > 32 32 32  GLUCOSE  --  162*   < > 175* 126* 81  BUN  --   32*   < > _0 CREATININE  --  0.63   < > 0.44 0.37* 0.34*  CALCIUM  --  8.9   < > 8.3* 8.5* 8.3*  MG 1.8 1.8  --  1.7 1.8  --   PHOS 3.0 2.9  --   --  3.1  --    < > = values in this interval not displayed.   GFR: Estimated Creatinine Clearance: 97.1 mL/min (A) (by C-G formula based on SCr of 0.34 mg/dL (L)).  Liver Function Tests: Recent Labs  Lab 10/29/17 0303 11/02/17 0301  AST 34 30  ALT 46* 34  ALKPHOS 80 86  BILITOT 0.7 0.7  PROT 4.9* 5.0*  ALBUMIN 2.1* 2.0*    HbA1C: Hgb A1c MFr Bld  Date/Time Value Ref Range Status  09/12/2017 05:17 AM 5.1 4.8 - 5.6 % Final    Comment:    (NOTE) Pre diabetes:          5.7%-6.4% Diabetes:              >6.4% Glycemic control for   <7.0% adults with diabetes     CBG: Recent Labs  Lab 11/01/17 2321 11/02/17 0321 11/02/17 0748 11/02/17 1155 11/02/17 1617  GLUCAP 90 91 81 89 87    Recent Results (from the past 240 hour(s))  Blood culture (routine x 2)     Status: None   Collection Time: 10/26/17 10:50 AM  Result Value Ref Range Status   Specimen Description BLOOD RIGHT ARM  Final   Special Requests   Final    BOTTLES DRAWN AEROBIC AND ANAEROBIC Blood Culture adequate volume   Culture   Final    NO GROWTH 5 DAYS Performed at [facility], [place] [address], [place], [place] [address]    Report Status 10/31/2017 FINAL  Final  MRSA PCR Screening     Status: None   Collection Time: 10/26/17  2:42 PM  Result Value Ref Range Status   MRSA by PCR NEGATIVE NEGATIVE Final    Comment:        The GeneXpert MRSA Assay (FDA approved for NASAL specimens only), is one component of a comprehensive MRSA colonization surveillance program. It is not intended to diagnose MRSA infection nor to guide or monitor treatment for MRSA infections. Performed at [facility], [place] [address] [place],  [address]   Culture, respiratory     Status: None   Collection Time: 10/26/17  4:33 PM  Result Value Ref Range  Status   Specimen Description TRACHEAL ASPIRATE  Final   Special Requests NONE  Final   Gram Stain   Final    RARE WBC PRESENT,BOTH PMN AND MONONUCLEAR NO SQUAMOUS EPITHELIAL CELLS SEEN RARE GRAM POSITIVE COCCI IN PAIRS RARE GRAM POSITIVE RODS Performed at [facility],  [address], [place], [place] [address]    Culture MODERATE PSEUDOMONAS AERUGINOSA  Final   Report Status 10/28/2017 FINAL  Final   Organism ID, Bacteria PSEUDOMONAS AERUGINOSA  Final      Susceptibility   Pseudomonas aeruginosa - MIC*    CEFTAZIDIME 16 INTERMEDIATE Intermediate     CIPROFLOXACIN >=4 RESISTANT Resistant     GENTAMICIN <=1 SENSITIVE Sensitive     IMIPENEM 2 SENSITIVE Sensitive     CEFEPIME INTERMEDIATE Intermediate     * MODERATE PSEUDOMONAS AERUGINOSA     Scheduled Meds: . albuterol  2.5 mg Nebulization Q4H  . apixaban  5 mg Per Tube BID  . budesonide  0.5 mg Nebulization BID  . chlorhexidine gluconate (MEDLINE KIT)  15 mL Mouth Rinse BID  . clonazePAM  1 mg Per Tube Q8H  . famotidine  20 mg Per Tube Q12H  . insulin aspart  0-9 Units Subcutaneous Q4H  . mouth rinse  15 mL Mouth Rinse 10 times per day  . metoCLOPramide  5 mg Per Tube TID  . midodrine  5 mg Per Tube TID WC  . pantoprazole sodium  40 mg Per Tube Daily  . PARoxetine  30 mg Per Tube Daily  . polyethylene glycol  17 g Per Tube Daily  . predniSONE  20 mg Per Tube Q breakfast  . QUEtiapine  100 mg Per Tube BID  . rOPINIRole  0.5 mg Per Tube QHS   Continuous Infusions: . sodium chloride 10 mL/hr at 11/02/17 1600  . alpha-1 proteinase inhibitor (PROLASTIN-C) infusion Stopped (10/31/17 1029)  . meropenem (MERREM) IV Stopped (11/02/17 1536)     LOS: 7 days   Cherene Altes, MD [facility] Office  [phone] Pager - Text Page per Amion as per below:  On-Call/Text Page:      [email or web address]      password [email or web address]  If 7PM-7AM, please contact night-coverage [email or web address] Password [email or web address] 11/02/2017, 5:59  PM

## 2017-11-03 ENCOUNTER — Inpatient Hospital Stay (HOSPITAL_COMMUNITY): Payer: Medicare Other

## 2017-11-03 DIAGNOSIS — E8801 Alpha-1-antitrypsin deficiency: Secondary | ICD-10-CM | POA: Diagnosis not present

## 2017-11-03 DIAGNOSIS — J449 Chronic obstructive pulmonary disease, unspecified: Secondary | ICD-10-CM | POA: Diagnosis not present

## 2017-11-03 DIAGNOSIS — Z9911 Dependence on respirator [ventilator] status: Secondary | ICD-10-CM | POA: Diagnosis not present

## 2017-11-03 DIAGNOSIS — J9311 Primary spontaneous pneumothorax: Secondary | ICD-10-CM | POA: Diagnosis not present

## 2017-11-03 DIAGNOSIS — Z930 Tracheostomy status: Secondary | ICD-10-CM | POA: Diagnosis not present

## 2017-11-03 DIAGNOSIS — J151 Pneumonia due to Pseudomonas: Secondary | ICD-10-CM | POA: Diagnosis not present

## 2017-11-03 LAB — BLOOD GAS, ARTERIAL
Acid-Base Excess: 9.6 mmol/L — ABNORMAL HIGH (ref 0.0–2.0)
Bicarbonate: 35.2 mmol/L — ABNORMAL HIGH (ref 20.0–28.0)
Drawn by: 414221
FIO2: 40
O2 SAT: 91.8 %
PATIENT TEMPERATURE: 98.2
PCO2 ART: 63.8 mmHg — AB (ref 32.0–48.0)
PEEP/CPAP: 8 cmH2O
PH ART: 7.36 (ref 7.350–7.450)
PO2 ART: 68.7 mmHg — AB (ref 83.0–108.0)
Pressure control: 30 cmH2O
RATE: 24 resp/min

## 2017-11-03 LAB — POCT I-STAT 3, ART BLOOD GAS (G3+)
ACID-BASE EXCESS: 11 mmol/L — AB (ref 0.0–2.0)
Bicarbonate: 36.1 mmol/L — ABNORMAL HIGH (ref 20.0–28.0)
O2 Saturation: 96 %
TCO2: 38 mmol/L — AB (ref 22–32)
pCO2 arterial: 51.3 mmHg — ABNORMAL HIGH (ref 32.0–48.0)
pH, Arterial: 7.456 — ABNORMAL HIGH (ref 7.350–7.450)
pO2, Arterial: 82 mmHg — ABNORMAL LOW (ref 83.0–108.0)

## 2017-11-03 LAB — CBC
HEMATOCRIT: 32.9 % — AB (ref 36.0–46.0)
Hemoglobin: 9.4 g/dL — ABNORMAL LOW (ref 12.0–15.0)
MCH: 23.4 pg — ABNORMAL LOW (ref 26.0–34.0)
MCHC: 28.6 g/dL — AB (ref 30.0–36.0)
MCV: 81.8 fL (ref 78.0–100.0)
Platelets: 303 10*3/uL (ref 150–400)
RBC: 4.02 MIL/uL (ref 3.87–5.11)
RDW: 17.6 % — ABNORMAL HIGH (ref 11.5–15.5)
WBC: 22.6 10*3/uL — ABNORMAL HIGH (ref 4.0–10.5)

## 2017-11-03 LAB — GLUCOSE, CAPILLARY
GLUCOSE-CAPILLARY: 92 mg/dL (ref 70–99)
GLUCOSE-CAPILLARY: 107 mg/dL — AB (ref 70–99)
Glucose-Capillary: 89 mg/dL (ref 70–99)
Glucose-Capillary: 90 mg/dL (ref 70–99)
Glucose-Capillary: 92 mg/dL (ref 70–99)
Glucose-Capillary: 95 mg/dL (ref 70–99)

## 2017-11-03 LAB — BASIC METABOLIC PANEL
Anion gap: 6 (ref 5–15)
BUN: 9 mg/dL (ref 6–20)
CHLORIDE: 98 mmol/L (ref 98–111)
CO2: 38 mmol/L — AB (ref 22–32)
Calcium: 8.7 mg/dL — ABNORMAL LOW (ref 8.9–10.3)
Creatinine, Ser: 0.4 mg/dL — ABNORMAL LOW (ref 0.44–1.00)
GFR calc Af Amer: >60 mL/min (ref >60)
GFR calc non Af Amer: >60 mL/min (ref >60)
GLUCOSE: 88 mg/dL (ref 70–99)
POTASSIUM: 3.8 mmol/L (ref 3.5–5.1)
Sodium: 142 mmol/L (ref 135–145)

## 2017-11-03 MED ORDER — FUROSEMIDE 10 MG/ML IJ SOLN
INTRAMUSCULAR | Status: AC
  Filled 2017-11-03: qty 2

## 2017-11-03 MED ORDER — METOCLOPRAMIDE HCL 5 MG/ML IJ SOLN
5.0000 mg | Freq: Four times a day (QID) | INTRAMUSCULAR | Status: DC
  Administered 2017-11-03 – 2017-11-06 (×13): 5 mg via INTRAVENOUS
  Filled 2017-11-03 (×13): qty 2

## 2017-11-03 MED ORDER — MORPHINE SULFATE (PF) 2 MG/ML IV SOLN
1.0000 mg | Freq: Once | INTRAVENOUS | Status: AC
  Administered 2017-11-03: 1 mg via INTRAVENOUS

## 2017-11-03 MED ORDER — FUROSEMIDE 10 MG/ML IJ SOLN: 20.0000 mg | Freq: Two times a day (BID) | INTRAMUSCULAR | Status: DC

## 2017-11-03 MED ORDER — FUROSEMIDE 10 MG/ML IJ SOLN
20.0000 mg | Freq: Two times a day (BID) | INTRAMUSCULAR | Status: DC
  Administered 2017-11-03 – 2017-11-06 (×6): 20 mg via INTRAVENOUS
  Filled 2017-11-03 (×6): qty 2

## 2017-11-03 MED ORDER — MORPHINE SULFATE (PF) 2 MG/ML IV SOLN
INTRAVENOUS | Status: AC
  Filled 2017-11-03: qty 1

## 2017-11-03 MED ORDER — FUROSEMIDE 10 MG/ML IJ SOLN
20.0000 mg | Freq: Once | INTRAMUSCULAR | Status: AC
  Administered 2017-11-03: 20 mg via INTRAVENOUS

## 2017-11-03 MED ORDER — FUROSEMIDE 10 MG/ML IJ SOLN: 20.0000 mg | Freq: Once | INTRAMUSCULAR | Status: DC

## 2017-11-03 NOTE — Progress Notes (Signed)
Danielle Stevens  Danielle Stevens  MRN:[id number] DOB: 12/06/1970 DOA: 10/26/2017 PCP: Patient, No Pcp Per    Brief Narrative:  46yo F former smoker from [facility] with L sided chest pain and acute respiratory failure who was found to have a spontaneous PTX.  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  She was in hospital from 10/14/17 to 10/20/17 with aspiration pneumonia with Enterobacter bacteremia, Lt lung PE on eliquis.  Significant Events: 6/30 Admit, TCTS consulted, fever 101.3 - L inferior (pigtial - per EDP) and anterior (full size - per TCTS) chest tubes 7/01 TCTS s/o 7/2 [facility] assumed care  7/5 anterior chest tube removed   Subjective: The patient herself actually looks great this morning.  She is resting comfortably in bed.  She denies chest pain shortness of breath nausea or vomiting.  She continues however to produce a significant amount of what appears entirely consistent with tube feeding via her trach.  Her trach circuit has been changed multiple times because of this issue.  Nonetheless she does not appear to be in any acute respiratory distress.  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 not transplant candidate per [facility] and [facility] - cont weekly Prolastin as pt is not willing to consider stopping it - full vent support per PCCM - no plans to attempt vent wean   L spontaneous pneumothorax anterior chest tube placed by TCTS, and inferior pigtail placed by EDP - all chest tubes now out per PCCM and stable   Suspected Reflux w/ silent aspiration of tube feeds  No vomiting occurring but trach / vent tubing clearly stained w/ tube feeding still today, despite having stopped tube feeding yesterday - check KUB to r/o ileus - cont reglan - clinically stable   Hx of PE (acute  June 2019) continue eliquis for 6 months (through November) then lifelong pharmacologic prophylaxis thereafter  Chronic hypotension continue midodrine - BP stable   Pseudomonas HCAP  Cont merem to complete a 10 day tx course   Severe anxiety, RLS scheduled klonopin, paxil, seroquel, requip - anxiety very well controlled at this time   Chronic multifactorial normocytic anemia Hgb stable at this time - no evidence of bleeding on anticoag - follow trend   DM2 controlled without complication 5/17 A1c 5.1 - CBGwellcontrolled   Diarrhea C diff negative during hospital stay late June   DVT prophylaxis: Eliquis  Code Status: FULL CODE Family Communication: no family present  Disposition Plan: ICU on chronic vent   Consultants:  PCCM TCTS  Antimicrobials:  Vancomycin 6/30  Cefepime 6/30  Fortaz 7/01 > 7/02 Merem 7/2 >   Objective: Blood pressure (!) 97/59, pulse (!) 106, temperature 98.9 F (37.2 C), temperature source Oral, resp. rate 15, height 5' 4" (1.626 m), weight 96.4 kg (212 lb 8.4 oz), SpO2 95 %.  Intake/Output Summary (Last 24 hours) at 11/03/2017 0944 Last data filed at 11/03/2017 0900 Gross per 24 hour  Intake 703.63 ml  Output 1550 ml  Net -846.37 ml   Filed Weights   11/01/17 0346 11/02/17 0500 11/03/17 0500  Weight: 96.6 kg (212 lb 15.4 oz) 96.8 kg (213 lb 6.5 oz) 96.4 kg (212 lb 8.4 oz)    Examination: General: No acute respiratory distress - alert and smiling  Lungs: very poor air movement L base chronically - no wheezing  Cardiovascular:   RRR - no M or rub  Abdomen: NT/ND, soft, bs+, PEG insertion clean and dry Extremities: 1+ anasarca    CBC: Recent Labs  Lab 10/31/17 0336 11/02/17 0301 11/03/17 0455  WBC 19.3* 12.5* 22.6*  HGB 8.8* 7.9* 9.4*  HCT 31.4* 27.8* 32.9*  MCV 83.1 83.5 81.8  PLT 226 231 303   Basic Metabolic Panel: Recent Labs  Lab 10/27/17 1556 10/28/17 0508  10/30/17 0906 10/31/17 0336 11/02/17 0301 11/03/17 0455   NA  --  135   < > 137 137 139 142  K  --  3.6   < > 3.6 4.0 4.0 3.8  CL  --  94*   < > 95* 95* 101 98  CO2  --  32   < > 32 32 32 38*  GLUCOSE  --  162*   < > 175* 126* 81 88  BUN  --  32*   < > 18 16 12 9  CREATININE  --  0.63   < > 0.44 0.37* 0.34* 0.40*  CALCIUM  --  8.9   < > 8.3* 8.5* 8.3* 8.7*  MG 1.8 1.8  --  1.7 1.8  --   --   PHOS 3.0 2.9  --   --  3.1  --   --    < > = values in this interval not displayed.   GFR: Estimated Creatinine Clearance: 96.9 mL/min (A) (by C-G formula based on SCr of 0.4 mg/dL (L)).  Liver Function Tests: Recent Labs  Lab 10/29/17 0303 11/02/17 0301  AST 34 30  ALT 46* 34  ALKPHOS 80 86  BILITOT 0.7 0.7  PROT 4.9* 5.0*  ALBUMIN 2.1* 2.0*    HbA1C: Hgb A1c MFr Bld  Date/Time Value Ref Range Status  09/12/2017 05:17 AM 5.1 4.8 - 5.6 % Final    Comment:    (NOTE) Pre diabetes:          5.7%-6.4% Diabetes:              >6.4% Glycemic control for   <7.0% adults with diabetes     CBG: Recent Labs  Lab 11/02/17 1617 11/02/17 1927 11/02/17 2310 11/03/17 0320 11/03/17 0804  GLUCAP 87 86 83 95 92    Recent Results (from the past 240 hour(s))  Blood culture (routine x 2)     Status: None   Collection Time: 10/26/17 10:50 AM  Result Value Ref Range Status   Specimen Description BLOOD RIGHT ARM  Final   Special Requests   Final    BOTTLES DRAWN AEROBIC AND ANAEROBIC Blood Culture adequate volume   Culture   Final    NO GROWTH 5 DAYS Performed at [facility], [address] [place], [place] [address]    Report Status 10/31/2017 FINAL  Final  MRSA PCR Screening     Status: None   Collection Time: 10/26/17  2:42 PM  Result Value Ref Range Status   MRSA by PCR NEGATIVE NEGATIVE Final    Comment:        The GeneXpert MRSA Assay (FDA approved for NASAL specimens only), is one component of a comprehensive MRSA colonization surveillance program. It is not intended to diagnose MRSA infection nor to guide or monitor  treatment for MRSA infections. Performed at [facility], [address] [place], [place] [address]   Culture, respiratory     Status: None   Collection Time: 10/26/17  4:33 PM  Result Value Ref Range   Status   Specimen Description TRACHEAL ASPIRATE  Final   Special Requests NONE  Final   Gram Stain   Final    RARE WBC PRESENT,BOTH PMN AND MONONUCLEAR NO SQUAMOUS EPITHELIAL CELLS SEEN RARE GRAM POSITIVE COCCI IN PAIRS RARE GRAM POSITIVE RODS Performed at [facility], [place] [address], [place], [place] [address]    Culture MODERATE PSEUDOMONAS AERUGINOSA  Final   Report Status 10/28/2017 FINAL  Final   Organism ID, Bacteria PSEUDOMONAS AERUGINOSA  Final      Susceptibility   Pseudomonas aeruginosa - MIC*    CEFTAZIDIME 16 INTERMEDIATE Intermediate     CIPROFLOXACIN >=4 RESISTANT Resistant     GENTAMICIN <=1 SENSITIVE Sensitive     IMIPENEM 2 SENSITIVE Sensitive     CEFEPIME INTERMEDIATE Intermediate     * MODERATE PSEUDOMONAS AERUGINOSA     Scheduled Meds: . albuterol  2.5 mg Nebulization Q6H  . apixaban  5 mg Per Tube BID  . budesonide  0.5 mg Nebulization BID  . chlorhexidine gluconate (MEDLINE KIT)  15 mL Mouth Rinse BID  . clonazePAM  1 mg Per Tube Q8H  . famotidine  20 mg Per Tube Q12H  . furosemide  20 mg Intravenous Once  . insulin aspart  0-9 Units Subcutaneous Q4H  . mouth rinse  15 mL Mouth Rinse 10 times per day  . metoCLOPramide  5 mg Per Tube TID  . midodrine  5 mg Per Tube TID WC  . pantoprazole sodium  40 mg Per Tube Daily  . PARoxetine  30 mg Per Tube Daily  . polyethylene glycol  17 g Per Tube Daily  . predniSONE  20 mg Per Tube Q breakfast  . QUEtiapine  100 mg Per Tube BID  . rOPINIRole  0.5 mg Per Tube QHS   Continuous Infusions: . sodium chloride 10 mL/hr at 11/03/17 0900  . alpha-1 proteinase inhibitor (PROLASTIN-C) infusion Stopped (10/31/17 1029)  . meropenem (MERREM) IV Stopped (11/03/17 5409)     LOS: 8 days   Cherene Altes, MD [facility] Office  [phone] Pager - Text Page per Amion as per below:  On-Call/Text Page:      [email or web address]      password [email or web address]  If 7PM-7AM, please contact night-coverage [email or web address] Password [email or web address] 11/03/2017, 9:44 AM

## 2017-11-03 NOTE — Progress Notes (Addendum)
Nutrition Follow-up  DOCUMENTATION CODES:   Obesity unspecified  INTERVENTION:    Consider converting PEG into a G-J tube to decrease risk of aspiration.  When able to resume TF, recommend Vital High Protein at 50 ml/h to provide 1200 kcal, 105 gm protein, 1003 ml free water daily.  NUTRITION DIAGNOSIS:   Inadequate oral intake related to inability to eat as evidenced by NPO status.  Ongoing  GOAL:   Provide needs based on ASPEN/SCCM guidelines  Unmet, TF off  MONITOR:   Vent status, TF tolerance, Labs, I & O's  ASSESSMENT:   47 yo female with PMH of COPD, trach, PEG, alpha-1-antitrypsin deficiency, HTN, DM who was admitted on 6/30 with a spontaneous PTX.  Discussed patient with RN today. TF on hold since 7/6 due to suspected aspiration. Patient has a history of recurrent aspiration PNA. Per RN, patient has what appears to be tube feeding coming out of her trach. Abdominal film today showed no obstruction or acute abdominal process. Left lung consolidation remains the same. Reglan started today.  Patient remains on ventilator support MV: 10.6 L/min Temp (24hrs), Avg:98.4 F (36.9 C), Min:97.7 F (36.5 C), Max:99.2 F (37.3 C)   Labs and medications reviewed. I/O +8.7 L since admission, weight above admission weight.  Diet Order:   Diet Order           Diet NPO time specified Except for: Ice Chips  Diet effective now          EDUCATION NEEDS:   No education needs have been identified at this time  Skin:  Skin Assessment: Skin Integrity Issues: Skin Integrity Issues:: Other (Comment) Other: MASD to groin, labia, & perineum  Last BM:  7/8 (type 7) rectal tube removed 7/6  Height:   Ht Readings from Last 1 Encounters:  10/28/17 5\' 4"  (1.626 m)    Weight:   Wt Readings from Last 1 Encounters:  11/03/17 212 lb 8.4 oz (96.4 kg)    Ideal Body Weight:  54.5 kg  BMI:  Body mass index is 36.48 kg/m.  Estimated Nutritional Needs:   Kcal:   1060-1350  Protein:  109 gm  Fluid:  1.5 L    Joaquin Courts, RD, LDN, CNSC Pager [phone] After Hours Pager [phone]

## 2017-11-03 NOTE — Progress Notes (Signed)
eLink Physician-Brief Progress Note Patient Name: Danielle Stevens DOB: 1971-04-13 MRN: [id number]   Date of Service  11/03/2017  HPI/Events of Note  Hypoxia - Sat decreased to 93%. Patient suctioned and given neb Rx and still claims to be SOB.   eICU Interventions  Will order: 1. Increase PEEP to 8. 2. Portable CXR STAT. 3. ABG STAT. 4. Will ask ground team to evaluate at bedside.      Intervention Category Major Interventions: Respiratory failure - evaluation and management;Hypoxemia - evaluation and management  Halden Phegley Dennard Nip 11/03/2017, 5:35 AM

## 2017-11-03 NOTE — Progress Notes (Signed)
Physical Therapy Treatment Patient Details Name: Danielle Stevens MRN: [id number] DOB: May 12, 1970 Today's Date: 11/03/2017    History of Present Illness This 47 y.o. female admitted from [facility] (where she has been for several months ) for Lt sided chest pain, Respiratory failure due to spontaneous pneumothorax.  She unerwent placement of chest tube.  PMH includes:  chronic hypoxic/hypercapnic respiratory failure with A!AT deficiency with severe COPD/emphysema (not a candidate for a lung transplant), s/p trach and PEG     PT Comments    Pt admitted with above diagnosis. Pt currently with functional limitations due to balance and endurance deficits. Pt was able to sit EOB for 2 minutes with mod assist of 2 persons.  Anxiety limits pt but she did do a little more today than last treatment.  Will follow acutely.  Pt will benefit from skilled PT to increase their independence and safety with mobility to allow discharge to the venue listed below.     Follow Up Recommendations  LTACH(from [facility])     Equipment Recommendations  None recommended by PT    Recommendations for Other Services       Precautions / Restrictions Precautions Precautions: Fall Precaution Comments: trach, PEG Restrictions Weight Bearing Restrictions: No    Mobility  Bed Mobility Overal bed mobility: Needs Assistance Bed Mobility: Rolling;Sidelying to Sit Rolling: Max assist;+2 for physical assistance Sidelying to sit: Max assist;+2 for physical assistance       General bed mobility comments: Pt was able to come to sitting with assist for LEs, assist for elevation of trunk and use of pad to bring pt to EOB.   Transfers                    Ambulation/Gait                 Stairs             Wheelchair Mobility    Modified Rankin (Stroke Patients Only)       Balance Overall balance assessment: Needs assistance Sitting-balance support: Feet unsupported;Bilateral upper extremity  supported Sitting balance-Leahy Scale: Poor Sitting balance - Comments: mod assist to sit EOB for 2 minutes.  Pt incr anxiety with sitting and needed max encouragement to sit EOB with trach popping off a few times with copious sputum and had to reattach.   Postural control: Posterior lean                                  Cognition Arousal/Alertness: Awake/alert Behavior During Therapy: Anxious Overall Cognitive Status: Within Functional Limits for tasks assessed                                        Exercises Total Joint Exercises Ankle Circles/Pumps: AROM;Both;5 reps;Supine Long Arc Quad: AROM;Both;5 reps;Seated    General Comments General comments (skin integrity, edema, etc.): Vent settings PEEP 5, 50% FiO2, RR 24-32 with activity.  HR 107 bpm, BP 96/68 initially and 117/78 in sitting.  94% O2 sat.        Pertinent Vitals/Pain Pain Assessment: Faces Faces Pain Scale: Hurts little more Pain Location: generalized Pain Descriptors / Indicators: Grimacing;Guarding Pain Intervention(s): Limited activity within patient's tolerance;Monitored during session;Repositioned    Home Living  Prior Function            PT Goals (current goals can now be found in the care plan section) Progress towards PT goals: Progressing toward goals    Frequency    Min 2X/week      PT Plan Current plan remains appropriate    Co-evaluation              AM-PAC PT "6 Clicks" Daily Activity  Outcome Measure  Difficulty turning over in bed (including adjusting bedclothes, sheets and blankets)?: Unable Difficulty moving from lying on back to sitting on the side of the bed? : Unable Difficulty sitting down on and standing up from a chair with arms (e.g., wheelchair, bedside commode, etc,.)?: Unable Help needed moving to and from a bed to chair (including a wheelchair)?: Total Help needed walking in hospital room?: Total Help  needed climbing 3-5 steps with a railing? : Total 6 Click Score: 6    End of Session Equipment Utilized During Treatment: Oxygen;Other (comment)(vent FiO2 50% PEEP 5) Activity Tolerance: Patient tolerated treatment well;Other (comment)(self limiting by anxiety) Patient left: Other (comment);in bed;with call bell/phone within reach(in chair mode in bed with head up high) Nurse Communication: Mobility status PT Visit Diagnosis: Other abnormalities of gait and mobility (R26.89);Muscle weakness (generalized) (M62.81);Pain Pain - Right/Left: Left Pain - part of body: (chest tube site)     Time: 1001-1015 PT Time Calculation (min) (ACUTE ONLY): 14 min  Charges:  $Therapeutic Activity: 8-22 mins                    G Codes:       Leeann Bady Raczynski,PT Acute Rehabilitation [phone] (pager)    Berline Lopes 11/03/2017, 11:04 AM

## 2017-11-03 NOTE — Progress Notes (Signed)
Full vent support resp failure, spontaneous ptx, a1at deficiency with severe copd/emphysema, conts with tube feeding coming out of trach, no plans to attempt to wean. Checking kub for ileus. Plan to return to [facility] when stable, on iv abx, iv lasix.

## 2017-11-04 DIAGNOSIS — E8801 Alpha-1-antitrypsin deficiency: Secondary | ICD-10-CM | POA: Diagnosis not present

## 2017-11-04 DIAGNOSIS — Z9911 Dependence on respirator [ventilator] status: Secondary | ICD-10-CM | POA: Diagnosis not present

## 2017-11-04 DIAGNOSIS — J449 Chronic obstructive pulmonary disease, unspecified: Secondary | ICD-10-CM | POA: Diagnosis not present

## 2017-11-04 DIAGNOSIS — Z930 Tracheostomy status: Secondary | ICD-10-CM | POA: Diagnosis not present

## 2017-11-04 DIAGNOSIS — J9311 Primary spontaneous pneumothorax: Secondary | ICD-10-CM | POA: Diagnosis not present

## 2017-11-04 LAB — GLUCOSE, CAPILLARY
GLUCOSE-CAPILLARY: 77 mg/dL (ref 70–99)
GLUCOSE-CAPILLARY: 97 mg/dL (ref 70–99)
GLUCOSE-CAPILLARY: 91 mg/dL (ref 70–99)
Glucose-Capillary: 76 mg/dL (ref 70–99)
Glucose-Capillary: 92 mg/dL (ref 70–99)
Glucose-Capillary: 78 mg/dL (ref 70–99)

## 2017-11-04 LAB — BASIC METABOLIC PANEL
Anion gap: 5 (ref 5–15)
BUN: 9 mg/dL (ref 6–20)
CHLORIDE: 95 mmol/L — AB (ref 98–111)
CO2: 41 mmol/L — ABNORMAL HIGH (ref 22–32)
Calcium: 8.6 mg/dL — ABNORMAL LOW (ref 8.9–10.3)
Creatinine, Ser: 0.56 mg/dL (ref 0.44–1.00)
GFR calc Af Amer: >60 mL/min (ref >60)
GFR calc non Af Amer: >60 mL/min (ref >60)
GLUCOSE: 84 mg/dL (ref 70–99)
Potassium: 3.6 mmol/L (ref 3.5–5.1)
Sodium: 141 mmol/L (ref 135–145)

## 2017-11-04 LAB — CBC
HCT: 27.9 % — ABNORMAL LOW (ref 36.0–46.0)
HEMOGLOBIN: 8 g/dL — AB (ref 12.0–15.0)
MCH: 23.2 pg — AB (ref 26.0–34.0)
MCHC: 28.7 g/dL — ABNORMAL LOW (ref 30.0–36.0)
MCV: 80.9 fL (ref 78.0–100.0)
Platelets: 261 10*3/uL (ref 150–400)
RBC: 3.45 MIL/uL — AB (ref 3.87–5.11)
RDW: 17.2 % — ABNORMAL HIGH (ref 11.5–15.5)
WBC: 15.2 10*3/uL — ABNORMAL HIGH (ref 4.0–10.5)

## 2017-11-04 MED ORDER — SODIUM CHLORIDE 0.9 % IV BOLUS
250.0000 mL | Freq: Once | INTRAVENOUS | Status: AC
  Administered 2017-11-04: 250 mL via INTRAVENOUS

## 2017-11-04 MED ORDER — ALBUTEROL SULFATE (2.5 MG/3ML) 0.083% IN NEBU
2.5000 mg | INHALATION_SOLUTION | RESPIRATORY_TRACT | Status: DC | PRN
  Administered 2017-11-04 – 2017-11-06 (×3): 2.5 mg via RESPIRATORY_TRACT
  Filled 2017-11-04 (×4): qty 3

## 2017-11-04 NOTE — Progress Notes (Signed)
Per Pt's request, HME (double HME per her request), Omni flex, and ballard changed due to being soiled. Pt requested Vent tubing be changed as well but it does not appear to be soiled currently, but instead possibly stained from a previous soiling. Discussed with pt that does not need to be changed at this time and she is in agreement. Pt also requesting PRN Albuterol tx as she states its hard for her to breathe. RT ordered PRN txs to ensure they are available when she needs them

## 2017-11-04 NOTE — Care Management Note (Addendum)
Case Management Note  Patient Details  Name: Danielle Stevens MRN: [id number] Date of Birth: 1970/07/11  Subjective/Objective:   NCM spoke with patient and her daughter , Joice Lofts , plan is to get her to [facility] at [facility] and then bridge her over to the SNF.  MD notified that patient and daughter in agreement.  NCM checking with Irving Burton to see if this can be done today.  Irving Burton states she just ran [employer] and she does not have any ltach days so she is not able to take her to ltach.  CSW updated and daughter and MD.               Action/Plan: DC to [facility] LTACH when ready.  Expected Discharge Date:                  Expected Discharge Plan:  Long Term Acute Care (LTAC)  In-House Referral:  Clinical Social Work  Discharge planning Services  CM Consult  Post Acute Care Choice:    Choice offered to:     DME Arranged:    DME Agency:     HH Arranged:    HH Agency:     Status of Service:  Completed, signed off  If discussed at [employer], dates discussed:    Additional Comments:  Leone Haven, RN 11/04/2017, 9:21 AM

## 2017-11-04 NOTE — Progress Notes (Signed)
[facility] TEAM 1 - Stepdown/ICU TEAM  Danielle Stevens  ZYS:[id number] DOB: [birth year] DOA: 10/26/2017 PCP: Danielle Stevens, No Pcp Per    Brief Narrative:  47yo F former smoker from [place] with L sided chest pain and acute respiratory failure who was found to have a spontaneous PTX.  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  She was in hospital from 10/14/17 to 10/20/17 with aspiration pneumonia with Enterobacter bacteremia, Lt lung PE on eliquis.  Significant Events: 6/30 Admit, TCTS consulted, fever 101.3 - L inferior (pigtial - per EDP) and anterior (full size - per TCTS) chest tubes 7/01 TCTS s/o 7/2 TRH assumed care  7/5 anterior chest tube removed   Subjective: The Danielle Stevens is in good spirits.  She is resting comfortably in bed.  She is awake alert conversant and even cheerful.  She denies nausea vomiting abdominal pain shortness of breath or cough.  The tannish staining of her tracheal secretions is significantly diminished today but does persist to a lesser extent.  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 and [facility] - cont weekly Prolastin as pt is not willing to consider stopping it - full vent support per PCCM - no plans to attempt vent wean   L spontaneous pneumothorax anterior chest tube placed by TCTS, and inferior pigtail placed by EDP - all chest tubes now out per PCCM and stable   Suspected Reflux w/ silent aspiration of tube feeds  No vomiting occurring but trach / vent tubing clearly stained w/ tube feeding still today though to signif less extent - tube feeding stopped 7/7 - KUB w/o evidence of ileus/GOO/etc - cont reglan - clinically stable - placing PEG to low intermittent suction did not produce signif output - hold tube feeds another 24hrs and attempt  to resume at low rate 7/10 - if recurs may have to evaluate for possible fistula tract    Hx of PE (acute June 2019) continue eliquis for 6 months (through November) then lifelong pharmacologic prophylaxis thereafter  Chronic hypotension continue midodrine - BP stable   Pseudomonas HCAP  Cont merem to complete a 10 day tx course (today is day 8)    Severe anxiety, RLS scheduled klonopin, paxil, seroquel, requip - anxiety well controlled presently    Chronic multifactorial normocytic anemia Hgb stable at this time - no evidence of bleeding on anticoag - follow trend   DM2 controlled without complication 9/32 T5T 5.1 - CBGwellcontrolled   Diarrhea C diff negative during hospital stay late June   DVT prophylaxis: Eliquis  Code Status: FULL CODE Family Communication: no family present  Disposition Plan: ICU on chronic vent   Consultants:  PCCM TCTS  Antimicrobials:  Vancomycin 6/30  Cefepime 6/30  Fortaz 7/01 > 7/02 Merem 7/2 >   Objective: Blood pressure (!) 108/57, pulse 90, temperature 98.4 F (36.9 C), temperature source Oral, resp. rate (!) 22, height '5\' 4"'$  (1.626 m), weight 91.6 kg (201 lb 15.1 oz), SpO2 95 %.  Intake/Output Summary (Last 24 hours) at 11/04/2017 1021 Last data filed at 11/04/2017 0800 Gross per 24 hour  Intake 677.75 ml  Output 1400 ml  Net -722.25 ml   Filed Weights   11/02/17 0500 11/03/17 0500 11/04/17 0500  Weight: 96.8 kg (213 lb 6.5 oz) 96.4 kg (212 lb 8.4 oz) 91.6 kg (201 lb 15.1 oz)  Examination: General: No acute respiratory distress - alert - pleasant  Lungs: very poor air movement L base chronically - no acute wheeze or crackles  Cardiovascular: RRR  Abdomen: NT/ND, soft, bs+, PEG insertion remains clean and dry Extremities: trace anasarca    CBC: Recent Labs  Lab 11/02/17 0301 11/03/17 0455 11/04/17 0342  WBC 12.5* 22.6* 15.2*  HGB 7.9* 9.4* 8.0*  HCT 27.8* 32.9* 27.9*  MCV 83.5 81.8 80.9  PLT 231 303 009    Basic Metabolic Panel: Recent Labs  Lab 10/30/17 0906 10/31/17 0336 11/02/17 0301 11/03/17 0455 11/04/17 0342  NA 137 137 139 142 141  K 3.6 4.0 4.0 3.8 3.6  CL 95* 95* 101 98 95*  CO2 32 32 32 38* 41*  GLUCOSE 175* 126* 81 88 84  BUN _0 CREATININE 0.44 0.37* 0.34* 0.40* 0.56  CALCIUM 8.3* 8.5* 8.3* 8.7* 8.6*  MG 1.7 1.8  --   --   --   PHOS  --  3.1  --   --   --    GFR: Estimated Creatinine Clearance: 94.4 mL/min (by C-G formula based on SCr of 0.56 mg/dL).  Liver Function Tests: Recent Labs  Lab 10/29/17 0303 11/02/17 0301  AST 34 30  ALT 46* 34  ALKPHOS 80 86  BILITOT 0.7 0.7  PROT 4.9* 5.0*  ALBUMIN 2.1* 2.0*    HbA1C: Hgb A1c MFr Bld  Date/Time Value Ref Range Status  09/12/2017 05:17 AM 5.1 4.8 - 5.6 % Final    Comment:    (NOTE) Pre diabetes:          5.7%-6.4% Diabetes:              >6.4% Glycemic control for   <7.0% adults with diabetes     CBG: Recent Labs  Lab 11/03/17 1601 11/03/17 1955 11/03/17 2355 11/04/17 0404 11/04/17 0734  GLUCAP 107* 90 89 77 76    Recent Results (from the past 240 hour(s))  Blood culture (routine x 2)     Status: None   Collection Time: 10/26/17 10:50 AM  Result Value Ref Range Status   Specimen Description BLOOD RIGHT ARM  Final   Special Requests   Final    BOTTLES DRAWN AEROBIC AND ANAEROBIC Blood Culture adequate volume   Culture   Final    NO GROWTH 5 DAYS Performed at [facility], [place] [address], [place], [place] [address]    Report Status 10/31/2017 FINAL  Final  MRSA PCR Screening     Status: None   Collection Time: 10/26/17  2:42 PM  Result Value Ref Range Status   MRSA by PCR NEGATIVE NEGATIVE Final    Comment:        The GeneXpert MRSA Assay (FDA approved for NASAL specimens only), is one component of a comprehensive MRSA colonization surveillance program. It is not intended to diagnose MRSA infection nor to guide or monitor treatment for MRSA  infections. Performed at [facility], [place] [address], [place], [place] [address]   Culture, respiratory     Status: None   Collection Time: 10/26/17  4:33 PM  Result Value Ref Range Status   Specimen Description TRACHEAL ASPIRATE  Final   Special Requests NONE  Final   Gram Stain   Final    RARE WBC PRESENT,BOTH PMN AND MONONUCLEAR NO SQUAMOUS EPITHELIAL CELLS SEEN RARE GRAM POSITIVE COCCI IN PAIRS RARE GRAM POSITIVE RODS Performed at [facility],  [address] [place], [place] [address]    Culture MODERATE PSEUDOMONAS AERUGINOSA  Final   Report Status 10/28/2017 FINAL  Final   Organism ID, Bacteria PSEUDOMONAS AERUGINOSA  Final      Susceptibility   Pseudomonas aeruginosa - MIC*    CEFTAZIDIME 16 INTERMEDIATE Intermediate     CIPROFLOXACIN >=4 RESISTANT Resistant     GENTAMICIN <=1 SENSITIVE Sensitive     IMIPENEM 2 SENSITIVE Sensitive     CEFEPIME INTERMEDIATE Intermediate     * MODERATE PSEUDOMONAS AERUGINOSA     Scheduled Meds: . albuterol  2.5 mg Nebulization Q6H  . apixaban  5 mg Per Tube BID  . budesonide  0.5 mg Nebulization BID  . chlorhexidine gluconate (MEDLINE KIT)  15 mL Mouth Rinse BID  . clonazePAM  1 mg Per Tube Q8H  . famotidine  20 mg Per Tube Q12H  . furosemide  20 mg Intravenous Once  . furosemide  20 mg Intravenous Q12H  . insulin aspart  0-9 Units Subcutaneous Q4H  . mouth rinse  15 mL Mouth Rinse 10 times per day  . metoCLOPramide (REGLAN) injection  5 mg Intravenous Q6H  . midodrine  5 mg Per Tube TID WC  . pantoprazole sodium  40 mg Per Tube Daily  . PARoxetine  30 mg Per Tube Daily  . polyethylene glycol  17 g Per Tube Daily  . predniSONE  20 mg Per Tube Q breakfast  . QUEtiapine  100 mg Per Tube BID  . rOPINIRole  0.5 mg Per Tube QHS   Continuous Infusions: . sodium chloride 250 mL (11/04/17 0533)  . alpha-1 proteinase inhibitor (PROLASTIN-C) infusion Stopped (10/31/17 1029)  . meropenem (MERREM) IV 1 g (11/04/17 0534)      LOS: 9 days   Cherene Altes, MD [facility] Office  [phone] Pager - Text Page per Amion as per below:  On-Call/Text Page:      [email or web address]      password [email or web address]  If 7PM-7AM, please contact night-coverage [email or web address] Password [email or web address] 11/04/2017, 10:21 AM

## 2017-11-05 ENCOUNTER — Inpatient Hospital Stay (HOSPITAL_COMMUNITY): Payer: Medicare Other

## 2017-11-05 DIAGNOSIS — J9621 Acute and chronic respiratory failure with hypoxia: Secondary | ICD-10-CM | POA: Diagnosis not present

## 2017-11-05 DIAGNOSIS — R0902 Hypoxemia: Secondary | ICD-10-CM | POA: Diagnosis not present

## 2017-11-05 DIAGNOSIS — J9311 Primary spontaneous pneumothorax: Secondary | ICD-10-CM | POA: Diagnosis not present

## 2017-11-05 DIAGNOSIS — J449 Chronic obstructive pulmonary disease, unspecified: Secondary | ICD-10-CM | POA: Diagnosis not present

## 2017-11-05 DIAGNOSIS — J9622 Acute and chronic respiratory failure with hypercapnia: Secondary | ICD-10-CM | POA: Diagnosis not present

## 2017-11-05 DIAGNOSIS — T17908S Unspecified foreign body in respiratory tract, part unspecified causing other injury, sequela: Secondary | ICD-10-CM | POA: Diagnosis not present

## 2017-11-05 LAB — GLUCOSE, CAPILLARY
GLUCOSE-CAPILLARY: 62 mg/dL — AB (ref 70–99)
GLUCOSE-CAPILLARY: 114 mg/dL — AB (ref 70–99)
GLUCOSE-CAPILLARY: 101 mg/dL — AB (ref 70–99)
Glucose-Capillary: 109 mg/dL — ABNORMAL HIGH (ref 70–99)
Glucose-Capillary: 67 mg/dL — ABNORMAL LOW (ref 70–99)
Glucose-Capillary: 76 mg/dL (ref 70–99)
Glucose-Capillary: 82 mg/dL (ref 70–99)
Glucose-Capillary: 99 mg/dL (ref 70–99)

## 2017-11-05 MED ORDER — PRO-STAT SUGAR FREE PO LIQD
30.0000 mL | Freq: Two times a day (BID) | ORAL | Status: DC
  Administered 2017-11-05 – 2017-11-06 (×2): 30 mL
  Filled 2017-11-05 (×2): qty 30

## 2017-11-05 MED ORDER — DEXTROSE 50 % IV SOLN
INTRAVENOUS | Status: AC
  Administered 2017-11-05: 50 mL
  Filled 2017-11-05: qty 50

## 2017-11-05 MED ORDER — VITAL HIGH PROTEIN PO LIQD
1000.0000 mL | ORAL | Status: DC
  Administered 2017-11-05: 1000 mL

## 2017-11-05 MED ORDER — DEXTROSE 50 % IV SOLN
25.0000 mL | Freq: Once | INTRAVENOUS | Status: AC
  Administered 2017-11-05: 25 mL via INTRAVENOUS

## 2017-11-05 MED ORDER — DEXTROSE 50 % IV SOLN
INTRAVENOUS | Status: AC
  Filled 2017-11-05: qty 50

## 2017-11-05 NOTE — Progress Notes (Signed)
Occupational Therapy Treatment Patient Details Name: Chereen Barreca MRN: [id number] DOB: 06-Jul-1970 Today's Date: 11/05/2017    History of present illness Pt is a 47 y.o. female admitted from [facility] (where she has been for several months) due to c/o Lt sided chest pain; also with respiratory failure due to spontaneous pneumothorax. Underwent chest tube placement. PMH includes chronic hypoxic/hypercapnic respiratory failure with A!AT deficiency with severe COPD/emphysema (not a candidate for a lung transplant), s/p trach and PEG.    OT comments  Pt progressing toward goals. Pt currently requires modA for bed mobility. Pt sat EOB >5 minutes in preparation to complete ADLs while sitting EOB. Pt requested to return to supine as she felt she was "unable to breath", nursing requested to suction pt during session, although this was unproductive. Pt educated on pursed lip breathing and agreeable to sit EOB until 5 min mark. Next session to focus on pt transfer to chair, pt agreeable to this. Continue to recommend SNF follow-up. Will continue to follow acutely to maximize pt's independence and and safety with ADLs and functional mobility.    Follow Up Recommendations  SNF;Supervision/Assistance - 24 hour    Equipment Recommendations  None recommended by OT    Recommendations for Other Services      Precautions / Restrictions Precautions Precautions: Fall;Other (comment) Precaution Comments: trach, PEG Restrictions Weight Bearing Restrictions: No       Mobility Bed Mobility Overal bed mobility: Needs Assistance Bed Mobility: Supine to Sit     Supine to sit: Mod assist     General bed mobility comments: ModA for single UE support for trunk elevation; assist to scoot R hip towards EOB. ModA+2 to scoot up in bed; pt able to assist with BUEs on bed rails and pushing with BLEs  Transfers                 General transfer comment: Declining OOB mobility secondary to feeling she "cannot  breathe"    Balance Overall balance assessment: Needs assistance Sitting-balance support: Feet unsupported;Bilateral upper extremity supported Sitting balance-Leahy Scale: Fair Sitting balance - Comments: Able to sit EOB >5 minutes with BUE support on bed. Intermittent minA as pt with fear of falling. Requesting 1x suctioning by RN although non-productive Postural control: Posterior lean                                 ADL either performed or assessed with clinical judgement   ADL Overall ADL's : Needs assistance/impaired Eating/Feeding: NPO                                     General ADL Comments: Pt very anxious sitting EOB;pt sat EOB for in preparation for sitting to complete ADLs;pt educated on pursed lip breathing      Vision       Perception     Praxis      Cognition Arousal/Alertness: Awake/alert Behavior During Therapy: Anxious Overall Cognitive Status: Within Functional Limits for tasks assessed                                 General Comments: Following commands appropriately throughout session. Self-limited by anxiety and feeling she "cannot breathe"        Exercises Other Exercises Other Exercises: At rest, pt  with significant bilat hip external rotation and ankle eversion/PF; pt able to actively correct - repositioned with pillows to neutral alignment   Shoulder Instructions       General Comments Pt reported she was "not able to breath" while sitting EOB, RN suctioned pt while sitting EOB with not sputum extracted;pt educated on pursed lip breathing and feelings of axiety while sitting EOB for 5 min;pt agreeable to progressing to chair next session     Pertinent Vitals/ Pain       Pain Assessment: Faces Faces Pain Scale: Hurts a little bit Pain Location: Abdomen Pain Descriptors / Indicators: Sore;Guarding Pain Intervention(s): Monitored during session;Limited activity within patient's tolerance  Home  Living                                          Prior Functioning/Environment              Frequency  Min 2X/week        Progress Toward Goals  OT Goals(current goals can now be found in the care plan section)  Progress towards OT goals: Progressing toward goals  Acute Rehab OT Goals Patient Stated Goal: sit in chair OT Goal Formulation: With patient Time For Goal Achievement: 11/10/17 Potential to Achieve Goals: Fair ADL Goals Pt Will Perform Grooming: with set-up;with supervision;sitting Pt Will Perform Upper Body Bathing: with min assist;sitting Pt/caregiver will Perform Home Exercise Program: Increased strength;Right Upper extremity;Left upper extremity;With theraband;With minimal assist;With written HEP provided Additional ADL Goal #1: Pt will sit EOB x 5 mins with mod A in prep for ADLs  Plan Discharge plan remains appropriate    Co-evaluation    PT/OT/SLP Co-Evaluation/Treatment: Yes Reason for Co-Treatment: Complexity of the patient's impairments (multi-system involvement);Other (comment)(decreased activity tolerance) PT goals addressed during session: Mobility/safety with mobility OT goals addressed during session: Strengthening/ROM      AM-PAC PT "6 Clicks" Daily Activity     Outcome Measure   Help from another person eating meals?: Total Help from another person taking care of personal grooming?: A Lot Help from another person toileting, which includes using toliet, bedpan, or urinal?: Total Help from another person bathing (including washing, rinsing, drying)?: Total Help from another person to put on and taking off regular upper body clothing?: Total Help from another person to put on and taking off regular lower body clothing?: Total 6 Click Score: 7    End of Session Equipment Utilized During Treatment: Oxygen  OT Visit Diagnosis: Muscle weakness (generalized) (M62.81);Pain   Activity Tolerance Other (comment)(Patient limited  by apparent anxiety)   Patient Left in bed;with call bell/phone within reach;with nursing/sitter in room   Nurse Communication Mobility status        Time: 1610-9604 OT Time Calculation (min): 23 min  Charges:    Diona Browner OTS     Diona Browner 11/05/2017, 11:53 AM

## 2017-11-05 NOTE — Progress Notes (Signed)
OT Note Addendum     11/05/17 1403  OT Visit Information  Last OT Received On 11/05/17  OT General Charges  $OT Visit 1 Visit  OT Treatments  $Therapeutic Activity 8-22 mins   Fredric Mare A. Brett Albino, M.S., OTR/L Acute Rehab Department: [phone]

## 2017-11-05 NOTE — Progress Notes (Signed)
Physical Therapy Treatment Patient Details Name: Danielle Stevens MRN: [id number] DOB: [birth year] Today's Date: 11/05/2017    History of Present Illness Pt is a 47 y.o. female admitted from [facility] (where she has been for several months) due to c/o Lt sided chest pain; also with respiratory failure due to spontaneous pneumothorax. Underwent chest tube placement. PMH includes chronic hypoxic/hypercapnic respiratory failure with A!AT deficiency with severe COPD/emphysema (not a candidate for a lung transplant), s/p trach and PEG.    PT Comments    Pt progressing with mobility. Requires modA for supine<>sit transfers. Able to sit EOB >5 minutes with min guard (intermittent minA secondary to fear of falling). Pt limited by anxiety and increased WOB with movement, requiring max encouragement to allow mobility progression. Requesting suction by RN during session, although this was non-productive. Agreeable to OOB transfer to chair next session. Recommend SNF-level therapies to maximize functional mobility. Will follow acutely.   Follow Up Recommendations  SNF     Equipment Recommendations  None recommended by PT    Recommendations for Other Services       Precautions / Restrictions Precautions Precautions: Fall;Other (comment) Precaution Comments: trach, PEG Restrictions Weight Bearing Restrictions: No    Mobility  Bed Mobility Overal bed mobility: Needs Assistance Bed Mobility: Supine to Sit     Supine to sit: Mod assist     General bed mobility comments: ModA for single UE support for trunk elevation; assist to scoot R hip towards EOB. ModA+2 to scoot up in bed; pt able to assist with BUEs on bed rails and pushing with BLEs  Transfers                 General transfer comment: Declining OOB mobility secondary to feeling she "cannot breathe"  Ambulation/Gait                 Stairs             Wheelchair Mobility    Modified Rankin (Stroke Patients  Only)       Balance Overall balance assessment: Needs assistance Sitting-balance support: Feet unsupported;Bilateral upper extremity supported Sitting balance-Leahy Scale: Fair Sitting balance - Comments: Able to sit EOB >5 minutes with BUE support on bed. Intermittent minA as pt with fear of falling. Requesting 1x suctioning by RN although non-productive                                    Cognition Arousal/Alertness: Awake/alert Behavior During Therapy: Anxious Overall Cognitive Status: Within Functional Limits for tasks assessed                                 General Comments: Following commands appropriately throughout session. Self-limited by anxiety and feeling she "cannot breathe"      Exercises Other Exercises Other Exercises: At rest, pt with significant bilat hip external rotation and ankle eversion/PF; pt able to actively correct - repositioned with pillows to neutral alignment    General Comments        Pertinent Vitals/Pain Pain Assessment: Faces Faces Pain Scale: Hurts a little bit Pain Location: Abdomen Pain Descriptors / Indicators: Sore;Guarding Pain Intervention(s): Monitored during session;Limited activity within patient's tolerance    Home Living                      Prior Function  PT Goals (current goals can now be found in the care plan section) Acute Rehab PT Goals Patient Stated Goal: breathe better PT Goal Formulation: With patient Time For Goal Achievement: 11/10/17 Potential to Achieve Goals: Fair Progress towards PT goals: Progressing toward goals    Frequency    Min 2X/week      PT Plan Current plan remains appropriate    Co-evaluation PT/OT/SLP Co-Evaluation/Treatment: Yes Reason for Co-Treatment: Complexity of the patient's impairments (multi-system involvement)(Decreased activity tolerance) PT goals addressed during session: Mobility/safety with mobility        AM-PAC  PT "6 Clicks" Daily Activity  Outcome Measure  Difficulty turning over in bed (including adjusting bedclothes, sheets and blankets)?: Unable Difficulty moving from lying on back to sitting on the side of the bed? : Unable Difficulty sitting down on and standing up from a chair with arms (e.g., wheelchair, bedside commode, etc,.)?: Unable Help needed moving to and from a bed to chair (including a wheelchair)?: A Lot Help needed walking in hospital room?: Total Help needed climbing 3-5 steps with a railing? : Total 6 Click Score: 7    End of Session Equipment Utilized During Treatment: Oxygen Activity Tolerance: Other (comment)(self-limiting by anxiety) Patient left: in bed;with call bell/phone within reach;with nursing/sitter in room;with SCD's reapplied;Other (comment)(BLEs repositioned to neutral) Nurse Communication: Mobility status PT Visit Diagnosis: Other abnormalities of gait and mobility (R26.89);Muscle weakness (generalized) (M62.81);Pain Pain - Right/Left: Left     Time: 1093-2355 PT Time Calculation (min) (ACUTE ONLY): 23 min  Charges:  $Therapeutic Activity: 8-22 mins                    G Codes:      [name], PT, DPT Acute Rehab Services  Pager: [phone]  [name] 11/05/2017, 11:24 AM

## 2017-11-05 NOTE — Progress Notes (Signed)
[facility]  PROGRESS NOTE  Danielle Stevens MRN:[id number] DOB: 05/25/1970 DOA: 10/26/2017 PCP: Patient, No Pcp Per   Brief HPI:   47yo F former smoker from [facility] with L sided chest pain and acute respiratory failure who was found to have a spontaneous PTX. 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 She was in hospital from 10/14/17 to 10/20/17 with aspiration pneumonia with Enterobacter bacteremia, Lt lung PE on eliquis.   Subjective   Patient seen and examined, denies chest pain or shortness of breath.   Assessment/Plan:     1. Acute on chronic hypoxic/hypercapnic respiratory failure-due to severe COPD, alpha 1 antitrypsin deficiency, chronic tracheostomy status, chronic vent dependent not a transplant candidate per [facility] and [facility].  Only weekly Prolastin as patient is not willing to consider stopping it, full vent support per PCCM. 2. Left spontaneous pneumothorax -anterior chest tube placed by TC TS and inferior pigtail placed by EDP all chest tubes now out per PCCM and stable. 3. Suspected reflux with silent aspiration of tube feeds-questionable aspiration of tube feeds when tubing was stained with tube feeds, KUB on 7 7 without evidence of ileus or gastric outlet obstruction.  Patient empirically on Reglan.  Tube feeds have been on hold and she continues to have the secretions.  PCCM does not feel that patient is having aspiration from the tube feeds.  Tube feeds have been restarted 4. History of pulmonary embolism-continue Eliquis for 6 months through November than lifelong pharmacologic prophylaxis thereafter. 5. Pseudomonas healthcare associated pneumonia-continue meropenem to complete 10-day course today's day #9, 6. Severe anxiety, RLS-continue scheduled Klonopin, Paxil, Seroquel, Requip.  Anxiety is well controlled at this time. 7. Chronic multifactorial normocytic  anemia-hemoglobin stable no evidence of bleeding on anticoagulation.  Follow CBC in a.m. 8. Diabetes mellitus without complication-CBG well controlled, hemoglobin A1c as of 09/12/2017 was 5.1 9. Diarrhea-C. difficile negative during hospital stay late June.    DVT prophylaxis: Eliquis  Code Status: Full code  Family Communication: No family at bedside  Disposition Plan: likely home when medically ready for discharge   Consultants:  PCCM  Procedures:  None   Antibiotics:   Anti-infectives (From admission, onward)   Start     Dose/Rate Route Frequency Ordered Stop   10/28/17 1400  meropenem (MERREM) 1 g in sodium chloride 0.9 % 100 mL IVPB     1 g 200 mL/hr over 30 Minutes Intravenous Every 8 hours 10/28/17 1349 11/06/17 2359   10/27/17 1545  cefTAZidime (FORTAZ) 2 g in sodium chloride 0.9 % 100 mL IVPB  Status:  Discontinued     2 g 200 mL/hr over 30 Minutes Intravenous Every 8 hours 10/27/17 1536 10/28/17 1342   10/26/17 2100  vancomycin (VANCOCIN) IVPB 1000 mg/200 mL premix  Status:  Discontinued     1,000 mg 200 mL/hr over 60 Minutes Intravenous Every 8 hours 10/26/17 1058 10/27/17 0847   10/26/17 0930  ceFEPIme (MAXIPIME) 2 g in sodium chloride 0.9 % 100 mL IVPB     2 g 200 mL/hr over 30 Minutes Intravenous  Once 10/26/17 0926 10/26/17 1157   10/26/17 0930  vancomycin (VANCOCIN) 1,750 mg in sodium chloride 0.9 % 500 mL IVPB     1,750 mg 250 mL/hr over 120 Minutes Intravenous  Once 10/26/17 0928 10/26/17 1308       Objective   Vitals:   11/05/17 1534 11/05/17 1600 11/05/17 1700 11/05/17 1800  BP:  113/67   93/61 (!) 94/58  Pulse:  92 72 70  Resp:  (!) 30 19 (!) 24  Temp: 99.2 F (37.3 C)     TempSrc: Oral     SpO2:  93% 97% 96%  Weight:      Height:        Intake/Output Summary (Last 24 hours) at 11/05/2017 1908 Last data filed at 11/05/2017 1841 Gross per 24 hour  Intake 461.97 ml  Output 1350 ml  Net -888.03 ml   Filed Weights   11/03/17 0500  11/04/17 0500 11/05/17 0406  Weight: 96.4 kg (212 lb 8.4 oz) 91.6 kg (201 lb 15.1 oz) 89.7 kg (197 lb 12 oz)     Physical Examination:    General:  *Patient in no acute distress  Cardiovascular: S1-S2, regular clear to auscultation bilaterally  Respiratory: Clear to auscultation bilaterally  Abdomen: Soft, nontender, no organomegaly, PEG tube in place  Extremities: Trace edema in lower extremities  Neurologic: Alert oriented x3     Data Reviewed: I have personally reviewed following labs and imaging studies  CBG: Recent Labs  Lab 11/05/17 0332 11/05/17 0729 11/05/17 0832 11/05/17 1105 11/05/17 1532  GLUCAP 76 67* 114* 82 109*    CBC: Recent Labs  Lab 10/30/17 0906 10/31/17 0336 11/02/17 0301 11/03/17 0455 11/04/17 0342  WBC 19.3* 19.3* 12.5* 22.6* 15.2*  HGB 9.2* 8.8* 7.9* 9.4* 8.0*  HCT 32.4* 31.4* 27.8* 32.9* 27.9*  MCV 83.1 83.1 83.5 81.8 80.9  PLT 244 226 231 303 261    Basic Metabolic Panel: Recent Labs  Lab 10/30/17 0906 10/31/17 0336 11/02/17 0301 11/03/17 0455 11/04/17 0342  NA 137 137 139 142 141  K 3.6 4.0 4.0 3.8 3.6  CL 95* 95* 101 98 95*  CO2 32 32 32 38* 41*  GLUCOSE 175* 126* 81 88 84  BUN 18 16 12 9 9  CREATININE 0.44 0.37* 0.34* 0.40* 0.56  CALCIUM 8.3* 8.5* 8.3* 8.7* 8.6*  MG 1.7 1.8  --   --   --   PHOS  --  3.1  --   --   --     No results found for this or any previous visit (from the past 240 hour(s)).   Liver Function Tests: Recent Labs  Lab 11/02/17 0301  AST 30  ALT 34  ALKPHOS 86  BILITOT 0.7  PROT 5.0*  ALBUMIN 2.0*   No results for input(s): LIPASE, AMYLASE in the last 168 hours. No results for input(s): AMMONIA in the last 168 hours.  Cardiac Enzymes: No results for input(s): CKTOTAL, CKMB, CKMBINDEX, TROPONINI in the last 168 hours. BNP (last 3 results) Recent Labs    10/26/17 1056  BNP 231.1*    ProBNP (last 3 results) No results for input(s): PROBNP in the last 8760  hours.    Studies: Dg Chest Port 1 View  Result Date: 11/05/2017 CLINICAL DATA:  Respiratory failure EXAM: PORTABLE CHEST 1 VIEW COMPARISON:  11/03/2017, 10/26/2017 FINDINGS: Tracheostomy tube in satisfactory position. Small left pleural effusion. Mild left basilar atelectasis. Trace right pleural effusion. Mildly hyperinflated lungs likely reflecting underlying COPD. No pneumothorax. Stable cardiomediastinal silhouette. No aggressive osseous lesion. IMPRESSION: 1. Small left pleural effusion with left basilar airspace disease which may reflect atelectasis versus pneumonia unchanged from the prior exam. Electronically Signed   By: Hetal  Patel   On: 11/05/2017 15:33    Scheduled Meds: . albuterol  2.5 mg Nebulization Q6H  . apixaban  5 mg Per Tube BID  . budesonide    0.5 mg Nebulization BID  . chlorhexidine gluconate (MEDLINE KIT)  15 mL Mouth Rinse BID  . clonazePAM  1 mg Per Tube Q8H  . famotidine  20 mg Per Tube Q12H  . feeding supplement (PRO-STAT SUGAR FREE 64)  30 mL Per Tube BID  . furosemide  20 mg Intravenous Q12H  . insulin aspart  0-9 Units Subcutaneous Q4H  . mouth rinse  15 mL Mouth Rinse 10 times per day  . metoCLOPramide (REGLAN) injection  5 mg Intravenous Q6H  . midodrine  5 mg Per Tube TID WC  . pantoprazole sodium  40 mg Per Tube Daily  . PARoxetine  30 mg Per Tube Daily  . polyethylene glycol  17 g Per Tube Daily  . predniSONE  20 mg Per Tube Q breakfast  . QUEtiapine  100 mg Per Tube BID  . rOPINIRole  0.5 mg Per Tube QHS      Time spent: 25 min   S    [facility] Pager [phone]. If 7PM-7AM, please contact night-coverage at [email or web address], Office  [phone]  password [email or web address]  11/05/2017, 7:08 PM  LOS: 10 days             

## 2017-11-05 NOTE — Progress Notes (Signed)
PULMONARY / CRITICAL CARE MEDICINE   Name: Danielle Stevens MRN: [id number] DOB: August 17, 1970    ADMISSION DATE:  10/26/2017  REFERRING MD:  Dr. Lynelle Doctor, ER  CHIEF COMPLAINT:  Short of breath  HISTORY OF PRESENT ILLNESS:   47 yo female former smoker from [facility] with Lt sided chest pain, respiratory failure from spontaneous pneumothorax.  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  She was in hospital from 10/14/17 to 10/20/17 with Aspiration pneumonia with  Enterobacter bacteremia, Lt lung PE on eliquis.  SUBJECTIVE:  47 year old female he has been in [facility] since 10/26/2017, pneumothorax is resolved, chest tubes are out.  We will check chest x-ray on 11/05/2017 if okay no reason why she cannot return to [facility].  VITAL SIGNS: BP 98/68   Pulse (!) 101   Temp 98.6 F (37 C) (Oral)   Resp (!) 21   Ht 5\' 4"  (1.626 m)   Wt 197 lb 12 oz (89.7 kg)   SpO2 94%   BMI 33.94 kg/m   VENTILATOR SETTINGS: Vent Mode: PCV FiO2 (%):  [40 %] 40 % Set Rate:  [24 bmp] 24 bmp PEEP:  [5 cmH20] 5 cmH20 Plateau Pressure:  [20 cmH20-28 cmH20] 25 cmH20  INTAKE / OUTPUT: I/O last 3 completed shifts: In: 723 [I.V.:152.3; IV Piggyback:570.7] Out: 2550 [Urine:2550]  PHYSICAL EXAMINATION:  General: 47 year old female who is currently on full mechanical ventilatory support currently calm no acute distress HEENT: #6 tracheostomy in place connected to full mechanical ventilatory support pressure control 35 feet with tidal volumes approximately 300 to 500 cc Neuro:  CV: Heart sounds are regular regular rate and rhythm PULM: Coarse rhonchi bilaterally,.  He has intermittent air leak of approximately 50 cc during inspiration DU:KGUR, non-tender, bsx4 active PEG in place Extremities: warm/dry, 1+ edema  Skin: no rashes or lesions    LABS:  BMET Recent Labs  Lab 11/02/17 0301 11/03/17 0455 11/04/17 0342   NA 139 142 141  K 4.0 3.8 3.6  CL 101 98 95*  CO2 32 38* 41*  BUN 12 9 9   CREATININE 0.34* 0.40* 0.56  GLUCOSE 81 88 84   Electrolytes Recent Labs  Lab 10/30/17 0906 10/31/17 0336 11/02/17 0301 11/03/17 0455 11/04/17 0342  CALCIUM 8.3* 8.5* 8.3* 8.7* 8.6*  MG 1.7 1.8  --   --   --   PHOS  --  3.1  --   --   --    CBC Recent Labs  Lab 11/02/17 0301 11/03/17 0455 11/04/17 0342  WBC 12.5* 22.6* 15.2*  HGB 7.9* 9.4* 8.0*  HCT 27.8* 32.9* 27.9*  PLT 231 303 261   Coag's No results for input(s): APTT, INR in the last 168 hours.  Sepsis Markers No results for input(s): LATICACIDVEN, PROCALCITON, O2SATVEN in the last 168 hours. ABG Recent Labs  Lab 11/03/17 0536 11/03/17 0928  PHART 7.360 7.456*  PCO2ART 63.8* 51.3*  PO2ART 68.7* 82.0*   Liver Enzymes Recent Labs  Lab 11/02/17 0301  AST 30  ALT 34  ALKPHOS 86  BILITOT 0.7  ALBUMIN 2.0*   Cardiac Enzymes No results for input(s): TROPONINI, PROBNP in the last 168 hours. Glucose Recent Labs  Lab 11/04/17 1923 11/04/17 2319 11/05/17 0306 11/05/17 0332 11/05/17 0729 11/05/17 0832  GLUCAP 91 78 62* 76 67* 114*   Imaging No results found. STUDIES:   CULTURES: Blood 6/30 >> negative Sputum 6/30 >> Pseudomonas aeruginosa  ANTIBIOTICS: Vancomycin 6/30 >> 6/30  Cefepime 6/30 >> 6/30 Fortaz 7/01 >> 7/02 Meropenem 7/02 >>   SIGNIFICANT EVENTS: 6/30 Admit, TCTS consulted, fever 101.3 7/01 TCTS s/o  LINES/TUBES: Trach >>  PEG >> Lt inferior chest tube 6/30 >> 7/03 Lt anterior chest tube 6/30 >> 7/05  DISCUSSION: 47 yo female with spontaneous Lt PTX with acute on chronic hypoxic/hypercapnic respiratory failure in setting of A1AT with severe COPD/emphysema complicated by HCAP.  No issues after d/c'ing chest tubes.  Main concern now is Pseudomonal pneumonia.  ASSESSMENT / PLAN:  Acute on chronic hypoxic/hypercapnic respiratory failure. A1AT with severe COPD/emphysema. -Continue full vent  support currently on pressure control 30 with 5 of PEEP generating tidal volume anywhere from 300 to 500 cc -She is not weanable due to her history of alpha-1 antitrypsin and severe COPD and emphysema- -she is probably reached maximal hospital benefit at [facility] and can transfer back to [facility] -Questionable aspiration of tube feedings although nutrition does not feel this is appropriate color.  Lt pneumothorax. -Follow-up chest x-ray for completeness -Left chest tubes have been removed  Hx of PE. -Currently on Eliquis  Hypokalemia. Recent Labs  Lab 11/02/17 0301 11/03/17 0455 11/04/17 0342  K 4.0 3.8 3.6    -Follow electrolytes replete as needed  Edema. -PRN Lasix per [facility]  Chronic hypotension. -Midodrine  HCAP with Pseudomonas in sputum. -Currently on meropenem  Severe anxiety, RLS. - Scheduled klonopin, paxil, seroquel, requip  DM type II with steroid induced hyperglycemia. - SSI  Anemia of critical illness, and chronic disease. Recent Labs    11/03/17 0455 11/04/17 0342  HGB 9.4* 8.0*    -Follow CBC transfuse per protocol  DVT prophylaxis - eliquis SUP - protonix Nutrition - tube feeds, ice chips Goals of care - full code    Brett Canales Minor ACNP [facility] PCCM Pager [phone] till 1 pm If no answer page [phone] 11/05/2017, 10:16 AM

## 2017-11-05 NOTE — Progress Notes (Signed)
Hypoglycemic Event  CBG: 62  Treatment: D50 IV 25 mL  Symptoms: None  Follow-up CBG: Time:0332 CBG Result:76  Possible Reasons for Event: Inadequate meal intake  Comments/MD notified:    Della Goo

## 2017-11-05 NOTE — Progress Notes (Signed)
Nutrition Follow-up  DOCUMENTATION CODES:   Obesity unspecified  INTERVENTION:   Vital High Protein @ 40 ml/hr via PEG 30 ml Prostat BID  Provides: 1160 kcals, 114 grams protein, 803 ml free water.   NUTRITION DIAGNOSIS:   Inadequate oral intake related to inability to eat as evidenced by NPO status.  Ongoing  GOAL:   Provide needs based on ASPEN/SCCM guidelines  TF to be re-started  MONITOR:   Vent status, TF tolerance, Labs, I & O's  REASON FOR ASSESSMENT:   Ventilator, Consult Enteral/tube feeding initiation and management  ASSESSMENT:   47 yo female with PMH of COPD, trach, PEG, alpha-1-antitrypsin deficiency, HTN, DM who was admitted on 6/30 with a spontaneous PTX.   7/6- TF held due to suspected aspiration  7/8- Reglan started QID  Pt eating ice chips upon visit. Per RN secretions continue despite TF being held. Color still looks to be yellowish/[place] which is not consistent with TF coloring. Spoke with MD regarding nutrition status, okay to re-start feedings. Will monitor for symptoms.  Patient remains on ventilator support MV: 9.3 L/min Temp (24hrs), Avg:98.7 F (37.1 C), Min:97.6 F (36.4 C), Max:99.2 F (37.3 C)  I/O: +5976 ml since admit UOP: 1800 ml x 24 hrs  Left chest tube has been removed. Weight down to 197 lb from 212 lb. Will continue to monitor trends.   Medications reviewed and include: 20 mg lasix BID, 5 mg Reglan QID, prednisone Labs reviewed.   Diet Order:   Diet Order           Diet NPO time specified Except for: Ice Chips  Diet effective now          EDUCATION NEEDS:   No education needs have been identified at this time  Skin:  Skin Assessment: Skin Integrity Issues: Skin Integrity Issues:: Other (Comment) Other: MASD to groin, labia, & perineum  Last BM:  11/05/17  Height:   Ht Readings from Last 1 Encounters:  10/28/17 5\' 4"  (1.626 m)    Weight:   Wt Readings from Last 1 Encounters:  11/05/17 197 lb 12 oz  (89.7 kg)    Ideal Body Weight:  54.5 kg  BMI:  Body mass index is 33.94 kg/m.  Estimated Nutritional Needs:   Kcal:  1000-1255  Protein:  >/= 109 grams  Fluid:  >1.5 L/day    Vanessa Kick RD, LDN Clinical Nutrition Pager # - [phone]

## 2017-11-06 DIAGNOSIS — R0902 Hypoxemia: Secondary | ICD-10-CM | POA: Diagnosis not present

## 2017-11-06 DIAGNOSIS — J9311 Primary spontaneous pneumothorax: Secondary | ICD-10-CM | POA: Diagnosis not present

## 2017-11-06 DIAGNOSIS — Z9289 Personal history of other medical treatment: Secondary | ICD-10-CM

## 2017-11-06 DIAGNOSIS — J449 Chronic obstructive pulmonary disease, unspecified: Secondary | ICD-10-CM | POA: Diagnosis not present

## 2017-11-06 LAB — GLUCOSE, CAPILLARY
Glucose-Capillary: 91 mg/dL (ref 70–99)
Glucose-Capillary: 120 mg/dL — ABNORMAL HIGH (ref 70–99)
Glucose-Capillary: 161 mg/dL — ABNORMAL HIGH (ref 70–99)

## 2017-11-06 MED ORDER — PRO-STAT SUGAR FREE PO LIQD: 30.0000 mL | Freq: Two times a day (BID) | ORAL | 0 refills | Status: AC

## 2017-11-06 MED ORDER — ORAL CARE MOUTH RINSE: 15.0000 mL | Freq: Two times a day (BID) | OROMUCOSAL | 0 refills | Status: AC

## 2017-11-06 MED ORDER — PANTOPRAZOLE SODIUM 40 MG PO PACK: 40.0000 mg | PACK | Freq: Every day | ORAL | Status: AC

## 2017-11-06 MED ORDER — APIXABAN 5 MG PO TABS: 5.0000 mg | ORAL_TABLET | Freq: Two times a day (BID) | ORAL | Status: AC

## 2017-11-06 MED ORDER — FUROSEMIDE 10 MG/ML IJ SOLN: 20.0000 mg | Freq: Two times a day (BID) | INTRAMUSCULAR | 0 refills | Status: AC

## 2017-11-06 MED ORDER — POLYETHYLENE GLYCOL 3350 17 G PO PACK: 17.0000 g | PACK | Freq: Every day | ORAL | 0 refills | Status: AC

## 2017-11-06 MED ORDER — PREDNISONE 20 MG PO TABS: 20.0000 mg | ORAL_TABLET | Freq: Every day | ORAL | Status: AC

## 2017-11-06 MED ORDER — ALBUTEROL SULFATE (2.5 MG/3ML) 0.083% IN NEBU: 2.5000 mg | INHALATION_SOLUTION | Freq: Four times a day (QID) | RESPIRATORY_TRACT | 12 refills | Status: AC

## 2017-11-06 MED ORDER — VITAL HIGH PROTEIN PO LIQD: 1000.0000 mL | ORAL | Status: AC

## 2017-11-06 MED ORDER — FAMOTIDINE 40 MG/5ML PO SUSR: 20.0000 mg | Freq: Two times a day (BID) | ORAL | 0 refills | Status: AC

## 2017-11-06 MED ORDER — ALBUTEROL SULFATE (2.5 MG/3ML) 0.083% IN NEBU: 2.5000 mg | INHALATION_SOLUTION | RESPIRATORY_TRACT | 12 refills | Status: AC | PRN

## 2017-11-06 MED ORDER — METOCLOPRAMIDE HCL 5 MG/ML IJ SOLN: 5.0000 mg | Freq: Four times a day (QID) | INTRAMUSCULAR | 0 refills | Status: AC

## 2017-11-06 MED ORDER — ONDANSETRON HCL 4 MG/2ML IJ SOLN: 4.0000 mg | Freq: Four times a day (QID) | INTRAMUSCULAR | 0 refills | Status: AC | PRN

## 2017-11-06 NOTE — Progress Notes (Signed)
Patient will discharge to [facility] Anticipated discharge date: 7/11 Family notified: Amber pt dtr  Transportation by [employer]- called at 1pm  CSW signing off.  Burna Sis, LCSW Clinical Social Worker [phone]

## 2017-11-06 NOTE — Care Management Note (Signed)
Case Management Note  Patient Details  Name: Danielle Stevens MRN: [id number] Date of Birth: March 11, 1971  Subjective/Objective:   Patient to dc back to [facility], CSW aware.                 Action/Plan: DC to [facility]  Expected Discharge Date:  11/06/17               Expected Discharge Plan:  Skilled Nursing Facility  In-House Referral:  Clinical Social Work  Discharge planning Services  CM Consult  Post Acute Care Choice:    Choice offered to:     DME Arranged:    DME Agency:     HH Arranged:    HH Agency:     Status of Service:  Completed, signed off  If discussed at [employer], dates discussed:    Additional Comments:  Leone Haven, RN 11/06/2017, 10:13 AM

## 2017-11-06 NOTE — Progress Notes (Signed)
[facility] is at the bedside to transport the patient to [facility].

## 2017-11-06 NOTE — Discharge Summary (Signed)
Physician Discharge Summary  Danielle Stevens GOT:[id number] DOB: 01-21-1971 DOA: 10/26/2017  PCP: Patient, No Pcp Per  Admit date: 10/26/2017 Discharge date: 11/06/2017  Time spent: 25 minutes  Recommendations for Outpatient Follow-up:  1. Patient to be discharged to [facility] hospital   Discharge Diagnoses:  Active Problems:   Pneumothorax   Discharge Condition: Stable  Diet recommendation: Tube feeds  Filed Weights   11/04/17 0500 11/05/17 0406 11/06/17 0500  Weight: 91.6 kg (201 lb 15.1 oz) 89.7 kg (197 lb 12 oz) 88.4 kg (194 lb 14.2 oz)    History of present illness:  Brief HPI:   47yo F former smoker from [place] with L sided chest pain and acute respiratory failure who was found to have a spontaneous PTX. 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 She was in hospital from 10/14/17 to 10/20/17 with aspiration pneumonia with Enterobacter bacteremia, Lt lung PE on eliquis.   Hospital Course:   1. Acute on chronic hypoxic/hypercapnic respiratory failure-due to severe COPD, alpha 1 antitrypsin deficiency, chronic tracheostomy status, chronic vent dependent not a transplant candidate per [facility] and [facility].  Only weekly Prolastin as patient is not willing to consider stopping it, full vent support  2. Left spontaneous pneumothorax -anterior chest tube placed by TC TS and inferior pigtail placed by EDP all chest tubes now out per PCCM and stable. 3. Suspected reflux with silent aspiration of tube feeds-questionable aspiration of tube feeds when tubing was stained with tube feeds, KUB on 7 7 without evidence of ileus or gastric outlet obstruction.  Patient empirically on Reglan.  Tube feeds have been on hold and she continues to have the secretions.  PCCM does not feel that patient is having aspiration from the tube feeds.  Tube feeds have been restarted 4. History of pulmonary embolism-continue  Eliquis for 6 months through November than lifelong pharmacologic prophylaxis thereafter. 5. Pseudomonas healthcare associated pneumonia-continue meropenem to completed 10-day course today 6. Severe anxiety, RLS-continue scheduled Klonopin, Paxil, Seroquel, Requip.  Anxiety is well controlled at this time. 7. Chronic multifactorial normocytic anemia-hemoglobin stable no evidence of bleeding on anticoagulation.  8. Diabetes mellitus without complication-CBG well controlled, hemoglobin A1c as of 09/12/2017 was 5.1. Lantus has been discontinued, continue sliding scale insulin. 9. Diarrhea-C. difficile negative during hospital stay late June    Procedures:  None   Consultations:  PCCM  Discharge Exam: Vitals:   11/06/17 0800 11/06/17 0900  BP: 92/66 (!) 95/57  Pulse: (!) 107 (!) 114  Resp: (!) 22 (!) 25  Temp:    SpO2: 96% 91%    General: Appears in no acute distress Cardiovascular: S1S2 RRR Respiratory: Clear bilaterally  Discharge Instructions    Allergies as of 11/06/2017      Reactions   Wasp Venom Protein Shortness Of Breath   Adhesive [tape] Other (See Comments)   Bruises    Coconut Oil Hives   Latex Other (See Comments)   Bruises   Robitussin Severe Multi-symp [phenylephrine-[id number]] Diarrhea, Other (See Comments)   Allergic, per verbal MAR   Shellfish-derived Products Hives, Other (See Comments)   Allergic, per verbal MAR      Medication List    STOP taking these medications   free water Soln   insulin glargine 100 UNIT/ML injection Commonly known as:  LANTUS   levalbuterol 0.63 MG/3ML nebulizer solution Commonly known as:  XOPENEX Replaced by:  albuterol (2.5 MG/3ML) 0.083% nebulizer solution   levalbuterol 1.25 MG/0.5ML  nebulizer solution Commonly known as:  XOPENEX     TAKE these medications   acetaminophen 325 MG tablet Commonly known as:  TYLENOL Place 650 mg into feeding tube every 6 (six) hours.   ACIDOPHILUS PO Place 1 capsule into  feeding tube daily.   albuterol (2.5 MG/3ML) 0.083% nebulizer solution Commonly known as:  PROVENTIL Take 3 mLs (2.5 mg total) by nebulization every 2 (two) hours as needed for wheezing or shortness of breath. What changed:    when to take this  reasons to take this   albuterol (2.5 MG/3ML) 0.083% nebulizer solution Commonly known as:  PROVENTIL Take 3 mLs (2.5 mg total) by nebulization every 6 (six) hours. What changed:  You were already taking a medication with the same name, and this prescription was added. Make sure you understand how and when to take each. Replaces:  levalbuterol 0.63 MG/3ML nebulizer solution   alpha-1-proteinase inhibitor (human) 1000 MG/20ML Soln Commonly known as:  PROLASTIN Inject 4,964 mg into the vein once a week.   apixaban 5 MG Tabs tablet Commonly known as:  ELIQUIS Place 1 tablet (5 mg total) into feeding tube 2 (two) times daily. What changed:  Another medication with the same name was removed. Continue taking this medication, and follow the directions you see here.   budesonide 0.5 MG/2ML nebulizer solution Commonly known as:  PULMICORT Take 2 mLs (0.5 mg total) by nebulization 2 (two) times daily.   chlorhexidine gluconate (MEDLINE KIT) 0.12 % solution Commonly known as:  PERIDEX 15 mLs by Mouth Rinse route 2 (two) times daily.   clonazePAM 1 MG tablet Commonly known as:  KLONOPIN Place 1 tablet (1 mg total) into feeding tube every 8 (eight) hours.   famotidine 40 MG/5ML suspension Commonly known as:  PEPCID Place 2.5 mLs (20 mg total) into feeding tube every 12 (twelve) hours.   feeding supplement (PRO-STAT SUGAR FREE 64) Liqd Place 30 mLs into feeding tube 2 (two) times daily.   feeding supplement (VITAL HIGH PROTEIN) Liqd liquid Place 1,000 mLs into feeding tube continuous.   furosemide 10 MG/ML injection Commonly known as:  LASIX Inject 2 mLs (20 mg total) into the vein every 12 (twelve) hours.   insulin aspart 100 UNIT/ML  injection Commonly known as:  novoLOG Inject 0-15 Units into the skin every 4 (four) hours. What changed:    how much to take  when to take this  additional instructions   LORazepam 1 MG tablet Commonly known as:  ATIVAN Place 1 tablet (1 mg total) into feeding tube every 2 (two) hours as needed for anxiety.   metoCLOPramide 5 MG/ML injection Commonly known as:  REGLAN Inject 1 mL (5 mg total) into the vein every 6 (six) hours.   midodrine 5 MG tablet Commonly known as:  PROAMATINE Place 1 tablet (5 mg total) into feeding tube 3 (three) times daily with meals.   mouth rinse Liqd solution 15 mLs by Mouth Rinse route 2 times daily at 12 noon and 4 pm.   multivitamin with minerals Tabs tablet Place 1 tablet into feeding tube daily.   ondansetron 4 MG/2ML Soln injection Commonly known as:  ZOFRAN Inject 2 mLs (4 mg total) into the vein every 6 (six) hours as needed for nausea or vomiting.   oxyCODONE 5 MG/5ML solution Commonly known as:  ROXICODONE Place 5 mg into feeding tube every 3 (three) hours.   pantoprazole sodium 40 mg/20 mL Pack Commonly known as:  PROTONIX Place 20 mLs (40  mg total) into feeding tube daily. Start taking on:  11/07/2017   PARoxetine 30 MG tablet Commonly known as:  PAXIL Place 1 tablet (30 mg total) into feeding tube daily.   polyethylene glycol packet Commonly known as:  MIRALAX / GLYCOLAX Place 17 g into feeding tube daily. Start taking on:  11/07/2017   predniSONE 20 MG tablet Commonly known as:  DELTASONE Place 1 tablet (20 mg total) into feeding tube daily with breakfast. Start taking on:  11/07/2017 What changed:  how much to take   promethazine 25 MG/ML injection Commonly known as:  PHENERGAN Inject 0.5 mLs (12.5 mg total) into the vein every 6 (six) hours as needed for nausea or vomiting.   QUEtiapine 100 MG tablet Commonly known as:  SEROQUEL Place 1 tablet (100 mg total) into feeding tube 2 (two) times daily. What changed:   when to take this   rOPINIRole 0.5 MG tablet Commonly known as:  REQUIP Place 1 tablet (0.5 mg total) into feeding tube at bedtime.      Allergies  Allergen Reactions  . Wasp Venom Protein Shortness Of Breath  . Adhesive [Tape] Other (See Comments)    Bruises   . Coconut Oil Hives  . Latex Other (See Comments)    Bruises   . Robitussin Severe Multi-Symp [Phenylephrine-Dm-Gg-Apap] Diarrhea and Other (See Comments)    Allergic, per verbal MAR  . Shellfish-Derived Products Hives and Other (See Comments)    Allergic, per verbal MAR      The results of significant diagnostics from this hospitalization (including imaging, microbiology, ancillary and laboratory) are listed below for reference.    Significant Diagnostic Studies: Ct Angio Chest Pe W And/or Wo Contrast  Result Date: 10/14/2017 CLINICAL DATA:  Shortness of breath and tachycardia EXAM: CT ANGIOGRAPHY CHEST WITH CONTRAST TECHNIQUE: Multidetector CT imaging of the chest was performed using the standard protocol during bolus administration of intravenous contrast. Multiplanar CT image reconstructions and MIPs were obtained to evaluate the vascular anatomy. CONTRAST:  149m ISOVUE-370 IOPAMIDOL (ISOVUE-370) INJECTION 76% COMPARISON:  Chest CT angiogram Sep 16, 2017 and chest radiograph October 14, 2017 FINDINGS: Cardiovascular: There is a small incompletely obstructing pulmonary embolus in the proximal left lower lobe pulmonary artery. No completely obstructing pulmonary embolus seen on this study. The right ventricle to left ventricle diameter ratio is less than 0.9, not consistent with right heart strain. There is no thoracic aortic aneurysm or dissection. The visualized great vessels appear unremarkable except for calcification in the proximal left subclavian artery. There are foci of calcification in the aorta. No pericardial effusion or pericardial thickening evident. Central catheter tip is in the superior vena cava.  Mediastinum/Nodes: Thyroid appears unremarkable. There are subcentimeter mediastinal lymph nodes. There is no adenopathy by size criteria. No esophageal lesions are evident. Lungs/Pleura: There is extensive centrilobular and paraseptal emphysematous change. There is a large area of bullous disease throughout much of the left upper lobe and lingula. There is scarring in the right base. There is bilateral lower lobe as well as right middle lobe bronchiectatic change. No evident edema or consolidation. No pleural effusion or pleural thickening evident. Endotracheal tube tip is in the mid trachea.  No pneumothorax. Upper Abdomen: There is a small myelolipoma in the left adrenal measuring just over 1 x 1 cm. Visualized upper abdominal structures otherwise appear unremarkable. Musculoskeletal: No blastic or lytic bone lesions. No chest wall lesions are evident. Review of the MIP images confirms the above findings. IMPRESSION: 1. Small incompletely obstructing  proximal left lower lobe pulmonary embolus. No right heart strain. No other pulmonary emboli evident. Note that the current incompletely obstructing pulmonary embolus on the left is best seen on axial slices 89 through 94 series 5, coronal slices 97 and 98 series 8, and sagittal slices 737 through 106 series 9. 2. No thoracic aortic aneurysm or dissection. There is aortic and great vessel atherosclerosis. 3. Extensive underlying emphysematous change with areas of scarring and bronchiectasis, stable. No frank edema or consolidation evident. 4.  No appreciable adenopathy. 5.  Small benign left adrenal myelolipoma. Aortic Atherosclerosis (ICD10-I70.0) and Emphysema (ICD10-J43.9). Critical Value/emergent results were called by telephone at the time of interpretation on 10/14/2017 at 12:20 pm to [facility], who verbally acknowledged these results. Electronically Signed   By: Lowella Grip III M.D.   On: 10/14/2017 12:22   Dg Abdomen Peg Tube Location  Result  Date: 10/14/2017 CLINICAL DATA:  Peg placement EXAM: ABDOMEN - 1 VIEW COMPARISON:  None. FINDINGS: Gastrostomy tube was injected with 30 mL Isovue-300. Contrast in the gastric fundus. No extravasation. Normal bowel gas pattern. IMPRESSION: Gastrostomy tube in the body the stomach. Electronically Signed   By: Franchot Gallo M.D.   On: 10/14/2017 15:34   Dg Chest Port 1 View  Result Date: 11/05/2017 CLINICAL DATA:  Respiratory failure EXAM: PORTABLE CHEST 1 VIEW COMPARISON:  11/03/2017, 10/26/2017 FINDINGS: Tracheostomy tube in satisfactory position. Small left pleural effusion. Mild left basilar atelectasis. Trace right pleural effusion. Mildly hyperinflated lungs likely reflecting underlying COPD. No pneumothorax. Stable cardiomediastinal silhouette. No aggressive osseous lesion. IMPRESSION: 1. Small left pleural effusion with left basilar airspace disease which may reflect atelectasis versus pneumonia unchanged from the prior exam. Electronically Signed   By: Kathreen Devoid   On: 11/05/2017 15:33   Dg Chest Port 1 View  Result Date: 11/03/2017 CLINICAL DATA:  Hypoxia, shortness of breath. EXAM: PORTABLE CHEST 1 VIEW COMPARISON:  Most recent radiograph yesterday. Most recent CT 10/14/2017 FINDINGS: Tracheostomy tube at the thoracic inlet. Unchanged heart size and mediastinal contours. Diffuse left lung opacity and pleural effusion, unchanged from radiographs yesterday. Again seen underlying emphysema and hyperinflation. No pneumothorax. Slight atelectasis at the right lung base. IMPRESSION: 1. Unchanged consolidation throughout the left lung with left pleural effusion. 2. Emphysema.  Minimal right lung base atelectasis. Electronically Signed   By: Jeb Levering M.D.   On: 11/03/2017 06:20   Dg Chest Port 1 View  Result Date: 11/02/2017 CLINICAL DATA:  Aspiration EXAM: PORTABLE CHEST 1 VIEW COMPARISON:  November 01, 2017 FINDINGS: Tracheostomy catheter tip is 7.9 cm above the carina. No pneumothorax. There is  widespread consolidation throughout much of the left lung with left pleural effusion. Right lung is clear. Heart size is normal. Pulmonary vascularity is within normal limits. There is aortic atherosclerosis. No evident adenopathy. No bone lesions. IMPRESSION: No appreciable change in tracheostomy position.  No pneumothorax. Widespread consolidation on the left with left pleural effusion. Appearance consistent with pneumonia or aspiration. Both entities may be present concurrently. Right lung is clear.  Stable cardiac silhouette. There is aortic atherosclerosis. Aortic Atherosclerosis (ICD10-I70.0). Electronically Signed   By: Lowella Grip III M.D.   On: 11/02/2017 11:40   Dg Chest Port 1 View  Result Date: 11/01/2017 CLINICAL DATA:  Respiratory failure EXAM: PORTABLE CHEST 1 VIEW COMPARISON:  October 31, 2017 FINDINGS: Tracheostomy catheter tip is 8.7 cm above the carina. No pneumothorax. There is a left pleural effusion with consolidation in the left base. There is underlying  hyperexpansion of the lungs. There is bullous disease in the upper lobes as well as to a lesser degree in the right base. Heart size is normal. Pulmonary vascularity reflects underlying emphysematous change with decreased vascularity in the upper lobes. No adenopathy evident. No evident bone lesions. There is aortic atherosclerosis. IMPRESSION: Tracheostomy as described. No pneumothorax. Underlying emphysematous change. Persistent left pleural effusion with consolidation left base region, consistent with pneumonia. Stable cardiac silhouette. There is aortic atherosclerosis. Appearance overall essentially stable from 1 day prior. Aortic Atherosclerosis (ICD10-I70.0) and Emphysema (ICD10-J43.9). Electronically Signed   By: Lowella Grip III M.D.   On: 11/01/2017 07:13   Dg Chest Port 1 View  Result Date: 10/31/2017 CLINICAL DATA:  Left chest tube removal.  Left effusion. EXAM: PORTABLE CHEST 1 VIEW 2:54 p.m. COMPARISON:  10/31/2017  at 4:55 a.m. and 10/30/2017, 10/29/2017 and 10/28/2017 FINDINGS: Left-sided chest tube has been removed. No residual pneumothorax. Tracheostomy tube unchanged with the tip at the thoracic inlet. Persistent left pleural effusion. Severe emphysematous changes are again noted bilaterally. Heart size is normal. Pulmonary vascularity is within normal limits. IMPRESSION: 1. No pneumothorax after left chest tube removal. 2. Persistent  moderate left pleural effusion. Electronically Signed   By: Lorriane Shire M.D.   On: 10/31/2017 15:09   Dg Chest Port 1 View  Result Date: 10/31/2017 CLINICAL DATA:  Intubation. EXAM: PORTABLE CHEST 1 VIEW COMPARISON:  10/30/2017.  10/29/2017. FINDINGS: Tracheostomy tube noted in stable position. Left chest tube in stable position. No pneumothorax identified. Heart size normal. Left base atelectasis/infiltrate unchanged. Small left pleural effusion. IMPRESSION: 1. Tracheostomy tube and left chest tube stable position. No pneumothorax. 2. Left base atelectasis/infiltrate unchanged. Small left pleural effusion. Electronically Signed   By: Marcello Moores  Register   On: 10/31/2017 06:13   Dg Chest Port 1 View  Result Date: 10/30/2017 CLINICAL DATA:  Followup pneumothorax EXAM: PORTABLE CHEST 1 VIEW COMPARISON:  10/29/2017 FINDINGS: Tracheostomy well positioned. Left chest tube remains in place. No visible pleural air on this exam. Persistent atelectasis of the left lower lobe. Right chest is clear. IMPRESSION: No pneumothorax visible today. Persistent atelectasis of the left lower lobe. Electronically Signed   By: Nelson Chimes M.D.   On: 10/30/2017 06:59   Dg Chest Port 1 View  Result Date: 10/29/2017 CLINICAL DATA:  Chest tube removal. EXAM: PORTABLE CHEST 1 VIEW COMPARISON:  Radiograph October 29, 2017. FINDINGS: Stable cardiomediastinal silhouette. Tracheostomy tube is unchanged in position. Right lung is clear. Pigtail drainage catheter seen in left lower lobe has been removed. Large bore  left-sided chest tube remains with tip in left lung apex. Minimal left apical pneumothorax is noted. Stable left basilar opacity is noted concerning for atelectasis or infiltrate with associated pleural effusion. Bony thorax is unremarkable. IMPRESSION: Pigtail drainage catheter noted in left lower lobe on prior exam has been removed. Stable position of left apical chest tube is noted. Minimal left apical pneumothorax is noted. Electronically Signed   By: Marijo Conception, M.D.   On: 10/29/2017 10:14   Dg Chest Port 1 View  Result Date: 10/29/2017 CLINICAL DATA:  Pneumothorax EXAM: PORTABLE CHEST 1 VIEW COMPARISON:  Yesterday FINDINGS: Two chest tubes on the left. Haziness of the left chest of favoring atelectasis and pleural fluid. No visible pneumothorax. Hyperinflated, lucent right lung. Tracheostomy tube in place. Normal heart size IMPRESSION: Stable tube positioning with no visible pneumothorax. Hazy opacity on the left that is likely atelectasis and pleural fluid. Electronically Signed   By: Angelica Chessman  Watts M.D.   On: 10/29/2017 07:22   Dg Chest Port 1 View  Result Date: 10/28/2017 CLINICAL DATA:  Follow-up pneumothorax EXAM: PORTABLE CHEST 1 VIEW COMPARISON:  10/26/2017 FINDINGS: Tracheostomy in 2 left chest tubes remain in place. Small amount of pleural air at the apex seen yesterday is no longer visible. Persistent volume loss/infiltrate in the left lower lobe. Less subcutaneous emphysema. Right chest remains clear. IMPRESSION: No visible pleural air today. Persistent volume loss/infiltrate in the left lower lobe. Less subcutaneous emphysema. Electronically Signed   By: Nelson Chimes M.D.   On: 10/28/2017 06:57   Dg Chest Portable 1 View  Result Date: 10/26/2017 CLINICAL DATA:  47 y/o  F; left chest tube placement. EXAM: PORTABLE CHEST 1 VIEW COMPARISON:  10/26/2017 chest radiograph. FINDINGS: Stable normal cardiac silhouette. Aortic atherosclerosis with calcification. Tracheostomy tube is stable.  Two left-sided chest tubes. Mild subcutaneous emphysema in left chest wall. Reinflation of left lung, no residual pneumothorax identified. Small left effusion. Opacity at the left lung base, probably re-expansion edema. No acute osseous abnormality is evident. IMPRESSION: 1. Interval placement of left superior chest tube. No residual pneumothorax identified. Mild left chest wall emphysema. 2. Left basilar opacity, probably re-expansion edema. Small left effusion. Electronically Signed   By: Kristine Garbe M.D.   On: 10/26/2017 13:25   Dg Chest Portable 1 View  Result Date: 10/26/2017 CLINICAL DATA:  Chest tube placement. EXAM: PORTABLE CHEST 1 VIEW COMPARISON:  October 26, 2017 FINDINGS: A chest tube has been placed, terminating over the left lower hemithorax. The large left pneumothorax remains, unchanged with collapse of much of the upper lobe. No mediastinal or cardiac shift noted. Opacity in left base persists. The ETT is in good position. No pneumothorax on the right. Visualized right lung is unremarkable. IMPRESSION: 1. A left chest tube has been placed, terminating over the lower left hemithorax. There is a persistent large pneumothorax, not significantly changed. No cardiac or mediastinal shift to suggest a tension pneumothorax at this time. The clinical team is aware of the patient's pneumothorax as it was called after the original chest x-ray. Electronically Signed   By: Dorise Bullion III M.D   On: 10/26/2017 11:01   Dg Chest Portable 1 View  Result Date: 10/26/2017 CLINICAL DATA:  Patient with respiratory distress EXAM: PORTABLE CHEST 1 VIEW COMPARISON:  Chest radiograph 10/16/2017 FINDINGS: ET tube terminates in the mid trachea. Monitoring leads overlie the patient. Large left pneumothorax. Lungs are hyperinflated. Thoracic spine degenerative changes. IMPRESSION: Large left pneumothorax. Critical Value/emergent results were called by telephone at the time of interpretation on 10/26/2017  at 9:46 am to Dr. Dorie Rank , who verbally acknowledged these results. Electronically Signed   By: Lovey Newcomer M.D.   On: 10/26/2017 09:53   Dg Chest Portable 1 View  Result Date: 10/16/2017 CLINICAL DATA:  Respiratory failure EXAM: PORTABLE CHEST 1 VIEW COMPARISON:  10/15/2017 FINDINGS: Tracheostomy tube and left upper extremity PICC are stable. Lungs remain hyperaerated with scarring and emphysema. No pneumothorax. No pleural effusion. IMPRESSION: No active cardiopulmonary disease.  Chronic changes. Electronically Signed   By: Marybelle Killings M.D.   On: 10/16/2017 08:14   Dg Chest Port 1 View  Result Date: 10/15/2017 CLINICAL DATA:  Respiratory failure, shortness of breath. EXAM: PORTABLE CHEST 1 VIEW COMPARISON:  CT scan chest of October 14, 2017 and portable chest x-ray of the same day. FINDINGS: The lungs remain hyperinflated and hyperlucent. Minimal blunting of the lateral costophrenic angles is present and  stable. There are coarse lung markings in the right perihilar and infrahilar regions which are stable. The heart and pulmonary vascularity are normal. There is calcification in the wall of the aortic arch. The endotracheal tube tip projects approximately 9 cm above the carina and is located at the level of the clavicular heads. The left-sided PICC line tip projects over the midportion of the SVC. IMPRESSION: COPD. Chronic interstitial changes in the right perihilar region. No acute pneumonia nor pulmonary edema. Thoracic aortic atherosclerosis. The support tubes are in reasonable position. Electronically Signed   By: David  Martinique M.D.   On: 10/15/2017 07:47   Dg Chest Portable 1 View  Result Date: 10/14/2017 CLINICAL DATA:  Shortness of breath, ventilator dependent with tracheostomy, hypertension, diabetes mellitus EXAM: PORTABLE CHEST 1 VIEW COMPARISON:  Portable exam 1038 hours compared to 09/25/2017 FINDINGS: Tracheostomy tube projects over tracheal air column. LEFT arm PICC line tip projects over  SVC. Normal heart size, mediastinal contours, and pulmonary vascularity. Emphysematous and bronchitic changes consistent with COPD. Persistent interstitial prominence in the mid to lower RIGHT lung concerning for persistent infiltrate. Chronic blunting of the LEFT lateral costophrenic angle. No definite pleural effusion or pneumothorax. Bones demineralized. Atherosclerotic calcification noted at aortic arch. IMPRESSION: COPD changes with chronic blunting of LEFT costophrenic angle and suspected persistent mild interstitial infiltrate in the mid to lower RIGHT lung. When compared to the previous exam no significant change. Electronically Signed   By: Lavonia Dana M.D.   On: 10/14/2017 10:57   Dg Abd Portable 1v  Result Date: 11/03/2017 CLINICAL DATA:  Vomiting tube feeds.  Abdominal distension. EXAM: PORTABLE ABDOMEN - 1 VIEW COMPARISON:  One view abdomen 10/14/2017. Chest radiographs 11/03/2017. FINDINGS: 1012 hours. Percutaneous gastrostomy noted in the left upper quadrant. The visualized bowel gas pattern is normal. There is no supine evidence of free intraperitoneal air. Left pleural effusion and left basilar airspace opacity are similar to recent chest radiographs. IMPRESSION: No evidence of bowel obstruction or acute abdominal process. Persistent left pleural effusion and left basilar airspace disease, similar to recent chest radiographs. Electronically Signed   By: Richardean Sale M.D.   On: 11/03/2017 10:36    Microbiology: Recent Results (from the past 240 hour(s))  Culture, respiratory (NON-Expectorated)     Status: None (Preliminary result)   Collection Time: 11/05/17  6:17 PM  Result Value Ref Range Status   Specimen Description TRACHEAL ASPIRATE  Final   Special Requests NONE  Final   Gram Stain   Final    ABUNDANT WBC PRESENT, PREDOMINANTLY PMN RARE GRAM POSITIVE COCCI RARE GRAM VARIABLE ROD Performed at [facility], [address] [place], [place] [address]    Culture PENDING   Incomplete   Report Status PENDING  Incomplete     Labs: Basic Metabolic Panel: Recent Labs  Lab 10/31/17 0336 11/02/17 0301 11/03/17 0455 11/04/17 0342  NA 137 139 142 141  K 4.0 4.0 3.8 3.6  CL 95* 101 98 95*  CO2 32 32 38* 41*  GLUCOSE 126* 81 88 84  BUN '16 12 9 9  '$ CREATININE 0.37* 0.34* 0.40* 0.56  CALCIUM 8.5* 8.3* 8.7* 8.6*  MG 1.8  --   --   --   PHOS 3.1  --   --   --    Liver Function Tests: Recent Labs  Lab 11/02/17 0301  AST 30  ALT 34  ALKPHOS 86  BILITOT 0.7  PROT 5.0*  ALBUMIN 2.0*   No results for  input(s): LIPASE, AMYLASE in the last 168 hours. No results for input(s): AMMONIA in the last 168 hours. CBC: Recent Labs  Lab 10/31/17 0336 11/02/17 0301 11/03/17 0455 11/04/17 0342  WBC 19.3* 12.5* 22.6* 15.2*  HGB 8.8* 7.9* 9.4* 8.0*  HCT 31.4* 27.8* 32.9* 27.9*  MCV 83.1 83.5 81.8 80.9  PLT 226 231 303 261   Cardiac Enzymes: No results for input(s): CKTOTAL, CKMB, CKMBINDEX, TROPONINI in the last 168 hours. BNP: BNP (last 3 results) Recent Labs    10/26/17 1056  BNP 231.1*    ProBNP (last 3 results) No results for input(s): PROBNP in the last 8760 hours.  CBG: Recent Labs  Lab 11/05/17 1532 11/05/17 1935 11/05/17 2340 11/06/17 0328 11/06/17 0735  GLUCAP 109* 101* 99 91 120*       Signed:  Oswald Hillock MD.  [facility] 11/06/2017, 9:58 AM

## 2017-11-09 LAB — CULTURE, RESPIRATORY

## 2017-11-09 LAB — CULTURE, RESPIRATORY W GRAM STAIN

## 2018-02-22 ENCOUNTER — Emergency Department (HOSPITAL_COMMUNITY): Payer: Medicare Other

## 2018-02-22 ENCOUNTER — Encounter (HOSPITAL_COMMUNITY): Payer: Self-pay | Admitting: Emergency Medicine

## 2018-02-22 ENCOUNTER — Inpatient Hospital Stay (HOSPITAL_COMMUNITY)
Admission: EM | Admit: 2018-02-22 | Discharge: 2018-03-29 | DRG: 870 | Disposition: E | Payer: Medicare Other | Attending: Pulmonary Disease | Admitting: Pulmonary Disease

## 2018-02-22 DIAGNOSIS — J9621 Acute and chronic respiratory failure with hypoxia: Secondary | ICD-10-CM | POA: Diagnosis present

## 2018-02-22 DIAGNOSIS — D649 Anemia, unspecified: Secondary | ICD-10-CM | POA: Diagnosis not present

## 2018-02-22 DIAGNOSIS — T17990A Other foreign object in respiratory tract, part unspecified in causing asphyxiation, initial encounter: Secondary | ICD-10-CM | POA: Diagnosis not present

## 2018-02-22 DIAGNOSIS — J95811 Postprocedural pneumothorax: Secondary | ICD-10-CM | POA: Diagnosis not present

## 2018-02-22 DIAGNOSIS — Z9911 Dependence on respirator [ventilator] status: Secondary | ICD-10-CM | POA: Diagnosis not present

## 2018-02-22 DIAGNOSIS — R0603 Acute respiratory distress: Secondary | ICD-10-CM

## 2018-02-22 DIAGNOSIS — I9589 Other hypotension: Secondary | ICD-10-CM | POA: Diagnosis present

## 2018-02-22 DIAGNOSIS — I471 Supraventricular tachycardia: Secondary | ICD-10-CM | POA: Diagnosis not present

## 2018-02-22 DIAGNOSIS — Z7951 Long term (current) use of inhaled steroids: Secondary | ICD-10-CM | POA: Diagnosis not present

## 2018-02-22 DIAGNOSIS — R0602 Shortness of breath: Secondary | ICD-10-CM

## 2018-02-22 DIAGNOSIS — J9601 Acute respiratory failure with hypoxia: Secondary | ICD-10-CM | POA: Diagnosis not present

## 2018-02-22 DIAGNOSIS — Y95 Nosocomial condition: Secondary | ICD-10-CM | POA: Diagnosis present

## 2018-02-22 DIAGNOSIS — Z931 Gastrostomy status: Secondary | ICD-10-CM

## 2018-02-22 DIAGNOSIS — Z515 Encounter for palliative care: Secondary | ICD-10-CM | POA: Diagnosis not present

## 2018-02-22 DIAGNOSIS — Z6824 Body mass index (BMI) 24.0-24.9, adult: Secondary | ICD-10-CM

## 2018-02-22 DIAGNOSIS — J95851 Ventilator associated pneumonia: Secondary | ICD-10-CM | POA: Diagnosis present

## 2018-02-22 DIAGNOSIS — J9690 Respiratory failure, unspecified, unspecified whether with hypoxia or hypercapnia: Secondary | ICD-10-CM

## 2018-02-22 DIAGNOSIS — J189 Pneumonia, unspecified organism: Secondary | ICD-10-CM

## 2018-02-22 DIAGNOSIS — J9622 Acute and chronic respiratory failure with hypercapnia: Secondary | ICD-10-CM | POA: Diagnosis present

## 2018-02-22 DIAGNOSIS — Z86711 Personal history of pulmonary embolism: Secondary | ICD-10-CM

## 2018-02-22 DIAGNOSIS — Z79899 Other long term (current) drug therapy: Secondary | ICD-10-CM | POA: Diagnosis not present

## 2018-02-22 DIAGNOSIS — E8801 Alpha-1-antitrypsin deficiency: Secondary | ICD-10-CM | POA: Diagnosis not present

## 2018-02-22 DIAGNOSIS — L8990 Pressure ulcer of unspecified site, unspecified stage: Secondary | ICD-10-CM | POA: Diagnosis present

## 2018-02-22 DIAGNOSIS — F411 Generalized anxiety disorder: Secondary | ICD-10-CM | POA: Diagnosis present

## 2018-02-22 DIAGNOSIS — E119 Type 2 diabetes mellitus without complications: Secondary | ICD-10-CM | POA: Diagnosis not present

## 2018-02-22 DIAGNOSIS — Z66 Do not resuscitate: Secondary | ICD-10-CM | POA: Diagnosis present

## 2018-02-22 DIAGNOSIS — J151 Pneumonia due to Pseudomonas: Secondary | ICD-10-CM | POA: Diagnosis not present

## 2018-02-22 DIAGNOSIS — E872 Acidosis: Secondary | ICD-10-CM | POA: Diagnosis not present

## 2018-02-22 DIAGNOSIS — J440 Chronic obstructive pulmonary disease with acute lower respiratory infection: Secondary | ICD-10-CM | POA: Diagnosis not present

## 2018-02-22 DIAGNOSIS — A419 Sepsis, unspecified organism: Secondary | ICD-10-CM | POA: Diagnosis present

## 2018-02-22 DIAGNOSIS — R0902 Hypoxemia: Secondary | ICD-10-CM

## 2018-02-22 DIAGNOSIS — M25571 Pain in right ankle and joints of right foot: Secondary | ICD-10-CM | POA: Diagnosis present

## 2018-02-22 DIAGNOSIS — I10 Essential (primary) hypertension: Secondary | ICD-10-CM | POA: Diagnosis not present

## 2018-02-22 DIAGNOSIS — E669 Obesity, unspecified: Secondary | ICD-10-CM | POA: Diagnosis not present

## 2018-02-22 DIAGNOSIS — Z87891 Personal history of nicotine dependence: Secondary | ICD-10-CM

## 2018-02-22 DIAGNOSIS — Z930 Tracheostomy status: Secondary | ICD-10-CM

## 2018-02-22 DIAGNOSIS — Z91048 Other nonmedicinal substance allergy status: Secondary | ICD-10-CM

## 2018-02-22 DIAGNOSIS — Z7401 Bed confinement status: Secondary | ICD-10-CM

## 2018-02-22 DIAGNOSIS — Z7901 Long term (current) use of anticoagulants: Secondary | ICD-10-CM

## 2018-02-22 DIAGNOSIS — Z888 Allergy status to other drugs, medicaments and biological substances status: Secondary | ICD-10-CM

## 2018-02-22 DIAGNOSIS — Z9689 Presence of other specified functional implants: Secondary | ICD-10-CM

## 2018-02-22 DIAGNOSIS — J939 Pneumothorax, unspecified: Secondary | ICD-10-CM

## 2018-02-22 DIAGNOSIS — Z794 Long term (current) use of insulin: Secondary | ICD-10-CM

## 2018-02-22 DIAGNOSIS — Z91018 Allergy to other foods: Secondary | ICD-10-CM

## 2018-02-22 DIAGNOSIS — Z91038 Other insect allergy status: Secondary | ICD-10-CM

## 2018-02-22 DIAGNOSIS — Z91013 Allergy to seafood: Secondary | ICD-10-CM

## 2018-02-22 LAB — CBC WITH DIFFERENTIAL/PLATELET
Basophils Absolute: 0 10*3/uL (ref 0.0–0.1)
Basophils Relative: 0 %
EOS PCT: 0 %
Eosinophils Absolute: 0 10*3/uL (ref 0.0–0.5)
HEMATOCRIT: 38.9 % (ref 36.0–46.0)
HEMOGLOBIN: 10.6 g/dL — AB (ref 12.0–15.0)
LYMPHS ABS: 1 10*3/uL (ref 0.7–4.0)
LYMPHS PCT: 3 %
MCH: 22 pg — ABNORMAL LOW (ref 26.0–34.0)
MCHC: 27.2 g/dL — AB (ref 30.0–36.0)
MCV: 80.9 fL (ref 80.0–100.0)
MONO ABS: 0.6 10*3/uL (ref 0.1–1.0)
MONOS PCT: 2 %
Neutro Abs: 30.4 10*3/uL — ABNORMAL HIGH (ref 1.7–7.7)
Neutrophils Relative %: 95 %
Platelets: 240 10*3/uL (ref 150–400)
RBC: 4.81 MIL/uL (ref 3.87–5.11)
RDW: 16.7 % — ABNORMAL HIGH (ref 11.5–15.5)
WBC: 32 10*3/uL — ABNORMAL HIGH (ref 4.0–10.5)
nRBC: 0 % (ref 0.0–0.2)
nRBC: 0 /100 WBC

## 2018-02-22 LAB — COMPREHENSIVE METABOLIC PANEL
ALBUMIN: 3.2 g/dL — AB (ref 3.5–5.0)
ALT: 63 U/L — AB (ref 0–44)
AST: 51 U/L — AB (ref 15–41)
Alkaline Phosphatase: 145 U/L — ABNORMAL HIGH (ref 38–126)
Anion gap: 10 (ref 5–15)
BUN: 27 mg/dL — ABNORMAL HIGH (ref 6–20)
CHLORIDE: 98 mmol/L (ref 98–111)
CO2: 30 mmol/L (ref 22–32)
CREATININE: 0.9 mg/dL (ref 0.44–1.00)
Calcium: 10 mg/dL (ref 8.9–10.3)
GFR calc non Af Amer: >60 mL/min (ref >60)
GLUCOSE: 141 mg/dL — AB (ref 70–99)
Potassium: 4.1 mmol/L (ref 3.5–5.1)
SODIUM: 138 mmol/L (ref 135–145)
Total Bilirubin: 0.6 mg/dL (ref 0.3–1.2)
Total Protein: 7.1 g/dL (ref 6.5–8.1)

## 2018-02-22 LAB — GLUCOSE, CAPILLARY: Glucose-Capillary: 203 mg/dL — ABNORMAL HIGH (ref 70–99)

## 2018-02-22 LAB — I-STAT ARTERIAL BLOOD GAS, ED
Acid-Base Excess: 6 mmol/L — ABNORMAL HIGH (ref 0.0–2.0)
Bicarbonate: 32.6 mmol/L — ABNORMAL HIGH (ref 20.0–28.0)
O2 Saturation: 97 %
Patient temperature: 97.2
TCO2: 34 mmol/L — ABNORMAL HIGH (ref 22–32)
pCO2 arterial: 54.3 mmHg — ABNORMAL HIGH (ref 32.0–48.0)
pH, Arterial: 7.384 (ref 7.350–7.450)
pO2, Arterial: 95 mmHg (ref 83.0–108.0)

## 2018-02-22 LAB — MRSA PCR SCREENING: MRSA BY PCR: NEGATIVE

## 2018-02-22 LAB — I-STAT CG4 LACTIC ACID, ED: Lactic Acid, Venous: 1.44 mmol/L (ref 0.5–1.9)

## 2018-02-22 MED ORDER — VANCOMYCIN HCL IN DEXTROSE 750-5 MG/150ML-% IV SOLN
750.0000 mg | Freq: Two times a day (BID) | INTRAVENOUS | Status: DC
  Filled 2018-02-22: qty 150

## 2018-02-22 MED ORDER — MORPHINE SULFATE (PF) 2 MG/ML IV SOLN
1.0000 mg | INTRAVENOUS | Status: DC | PRN
  Administered 2018-02-23 (×2): 2 mg via INTRAVENOUS
  Filled 2018-02-22 (×2): qty 1

## 2018-02-22 MED ORDER — ONDANSETRON HCL 4 MG PO TABS: 4.0000 mg | ORAL_TABLET | Freq: Four times a day (QID) | ORAL | Status: DC | PRN

## 2018-02-22 MED ORDER — CHLORHEXIDINE GLUCONATE 0.12% ORAL RINSE (MEDLINE KIT)
15.0000 mL | Freq: Two times a day (BID) | OROMUCOSAL | Status: DC
  Administered 2018-02-22 – 2018-03-01 (×14): 15 mL via OROMUCOSAL

## 2018-02-22 MED ORDER — LEVALBUTEROL HCL 0.63 MG/3ML IN NEBU
0.6300 mg | INHALATION_SOLUTION | Freq: Once | RESPIRATORY_TRACT | Status: AC
  Administered 2018-02-22: 0.63 mg via RESPIRATORY_TRACT
  Filled 2018-02-22: qty 3

## 2018-02-22 MED ORDER — ALTEPLASE 2 MG IJ SOLR
2.0000 mg | Freq: Once | INTRAMUSCULAR | Status: AC
  Administered 2018-02-22: 2 mg
  Filled 2018-02-22: qty 2

## 2018-02-22 MED ORDER — BUDESONIDE 0.5 MG/2ML IN SUSP
0.5000 mg | Freq: Two times a day (BID) | RESPIRATORY_TRACT | Status: DC
  Administered 2018-02-22 – 2018-03-01 (×14): 0.5 mg via RESPIRATORY_TRACT
  Filled 2018-02-22 (×15): qty 2

## 2018-02-22 MED ORDER — ALBUTEROL SULFATE (2.5 MG/3ML) 0.083% IN NEBU: 5.0000 mg | INHALATION_SOLUTION | Freq: Once | RESPIRATORY_TRACT | Status: DC

## 2018-02-22 MED ORDER — APIXABAN 5 MG PO TABS
5.0000 mg | ORAL_TABLET | Freq: Two times a day (BID) | ORAL | Status: DC
  Administered 2018-02-22 – 2018-02-23 (×2): 5 mg
  Filled 2018-02-22 (×2): qty 1

## 2018-02-22 MED ORDER — LEVALBUTEROL HCL 0.63 MG/3ML IN NEBU
0.6300 mg | INHALATION_SOLUTION | RESPIRATORY_TRACT | Status: DC | PRN
  Administered 2018-02-23 – 2018-02-27 (×4): 0.63 mg via RESPIRATORY_TRACT
  Filled 2018-02-22 (×5): qty 3

## 2018-02-22 MED ORDER — SODIUM CHLORIDE 0.9% FLUSH: 10.0000 mL | INTRAVENOUS | Status: DC | PRN

## 2018-02-22 MED ORDER — ACETAMINOPHEN 325 MG PO TABS
650.0000 mg | ORAL_TABLET | Freq: Four times a day (QID) | ORAL | Status: DC | PRN
  Administered 2018-02-22 – 2018-02-27 (×2): 650 mg via ORAL
  Filled 2018-02-22 (×2): qty 2

## 2018-02-22 MED ORDER — SODIUM CHLORIDE 0.9 % IV SOLN
2.0000 g | Freq: Two times a day (BID) | INTRAVENOUS | Status: DC
  Filled 2018-02-22: qty 2

## 2018-02-22 MED ORDER — IPRATROPIUM-ALBUTEROL 0.5-2.5 (3) MG/3ML IN SOLN
RESPIRATORY_TRACT | Status: AC
  Administered 2018-02-22: 3 mL via RESPIRATORY_TRACT
  Filled 2018-02-22: qty 3

## 2018-02-22 MED ORDER — SODIUM CHLORIDE 0.9 % IV BOLUS
1000.0000 mL | Freq: Once | INTRAVENOUS | Status: AC
  Administered 2018-02-22: 1000 mL via INTRAVENOUS

## 2018-02-22 MED ORDER — PREDNISONE 20 MG PO TABS
40.0000 mg | ORAL_TABLET | Freq: Every day | ORAL | Status: DC
  Filled 2018-02-22: qty 2

## 2018-02-22 MED ORDER — IPRATROPIUM-ALBUTEROL 0.5-2.5 (3) MG/3ML IN SOLN
3.0000 mL | Freq: Four times a day (QID) | RESPIRATORY_TRACT | Status: DC
  Administered 2018-02-22 – 2018-03-01 (×28): 3 mL via RESPIRATORY_TRACT
  Filled 2018-02-22 (×27): qty 3

## 2018-02-22 MED ORDER — CHLORHEXIDINE GLUCONATE CLOTH 2 % EX PADS
6.0000 | MEDICATED_PAD | Freq: Every day | CUTANEOUS | Status: DC
  Administered 2018-02-22 – 2018-02-26 (×4): 6 via TOPICAL

## 2018-02-22 MED ORDER — ALPHA1-PROTEINASE INHIBITOR 1000 MG/20ML IV SOLN: 4964.0000 mg | INTRAVENOUS | Status: DC

## 2018-02-22 MED ORDER — IPRATROPIUM BROMIDE 0.02 % IN SOLN: 0.5000 mg | Freq: Once | RESPIRATORY_TRACT | Status: DC

## 2018-02-22 MED ORDER — LEVALBUTEROL HCL 0.63 MG/3ML IN NEBU
INHALATION_SOLUTION | RESPIRATORY_TRACT | Status: AC
  Administered 2018-02-22: 0.63 mg
  Filled 2018-02-22: qty 3

## 2018-02-22 MED ORDER — VANCOMYCIN HCL IN DEXTROSE 1-5 GM/200ML-% IV SOLN: 1000.0000 mg | INTRAVENOUS | Status: DC

## 2018-02-22 MED ORDER — LORAZEPAM 2 MG/ML IJ SOLN
1.0000 mg | INTRAMUSCULAR | Status: DC | PRN
  Administered 2018-02-23 (×2): 2 mg via INTRAVENOUS
  Filled 2018-02-22: qty 1

## 2018-02-22 MED ORDER — ONDANSETRON HCL 4 MG/2ML IJ SOLN
4.0000 mg | Freq: Once | INTRAMUSCULAR | Status: AC
  Administered 2018-02-22: 4 mg via INTRAVENOUS
  Filled 2018-02-22: qty 2

## 2018-02-22 MED ORDER — INSULIN ASPART 100 UNIT/ML ~~LOC~~ SOLN
0.0000 [IU] | SUBCUTANEOUS | Status: DC
  Administered 2018-02-23: 2 [IU] via SUBCUTANEOUS
  Administered 2018-02-23: 5 [IU] via SUBCUTANEOUS
  Administered 2018-02-23 (×2): 1 [IU] via SUBCUTANEOUS
  Administered 2018-02-23: 3 [IU] via SUBCUTANEOUS
  Administered 2018-02-23: 2 [IU] via SUBCUTANEOUS
  Administered 2018-02-23: 3 [IU] via SUBCUTANEOUS
  Administered 2018-02-24: 5 [IU] via SUBCUTANEOUS

## 2018-02-22 MED ORDER — VANCOMYCIN HCL IN DEXTROSE 1-5 GM/200ML-% IV SOLN
1000.0000 mg | Freq: Once | INTRAVENOUS | Status: AC
  Administered 2018-02-22: 1000 mg via INTRAVENOUS
  Filled 2018-02-22: qty 200

## 2018-02-22 MED ORDER — ACETAMINOPHEN 325 MG PO TABS
650.0000 mg | ORAL_TABLET | Freq: Once | ORAL | Status: AC
  Administered 2018-02-22: 650 mg via ORAL
  Filled 2018-02-22: qty 2

## 2018-02-22 MED ORDER — PAROXETINE HCL 30 MG PO TABS
30.0000 mg | ORAL_TABLET | Freq: Every day | ORAL | Status: DC
  Administered 2018-02-23 – 2018-03-01 (×7): 30 mg
  Filled 2018-02-22 (×7): qty 1

## 2018-02-22 MED ORDER — INSULIN ASPART 100 UNIT/ML ~~LOC~~ SOLN: 0.0000 [IU] | Freq: Three times a day (TID) | SUBCUTANEOUS | Status: DC

## 2018-02-22 MED ORDER — LEVALBUTEROL HCL 0.63 MG/3ML IN NEBU: 0.6300 mg | INHALATION_SOLUTION | Freq: Once | RESPIRATORY_TRACT | Status: DC

## 2018-02-22 MED ORDER — ENOXAPARIN SODIUM 40 MG/0.4ML ~~LOC~~ SOLN: 40.0000 mg | SUBCUTANEOUS | Status: DC

## 2018-02-22 MED ORDER — ORAL CARE MOUTH RINSE
15.0000 mL | OROMUCOSAL | Status: DC
  Administered 2018-02-22 – 2018-03-01 (×67): 15 mL via OROMUCOSAL

## 2018-02-22 MED ORDER — QUETIAPINE FUMARATE 100 MG PO TABS
100.0000 mg | ORAL_TABLET | Freq: Two times a day (BID) | ORAL | Status: DC
  Administered 2018-02-22: 100 mg
  Filled 2018-02-22: qty 1

## 2018-02-22 MED ORDER — FAMOTIDINE 40 MG/5ML PO SUSR
20.0000 mg | Freq: Two times a day (BID) | ORAL | Status: DC
  Administered 2018-02-22 – 2018-03-01 (×14): 20 mg
  Filled 2018-02-22 (×15): qty 2.5

## 2018-02-22 MED ORDER — SODIUM CHLORIDE 0.9 % IV SOLN
2.0000 g | Freq: Once | INTRAVENOUS | Status: AC
  Administered 2018-02-22: 2 g via INTRAVENOUS
  Filled 2018-02-22: qty 2

## 2018-02-22 MED ORDER — ONDANSETRON HCL 4 MG/2ML IJ SOLN: 4.0000 mg | Freq: Four times a day (QID) | INTRAMUSCULAR | Status: DC | PRN

## 2018-02-22 MED ORDER — CLONAZEPAM 1 MG PO TABS
1.0000 mg | ORAL_TABLET | Freq: Three times a day (TID) | ORAL | Status: DC
  Administered 2018-02-22 – 2018-02-23 (×2): 1 mg
  Filled 2018-02-22 (×2): qty 1

## 2018-02-22 MED ORDER — ROPINIROLE HCL 0.5 MG PO TABS
0.5000 mg | ORAL_TABLET | Freq: Every day | ORAL | Status: DC
  Administered 2018-02-22 – 2018-02-28 (×7): 0.5 mg
  Filled 2018-02-22 (×8): qty 1

## 2018-02-22 NOTE — ED Triage Notes (Signed)
Pt here from [facility] with resp distress, pt is a chronic trach/vent , HR 150's  On arrival ,

## 2018-02-22 NOTE — ED Notes (Signed)
Pt's daughter Joice Lofts- call for updates [phone]

## 2018-02-22 NOTE — Progress Notes (Addendum)
Pharmacy Antibiotic Note  Danielle Stevens is a 47 y.o. female admitted on 23-Mar-2018 with pneumonia.  Pharmacy has been consulted for cefepime dosing. SCr wnl. WBC 32.   Addendum:  Vanc was ordered per pharmacy tonight. Pt has got a dose x1 today at 1700. Scr is 0.9.   Plan: -Cefepime 2 gm IV Q 12 hours -Monitor CBC, renal fx, cultures and clinical progress - Vanc 750mg  IV q12     Temp (24hrs), Avg:98.1 F (36.7 C), Min:97.2 F (36.2 C), Max:98.9 F (37.2 C)  Recent Labs  Lab 03/23/2018 1609 03-23-2018 1616  WBC 32.0*  --   CREATININE 0.90  --   LATICACIDVEN  --  1.44    CrCl cannot be calculated (Unknown ideal weight.).    Allergies  Allergen Reactions  . Wasp Venom Protein Shortness Of Breath  . Adhesive [Tape] Other (See Comments)    Bruises   . Coconut Oil Hives  . Latex Other (See Comments)    Bruises   . Robitussin Severe Multi-Symp [Phenylephrine-Dm-Gg-Apap] Diarrhea and Other (See Comments)    Allergic, per verbal MAR  . Shellfish-Derived Products Hives and Other (See Comments)    Allergic, per verbal MAR   Ulyses Southward, PharmD, BCIDP, AAHIVP, CPP Infectious Disease Pharmacist Pager: [phone] 2018-03-23 9:17 PM

## 2018-02-22 NOTE — Consult Note (Signed)
NAME:  Danielle Stevens, MRN:  [id number], DOB:  1970-07-01, LOS: 0 ADMISSION DATE:  01/30/2018, CONSULTATION DATE:  10/27 REFERRING MD:  Owens Shark CHIEF COMPLAINT:  Dyspnea   Brief History   Danielle Stevens is a 47 y.o. 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  She was admitted 10/27 with multifocal PNA.  Past Medical History  A1AT deficiency, COPD, chronic trach with vent dependence, HoTN, DM.  Significant Hospital Events   10/27 > admit.  Consults: date of consult/date signed off & final recs:  PCCM.  Procedures (surgical and bedside):  None.  Significant Diagnostic Tests:  CXR 10/27 > multifocal PNA.  Micro Data:  Blood 10/27 >  Sputum 10/27 >   Antimicrobials:  Vanc 10/27 >  Cefepime 10/27 >    Subjective:  Anxious on vent.  Objective:  Blood pressure 131/87, pulse (!) 123, temperature 98.9 F (37.2 C), temperature source Oral, resp. rate 17, weight 63.2 kg, SpO2 94 %.    Vent Mode: PCV FiO2 (%):  [40 %-50 %] 40 % Set Rate:  [14 bmp] 14 bmp PEEP:  [5 cmH20] 5 cmH20 Plateau Pressure:  [16 cmH20-210 cmH20] 19 cmH20   Intake/Output Summary (Last 24 hours) at 02/15/2018 2331 Last data filed at 01/31/2018 2000 Gross per 24 hour  Intake 981.23 ml  Output 150 ml  Net 831.23 ml   Filed Weights   02/20/2018 2100  Weight: 63.2 kg    Examination: General: Middle aged female, anxious. Neuro: Awake, mouths words, no deficits. HEENT: [place]/AT. Sclerae anicteric.  Trach C/D/I. Cardiovascular: RRR, no M/R/G.  Lungs: Respirations mildly labored.  CTA bilaterally, No W/R/R.  Abdomen: PEG in place.  BS x 4, soft, NT/ND.  Musculoskeletal: No gross deformities, no edema.  Skin: Intact, warm, no rashes.  Assessment & Plan:   Acute on chronic hypoxic / hypercapnic respiratory failure and A1AT deficiency with severe COPD - s/p tracheostomy. - Continue vent support at baseline settings. - No  weaning. - Continue BD's, steroids. - Continue weekly Prolastin (on Fridays). - PRN low dose morphine and ativan for anxiety / air hunger.  Multifocal PNA. - Continue empiric abx and follow cultures.  Hx PE (on eliquis). - Continue preadmission eliquis.  Rest per primary team.   Disposition / Summary of Today's Plan 02/03/2018   SDU under FMTS.    Diet: Tube feeds. Pain/Anxiety/Delirium protocol (if indicated): N/A. VAP protocol (if indicated): N/A. DVT prophylaxis: SCD's / eliquis. GI prophylaxis: Famotidine. Hyperglycemia protocol: SSI. Mobility: Bedrest. Code Status: Full. Family Communication: None available.  Labs   CBC: Recent Labs  Lab 02/03/2018 1609  WBC 32.0*  NEUTROABS 30.4*  HGB 10.6*  HCT 38.9  MCV 80.9  PLT 017   Basic Metabolic Panel: Recent Labs  Lab 02/14/2018 1609  NA 138  K 4.1  CL 98  CO2 30  GLUCOSE 141*  BUN 27*  CREATININE 0.90  CALCIUM 10.0   GFR: Estimated Creatinine Clearance: 65.3 mL/min (by C-G formula based on SCr of 0.9 mg/dL). Recent Labs  Lab 02/24/2018 1609 02/02/2018 1616  WBC 32.0*  --   LATICACIDVEN  --  1.44   Liver Function Tests: Recent Labs  Lab 02/08/2018 1609  AST 51*  ALT 63*  ALKPHOS 145*  BILITOT 0.6  PROT 7.1  ALBUMIN 3.2*   No results for input(s): LIPASE, AMYLASE in the last 168 hours. No results for input(s): AMMONIA in the last 168  hours. ABG    Component Value Date/Time   PHART 7.384 02/02/2018 1544   PCO2ART 54.3 (H) 02/16/2018 1544   PO2ART 95.0 02/19/2018 1544   HCO3 32.6 (H) 02/12/2018 1544   TCO2 34 (H) 02/25/2018 1544   O2SAT 97.0 02/07/2018 1544    Coagulation Profile: No results for input(s): INR, PROTIME in the last 168 hours. Cardiac Enzymes: No results for input(s): CKTOTAL, CKMB, CKMBINDEX, TROPONINI in the last 168 hours. HbA1C: Hgb A1c MFr Bld  Date/Time Value Ref Range Status  09/12/2017 05:17 AM 5.1 4.8 - 5.6 % Final    Comment:    (NOTE) Pre diabetes:           5.7%-6.4% Diabetes:              >6.4% Glycemic control for   <7.0% adults with diabetes    CBG: Recent Labs  Lab 02/21/2018 1832  GLUCAP 203*    Admitting History of Present Illness.   Danielle Stevens is a 47 y.o. female who has a PMH as outlined below including but not limited to tracheostomy and chronic ventilator dependence secondary to COPD with A1AT deficiency (not a candidate for transplant at [facility] in the past) and obesity.  She resides at [place].  She was admitted 10/27 with multifocal PNA.    PCCM consulted for assistance with vent management.  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  Review of Systems:   Unable to obtain as pt is on mechanical ventilation.  Past medical history  She,  has a past medical history of Alpha-1-antitrypsin deficiency ([place]), Anemia, Anxiety, Asthma, COPD (chronic obstructive pulmonary disease) ([place]), Diabetes mellitus without complication ([place]), Emphysema lung ([place]), and Hypertension.   Surgical History    Past Surgical History:  Procedure Laterality Date  . PEG TUBE PLACEMENT    . PORT-A-CATH REMOVAL Left 08/26/2017   Procedure: REMOVAL PORT-A-CATH;  Surgeon: Donnie Mesa, MD;  Location: [facility];  Service: General;  Laterality: Left;  . PORTACATH PLACEMENT Right   . TRACHEOSTOMY       Social History   Social History   Socioeconomic History  . Marital status: Single    Spouse name: Not on file  . Number of children: Not on file  . Years of education: Not on file  . Highest education level: Not on file  Occupational History  . Not on file  Social Needs  . Financial resource strain: Not on file  . Food insecurity:    Worry: Not on file    Inability: Not on file  . Transportation needs:    Medical: Not on file    Non-medical: Not on file  Tobacco Use  . Smoking status: Former Smoker    Types: Cigarettes  . Smokeless tobacco: Never Used  Substance  and Sexual Activity  . Alcohol use: Not Currently  . Drug use: Never  . Sexual activity: Not on file  Lifestyle  . Physical activity:    Days per week: Not on file    Minutes per session: Not on file  . Stress: Not on file  Relationships  . Social connections:    Talks on phone: Not on file    Gets together: Not on file    Attends religious service: Not on file    Active member of club or organization: Not on file    Attends meetings of clubs or organizations: Not on file  Relationship status: Not on file  . Intimate partner violence:    Fear of current or ex partner: Not on file    Emotionally abused: Not on file    Physically abused: Not on file    Forced sexual activity: Not on file  Other Topics Concern  . Not on file  Social History Narrative  . Not on file  ,  reports that she has quit smoking. Her smoking use included cigarettes. She has never used smokeless tobacco. She reports that she drank alcohol. She reports that she does not use drugs.   Family history   Her family history is not on file.   Allergies Allergies  Allergen Reactions  . Wasp Venom Protein Shortness Of Breath  . Adhesive [Tape] Other (See Comments)    Bruises   . Coconut Oil Hives  . Latex Other (See Comments)    Bruises   . Robitussin Severe Multi-Symp [Phenylephrine-Dm-Gg-Apap] Diarrhea and Other (See Comments)    Allergic, per verbal MAR  . Shellfish-Derived Products Hives and Other (See Comments)    Allergic, per verbal MAR    Home meds  Prior to Admission medications   Medication Sig Start Date End Date Taking? Authorizing Provider  acetaminophen (TYLENOL) 325 MG tablet Place 650 mg into feeding tube every 6 (six) hours.   Yes [provider]  albuterol (PROVENTIL) (2.5 MG/3ML) 0.083% nebulizer solution Take 3 mLs (2.5 mg total) by nebulization every 6 (six) hours. 11/06/17  Yes Oswald Hillock, MD  alpha-1-proteinase inhibitor, human, (PROLASTIN) 1000 MG/20ML SOLN Inject 4,964  mg into the vein once a week. 10/24/17  Yes Cherene Altes, MD  apixaban (ELIQUIS) 5 MG TABS tablet Place 1 tablet (5 mg total) into feeding tube 2 (two) times daily. 11/06/17  Yes Lama, Marge Duncans, MD  budesonide (PULMICORT) 0.5 MG/2ML nebulizer solution Take 2 mLs (0.5 mg total) by nebulization 2 (two) times daily. 10/20/17  Yes Cherene Altes, MD  chlorhexidine gluconate, MEDLINE KIT, (PERIDEX) 0.12 % solution 15 mLs by Mouth Rinse route 2 (two) times daily. 10/20/17  Yes Cherene Altes, MD  clonazePAM (KLONOPIN) 1 MG tablet Place 1 tablet (1 mg total) into feeding tube every 8 (eight) hours. 10/20/17  Yes Cherene Altes, MD  diphenhydrAMINE (BENADRYL) 25 mg capsule Place 25 mg into feeding tube every 8 (eight) hours as needed for itching.   Yes [provider]  famotidine (PEPCID) 40 MG/5ML suspension Place 2.5 mLs (20 mg total) into feeding tube every 12 (twelve) hours. 11/06/17  Yes Oswald Hillock, MD  furosemide (LASIX) 10 MG/ML injection Inject 2 mLs (20 mg total) into the vein every 12 (twelve) hours. 11/06/17  Yes Lama, Marge Duncans, MD  insulin aspart (NOVOLOG) 100 UNIT/ML injection Inject 0-15 Units into the skin every 4 (four) hours. Patient taking differently: Inject 0-20 Units into the skin every 6 (six) hours. Blood sugar <70 or >400 Notify MD. BS 70-120=0u, 121-150=3u, 151-200=4u, 201-250=7u, 251-300=11u, 301-350=15u, 351-400=20u 10/20/17  Yes Cherene Altes, MD  Lactobacillus (ACIDOPHILUS PO) Place 1 capsule into feeding tube daily.    Yes [provider]  levalbuterol (XOPENEX) 1.25 MG/3ML nebulizer solution Take 3 mLs by nebulization every 6 (six) hours. 02/06/2018  Yes [provider]  LORazepam (ATIVAN) 1 MG tablet Place 1 tablet (1 mg total) into feeding tube every 2 (two) hours as needed for anxiety. 10/20/17  Yes Cherene Altes, MD  metoCLOPramide (REGLAN) 5 MG/ML injection Inject 1 mL (5 mg  total) into the vein every 6 (six) hours. 11/06/17  Yes  Darrick Meigs, Marge Duncans, MD  midodrine (PROAMATINE) 5 MG tablet Place 1 tablet (5 mg total) into feeding tube 3 (three) times daily with meals. 10/20/17  Yes Cherene Altes, MD  Multiple Vitamin (MULTIVITAMIN WITH MINERALS) TABS tablet Place 1 tablet into feeding tube daily.    Yes [provider]  Nutritional Supplements (FEEDING SUPPLEMENT, VITAL HIGH PROTEIN,) LIQD liquid Place 1,000 mLs into feeding tube continuous. Patient taking differently: Place 230 mLs into feeding tube every 4 (four) hours. PEPTAMEN AF 11/06/17  Yes Darrick Meigs, Marge Duncans, MD  ondansetron [facility]) 4 MG/2ML SOLN injection Inject 2 mLs (4 mg total) into the vein every 6 (six) hours as needed for nausea or vomiting. 11/06/17  Yes Darrick Meigs, Marge Duncans, MD  oxyCODONE (ROXICODONE) 5 MG/5ML solution Place 5 mg into feeding tube every 6 (six) hours as needed for moderate pain.    Yes [provider]  pantoprazole sodium (PROTONIX) 40 mg/20 mL PACK Place 20 mLs (40 mg total) into feeding tube daily. 11/07/17  Yes Oswald Hillock, MD  PARoxetine (PAXIL) 30 MG tablet Place 1 tablet (30 mg total) into feeding tube daily. Patient taking differently: Place 40 mg into feeding tube daily.  10/20/17  Yes Cherene Altes, MD  PAZEO 0.7 % SOLN Place 1 drop into both eyes daily. 02/16/2018  Yes [provider]  polyethylene glycol (MIRALAX / GLYCOLAX) packet Place 17 g into feeding tube daily. 11/07/17  Yes Oswald Hillock, MD  predniSONE (DELTASONE) 20 MG tablet Place 1 tablet (20 mg total) into feeding tube daily with breakfast. Patient taking differently: Place 40 mg into feeding tube daily with breakfast.  11/07/17  Yes Darrick Meigs, Marge Duncans, MD  promethazine (PHENERGAN) 25 MG/ML injection Inject 0.5 mLs (12.5 mg total) into the vein every 6 (six) hours as needed for nausea or vomiting. 10/20/17  Yes Cherene Altes, MD  QUEtiapine (SEROQUEL) 100 MG tablet Place 1 tablet (100 mg total) into feeding tube 2 (two) times daily. Patient taking  differently: Place 100 mg into feeding tube every 12 (twelve) hours.  09/25/17  Yes Minor, Grace Bushy, NP  rOPINIRole (REQUIP) 0.5 MG tablet Place 1 tablet (0.5 mg total) into feeding tube at bedtime. 10/20/17  Yes Cherene Altes, MD  Sodium Chloride, Inhalant, 7 % NEBU Inhale 1 vial into the lungs every 6 (six) hours.   Yes [provider]  albuterol (PROVENTIL) (2.5 MG/3ML) 0.083% nebulizer solution Take 3 mLs (2.5 mg total) by nebulization every 2 (two) hours as needed for wheezing or shortness of breath. Patient not taking: Reported on 02/04/2018 11/06/17   Oswald Hillock, MD  Amino Acids-Protein Hydrolys (FEEDING SUPPLEMENT, PRO-STAT SUGAR FREE 64,) LIQD Place 30 mLs into feeding tube 2 (two) times daily. Patient not taking: Reported on 02/10/2018 11/06/17   Oswald Hillock, MD  mouth rinse LIQD solution 15 mLs by Mouth Rinse route 2 times daily at 12 noon and 4 pm. Patient not taking: Reported on 02/15/2018 11/06/17   Oswald Hillock, MD    Critical care time: 30 min.    Montey Hora, [place] [facility] Pager: [phone]  or [phone] 02/24/2018, 11:31 PM

## 2018-02-22 NOTE — H&P (Addendum)
[facility] Admission History and Physical Service Pager: [phone]  Patient name: Danielle Stevens Medical record number: [id number] Date of birth: 1970-10-04 Age: 47 y.o. Gender: female  Primary Care Provider: Patient, No Pcp Per Consultants: CCM Code Status: Full  Chief Complaint: Worsening shortness of breath  Assessment and Plan: Danielle Stevens is a 47 y.o. female presenting with worsening SOB found to have multifocal pneumonia. PMH is significant for Alpha-1 antitrypsin deficiency and COPD with ventilator dependent chronic respiratory failure with tracheostomy, T2DM, severe anxiety. History of Enterobacter and pseudomonas positive PNA and pulmonary embolism in June 2019.   Sepsis likely 2/2 Ventilator Associated Pneumonia: Patient is ventilator dependent with tracheostomy 2/2 to Alpha-1 antitrypsin deficiency and COPD unable to be weaned off of ventilator per chart review. CXR impressive for multifocal pneumonia, leukocytosis of 32, and tachycardia in the 150's. Received 2L bolus in ED with some improvement in HR. Lactic acid WNL. ABG consistent with compensated hypercapnic respiratory failure. History of enterobacter positive VAP in June 2019 and Pseudomanias positive PNA in July treated with Meropenem. Patient given IV cefepime and vancomycin while in ED. Worsening SOB unlikely PE as Well's Score 3 and on chronic Eliquis. SOB unlikely heart related as patient has no chest pain and EKG negative for acute process.  - Admit to step down in ICU, attending physician Dr. Owens Shark - Continue Cefepime; pharmacy to dose - Continue Vancomycin; pharmacy to dose - Continue Duonebs q6hrs - Continue Prolastin  - Continue ventilator - CCM to help manage - Continuous cardiac monitor - F/u AM labs: CBC, CMP, MRSA screening - Daily weights/I&O's - Monitor vitals per floor - nutrition consult to assist with tube feeds - NPO - Pepcid, Zofran PRN - Continue Eliquis - Hold home  Oxy  Tachycardia: Some aspect of tachycardia at baseline. HR upon admission in the 120's. Patient received albuterol and xopenex in ED. Sepsis and medications likely contributing to acute tachycardia. EKG showing sinus rhythm. Will continue to monitor and consider adding labetalol pending improvement. - Continue to monitor - repeat am ekg - cardiac monitoring  Elevated Transaminase: AST 51, ALT 63, Alk phos 145.  Likely due to acute process. Does admit to some RUQ abdominal pain to palpation and last BM this AM was green and runny. No jaundice noted on exam. Will continue to monitor, as she improves, can consider further workup. Consider GGT and bilirubin levels. - Continue to monitor  Chronic Hypotension: On home midodrine. BP normotensive at this time (824-235'T systollically). Will hold Midodrine and follow BP's. - Continue to monitor  Right ankle pain: Endorses pain along medial and dorsal ankle. Full ROM. 5/10 pain. Started yesterday suddenly without history of trauma or pain before. Will consider ankle x-ray tomorrow if still endorsing ankle pain. -Continue to monitor  Severe COPD / Alpha-1Antitrypsindeficiency / advanced lung disease / chronic tracheostomy  -Per chart review, not a transplant candidate per [facility] and [facility]. On home weekly Prolastin. Also on home Albuterol, Prednisone, and Pulmicort. - Continue home prolastin and prednisone - continue home Pulmicort - Begin Duonebs q6hrs   Chronically Ventillator dependent Per chart review, no plan to attempt weaning per PCCM. CCM following, will refer adjustments to them. Will continue to monitor respiratory status - Continue to monitor  History of Pulmonary Embolism: PE in June 2019. On chronic Eliquis x 6 months (through November).   - Continue home Eliquis  Chronic multifactorial normocytic anemia: Hgb 10.6 (BL 8-9). Hemoglobin stable. No evidence of bleeding on anticoagulation. Will continue  to monitor. - CBC  AM  T2DM: HgbA1C 5.1 (09/12/17). On Novolog SSI at home.  - Continue to monitor CBGs - SSI  Severe Anxiety:  Per paperwork in ED, on home Paxil, Seroquel, Ativan PRN, Klonopin, and Ropinirole. Patient is unsure what she takes. Will continue home meds except Ativan. Can add per patient request. - Continue home Paxil, Seroquel, ropinirole, and Klonopin   FEN/GI: NPO, Pepcid, Replace electrolytes PRN Prophylaxis: Lovenox  Disposition: Step down in ICU bed  History of Present Illness:  Danielle Stevens is a 47 y.o. female presenting from [place] with worsening SOB and cough. Patient is ventilator dependent with tracheostomy 2/2 to Alpha-1 antitrypsin deficiency and COPD.   Patient notes not feeling well on Friday. She noted some SOB and productive cough with Dena sputum starting then that has gradually worsened over the weekend. Today she reports feeling much worse and having a "hard time breathing" today which prompted [facility] to bring her to the ED. She denies any fevers, chills, abdominal pain, chest pain, LE edema, dizziness. She endorses vomiting on Friday after her tube feed, but has no recurrence of this.   On presentation, patient was tachycardic in the 150's but otherwise well appearing, afebrile and stable on her normal ventilator settings. Labs were significant for I-stat arterial blood gas with compensated hypercapnea of CO2 54.3, bicarb 32.6, pH 7.384. Cr 0.90, BUN 27, albumin 3.2, AST 51, ALT 63, Alk phos 145. WBC 32 with neutrophil predominance, lactic acid WNL, Hgb 10.6. Chest x-ray positive for multifocal pneumonia. Patient given IV cefepime and Vanc, 2L bolus fluid, 1 dose tylenol, Xopenex nebulizer and zofran. Blood culture pending. Arterial blood gas pending.   Review Of Systems: Per HPI with the following additions:   Review of Systems  Constitutional: Negative for chills, fever and malaise/fatigue.  Respiratory: Positive for cough, sputum production (clear) and  shortness of breath.   Cardiovascular: Negative for chest pain and leg swelling.  Gastrointestinal: Positive for vomiting. Negative for abdominal pain and nausea.  Neurological: Negative for dizziness.    Patient Active Problem List   Diagnosis Date Noted  . Sepsis ([place]) 01/31/2018  . Pneumothorax 10/26/2017  . Acute on chronic respiratory failure ([place]) 10/14/2017  . Goals of care, counseling/discussion   . Palliative care by specialist   . Generalized anxiety disorder   . Hypoxemia   . Tracheostomy status ([place])   . Alpha-1-antitrypsin deficiency ([place])   . Hypotension 08/29/2017  . Acute on chronic respiratory failure with hypoxia and hypercapnia ([facility])   . COPD exacerbation ([place])   . VAP (ventilator-associated pneumonia) ([place])   . Hypercarbia 08/22/2017    Past Medical History: Past Medical History:  Diagnosis Date  . Alpha-1-antitrypsin deficiency ([place])   . Anemia   . Anxiety   . Asthma   . COPD (chronic obstructive pulmonary disease) ([place])   . Diabetes mellitus without complication ([place])   . Emphysema lung ([place])   . Hypertension     Past Surgical History: Past Surgical History:  Procedure Laterality Date  . PEG TUBE PLACEMENT    . PORT-A-CATH REMOVAL Left 08/26/2017   Procedure: REMOVAL PORT-A-CATH;  Surgeon: Donnie Mesa, MD;  Location: [facility];  Service: General;  Laterality: Left;  . PORTACATH PLACEMENT Right   . TRACHEOSTOMY      Social History: Social History   Tobacco Use  . Smoking status: Former Smoker    Types: Cigarettes  . Smokeless tobacco: Never Used  Substance Use Topics  . Alcohol use:  Not Currently  . Drug use: Never   Additional social history: none Please also refer to relevant sections of EMR.  Family History: History reviewed. No pertinent family history.  Allergies and Medications: Allergies  Allergen Reactions  . Wasp Venom Protein Shortness Of Breath  . Adhesive [Tape] Other (See Comments)    Bruises   . Coconut Oil Hives   . Latex Other (See Comments)    Bruises   . Robitussin Severe Multi-Symp [Phenylephrine-Dm-Gg-Apap] Diarrhea and Other (See Comments)    Allergic, per verbal MAR  . Shellfish-Derived Products Hives and Other (See Comments)    Allergic, per verbal MAR   No current facility-administered medications on file prior to encounter.    Current Outpatient Medications on File Prior to Encounter  Medication Sig Dispense Refill  . acetaminophen (TYLENOL) 325 MG tablet Place 650 mg into feeding tube every 6 (six) hours.    Marland Kitchen albuterol (PROVENTIL) (2.5 MG/3ML) 0.083% nebulizer solution Take 3 mLs (2.5 mg total) by nebulization every 6 (six) hours. 75 mL 12  . alpha-1-proteinase inhibitor, human, (PROLASTIN) 1000 MG/20ML SOLN Inject 4,964 mg into the vein once a week.    Marland Kitchen apixaban (ELIQUIS) 5 MG TABS tablet Place 1 tablet (5 mg total) into feeding tube 2 (two) times daily. 60 tablet   . budesonide (PULMICORT) 0.5 MG/2ML nebulizer solution Take 2 mLs (0.5 mg total) by nebulization 2 (two) times daily.  12  . chlorhexidine gluconate, MEDLINE KIT, (PERIDEX) 0.12 % solution 15 mLs by Mouth Rinse route 2 (two) times daily. 120 mL 0  . clonazePAM (KLONOPIN) 1 MG tablet Place 1 tablet (1 mg total) into feeding tube every 8 (eight) hours. 30 tablet 0  . diphenhydrAMINE (BENADRYL) 25 mg capsule Place 25 mg into feeding tube every 8 (eight) hours as needed for itching.    . famotidine (PEPCID) 40 MG/5ML suspension Place 2.5 mLs (20 mg total) into feeding tube every 12 (twelve) hours. 50 mL 0  . furosemide (LASIX) 10 MG/ML injection Inject 2 mLs (20 mg total) into the vein every 12 (twelve) hours. 4 mL 0  . insulin aspart (NOVOLOG) 100 UNIT/ML injection Inject 0-15 Units into the skin every 4 (four) hours. (Patient taking differently: Inject 0-20 Units into the skin every 6 (six) hours. Blood sugar <70 or >400 Notify MD. BS 70-120=0u, 121-150=3u, 151-200=4u, 201-250=7u, 251-300=11u, 301-350=15u, 351-400=20u) 10 mL 11   . Lactobacillus (ACIDOPHILUS PO) Place 1 capsule into feeding tube daily.     Marland Kitchen levalbuterol (XOPENEX) 1.25 MG/3ML nebulizer solution Take 3 mLs by nebulization every 6 (six) hours.    Marland Kitchen LORazepam (ATIVAN) 1 MG tablet Place 1 tablet (1 mg total) into feeding tube every 2 (two) hours as needed for anxiety. 30 tablet 0  . metoCLOPramide (REGLAN) 5 MG/ML injection Inject 1 mL (5 mg total) into the vein every 6 (six) hours. 2 mL 0  . midodrine (PROAMATINE) 5 MG tablet Place 1 tablet (5 mg total) into feeding tube 3 (three) times daily with meals.    . Multiple Vitamin (MULTIVITAMIN WITH MINERALS) TABS tablet Place 1 tablet into feeding tube daily.     . Nutritional Supplements (FEEDING SUPPLEMENT, VITAL HIGH PROTEIN,) LIQD liquid Place 1,000 mLs into feeding tube continuous. (Patient taking differently: Place 230 mLs into feeding tube every 4 (four) hours. PEPTAMEN AF)    . ondansetron (ZOFRAN) 4 MG/2ML SOLN injection Inject 2 mLs (4 mg total) into the vein every 6 (six) hours as needed for nausea or vomiting. 2  mL 0  . oxyCODONE (ROXICODONE) 5 MG/5ML solution Place 5 mg into feeding tube every 6 (six) hours as needed for moderate pain.     . pantoprazole sodium (PROTONIX) 40 mg/20 mL PACK Place 20 mLs (40 mg total) into feeding tube daily. 30 each   . PARoxetine (PAXIL) 30 MG tablet Place 1 tablet (30 mg total) into feeding tube daily. (Patient taking differently: Place 40 mg into feeding tube daily. )    . PAZEO 0.7 % SOLN Place 1 drop into both eyes daily.    . polyethylene glycol (MIRALAX / GLYCOLAX) packet Place 17 g into feeding tube daily. 14 each 0  . predniSONE (DELTASONE) 20 MG tablet Place 1 tablet (20 mg total) into feeding tube daily with breakfast. (Patient taking differently: Place 40 mg into feeding tube daily with breakfast. )    . promethazine (PHENERGAN) 25 MG/ML injection Inject 0.5 mLs (12.5 mg total) into the vein every 6 (six) hours as needed for nausea or vomiting. 1 mL 0  .  QUEtiapine (SEROQUEL) 100 MG tablet Place 1 tablet (100 mg total) into feeding tube 2 (two) times daily. (Patient taking differently: Place 100 mg into feeding tube every 12 (twelve) hours. )    . rOPINIRole (REQUIP) 0.5 MG tablet Place 1 tablet (0.5 mg total) into feeding tube at bedtime.    . Sodium Chloride, Inhalant, 7 % NEBU Inhale 1 vial into the lungs every 6 (six) hours.    Marland Kitchen albuterol (PROVENTIL) (2.5 MG/3ML) 0.083% nebulizer solution Take 3 mLs (2.5 mg total) by nebulization every 2 (two) hours as needed for wheezing or shortness of breath. (Patient not taking: Reported on 02/17/2018) 75 mL 12  . Amino Acids-Protein Hydrolys (FEEDING SUPPLEMENT, PRO-STAT SUGAR FREE 64,) LIQD Place 30 mLs into feeding tube 2 (two) times daily. (Patient not taking: Reported on 01/27/2018) 900 mL 0  . mouth rinse LIQD solution 15 mLs by Mouth Rinse route 2 times daily at 12 noon and 4 pm. (Patient not taking: Reported on 01/27/2018)  0    Objective: BP (!) 149/86   Pulse 98   Temp 98.9 F (37.2 C) (Oral)   Resp 14   SpO2 97%  Physical Exam:  Gen: NAD, alert, non-toxic, well-appearing, sitting comfortably in bed Skin: Warm and dry. No obvious rashes, lesions, or trauma. No jaundice noted. Only small erythema noted around Peg site without concern for infection. Area around trach non-erythematous or edematous. No secretions noted. HEENT: NCAT No conjunctival pallor or injection. No scleral icterus or injection.  MMM.  CV: RRR.  <2s capillary refill bilaterally.  RP & DPs 2+ bilaterally. No BLEE. Resp: Good air flow throughout lungs. Crackles noted diffusely, primarily in middle and lower lobes bilaterally. No increased WOB Abd: Tender to palpation alogn RUQ.  Positive bowel sounds. Psych: Cooperative with exam. Pleasant. Makes eye contact. Unable to speak, but can communicate with whisper. Extremities: Moves all extremities spontaneously    Labs and Imaging: CBC BMET  Recent Labs  Lab 02/07/2018 1609   WBC 32.0*  HGB 10.6*  HCT 38.9  PLT 240   Recent Labs  Lab 01/31/2018 1609  NA 138  K 4.1  CL 98  CO2 30  BUN 27*  CREATININE 0.90  GLUCOSE 141*  CALCIUM 10.0     Dg Chest Portable 1 View  Result Date: 02/08/2018 CLINICAL DATA:  Pt states having trouble breathing for 2 days. Hx of emphysema, copd, asthma, and diabetes EXAM: PORTABLE CHEST 1 VIEW COMPARISON:  11/05/2017 FINDINGS: Tracheostomy tube is in place, tip unchanged approximately 6.7 centimeters above the carina. The balloon of the tracheostomy appears overinflated. RIGHT-sided PICC line tip overlies the superior vena cava. Lungs are hyperinflated. There is significant perihilar peribronchial thickening. Patchy ground-glass opacities are identified in the UPPER lobes bilaterally. There is LEFT LOWER lobe atelectasis or scarring. Trace bilateral pleural effusions. No pulmonary edema. IMPRESSION: 1. Hyperinflation and bronchitic changes. 2. Multifocal pneumonia. 3. Tracheostomy tube balloon appears overinflated relative to the diameter of the trachea. Electronically Signed   By: Nolon Nations M.D.   On: 01/28/2018 14:52    Danna Hefty, DO 01/30/2018, 7:56 PM PGY-1, [place] Intern pager: [phone], text pages welcome

## 2018-02-22 NOTE — ED Notes (Signed)
Spoke with pt placement in regards to admitting MD and bed request. Admitting MD notified.

## 2018-02-22 NOTE — ED Provider Notes (Signed)
  Physical Exam  BP 133/70   Pulse (!) 121   Temp (!) 97.2 F (36.2 C) (Temporal)   Resp 18   SpO2 99%   Physical Exam  ED Course/Procedures     Procedures  MDM  Care assumed at 3:30 pm from previous provider. Patient is from [facility] and had worsening cough and SOB for 2 days. Was noted to be tachycardic. Patient vent dependent and had no change in vent settings. CXR showed multifocal pneumonia. Sign out pending labs, lactate, sepsis workup and admission.   5:19 PM WBC 32. ABG reassuring. I called Dr. Marchelle Gearing from critical care. States that since patient has no change in vent settings, patient can get admitted to stepdown and they can consult for vent management as needed. Given vanc/cefepime. Family practice to admit   Angiocath insertion Performed by: Richardean Canal  Consent: Verbal consent obtained. Risks and benefits: risks, benefits and alternatives were discussed Time out: Immediately prior to procedure a "time out" was called to verify the correct patient, procedure, equipment, support staff and site/side marked as required.  Preparation: Patient was prepped and draped in the usual sterile fashion.  Vein Location: L antecube  Ultrasound Guided  Gauge: 20 long   Normal blood return and flush without difficulty Patient tolerance: Patient tolerated the procedure well with no immediate complications.         Charlynne Pander, MD 03-20-2018 [phone]

## 2018-02-22 NOTE — ED Provider Notes (Addendum)
[facility] EMERGENCY DEPARTMENT Provider Note   CSN: [id number] Arrival date & time: 02/12/2018  1423     History   Chief Complaint Chief Complaint  Patient presents with  . Respiratory Distress  Level 5 caveat respiratory distress  HPI Danielle Stevens is a 47 y.o. female.  HPI she transferred here from [facility]. patient reports that developed cough and shortness of breath 2 days ago..  She also complains of chest pain worse with coughing.  No other associated symptoms.  Transferred here she was noted to be tachycardic.  Past Medical History:  Diagnosis Date  . Alpha-1-antitrypsin deficiency ([place])   . Anemia   . Anxiety   . Asthma   . COPD (chronic obstructive pulmonary disease) ([place])   . Diabetes mellitus without complication ([place])   . Emphysema lung ([place])   . Hypertension     Patient Active Problem List   Diagnosis Date Noted  . Pneumothorax 10/26/2017  . Acute on chronic respiratory failure ([place]) 10/14/2017  . Goals of care, counseling/discussion   . Palliative care by specialist   . Generalized anxiety disorder   . Hypoxemia   . Tracheostomy status ([place])   . Alpha-1-antitrypsin deficiency ([place])   . Hypotension 08/29/2017  . Acute on chronic respiratory failure with hypoxia and hypercapnia (HCC)   . COPD exacerbation ([place])   . VAP (ventilator-associated pneumonia) ([place])   . Hypercarbia 08/22/2017    Past Surgical History:  Procedure Laterality Date  . PEG TUBE PLACEMENT    . PORT-A-CATH REMOVAL Left 08/26/2017   Procedure: REMOVAL PORT-A-CATH;  Surgeon: Donnie Mesa, MD;  Location: [facility];  Service: General;  Laterality: Left;  . PORTACATH PLACEMENT Right   . TRACHEOSTOMY       OB History   None      Home Medications    Prior to Admission medications   Medication Sig Start Date End Date Taking? Authorizing Provider  acetaminophen (TYLENOL) 325 MG tablet Place 650 mg into feeding tube every 6 (six) hours.    [provider]  albuterol (PROVENTIL) (2.5 MG/3ML) 0.083% nebulizer solution Take 3 mLs (2.5 mg total) by nebulization every 2 (two) hours as needed for wheezing or shortness of breath. 11/06/17   Oswald Hillock, MD  albuterol (PROVENTIL) (2.5 MG/3ML) 0.083% nebulizer solution Take 3 mLs (2.5 mg total) by nebulization every 6 (six) hours. 11/06/17   Oswald Hillock, MD  alpha-1-proteinase inhibitor, human, (PROLASTIN) 1000 MG/20ML SOLN Inject 4,964 mg into the vein once a week. 10/24/17   Cherene Altes, MD  Amino Acids-Protein Hydrolys (FEEDING SUPPLEMENT, PRO-STAT SUGAR FREE 64,) LIQD Place 30 mLs into feeding tube 2 (two) times daily. 11/06/17   Oswald Hillock, MD  apixaban (ELIQUIS) 5 MG TABS tablet Place 1 tablet (5 mg total) into feeding tube 2 (two) times daily. 11/06/17   Oswald Hillock, MD  budesonide (PULMICORT) 0.5 MG/2ML nebulizer solution Take 2 mLs (0.5 mg total) by nebulization 2 (two) times daily. 10/20/17   Cherene Altes, MD  chlorhexidine gluconate, MEDLINE KIT, (PERIDEX) 0.12 % solution 15 mLs by Mouth Rinse route 2 (two) times daily. 10/20/17   Cherene Altes, MD  clonazePAM (KLONOPIN) 1 MG tablet Place 1 tablet (1 mg total) into feeding tube every 8 (eight) hours. 10/20/17   Cherene Altes, MD  famotidine (PEPCID) 40 MG/5ML suspension Place 2.5 mLs (20 mg total) into feeding tube every 12 (twelve) hours. 11/06/17   Oswald Hillock, MD  furosemide (LASIX) 10 MG/ML injection Inject 2 mLs (20 mg total) into the vein every 12 (twelve) hours. 11/06/17   Oswald Hillock, MD  insulin aspart (NOVOLOG) 100 UNIT/ML injection Inject 0-15 Units into the skin every 4 (four) hours. Patient taking differently: Inject 0-20 Units into the skin every 6 (six) hours. Blood sugar <70 or >400 Notify MD. BS 70-120=0u, 121-150=3u, 151-200=4u, 201-250=7u, 251-300=11u, 301-350=15u, 351-400=20u 10/20/17   Cherene Altes, MD  Lactobacillus (ACIDOPHILUS PO) Place 1 capsule into feeding tube daily.      [provider]  LORazepam (ATIVAN) 1 MG tablet Place 1 tablet (1 mg total) into feeding tube every 2 (two) hours as needed for anxiety. 10/20/17   Cherene Altes, MD  metoCLOPramide (REGLAN) 5 MG/ML injection Inject 1 mL (5 mg total) into the vein every 6 (six) hours. 11/06/17   Oswald Hillock, MD  midodrine (PROAMATINE) 5 MG tablet Place 1 tablet (5 mg total) into feeding tube 3 (three) times daily with meals. 10/20/17   Cherene Altes, MD  mouth rinse LIQD solution 15 mLs by Mouth Rinse route 2 times daily at 12 noon and 4 pm. 11/06/17   Oswald Hillock, MD  Multiple Vitamin (MULTIVITAMIN WITH MINERALS) TABS tablet Place 1 tablet into feeding tube daily.     [provider]  Nutritional Supplements (FEEDING SUPPLEMENT, VITAL HIGH PROTEIN,) LIQD liquid Place 1,000 mLs into feeding tube continuous. 11/06/17   Oswald Hillock, MD  ondansetron (ZOFRAN) 4 MG/2ML SOLN injection Inject 2 mLs (4 mg total) into the vein every 6 (six) hours as needed for nausea or vomiting. 11/06/17   Oswald Hillock, MD  oxyCODONE (ROXICODONE) 5 MG/5ML solution Place 5 mg into feeding tube every 3 (three) hours.     [provider]  pantoprazole sodium (PROTONIX) 40 mg/20 mL PACK Place 20 mLs (40 mg total) into feeding tube daily. 11/07/17   Oswald Hillock, MD  PARoxetine (PAXIL) 30 MG tablet Place 1 tablet (30 mg total) into feeding tube daily. 10/20/17   Cherene Altes, MD  polyethylene glycol (MIRALAX / Floria Raveling) packet Place 17 g into feeding tube daily. 11/07/17   Oswald Hillock, MD  predniSONE (DELTASONE) 20 MG tablet Place 1 tablet (20 mg total) into feeding tube daily with breakfast. 11/07/17   Oswald Hillock, MD  promethazine (PHENERGAN) 25 MG/ML injection Inject 0.5 mLs (12.5 mg total) into the vein every 6 (six) hours as needed for nausea or vomiting. 10/20/17   Cherene Altes, MD  QUEtiapine (SEROQUEL) 100 MG tablet Place 1 tablet (100 mg total) into feeding tube 2 (two) times  daily. Patient taking differently: Place 100 mg into feeding tube every 12 (twelve) hours.  09/25/17   Minor, Grace Bushy, NP  rOPINIRole (REQUIP) 0.5 MG tablet Place 1 tablet (0.5 mg total) into feeding tube at bedtime. 10/20/17   Cherene Altes, MD    Family History History reviewed. No pertinent family history.  Social History Social History   Tobacco Use  . Smoking status: Former Smoker    Types: Cigarettes  . Smokeless tobacco: Never Used  Substance Use Topics  . Alcohol use: Not Currently  . Drug use: Never   Sides at [facility],  Allergies   Wasp venom protein; Adhesive [tape]; Coconut oil; Latex; Robitussin severe multi-symp [[id number]]; and Shellfish-derived products   Review of Systems Review of Systems  Unable to perform ROS: Severe respiratory distress  Respiratory: Positive for cough and shortness of  breath.        Ventilator dependent  Cardiovascular: Positive for chest pain.       Chest pain with cough  Gastrointestinal: Negative.   Musculoskeletal: Negative.   Skin: Negative.   Allergic/Immunologic: Positive for immunocompromised state.       Diabetic  Neurological: Negative.   Psychiatric/Behavioral: Negative.      Physical Exam Updated Vital Signs BP 121/83 (BP Location: Left Arm)   Pulse (!) 127   Temp (!) 97.2 F (36.2 C) (Temporal)   Resp 14   SpO2 100%   Physical Exam  Constitutional: She appears well-developed and well-nourished.  HENT:  Head: Normocephalic and atraumatic.  Eyes: Pupils are equal, round, and reactive to light. Conjunctivae are normal.  Neck: Neck supple. No tracheal deviation present. No thyromegaly present.  Cardiovascular: Regular rhythm.  No murmur heard. tachycardic  Pulmonary/Chest: She is in respiratory distress. She has rales.  Rales  left side posteriorly.  Moderate respiratory distress  Abdominal: Soft. Bowel sounds are normal. She exhibits no distension. There is no tenderness.   Musculoskeletal: Normal range of motion. She exhibits no edema or tenderness.  Neurological: She is alert. Coordination normal.  Skin: Skin is warm and dry. No rash noted.  Psychiatric: She has a normal mood and affect.  Nursing note and vitals reviewed.    ED Treatments / Results  Labs (all labs ordered are listed, but only abnormal results are displayed) Labs Reviewed  CULTURE, BLOOD (ROUTINE X 2)  CULTURE, BLOOD (ROUTINE X 2)  COMPREHENSIVE METABOLIC PANEL  CBC WITH DIFFERENTIAL/PLATELET  BLOOD GAS, ARTERIAL  I-STAT CG4 LACTIC ACID, ED    EKG EKG Interpretation  Date/Time:  Sunday February 22 2018 14:31:27 EDT Ventricular Rate:  136 PR Interval:    QRS Duration: 69 QT Interval:  276 QTC Calculation: 416 R Axis:   88 Text Interpretation:  Sinus tachycardia Consider right atrial enlargement No significant change since last tracing Confirmed by Orlie Dakin [id number]) on 02/14/2018 3:35:00 PM   Radiology Dg Chest Portable 1 View  Result Date: 02/08/2018 CLINICAL DATA:  Pt states having trouble breathing for 2 days. Hx of emphysema, copd, asthma, and diabetes EXAM: PORTABLE CHEST 1 VIEW COMPARISON:  11/05/2017 FINDINGS: Tracheostomy tube is in place, tip unchanged approximately 6.7 centimeters above the carina. The balloon of the tracheostomy appears overinflated. RIGHT-sided PICC line tip overlies the superior vena cava. Lungs are hyperinflated. There is significant perihilar peribronchial thickening. Patchy ground-glass opacities are identified in the UPPER lobes bilaterally. There is LEFT LOWER lobe atelectasis or scarring. Trace bilateral pleural effusions. No pulmonary edema. IMPRESSION: 1. Hyperinflation and bronchitic changes. 2. Multifocal pneumonia. 3. Tracheostomy tube balloon appears overinflated relative to the diameter of the trachea. Electronically Signed   By: Nolon Nations M.D.   On: 02/04/2018 14:52    Procedures Procedures (including critical care  time)  Medications Ordered in ED Medications  ipratropium-albuterol (DUONEB) 0.5-2.5 (3) MG/3ML nebulizer solution 3 mL (3 mLs Nebulization Given 02/23/2018 1451)  acetaminophen (TYLENOL) tablet 650 mg (has no administration in time range)  sodium chloride 0.9 % bolus 1,000 mL (has no administration in time range)  vancomycin (VANCOCIN) IVPB 1000 mg/200 mL premix (has no administration in time range)  ceFEPIme (MAXIPIME) 2 g in sodium chloride 0.9 % 100 mL IVPB (has no administration in time range)    Results for orders placed or performed during the hospital encounter of 10/26/17  Culture, respiratory  Result Value Ref Range   Specimen Description TRACHEAL ASPIRATE  Special Requests NONE    Gram Stain      RARE WBC PRESENT,BOTH PMN AND MONONUCLEAR NO SQUAMOUS EPITHELIAL CELLS SEEN RARE GRAM POSITIVE COCCI IN PAIRS RARE GRAM POSITIVE RODS Performed at [facility], [place] [address], [place], [place] [address]    Culture MODERATE PSEUDOMONAS AERUGINOSA    Report Status 10/28/2017 FINAL    Organism ID, Bacteria PSEUDOMONAS AERUGINOSA       Susceptibility   Pseudomonas aeruginosa - MIC*    CEFTAZIDIME 16 INTERMEDIATE Intermediate     CIPROFLOXACIN >=4 RESISTANT Resistant     GENTAMICIN <=1 SENSITIVE Sensitive     IMIPENEM 2 SENSITIVE Sensitive     CEFEPIME INTERMEDIATE Intermediate     * MODERATE PSEUDOMONAS AERUGINOSA  Blood culture (routine x 2)  Result Value Ref Range   Specimen Description BLOOD RIGHT ARM    Special Requests      BOTTLES DRAWN AEROBIC AND ANAEROBIC Blood Culture adequate volume   Culture      NO GROWTH 5 DAYS Performed at [facility], [place] [address] [place], [place] [address]    Report Status 10/31/2017 FINAL   MRSA PCR Screening  Result Value Ref Range   MRSA by PCR NEGATIVE NEGATIVE  Culture, respiratory (NON-Expectorated)  Result Value Ref Range   Specimen Description TRACHEAL ASPIRATE    Special Requests NONE    Gram Stain       ABUNDANT WBC PRESENT, PREDOMINANTLY PMN RARE GRAM POSITIVE COCCI RARE GRAM VARIABLE ROD    Culture      FEW CORYNEBACTERIUM STRIATUM Standardized susceptibility testing for this organism is not available. Performed at [facility], [place] [address] [place], [place] [address]    Report Status 11/09/2017 FINAL   CBC  Result Value Ref Range   WBC 40.8 (H) 4.0 - 10.5 K/uL   RBC 4.65 3.87 - 5.11 MIL/uL   Hemoglobin 11.2 (L) 12.0 - 15.0 g/dL   HCT 40.4 36.0 - 46.0 %   MCV 86.9 78.0 - 100.0 fL   MCH 24.1 (L) 26.0 - 34.0 pg   MCHC 27.7 (L) 30.0 - 36.0 g/dL   RDW 17.1 (H) 11.5 - 15.5 %   Platelets 495 (H) 150 - 400 K/uL  Comprehensive metabolic panel  Result Value Ref Range   Sodium 142 135 - 145 mmol/L   Potassium 4.2 3.5 - 5.1 mmol/L   Chloride 96 (L) 98 - 111 mmol/L   CO2 32 22 - 32 mmol/L   Glucose, Bld 163 (H) 70 - 99 mg/dL   BUN 31 (H) 6 - 20 mg/dL   Creatinine, Ser 1.02 (H) 0.44 - 1.00 mg/dL   Calcium 9.8 8.9 - 10.3 mg/dL   Total Protein 5.9 (L) 6.5 - 8.1 g/dL   Albumin 3.0 (L) 3.5 - 5.0 g/dL   AST 54 (H) 15 - 41 U/L   ALT 61 (H) 0 - 44 U/L   Alkaline Phosphatase 92 38 - 126 U/L   Total Bilirubin 1.0 0.3 - 1.2 mg/dL   GFR calc non Af Amer >60 >60 mL/min   GFR calc Af Amer >60 >60 mL/min   Anion gap 14 5 - 15  Troponin I  Result Value Ref Range   Troponin I 0.06 (HH) <0.03 ng/mL  Brain natriuretic peptide  Result Value Ref Range   B Natriuretic Peptide 231.1 (H) 0.0 - 100.0 pg/mL  Urinalysis, Routine w reflex microscopic  Result Value Ref Range   Color, Urine AMBER (A)  YELLOW   APPearance CLOUDY (A) CLEAR   Specific Gravity, Urine 1.028 1.005 - 1.030   pH 6.0 5.0 - 8.0   Glucose, UA NEGATIVE NEGATIVE mg/dL   Hgb urine dipstick NEGATIVE NEGATIVE   Bilirubin Urine NEGATIVE NEGATIVE   Ketones, ur 5 (A) NEGATIVE mg/dL   Protein, ur 30 (A) NEGATIVE mg/dL   Nitrite NEGATIVE NEGATIVE   Leukocytes, UA NEGATIVE NEGATIVE   RBC / HPF 6-10 0 - 5 RBC/hpf   WBC, UA  21-50 0 - 5 WBC/hpf   Bacteria, UA RARE (A) NONE SEEN   Squamous Epithelial / LPF 0-5 0 - 5   WBC Clumps PRESENT    Mucus PRESENT    Budding Yeast PRESENT   Lactic acid, plasma  Result Value Ref Range   Lactic Acid, Venous 2.5 (HH) 0.5 - 1.9 mmol/L  CBC  Result Value Ref Range   WBC 21.7 (H) 4.0 - 10.5 K/uL   RBC 3.96 3.87 - 5.11 MIL/uL   Hemoglobin 9.4 (L) 12.0 - 15.0 g/dL   HCT 33.0 (L) 36.0 - 46.0 %   MCV 83.3 78.0 - 100.0 fL   MCH 23.7 (L) 26.0 - 34.0 pg   MCHC 28.5 (L) 30.0 - 36.0 g/dL   RDW 16.8 (H) 11.5 - 15.5 %   Platelets 263 150 - 400 K/uL  Basic metabolic panel  Result Value Ref Range   Sodium 138 135 - 145 mmol/L   Potassium 3.8 3.5 - 5.1 mmol/L   Chloride 93 (L) 98 - 111 mmol/L   CO2 32 22 - 32 mmol/L   Glucose, Bld 185 (H) 70 - 99 mg/dL   BUN 28 (H) 6 - 20 mg/dL   Creatinine, Ser 0.74 0.44 - 1.00 mg/dL   Calcium 9.2 8.9 - 10.3 mg/dL   GFR calc non Af Amer >60 >60 mL/min   GFR calc Af Amer >60 >60 mL/min   Anion gap 13 5 - 15  Magnesium  Result Value Ref Range   Magnesium 1.7 1.7 - 2.4 mg/dL  Phosphorus  Result Value Ref Range   Phosphorus 2.9 2.5 - 4.6 mg/dL  Glucose, capillary  Result Value Ref Range   Glucose-Capillary 197 (H) 70 - 99 mg/dL  Glucose, capillary  Result Value Ref Range   Glucose-Capillary 210 (H) 70 - 99 mg/dL  Glucose, capillary  Result Value Ref Range   Glucose-Capillary 163 (H) 70 - 99 mg/dL  Glucose, capillary  Result Value Ref Range   Glucose-Capillary 153 (H) 70 - 99 mg/dL  Glucose, capillary  Result Value Ref Range   Glucose-Capillary 139 (H) 70 - 99 mg/dL  Magnesium  Result Value Ref Range   Magnesium 1.8 1.7 - 2.4 mg/dL  Phosphorus  Result Value Ref Range   Phosphorus 3.0 2.5 - 4.6 mg/dL  Glucose, capillary  Result Value Ref Range   Glucose-Capillary 149 (H) 70 - 99 mg/dL  Basic metabolic panel  Result Value Ref Range   Sodium 135 135 - 145 mmol/L   Potassium 3.6 3.5 - 5.1 mmol/L   Chloride 94 (L) 98 - 111  mmol/L   CO2 32 22 - 32 mmol/L   Glucose, Bld 162 (H) 70 - 99 mg/dL   BUN 32 (H) 6 - 20 mg/dL   Creatinine, Ser 0.63 0.44 - 1.00 mg/dL   Calcium 8.9 8.9 - 10.3 mg/dL   GFR calc non Af Amer >60 >60 mL/min   GFR calc Af Amer >60 >60 mL/min   Anion gap 9  5 - 15  CBC  Result Value Ref Range   WBC 20.6 (H) 4.0 - 10.5 K/uL   RBC 3.53 (L) 3.87 - 5.11 MIL/uL   Hemoglobin 8.4 (L) 12.0 - 15.0 g/dL   HCT 28.9 (L) 36.0 - 46.0 %   MCV 81.9 78.0 - 100.0 fL   MCH 23.8 (L) 26.0 - 34.0 pg   MCHC 29.1 (L) 30.0 - 36.0 g/dL   RDW 17.0 (H) 11.5 - 15.5 %   Platelets 259 150 - 400 K/uL  Magnesium  Result Value Ref Range   Magnesium 1.8 1.7 - 2.4 mg/dL  Phosphorus  Result Value Ref Range   Phosphorus 2.9 2.5 - 4.6 mg/dL  Glucose, capillary  Result Value Ref Range   Glucose-Capillary 153 (H) 70 - 99 mg/dL  Glucose, capillary  Result Value Ref Range   Glucose-Capillary 157 (H) 70 - 99 mg/dL  Glucose, capillary  Result Value Ref Range   Glucose-Capillary 197 (H) 70 - 99 mg/dL  Glucose, capillary  Result Value Ref Range   Glucose-Capillary 178 (H) 70 - 99 mg/dL  Glucose, capillary  Result Value Ref Range   Glucose-Capillary 161 (H) 70 - 99 mg/dL   Comment 1 Notify RN   Glucose, capillary  Result Value Ref Range   Glucose-Capillary 170 (H) 70 - 99 mg/dL   Comment 1 Notify RN   Comprehensive metabolic panel  Result Value Ref Range   Sodium 136 135 - 145 mmol/L   Potassium 3.2 (L) 3.5 - 5.1 mmol/L   Chloride 93 (L) 98 - 111 mmol/L   CO2 32 22 - 32 mmol/L   Glucose, Bld 152 (H) 70 - 99 mg/dL   BUN 26 (H) 6 - 20 mg/dL   Creatinine, Ser 0.56 0.44 - 1.00 mg/dL   Calcium 8.9 8.9 - 10.3 mg/dL   Total Protein 4.9 (L) 6.5 - 8.1 g/dL   Albumin 2.1 (L) 3.5 - 5.0 g/dL   AST 34 15 - 41 U/L   ALT 46 (H) 0 - 44 U/L   Alkaline Phosphatase 80 38 - 126 U/L   Total Bilirubin 0.7 0.3 - 1.2 mg/dL   GFR calc non Af Amer >60 >60 mL/min   GFR calc Af Amer >60 >60 mL/min   Anion gap 11 5 - 15  CBC  Result  Value Ref Range   WBC 17.3 (H) 4.0 - 10.5 K/uL   RBC 3.73 (L) 3.87 - 5.11 MIL/uL   Hemoglobin 8.8 (L) 12.0 - 15.0 g/dL   HCT 30.0 (L) 36.0 - 46.0 %   MCV 80.4 78.0 - 100.0 fL   MCH 23.6 (L) 26.0 - 34.0 pg   MCHC 29.3 (L) 30.0 - 36.0 g/dL   RDW 17.0 (H) 11.5 - 15.5 %   Platelets 276 150 - 400 K/uL  Glucose, capillary  Result Value Ref Range   Glucose-Capillary 147 (H) 70 - 99 mg/dL  Glucose, capillary  Result Value Ref Range   Glucose-Capillary 149 (H) 70 - 99 mg/dL  Glucose, capillary  Result Value Ref Range   Glucose-Capillary 186 (H) 70 - 99 mg/dL  Glucose, capillary  Result Value Ref Range   Glucose-Capillary 121 (H) 70 - 99 mg/dL  Glucose, capillary  Result Value Ref Range   Glucose-Capillary 122 (H) 70 - 99 mg/dL  Glucose, capillary  Result Value Ref Range   Glucose-Capillary 116 (H) 70 - 99 mg/dL  Glucose, capillary  Result Value Ref Range   Glucose-Capillary 117 (H) 70 - 99  mg/dL  Glucose, capillary  Result Value Ref Range   Glucose-Capillary 123 (H) 70 - 99 mg/dL  Glucose, capillary  Result Value Ref Range   Glucose-Capillary 126 (H) 70 - 99 mg/dL  Glucose, capillary  Result Value Ref Range   Glucose-Capillary 152 (H) 70 - 99 mg/dL  Basic metabolic panel  Result Value Ref Range   Sodium 137 135 - 145 mmol/L   Potassium 3.6 3.5 - 5.1 mmol/L   Chloride 95 (L) 98 - 111 mmol/L   CO2 32 22 - 32 mmol/L   Glucose, Bld 175 (H) 70 - 99 mg/dL   BUN 18 6 - 20 mg/dL   Creatinine, Ser 0.44 0.44 - 1.00 mg/dL   Calcium 8.3 (L) 8.9 - 10.3 mg/dL   GFR calc non Af Amer >60 >60 mL/min   GFR calc Af Amer >60 >60 mL/min   Anion gap 10 5 - 15  Magnesium  Result Value Ref Range   Magnesium 1.7 1.7 - 2.4 mg/dL  CBC  Result Value Ref Range   WBC 19.3 (H) 4.0 - 10.5 K/uL   RBC 3.90 3.87 - 5.11 MIL/uL   Hemoglobin 9.2 (L) 12.0 - 15.0 g/dL   HCT 32.4 (L) 36.0 - 46.0 %   MCV 83.1 78.0 - 100.0 fL   MCH 23.6 (L) 26.0 - 34.0 pg   MCHC 28.4 (L) 30.0 - 36.0 g/dL   RDW 17.4 (H)  11.5 - 15.5 %   Platelets 244 150 - 400 K/uL  Glucose, capillary  Result Value Ref Range   Glucose-Capillary 176 (H) 70 - 99 mg/dL  Glucose, capillary  Result Value Ref Range   Glucose-Capillary 195 (H) 70 - 99 mg/dL  CBC  Result Value Ref Range   WBC 19.3 (H) 4.0 - 10.5 K/uL   RBC 3.78 (L) 3.87 - 5.11 MIL/uL   Hemoglobin 8.8 (L) 12.0 - 15.0 g/dL   HCT 31.4 (L) 36.0 - 46.0 %   MCV 83.1 78.0 - 100.0 fL   MCH 23.3 (L) 26.0 - 34.0 pg   MCHC 28.0 (L) 30.0 - 36.0 g/dL   RDW 17.1 (H) 11.5 - 15.5 %   Platelets 226 150 - 400 K/uL  Basic metabolic panel  Result Value Ref Range   Sodium 137 135 - 145 mmol/L   Potassium 4.0 3.5 - 5.1 mmol/L   Chloride 95 (L) 98 - 111 mmol/L   CO2 32 22 - 32 mmol/L   Glucose, Bld 126 (H) 70 - 99 mg/dL   BUN 16 6 - 20 mg/dL   Creatinine, Ser 0.37 (L) 0.44 - 1.00 mg/dL   Calcium 8.5 (L) 8.9 - 10.3 mg/dL   GFR calc non Af Amer >60 >60 mL/min   GFR calc Af Amer >60 >60 mL/min   Anion gap 10 5 - 15  Magnesium  Result Value Ref Range   Magnesium 1.8 1.7 - 2.4 mg/dL  Phosphorus  Result Value Ref Range   Phosphorus 3.1 2.5 - 4.6 mg/dL  Glucose, capillary  Result Value Ref Range   Glucose-Capillary 174 (H) 70 - 99 mg/dL  Glucose, capillary  Result Value Ref Range   Glucose-Capillary 148 (H) 70 - 99 mg/dL  Glucose, capillary  Result Value Ref Range   Glucose-Capillary 115 (H) 70 - 99 mg/dL  Glucose, capillary  Result Value Ref Range   Glucose-Capillary 138 (H) 70 - 99 mg/dL  Glucose, capillary  Result Value Ref Range   Glucose-Capillary 180 (H) 70 - 99 mg/dL  Glucose, capillary  Result Value Ref Range   Glucose-Capillary 165 (H) 70 - 99 mg/dL  Glucose, capillary  Result Value Ref Range   Glucose-Capillary 134 (H) 70 - 99 mg/dL  Glucose, capillary  Result Value Ref Range   Glucose-Capillary 102 (H) 70 - 99 mg/dL  Glucose, capillary  Result Value Ref Range   Glucose-Capillary 101 (H) 70 - 99 mg/dL  Glucose, capillary  Result Value Ref Range    Glucose-Capillary 119 (H) 70 - 99 mg/dL  Glucose, capillary  Result Value Ref Range   Glucose-Capillary 180 (H) 70 - 99 mg/dL  Glucose, capillary  Result Value Ref Range   Glucose-Capillary 157 (H) 70 - 99 mg/dL  CBC  Result Value Ref Range   WBC 12.5 (H) 4.0 - 10.5 K/uL   RBC 3.33 (L) 3.87 - 5.11 MIL/uL   Hemoglobin 7.9 (L) 12.0 - 15.0 g/dL   HCT 27.8 (L) 36.0 - 46.0 %   MCV 83.5 78.0 - 100.0 fL   MCH 23.7 (L) 26.0 - 34.0 pg   MCHC 28.4 (L) 30.0 - 36.0 g/dL   RDW 17.6 (H) 11.5 - 15.5 %   Platelets 231 150 - 400 K/uL  Comprehensive metabolic panel  Result Value Ref Range   Sodium 139 135 - 145 mmol/L   Potassium 4.0 3.5 - 5.1 mmol/L   Chloride 101 98 - 111 mmol/L   CO2 32 22 - 32 mmol/L   Glucose, Bld 81 70 - 99 mg/dL   BUN 12 6 - 20 mg/dL   Creatinine, Ser 0.34 (L) 0.44 - 1.00 mg/dL   Calcium 8.3 (L) 8.9 - 10.3 mg/dL   Total Protein 5.0 (L) 6.5 - 8.1 g/dL   Albumin 2.0 (L) 3.5 - 5.0 g/dL   AST 30 15 - 41 U/L   ALT 34 0 - 44 U/L   Alkaline Phosphatase 86 38 - 126 U/L   Total Bilirubin 0.7 0.3 - 1.2 mg/dL   GFR calc non Af Amer >60 >60 mL/min   GFR calc Af Amer >60 >60 mL/min   Anion gap 6 5 - 15  Glucose, capillary  Result Value Ref Range   Glucose-Capillary 126 (H) 70 - 99 mg/dL  Glucose, capillary  Result Value Ref Range   Glucose-Capillary 90 70 - 99 mg/dL  Glucose, capillary  Result Value Ref Range   Glucose-Capillary 91 70 - 99 mg/dL  Glucose, capillary  Result Value Ref Range   Glucose-Capillary 81 70 - 99 mg/dL  Glucose, capillary  Result Value Ref Range   Glucose-Capillary 89 70 - 99 mg/dL  Glucose, capillary  Result Value Ref Range   Glucose-Capillary 87 70 - 99 mg/dL  Basic metabolic panel  Result Value Ref Range   Sodium 142 135 - 145 mmol/L   Potassium 3.8 3.5 - 5.1 mmol/L   Chloride 98 98 - 111 mmol/L   CO2 38 (H) 22 - 32 mmol/L   Glucose, Bld 88 70 - 99 mg/dL   BUN 9 6 - 20 mg/dL   Creatinine, Ser 0.40 (L) 0.44 - 1.00 mg/dL   Calcium  8.7 (L) 8.9 - 10.3 mg/dL   GFR calc non Af Amer >60 >60 mL/min   GFR calc Af Amer >60 >60 mL/min   Anion gap 6 5 - 15  CBC  Result Value Ref Range   WBC 22.6 (H) 4.0 - 10.5 K/uL   RBC 4.02 3.87 - 5.11 MIL/uL   Hemoglobin 9.4 (L) 12.0 - 15.0 g/dL  HCT 32.9 (L) 36.0 - 46.0 %   MCV 81.8 78.0 - 100.0 fL   MCH 23.4 (L) 26.0 - 34.0 pg   MCHC 28.6 (L) 30.0 - 36.0 g/dL   RDW 17.6 (H) 11.5 - 15.5 %   Platelets 303 150 - 400 K/uL  Glucose, capillary  Result Value Ref Range   Glucose-Capillary 86 70 - 99 mg/dL  Glucose, capillary  Result Value Ref Range   Glucose-Capillary 83 70 - 99 mg/dL  Glucose, capillary  Result Value Ref Range   Glucose-Capillary 95 70 - 99 mg/dL  Blood gas, arterial  Result Value Ref Range   FIO2 40.00    Delivery systems VENTILATOR    Mode PRESSURE CONTROL    LHR 24 resp/min   Peep/cpap 8.0 cm H20   Pressure control 30 cm H20   pH, Arterial 7.360 7.350 - 7.450   pCO2 arterial 63.8 (H) 32.0 - 48.0 mmHg   pO2, Arterial 68.7 (L) 83.0 - 108.0 mmHg   Bicarbonate 35.2 (H) 20.0 - 28.0 mmol/L   Acid-Base Excess 9.6 (H) 0.0 - 2.0 mmol/L   O2 Saturation 91.8 %   Patient temperature 98.2    Collection site LEFT RADIAL    Drawn by [id number]    Sample type ARTERIAL DRAW    Allens test (pass/fail) PASS PASS  Glucose, capillary  Result Value Ref Range   Glucose-Capillary 92 70 - 99 mg/dL  Glucose, capillary  Result Value Ref Range   Glucose-Capillary 92 70 - 99 mg/dL  Glucose, capillary  Result Value Ref Range   Glucose-Capillary 107 (H) 70 - 99 mg/dL  CBC  Result Value Ref Range   WBC 15.2 (H) 4.0 - 10.5 K/uL   RBC 3.45 (L) 3.87 - 5.11 MIL/uL   Hemoglobin 8.0 (L) 12.0 - 15.0 g/dL   HCT 27.9 (L) 36.0 - 46.0 %   MCV 80.9 78.0 - 100.0 fL   MCH 23.2 (L) 26.0 - 34.0 pg   MCHC 28.7 (L) 30.0 - 36.0 g/dL   RDW 17.2 (H) 11.5 - 15.5 %   Platelets 261 150 - 400 K/uL  Basic metabolic panel  Result Value Ref Range   Sodium 141 135 - 145 mmol/L   Potassium 3.6 3.5 -  5.1 mmol/L   Chloride 95 (L) 98 - 111 mmol/L   CO2 41 (H) 22 - 32 mmol/L   Glucose, Bld 84 70 - 99 mg/dL   BUN 9 6 - 20 mg/dL   Creatinine, Ser 0.56 0.44 - 1.00 mg/dL   Calcium 8.6 (L) 8.9 - 10.3 mg/dL   GFR calc non Af Amer >60 >60 mL/min   GFR calc Af Amer >60 >60 mL/min   Anion gap 5 5 - 15  Glucose, capillary  Result Value Ref Range   Glucose-Capillary 90 70 - 99 mg/dL  Glucose, capillary  Result Value Ref Range   Glucose-Capillary 89 70 - 99 mg/dL  Glucose, capillary  Result Value Ref Range   Glucose-Capillary 77 70 - 99 mg/dL  Glucose, capillary  Result Value Ref Range   Glucose-Capillary 76 70 - 99 mg/dL  Glucose, capillary  Result Value Ref Range   Glucose-Capillary 92 70 - 99 mg/dL  Glucose, capillary  Result Value Ref Range   Glucose-Capillary 97 70 - 99 mg/dL  Glucose, capillary  Result Value Ref Range   Glucose-Capillary 91 70 - 99 mg/dL  Glucose, capillary  Result Value Ref Range   Glucose-Capillary 78 70 - 99 mg/dL  Glucose, capillary  Result Value Ref Range   Glucose-Capillary 62 (L) 70 - 99 mg/dL  Glucose, capillary  Result Value Ref Range   Glucose-Capillary 76 70 - 99 mg/dL  Glucose, capillary  Result Value Ref Range   Glucose-Capillary 67 (L) 70 - 99 mg/dL  Glucose, capillary  Result Value Ref Range   Glucose-Capillary 114 (H) 70 - 99 mg/dL  Glucose, capillary  Result Value Ref Range   Glucose-Capillary 82 70 - 99 mg/dL  Glucose, capillary  Result Value Ref Range   Glucose-Capillary 109 (H) 70 - 99 mg/dL  Glucose, capillary  Result Value Ref Range   Glucose-Capillary 101 (H) 70 - 99 mg/dL  Glucose, capillary  Result Value Ref Range   Glucose-Capillary 99 70 - 99 mg/dL  Glucose, capillary  Result Value Ref Range   Glucose-Capillary 91 70 - 99 mg/dL  Glucose, capillary  Result Value Ref Range   Glucose-Capillary 120 (H) 70 - 99 mg/dL  Glucose, capillary  Result Value Ref Range   Glucose-Capillary 161 (H) 70 - 99 mg/dL  I-Stat  arterial blood gas, ED  ([facility], [facility])  Result Value Ref Range   pH, Arterial 7.219 (L) 7.350 - 7.450   pCO2 arterial >91.0 (HH) 32.0 - 48.0 mmHg   pO2, Arterial 61.0 (L) 83.0 - 108.0 mmHg   Bicarbonate 37.7 (H) 20.0 - 28.0 mmol/L   TCO2 40 (H) 22 - 32 mmol/L   O2 Saturation 80.0 %   Acid-Base Excess 7.0 (H) 0.0 - 2.0 mmol/L   Patient temperature 100.8 F    Collection site RADIAL, ALLEN'S TEST ACCEPTABLE    Drawn by Operator    Sample type ARTERIAL    Comment NOTIFIED PHYSICIAN   I-Stat CG4 Lactic Acid, ED  Result Value Ref Range   Lactic Acid, Venous 2.39 (HH) 0.5 - 1.9 mmol/L   Comment NOTIFIED PHYSICIAN   I-STAT 3, arterial blood gas (G3+)  Result Value Ref Range   pH, Arterial 7.490 (H) 7.350 - 7.450   pCO2 arterial 43.0 32.0 - 48.0 mmHg   pO2, Arterial 72.0 (L) 83.0 - 108.0 mmHg   Bicarbonate 32.6 (H) 20.0 - 28.0 mmol/L   TCO2 34 (H) 22 - 32 mmol/L   O2 Saturation 95.0 %   Acid-Base Excess 9.0 (H) 0.0 - 2.0 mmol/L   Patient temperature 99.9 F    Collection site RADIAL, ALLEN'S TEST ACCEPTABLE    Drawn by RT    Sample type ARTERIAL   I-STAT 3, arterial blood gas (G3+)  Result Value Ref Range   pH, Arterial 7.456 (H) 7.350 - 7.450   pCO2 arterial 51.3 (H) 32.0 - 48.0 mmHg   pO2, Arterial 82.0 (L) 83.0 - 108.0 mmHg   Bicarbonate 36.1 (H) 20.0 - 28.0 mmol/L   TCO2 38 (H) 22 - 32 mmol/L   O2 Saturation 96.0 %   Acid-Base Excess 11.0 (H) 0.0 - 2.0 mmol/L   Patient temperature 98.6 F    Collection site RADIAL, ALLEN'S TEST ACCEPTABLE    Sample type ARTERIAL    Dg Chest Portable 1 View  Result Date: 01/29/2018 CLINICAL DATA:  Pt states having trouble breathing for 2 days. Hx of emphysema, copd, asthma, and diabetes EXAM: PORTABLE CHEST 1 VIEW COMPARISON:  11/05/2017 FINDINGS: Tracheostomy tube is in place, tip unchanged approximately 6.7 centimeters above the carina. The balloon of the tracheostomy appears overinflated. RIGHT-sided PICC line tip overlies the superior vena cava.  Lungs are hyperinflated. There is significant perihilar peribronchial thickening. Patchy ground-glass opacities are identified  in the UPPER lobes bilaterally. There is LEFT LOWER lobe atelectasis or scarring. Trace bilateral pleural effusions. No pulmonary edema. IMPRESSION: 1. Hyperinflation and bronchitic changes. 2. Multifocal pneumonia. 3. Tracheostomy tube balloon appears overinflated relative to the diameter of the trachea. Electronically Signed   By: Nolon Nations M.D.   On: 02/06/2018 14:52   Initial Impression / Assessment and Plan / ED Course  I have reviewed the triage vital signs and the nursing notes.  Pertinent labs & imaging results that were available during my care of the patient were reviewed by me and considered in my medical decision making (see chart for details).    Chest x-ray reviewed by me. 3:25 PM patient feels breathing somewhat improved after treatment with DuoNeb.  Tylenol ordered for chest pain. IV antibiotics ordered. Pt signed out to Dr. Darl Householder 330 pm Final Clinical Impressions(s) / ED Diagnoses  Dx ventilator acquired pneumonia Final diagnoses:  None   CRITICAL CARE Performed by: Orlie Dakin Total critical care time: 30 minutes Critical care time was exclusive of separately billable procedures and treating other patients. Critical care was necessary to treat or prevent imminent or life-threatening deterioration. Critical care was time spent personally by me on the following activities: development of treatment plan with patient and/or surrogate as well as nursing, discussions with consultants, evaluation of patient's response to treatment, examination of patient, obtaining history from patient or surrogate, ordering and performing treatments and interventions, ordering and review of laboratory studies, ordering and review of radiographic studies, pulse oximetry and re-evaluation of patient's condition. ED Discharge Orders    None       Orlie Dakin,  MD 02/20/2018 [place], MD 02/19/2018 [id number]    Orlie Dakin, MD 02/18/2018 [phone]

## 2018-02-22 NOTE — ED Notes (Signed)
Per RN from [facility], pt sent to ED for elevated HR and labored breathing.

## 2018-02-22 NOTE — Progress Notes (Signed)
Pharmacy Antibiotic Note  Danielle Stevens is a 47 y.o. female admitted on Feb 23, 2018 with pneumonia.  Pharmacy has been consulted for cefepime dosing. SCr wnl. WBC 32.   Plan: -Cefepime 2 gm IV Q 12 hours -Monitor CBC, renal fx, cultures and clinical progress -VT at SS      Temp (24hrs), Avg:97.2 F (36.2 C), Min:97.2 F (36.2 C), Max:97.2 F (36.2 C)  No results for input(s): WBC, CREATININE, LATICACIDVEN, VANCOTROUGH, VANCOPEAK, VANCORANDOM, GENTTROUGH, GENTPEAK, GENTRANDOM, TOBRATROUGH, TOBRAPEAK, TOBRARND, AMIKACINPEAK, AMIKACINTROU, AMIKACIN in the last 168 hours.  CrCl cannot be calculated (Patient's most recent lab result is older than the maximum 21 days allowed.).    Allergies  Allergen Reactions  . Wasp Venom Protein Shortness Of Breath  . Adhesive [Tape] Other (See Comments)    Bruises   . Coconut Oil Hives  . Latex Other (See Comments)    Bruises   . Robitussin Severe Multi-Symp [Phenylephrine-Dm-Gg-Apap] Diarrhea and Other (See Comments)    Allergic, per verbal MAR  . Shellfish-Derived Products Hives and Other (See Comments)    Allergic, per verbal MAR    Thank you for allowing pharmacy to be a part of this patient's care.  Vinnie Level, PharmD., BCPS Clinical Pharmacist Clinical phone for 01/2718 until 10pm: [phone]

## 2018-02-23 ENCOUNTER — Inpatient Hospital Stay (HOSPITAL_COMMUNITY): Payer: Medicare Other

## 2018-02-23 DIAGNOSIS — J9601 Acute respiratory failure with hypoxia: Secondary | ICD-10-CM | POA: Diagnosis not present

## 2018-02-23 DIAGNOSIS — J151 Pneumonia due to Pseudomonas: Secondary | ICD-10-CM | POA: Diagnosis not present

## 2018-02-23 DIAGNOSIS — J9621 Acute and chronic respiratory failure with hypoxia: Secondary | ICD-10-CM | POA: Diagnosis not present

## 2018-02-23 DIAGNOSIS — J939 Pneumothorax, unspecified: Secondary | ICD-10-CM

## 2018-02-23 DIAGNOSIS — R0603 Acute respiratory distress: Secondary | ICD-10-CM | POA: Diagnosis not present

## 2018-02-23 DIAGNOSIS — J9622 Acute and chronic respiratory failure with hypercapnia: Secondary | ICD-10-CM | POA: Diagnosis not present

## 2018-02-23 DIAGNOSIS — A419 Sepsis, unspecified organism: Secondary | ICD-10-CM | POA: Diagnosis not present

## 2018-02-23 LAB — TRIGLYCERIDES: Triglycerides: 105 mg/dL (ref <150)

## 2018-02-23 LAB — GLUCOSE, CAPILLARY
GLUCOSE-CAPILLARY: 146 mg/dL — AB (ref 70–99)
GLUCOSE-CAPILLARY: 146 mg/dL — AB (ref 70–99)
GLUCOSE-CAPILLARY: 250 mg/dL — AB (ref 70–99)
Glucose-Capillary: 130 mg/dL — ABNORMAL HIGH (ref 70–99)
Glucose-Capillary: 159 mg/dL — ABNORMAL HIGH (ref 70–99)
Glucose-Capillary: 164 mg/dL — ABNORMAL HIGH (ref 70–99)
Glucose-Capillary: 204 mg/dL — ABNORMAL HIGH (ref 70–99)
Glucose-Capillary: 260 mg/dL — ABNORMAL HIGH (ref 70–99)

## 2018-02-23 LAB — POCT I-STAT 3, ART BLOOD GAS (G3+)
Acid-base deficit: 1 mmol/L (ref 0.0–2.0)
Acid-base deficit: 5 mmol/L — ABNORMAL HIGH (ref 0.0–2.0)
Acid-base deficit: 5 mmol/L — ABNORMAL HIGH (ref 0.0–2.0)
BICARBONATE: 27.8 mmol/L (ref 20.0–28.0)
BICARBONATE: 26.5 mmol/L (ref 20.0–28.0)
Bicarbonate: 23.7 mmol/L (ref 20.0–28.0)
Bicarbonate: 27.1 mmol/L (ref 20.0–28.0)
Bicarbonate: 28.3 mmol/L — ABNORMAL HIGH (ref 20.0–28.0)
O2 SAT: 91 %
O2 SAT: 99 %
O2 Saturation: 100 %
O2 Saturation: 86 %
O2 Saturation: 96 %
PCO2 ART: 57.2 mmHg — AB (ref 32.0–48.0)
PCO2 ART: 70.7 mmHg — AB (ref 32.0–48.0)
PCO2 ART: 89 mmHg — AB (ref 32.0–48.0)
PH ART: 7.202 — AB (ref 7.350–7.450)
PH ART: 7.215 — AB (ref 7.350–7.450)
Patient temperature: 99.8
Patient temperature: 100.2
Patient temperature: 97.6
TCO2: 26 mmol/L (ref 22–32)
TCO2: 29 mmol/L (ref 22–32)
TCO2: 29 mmol/L (ref 22–32)
TCO2: 29 mmol/L (ref 22–32)
TCO2: 30 mmol/L (ref 22–32)
pCO2 arterial: 55.4 mmHg — ABNORMAL HIGH (ref 32.0–48.0)
pCO2 arterial: 60.3 mmHg — ABNORMAL HIGH (ref 32.0–48.0)
pH, Arterial: 7.286 — ABNORMAL LOW (ref 7.350–7.450)
pH, Arterial: 7.305 — ABNORMAL LOW (ref 7.350–7.450)
pH, Arterial: 7.083 — CL (ref 7.350–7.450)
pO2, Arterial: 100 mmHg (ref 83.0–108.0)
pO2, Arterial: 185 mmHg — ABNORMAL HIGH (ref 83.0–108.0)
pO2, Arterial: 313 mmHg — ABNORMAL HIGH (ref 83.0–108.0)
pO2, Arterial: 73 mmHg — ABNORMAL LOW (ref 83.0–108.0)
pO2, Arterial: 75 mmHg — ABNORMAL LOW (ref 83.0–108.0)

## 2018-02-23 LAB — PROCALCITONIN: Procalcitonin: 33.75 ng/mL

## 2018-02-23 LAB — COMPREHENSIVE METABOLIC PANEL
ALT: 63 U/L — AB (ref 0–44)
ANION GAP: 6 (ref 5–15)
AST: 39 U/L (ref 15–41)
Albumin: 2.6 g/dL — ABNORMAL LOW (ref 3.5–5.0)
Alkaline Phosphatase: 138 U/L — ABNORMAL HIGH (ref 38–126)
BUN: 18 mg/dL (ref 6–20)
CHLORIDE: 104 mmol/L (ref 98–111)
CO2: 27 mmol/L (ref 22–32)
CREATININE: 0.68 mg/dL (ref 0.44–1.00)
Calcium: 9.3 mg/dL (ref 8.9–10.3)
GFR calc non Af Amer: >60 mL/min (ref >60)
Glucose, Bld: 163 mg/dL — ABNORMAL HIGH (ref 70–99)
POTASSIUM: 4.3 mmol/L (ref 3.5–5.1)
SODIUM: 137 mmol/L (ref 135–145)
Total Bilirubin: 1 mg/dL (ref 0.3–1.2)
Total Protein: 6.2 g/dL — ABNORMAL LOW (ref 6.5–8.1)

## 2018-02-23 LAB — CBC WITH DIFFERENTIAL/PLATELET
BASOS ABS: 0.2 10*3/uL — AB (ref 0.0–0.1)
BASOS PCT: 1 %
Band Neutrophils: 26 %
EOS ABS: 0.7 10*3/uL — AB (ref 0.0–0.5)
Eosinophils Relative: 3 %
HCT: 39.9 % (ref 36.0–46.0)
Hemoglobin: 11.3 g/dL — ABNORMAL LOW (ref 12.0–15.0)
LYMPHS ABS: 1 10*3/uL (ref 0.7–4.0)
Lymphocytes Relative: 4 %
MCH: 22.8 pg — AB (ref 26.0–34.0)
MCHC: 28.3 g/dL — AB (ref 30.0–36.0)
MCV: 80.6 fL (ref 80.0–100.0)
Metamyelocytes Relative: 1 %
Monocytes Absolute: 1.7 10*3/uL — ABNORMAL HIGH (ref 0.1–1.0)
Monocytes Relative: 7 %
NEUTROS ABS: 21 10*3/uL — AB (ref 1.7–7.7)
NEUTROS PCT: 57 %
NRBC: 0.1 % (ref 0.0–0.2)
PLATELETS: 227 10*3/uL (ref 150–400)
PROMYELOCYTES RELATIVE: 1 %
RBC: 4.95 MIL/uL (ref 3.87–5.11)
RDW: 16.9 % — AB (ref 11.5–15.5)
WBC: 24.7 10*3/uL — AB (ref 4.0–10.5)
nRBC: 0 /100 WBC

## 2018-02-23 LAB — TROPONIN I: Troponin I: <0.03 ng/mL (ref <0.03)

## 2018-02-23 MED ORDER — ADULT MULTIVITAMIN LIQUID CH
15.0000 mL | Freq: Every day | ORAL | Status: DC
  Administered 2018-02-23 – 2018-03-01 (×7): 15 mL
  Filled 2018-02-23 (×7): qty 15

## 2018-02-23 MED ORDER — SODIUM CHLORIDE 0.9 % IV BOLUS: 1000.0000 mL | Freq: Once | INTRAVENOUS | Status: DC

## 2018-02-23 MED ORDER — SODIUM CHLORIDE 0.9 % IV SOLN
0.0000 mg/h | INTRAVENOUS | Status: DC
  Administered 2018-02-23: 5 mg/h via INTRAVENOUS
  Administered 2018-02-23: 1 mg/h via INTRAVENOUS
  Administered 2018-02-24: 5 mg/h via INTRAVENOUS
  Filled 2018-02-23 (×3): qty 10

## 2018-02-23 MED ORDER — FENTANYL CITRATE (PF) 100 MCG/2ML IJ SOLN
25.0000 ug | INTRAMUSCULAR | Status: DC | PRN
  Administered 2018-02-23: 100 ug via INTRAVENOUS
  Filled 2018-02-23: qty 2

## 2018-02-23 MED ORDER — QUETIAPINE FUMARATE 50 MG PO TABS
150.0000 mg | ORAL_TABLET | Freq: Two times a day (BID) | ORAL | Status: DC
  Administered 2018-02-23 – 2018-03-01 (×13): 150 mg
  Filled 2018-02-23 (×14): qty 1

## 2018-02-23 MED ORDER — FENTANYL CITRATE (PF) 100 MCG/2ML IJ SOLN
INTRAMUSCULAR | Status: AC
  Filled 2018-02-23: qty 2

## 2018-02-23 MED ORDER — PROPOFOL 1000 MG/100ML IV EMUL
0.0000 ug/kg/min | INTRAVENOUS | Status: DC
  Administered 2018-02-23 (×2): 50 ug/kg/min via INTRAVENOUS
  Filled 2018-02-23: qty 100

## 2018-02-23 MED ORDER — METHYLPREDNISOLONE SODIUM SUCC 125 MG IJ SOLR
40.0000 mg | Freq: Four times a day (QID) | INTRAMUSCULAR | Status: DC
  Administered 2018-02-23 – 2018-02-25 (×9): 40 mg via INTRAVENOUS
  Filled 2018-02-23 (×9): qty 2

## 2018-02-23 MED ORDER — FENTANYL 2500MCG IN NS 250ML (10MCG/ML) PREMIX INFUSION
25.0000 ug/h | INTRAVENOUS | Status: DC
  Administered 2018-02-23: 25 ug/h via INTRAVENOUS
  Administered 2018-02-24: 100 ug/h via INTRAVENOUS
  Filled 2018-02-23 (×2): qty 250

## 2018-02-23 MED ORDER — ROCURONIUM BROMIDE 50 MG/5ML IV SOLN
50.0000 mg | Freq: Once | INTRAVENOUS | Status: AC
  Administered 2018-02-23: 50 mg via INTRAVENOUS

## 2018-02-23 MED ORDER — HEPARIN (PORCINE) IN NACL 100-0.45 UNIT/ML-% IJ SOLN
1450.0000 [IU]/h | INTRAMUSCULAR | Status: DC
  Administered 2018-02-23: 1000 [IU]/h via INTRAVENOUS
  Administered 2018-02-24: 1350 [IU]/h via INTRAVENOUS
  Filled 2018-02-23 (×2): qty 250

## 2018-02-23 MED ORDER — MIDAZOLAM HCL 2 MG/2ML IJ SOLN
2.0000 mg | INTRAMUSCULAR | Status: DC | PRN
  Administered 2018-02-23 – 2018-02-26 (×7): 2 mg via INTRAVENOUS
  Filled 2018-02-23 (×5): qty 2

## 2018-02-23 MED ORDER — NOREPINEPHRINE 4 MG/250ML-% IV SOLN
0.0000 ug/min | INTRAVENOUS | Status: DC
  Administered 2018-02-23: 6 ug/min via INTRAVENOUS
  Administered 2018-02-23: 2 ug/min via INTRAVENOUS
  Filled 2018-02-23: qty 250

## 2018-02-23 MED ORDER — VITAL HIGH PROTEIN PO LIQD
1000.0000 mL | ORAL | Status: DC
  Administered 2018-02-23 – 2018-02-27 (×5): 1000 mL

## 2018-02-23 MED ORDER — PROPOFOL 1000 MG/100ML IV EMUL
INTRAVENOUS | Status: AC
  Administered 2018-02-23: 50 ug/kg/min via INTRAVENOUS
  Filled 2018-02-23: qty 100

## 2018-02-23 MED ORDER — MIDAZOLAM HCL 2 MG/2ML IJ SOLN
INTRAMUSCULAR | Status: AC
  Administered 2018-02-23: 2 mg via INTRAVENOUS
  Filled 2018-02-23: qty 2

## 2018-02-23 MED ORDER — LORAZEPAM 2 MG/ML IJ SOLN
INTRAMUSCULAR | Status: AC
  Filled 2018-02-23: qty 1

## 2018-02-23 MED ORDER — MIDAZOLAM HCL 2 MG/2ML IJ SOLN
2.0000 mg | INTRAMUSCULAR | Status: DC | PRN
  Administered 2018-02-23 – 2018-02-25 (×2): 2 mg via INTRAVENOUS
  Filled 2018-02-23 (×3): qty 2

## 2018-02-23 MED ORDER — ACETYLCYSTEINE 20 % IN SOLN
4.0000 mL | Freq: Four times a day (QID) | RESPIRATORY_TRACT | Status: DC
  Administered 2018-02-23 (×2): 4 mL via RESPIRATORY_TRACT
  Filled 2018-02-23 (×3): qty 4

## 2018-02-23 MED ORDER — FENTANYL CITRATE (PF) 100 MCG/2ML IJ SOLN
100.0000 ug | Freq: Once | INTRAMUSCULAR | Status: AC
  Administered 2018-02-23: 100 ug via INTRAVENOUS

## 2018-02-23 MED ORDER — QUETIAPINE FUMARATE 100 MG PO TABS
100.0000 mg | ORAL_TABLET | Freq: Once | ORAL | Status: AC
  Administered 2018-02-23: 100 mg via ORAL
  Filled 2018-02-23: qty 1

## 2018-02-23 MED ORDER — FENTANYL CITRATE (PF) 100 MCG/2ML IJ SOLN
50.0000 ug | INTRAMUSCULAR | Status: DC | PRN
  Administered 2018-02-23 – 2018-02-26 (×5): 100 ug via INTRAVENOUS
  Administered 2018-02-26: 50 ug via INTRAVENOUS
  Administered 2018-02-26 (×5): 100 ug via INTRAVENOUS
  Filled 2018-02-23 (×9): qty 2

## 2018-02-23 MED ORDER — DEXMEDETOMIDINE HCL IN NACL 400 MCG/100ML IV SOLN
0.4000 ug/kg/h | INTRAVENOUS | Status: DC
  Administered 2018-02-23: 0.4 ug/kg/h via INTRAVENOUS
  Filled 2018-02-23: qty 100

## 2018-02-23 MED ORDER — FENTANYL CITRATE (PF) 100 MCG/2ML IJ SOLN: 50.0000 ug | Freq: Once | INTRAMUSCULAR | Status: DC

## 2018-02-23 MED ORDER — SODIUM CHLORIDE 0.9 % IV BOLUS
500.0000 mL | Freq: Once | INTRAVENOUS | Status: AC
  Administered 2018-02-23: 500 mL via INTRAVENOUS

## 2018-02-23 MED ORDER — SODIUM CHLORIDE 0.9 % IV SOLN
1.0000 g | Freq: Three times a day (TID) | INTRAVENOUS | Status: DC
  Administered 2018-02-23 – 2018-02-25 (×8): 1 g via INTRAVENOUS
  Filled 2018-02-23 (×9): qty 1

## 2018-02-23 MED ORDER — CLONAZEPAM 1 MG PO TABS
1.0000 mg | ORAL_TABLET | Freq: Three times a day (TID) | ORAL | Status: DC | PRN
  Administered 2018-02-25 – 2018-02-26 (×3): 1 mg
  Filled 2018-02-23 (×3): qty 1

## 2018-02-23 MED ORDER — FENTANYL BOLUS VIA INFUSION
50.0000 ug | INTRAVENOUS | Status: DC | PRN
  Administered 2018-02-26 – 2018-02-27 (×4): 50 ug via INTRAVENOUS
  Filled 2018-02-23: qty 50

## 2018-02-23 MED ORDER — FENTANYL CITRATE (PF) 100 MCG/2ML IJ SOLN: 100.0000 ug | Freq: Once | INTRAMUSCULAR | Status: DC

## 2018-02-23 NOTE — Progress Notes (Addendum)
eLink Physician-Brief Progress Note Patient Name: Danielle Stevens DOB: 21-Nov-1970 MRN: [id number]   Date of Service  02/23/2018  HPI/Events of Note  Did not tolerate PRVC. Dropped sats into 84%.  eICU Interventions  Will order: 1. Place back on 50%/Pressure Control 30/Rate 18/P 8. 2. ABG at 5 AM.      Intervention Category Major Interventions: Respiratory failure - evaluation and management  [name],[name] 02/23/2018, 3:47 AM

## 2018-02-23 NOTE — Progress Notes (Signed)
Follow up - Critical Care Medicine Note  Patient Details:    Danielle Stevens is an 47 y.o. female admitted for acute on chronic respiratory failure due to RUL pneumonia on background of A1AT with COPD and OHS. Started on antibiotics.   Subjective:    Overnight Issues: increased respiratory distress this morning with increased WOB and oxygenation requirement.   Objective:   Physical Exam:  General: tachycardic, hypotensive. Chronically ill appearing.  HEENT: dry mucous membranes. Proximal XLT Shiley 6# Respiratory: visible distress with marked air hunger with very distant breaths sounds. Ventilator asynchrony with 8 cmH20 of auto PEEP. No tracheal deviation. CV: HS normal ABDO: soft. Extremities: no edema.  Assessment/Plan:    Critically ill due to acute respiratory failure due to significant air trapping. I matched her auto-PEEP and decreased her respiratory rate to allow for more complete exhalation and increased her sedation by adding fentanyl to improve ventilator compliance.  I changed her tracheostomy to a 6 distal tracheostomy to clear an area of tracheal scarring which was causing a ball-valve obstruction seen on bronchoscopy.  She was given of saline for poor urine output and tachycardia.  Critical Care Total Time*: 30 Minutes.  Daleen Bo Loyd [place] 02/23/2018  *Care during the described time interval was provided by me.  I have reviewed this patient's available data, including medical history, events of note, physical examination and test results as part of my evaluation.

## 2018-02-23 NOTE — Progress Notes (Signed)
MD just placed chest tube for pneumothorax.  Will hold on 1600 CPT.

## 2018-02-23 NOTE — Progress Notes (Signed)
Pharmacy Antibiotic Note  Danielle Stevens is a 47 y.o. female with h/o MDR Pseudomonas PNA.  Pharmacy has been consulted for Meropenem dosing.  Plan: Meropenem 1 g IV q8h  Weight: 139 lb 5.3 oz (63.2 kg)  Temp (24hrs), Avg:98.6 F (37 C), Min:97.2 F (36.2 C), Max:99.8 F (37.7 C)  Recent Labs  Lab 02/12/2018 1609 02/10/2018 1616  WBC 32.0*  --   CREATININE 0.90  --   LATICACIDVEN  --  1.44    Estimated Creatinine Clearance: 65.3 mL/min (by C-G formula based on SCr of 0.9 mg/dL).    Allergies  Allergen Reactions  . Wasp Venom Protein Shortness Of Breath  . Adhesive [Tape] Other (See Comments)    Bruises   . Coconut Oil Hives  . Latex Other (See Comments)    Bruises   . Robitussin Severe Multi-Symp [Phenylephrine-Dm-Gg-Apap] Diarrhea and Other (See Comments)    Allergic, per verbal MAR  . Shellfish-Derived Products Hives and Other (See Comments)    Allergic, per verbal MAR    Eddie Candle 02/23/2018 12:36 AM

## 2018-02-23 NOTE — Progress Notes (Addendum)
Initial Nutrition Assessment  DOCUMENTATION CODES:   Not applicable  INTERVENTION:    Vital High Protein at 50 ml/h (1200 ml per day)  Provides 1200 kcal (1601 kcal total with Propofol), 105 gm protein, 1003 ml free water daily  NUTRITION DIAGNOSIS:   Inadequate oral intake related to inability to eat as evidenced by NPO status.  GOAL:   Patient will meet greater than or equal to 90% of their needs  MONITOR:   Vent status, TF tolerance, Labs  REASON FOR ASSESSMENT:   Ventilator, Consult Enteral/tube feeding initiation and management  ASSESSMENT:   47 yo female with PMH of COPD, DM, HTN, alpha-1 antitrypsin deficiency, trach, chronic vent dependence, PEG, and anxiety who was admitted on 10/27 with worsening SOB r/t PNA.  Discussed patient with RN today. PEG site looks clean. Received MD Consult for TF initiation and management.  Patient is currently intubated on ventilator support MV: 10.6 L/min Temp (24hrs), Avg:98.8 F (37.1 C), Min:97.2 F (36.2 C), Max:100.2 F (37.9 C)  Propofol: 15.2 ml/hr providing 401 kcal from lipid  Labs reviewed.  CBG's: 159-146 Medications reviewed and include Solu-medrol, Propofol, Versed.   Reviewed TF orders from PTA in paper chart: Peptamen AF 1.2 230 ml every 4 hours. This provides 1656 kcal, 105 gm protein, 1118 ml free water daily. Also receives MVI daily and Reglan 1 ml every 4 hours via PEG.  Per review of weight encounters, patient has lost 25% of usual weight within the past 5 months.   Noted allergy to shellfish and coconut.  NUTRITION - FOCUSED PHYSICAL EXAM:    Most Recent Value  Orbital Region  No depletion  Upper Arm Region  No depletion  Thoracic and Lumbar Region  Unable to assess  Buccal Region  No depletion  Temple Region  No depletion  Clavicle Bone Region  No depletion  Clavicle and Acromion Bone Region  No depletion  Scapular Bone Region  Unable to assess  Dorsal Hand  Unable to assess  Patellar  Region  No depletion  Anterior Thigh Region  No depletion  Posterior Calf Region  No depletion  Edema (RD Assessment)  None  Hair  Reviewed  Eyes  Unable to assess  Mouth  Unable to assess  Skin  Reviewed  Nails  Unable to assess       Diet Order:   Diet Order            Diet NPO time specified  Diet effective now              EDUCATION NEEDS:   No education needs have been identified at this time  Skin:  Skin Assessment: Reviewed RN Assessment(MASD to buttocks)  Last BM:  10/27  Height:   Ht Readings from Last 1 Encounters:  02/23/18 5\' 4"  (1.626 m)    Weight:   Wt Readings from Last 1 Encounters:  02/23/18 63.2 kg    Ideal Body Weight:  54.5 kg  BMI:  Body mass index is 23.92 kg/m.  Estimated Nutritional Needs:   Kcal:  1640  Protein:  95-105 gm  Fluid:  1.6-1.8 L    Joaquin Courts, RD, LDN, CNSC Pager [phone] After Hours Pager [phone]

## 2018-02-23 NOTE — Progress Notes (Signed)
Upon arrival to patient room to do AM assessment, RT noted that patient had increased work of breathing and looked air hungry.  Noticed that ventilator settings were on FIO2 of 100% and PEEP of 10, unsure of when it was changed.  Attempted to suction patient however not able to pass catheter all the way.  Attempted to bag lavage patient and reposition trach and was only able to obtain a small amount of thick, yellow, secretions.  MD came to patient room.  Obtained an ABG and results given to MD.  Patient was changed back to pressure control ventilation (per MD) and PEEP dropped to 5. Stat chest xray ordered.  Checked trach position and positive color change was noted.  Will continue to monitor.

## 2018-02-23 NOTE — Progress Notes (Signed)
Ventilator changes made by MD.   

## 2018-02-23 NOTE — Progress Notes (Addendum)
FPTS Social Progress Note  S:Patient had increasing ventilator demands and required sedation overnight. CCM consulted and transitioned to CCM for management.  She is on propofol and versed to help with heart and sedation. Patient was sleeping during exam. Elevated heart rate with BP in the 80/50's.  O: BP (!) 94/35   Pulse (!) 150   Temp 97.6 F (36.4 C) (Oral)   Resp (!) 28   Wt 63.2 kg   SpO2 98%   BMI 23.92 kg/m   Physical Exam:  Gen: Sleeping on exam  Skin: Warm and dry. No obvious rashes, lesions, or trauma. HEENT: NCAT, MMM.  CV: RRR. RP & DPs 2+ bilaterally. No BLEE. Resp: Heard to access due to transmitted upper respiratory sounds. On ventillator with trach. Increased WOB Abd: Positive bowel sounds.  A/P: Family Medicine will continue to follow CCM care.  Plan to continue care when stable. Appreciate CCM care of patient.   Orpah Cobb [place], DO 02/23/2018, 8:22 AM PGY-1, [facility] 2 Family Medicine Service pager [phone]

## 2018-02-23 NOTE — Procedures (Signed)
Bronchoscopy Procedure Note Danielle Stevens [id number] January 03, 1971  Procedure: Bronchoscopy Indications: Diagnostic evaluation of the airways  Procedure Details Consent: Unable to obtain consent because of emergent medical necessity. Time Out: Verified patient identification, verified procedure, site/side was marked, verified correct patient position, special equipment/implants available, medications/allergies/relevent history reviewed, required imaging and test results available.  Performed  In preparation for procedure, patient was given 100% FiO2 and bronchoscope lubricated. Sedation: Benzodiazepines and fentanyl  Airway entered and the following bronchi were examined: RUL, RML, RLL, LUL, LLL and Bronchi.        The distal tip of the tracheostomy was intermittently obstructed by an area of the tracheal wall which didn't improve with repositioning.  The remainder of the airways were normal.  Obstruction was relieved by placing a #6 DISTAL XLT.  Some resistance was encountered on insertion, but position was confirmed by direct examination.  Evaluation Hemodynamic Status: Transient hypotension resolved spontaneously and treated with fluid; O2 sats: transiently fell during during procedure Patient's Current Condition: stable Complications: No apparent complications Patient did tolerate procedure well.   Daleen Bo Leandre Wien 02/23/2018

## 2018-02-23 NOTE — Progress Notes (Signed)
eLink Physician-Brief Progress Note Patient Name: Danielle Stevens DOB: 1971-01-16 MRN: [id number]   Date of Service  02/23/2018  HPI/Events of Note  ABG on 50%/PC 30/Rate = 14/P 5  = 7.28/57.2/73/27.1  eICU Interventions  Will order: 1. Change to 50%/PRVC 18/TV 600/P 5. 2. ABG at 5 AM.      Intervention Category Major Interventions: Acid-Base disturbance - evaluation and management;Respiratory failure - evaluation and management  Maleigh Bagot Dennard Nip 02/23/2018, 2:59 AM

## 2018-02-23 NOTE — Progress Notes (Signed)
NAME:  Danielle Stevens, MRN:  [id number], DOB:  17-Sep-1970, LOS: 1 ADMISSION DATE:  02/06/2018, CONSULTATION DATE:  10/27 REFERRING MD:  Owens Shark CHIEF COMPLAINT:  Dyspnea   Brief History   Danielle Stevens is a 47 y.o. 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  She was admitted 10/27 with multifocal PNA.  Past Medical History  A1AT deficiency, COPD, chronic trach with vent dependence, HoTN, DM.  Significant Hospital Events   10/27 > admit.  Consults: date of consult/date signed off & final recs:  PCCM.  Procedures (surgical and bedside):  None.  Significant Diagnostic Tests:  CXR 10/27 > multifocal PNA. 02/23/2018 chest x-ray reviewed by me increased right upper lobe middle lobe concerning for enlarging pneumonia  Micro Data:  Blood 10/27 >  Sputum 10/27 >   Antimicrobials:  Vanc 10/27 >  Cefepime 10/27 >    Subjective:  Currently sedated on mechanical ventilatory support.  Remains very air hungry despite heavy sedation. Objective:  Blood pressure 105/90, pulse (!) 144, temperature 97.6 F (36.4 C), temperature source Oral, resp. rate (!) 24, weight 63.2 kg, SpO2 98 %. CVP:  [15 mmHg] 15 mmHg  Vent Mode: PRVC FiO2 (%):  [40 %-100 %] 50 % Set Rate:  [14 bmp-24 bmp] 24 bmp Vt Set:  [530 mL-540 mL] 530 mL PEEP:  [5 cmH20-10 cmH20] 5 cmH20 Plateau Pressure:  [16 cmH20-210 cmH20] 31 cmH20   Intake/Output Summary (Last 24 hours) at 02/23/2018 0956 Last data filed at 02/23/2018 6553 Gross per 24 hour  Intake 1340.57 ml  Output 285 ml  Net 1055.57 ml   Filed Weights   02/18/2018 2100 02/23/18 0500  Weight: 63.2 kg 63.2 kg    Examination: General: Obese female heavily sedated and yet remains air hungry HEENT: Tracheostomy ventilatory support Neuro: Heavily sedated CV: Heart sounds regular sinus tachycardia 133 PULM: Coarse rhonchi bilaterally ZS:MOLM, non-tender, bsx4 active  Extremities:  warm/dry, 1+ edema  Skin: no rashes or lesions    Assessment & Plan:   Acute on chronic hypoxic / hypercapnic respiratory failure and A1AT deficiency with severe COPD - s/p tracheostomy.  Worsening pulmonary status on 02/23/2018 with increased infiltrate right lobe upper.  Very dyssynchronous with the vent requiring higher sedation. -Change vent to pressure control ventilation with a rate of 26 pressure control 25 5 of PEEP and will repeat arterial blood gases -She is not weanable. -Continue bronchodilators and steroids -We will give consideration to fiberoptic bronchoscopy -Repeat arterial blood gases and chest x-ray she has a history of a left pneumothorax rule out a recurrent pneumothorax. - IV sedation for vent tolerancein -Continue weekly Prolast    Sinus tachycardia -Check twelve-lead for completeness -Check troponin for completeness  Multifocal PNA.  History of Corynebacterium stratum.  History of positive MRSA.  Culture data is pending -Continue empirical antibiotics -Follow culture data -Question fibrotic bronchoscopy with BAL  Hx PE (on eliquis). -We will discontinue Eliquis 02/23/2018 -If we continue anticoagulation she will need to start heparin drip by 2300 hrs. tonight, notes she is approximately 6 months treatment with anticoagulation for PE in the recent past.  May just discontinue anticoagulation altogether -Reason for discontinuing anticoagulation chest x-ray has been taken to rule out recurrent pneumothorax.  Chronic hypertension.  Takes home [place] -Monitor blood pressure  Chronic anemia -Monitor CBC  Type 2 diabetes mellitus -Sliding scale insulin  Severe anxiety -Sedation as needed  Disposition / Summary of Today's Plan 02/23/18   Change service to pulmonary critical care Requiring multiple changes in ventilatory support.  May need fiberoptic bronchoscopy Placed back on pressure control ventilation Repeat chest x-ray and arterial blood  gases Note she has a history of pneumothorax on the left    Diet: Tube feeds. Pain/Anxiety/Delirium protocol (if indicated): N/A. VAP protocol (if indicated): N/A. DVT prophylaxis: SCD's / eliquis. GI prophylaxis: Famotidine. Hyperglycemia protocol: SSI. Mobility: Bedrest. Code Status: Full. Family Communication: 02/15/2018 no family at bedside  Labs   CBC: Recent Labs  Lab 02/24/2018 1609 02/23/18 0410  WBC 32.0* 24.7*  NEUTROABS 30.4* 21.0*  HGB 10.6* 11.3*  HCT 38.9 39.9  MCV 80.9 80.6  PLT 240 017   Basic Metabolic Panel: Recent Labs  Lab 02/26/2018 1609 02/23/18 0410  NA 138 137  K 4.1 4.3  CL 98 104  CO2 30 27  GLUCOSE 141* 163*  BUN 27* 18  CREATININE 0.90 0.68  CALCIUM 10.0 9.3   GFR: Estimated Creatinine Clearance: 73.5 mL/min (by C-G formula based on SCr of 0.68 mg/dL). Recent Labs  Lab 02/07/2018 1609 02/14/2018 1616 02/23/18 0410  WBC 32.0*  --  24.7*  LATICACIDVEN  --  1.44  --    Liver Function Tests: Recent Labs  Lab 02/11/2018 1609 02/23/18 0410  AST 51* 39  ALT 63* 63*  ALKPHOS 145* 138*  BILITOT 0.6 1.0  PROT 7.1 6.2*  ALBUMIN 3.2* 2.6*   No results for input(s): LIPASE, AMYLASE in the last 168 hours. No results for input(s): AMMONIA in the last 168 hours. ABG    Component Value Date/Time   PHART 7.215 (L) 02/23/2018 0503   PCO2ART 70.7 (HH) 02/23/2018 0503   PO2ART 185.0 (H) 02/23/2018 0503   HCO3 28.3 (H) 02/23/2018 0503   TCO2 30 02/23/2018 0503   ACIDBASEDEF 1.0 02/23/2018 0503   O2SAT 99.0 02/23/2018 0503    Coagulation Profile: No results for input(s): INR, PROTIME in the last 168 hours. Cardiac Enzymes: No results for input(s): CKTOTAL, CKMB, CKMBINDEX, TROPONINI in the last 168 hours. HbA1C: Hgb A1c MFr Bld  Date/Time Value Ref Range Status  09/12/2017 05:17 AM 5.1 4.8 - 5.6 % Final    Comment:    (NOTE) Pre diabetes:          5.7%-6.4% Diabetes:              >6.4% Glycemic control for   <7.0% adults with  diabetes    CBG: Recent Labs  Lab 02/19/2018 1832 02/05/2018 1943 02/23/18 0023 02/23/18 0424 02/23/18 0751  GLUCAP 203* 130* 146* 159* 146*    Admitting History of Present Illness.   Danielle Stevens is a 47 y.o. female who has a PMH as outlined below including but not limited to tracheostomy and chronic ventilator dependence secondary to COPD with A1AT deficiency (not a candidate for transplant at [facility] and [facility] in the past) and obesity.  She resides at [place].  She was admitted 10/27 with multifocal PNA.    PCCM consulted for assistance with vent management.  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  02/23/2018 pulmonary critical care will assume her care to the severity of her lung disease and complexity of her ventilatory support.    Review of Systems:   Unable to obtain as pt is on mechanical ventilation.  Past medical history  She,  has a past medical history of Alpha-1-antitrypsin deficiency (  [place]), Anemia, Anxiety, Asthma, COPD (chronic obstructive pulmonary disease) ([place]), Diabetes mellitus without complication ([place]), Emphysema lung ([place]), and Hypertension.   Surgical History    Past Surgical History:  Procedure Laterality Date  . PEG TUBE PLACEMENT    . PORT-A-CATH REMOVAL Left 08/26/2017   Procedure: REMOVAL PORT-A-CATH;  Surgeon: Donnie Mesa, MD;  Location: [place];  Service: General;  Laterality: Left;  . PORTACATH PLACEMENT Right   . TRACHEOSTOMY       Social History   Social History   Socioeconomic History  . Marital status: Single    Spouse name: Not on file  . Number of children: Not on file  . Years of education: Not on file  . Highest education level: Not on file  Occupational History  . Not on file  Social Needs  . Financial resource strain: Not on file  . Food insecurity:    Worry: Not on file    Inability: Not on file  . Transportation needs:    Medical: Not on file     Non-medical: Not on file  Tobacco Use  . Smoking status: Former Smoker    Types: Cigarettes  . Smokeless tobacco: Never Used  Substance and Sexual Activity  . Alcohol use: Not Currently  . Drug use: Never  . Sexual activity: Not on file  Lifestyle  . Physical activity:    Days per week: Not on file    Minutes per session: Not on file  . Stress: Not on file  Relationships  . Social connections:    Talks on phone: Not on file    Gets together: Not on file    Attends religious service: Not on file    Active member of club or organization: Not on file    Attends meetings of clubs or organizations: Not on file    Relationship status: Not on file  . Intimate partner violence:    Fear of current or ex partner: Not on file    Emotionally abused: Not on file    Physically abused: Not on file    Forced sexual activity: Not on file  Other Topics Concern  . Not on file  Social History Narrative  . Not on file  ,  reports that she has quit smoking. Her smoking use included cigarettes. She has never used smokeless tobacco. She reports that she drank alcohol. She reports that she does not use drugs.   Family history   Her family history is not on file.   Allergies Allergies  Allergen Reactions  . Wasp Venom Protein Shortness Of Breath  . Adhesive [Tape] Other (See Comments)    Bruises   . Coconut Oil Hives  . Latex Other (See Comments)    Bruises   . Robitussin Severe Multi-Symp [Phenylephrine-Dm-Gg-Apap] Diarrhea and Other (See Comments)    Allergic, per verbal MAR  . Shellfish-Derived Products Hives and Other (See Comments)    Allergic, per verbal MAR    Home meds  Prior to Admission medications   Medication Sig Start Date End Date Taking? Authorizing Provider  acetaminophen (TYLENOL) 325 MG tablet Place 650 mg into feeding tube every 6 (six) hours.   Yes [provider]  albuterol (PROVENTIL) (2.5 MG/3ML) 0.083% nebulizer solution Take 3 mLs (2.5 mg total) by  nebulization every 6 (six) hours. 11/06/17  Yes Oswald Hillock, MD  alpha-1-proteinase inhibitor, human, (PROLASTIN) 1000 MG/20ML SOLN Inject 4,964 mg into the vein once a week. 10/24/17  Yes Cherene Altes,  MD  apixaban (ELIQUIS) 5 MG TABS tablet Place 1 tablet (5 mg total) into feeding tube 2 (two) times daily. 11/06/17  Yes Lama, Marge Duncans, MD  budesonide (PULMICORT) 0.5 MG/2ML nebulizer solution Take 2 mLs (0.5 mg total) by nebulization 2 (two) times daily. 10/20/17  Yes Cherene Altes, MD  chlorhexidine gluconate, MEDLINE KIT, (PERIDEX) 0.12 % solution 15 mLs by Mouth Rinse route 2 (two) times daily. 10/20/17  Yes Cherene Altes, MD  clonazePAM (KLONOPIN) 1 MG tablet Place 1 tablet (1 mg total) into feeding tube every 8 (eight) hours. 10/20/17  Yes Cherene Altes, MD  diphenhydrAMINE (BENADRYL) 25 mg capsule Place 25 mg into feeding tube every 8 (eight) hours as needed for itching.   Yes [provider]  famotidine (PEPCID) 40 MG/5ML suspension Place 2.5 mLs (20 mg total) into feeding tube every 12 (twelve) hours. 11/06/17  Yes Oswald Hillock, MD  furosemide (LASIX) 10 MG/ML injection Inject 2 mLs (20 mg total) into the vein every 12 (twelve) hours. 11/06/17  Yes Lama, Marge Duncans, MD  insulin aspart (NOVOLOG) 100 UNIT/ML injection Inject 0-15 Units into the skin every 4 (four) hours. Patient taking differently: Inject 0-20 Units into the skin every 6 (six) hours. Blood sugar <70 or >400 Notify MD. BS 70-120=0u, 121-150=3u, 151-200=4u, 201-250=7u, 251-300=11u, 301-350=15u, 351-400=20u 10/20/17  Yes Cherene Altes, MD  Lactobacillus (ACIDOPHILUS PO) Place 1 capsule into feeding tube daily.    Yes [provider]  levalbuterol (XOPENEX) 1.25 MG/3ML nebulizer solution Take 3 mLs by nebulization every 6 (six) hours. 02/11/2018  Yes [provider]  LORazepam (ATIVAN) 1 MG tablet Place 1 tablet (1 mg total) into feeding tube every 2 (two) hours as needed for anxiety. 10/20/17   Yes Cherene Altes, MD  metoCLOPramide (REGLAN) 5 MG/ML injection Inject 1 mL (5 mg total) into the vein every 6 (six) hours. 11/06/17  Yes Darrick Meigs, Marge Duncans, MD  midodrine (PROAMATINE) 5 MG tablet Place 1 tablet (5 mg total) into feeding tube 3 (three) times daily with meals. 10/20/17  Yes Cherene Altes, MD  Multiple Vitamin (MULTIVITAMIN WITH MINERALS) TABS tablet Place 1 tablet into feeding tube daily.    Yes [provider]  Nutritional Supplements (FEEDING SUPPLEMENT, VITAL HIGH PROTEIN,) LIQD liquid Place 1,000 mLs into feeding tube continuous. Patient taking differently: Place 230 mLs into feeding tube every 4 (four) hours. PEPTAMEN AF 11/06/17  Yes Darrick Meigs, Marge Duncans, MD  ondansetron [facility]) 4 MG/2ML SOLN injection Inject 2 mLs (4 mg total) into the vein every 6 (six) hours as needed for nausea or vomiting. 11/06/17  Yes Darrick Meigs, Marge Duncans, MD  oxyCODONE (ROXICODONE) 5 MG/5ML solution Place 5 mg into feeding tube every 6 (six) hours as needed for moderate pain.    Yes [provider]  pantoprazole sodium (PROTONIX) 40 mg/20 mL PACK Place 20 mLs (40 mg total) into feeding tube daily. 11/07/17  Yes Oswald Hillock, MD  PARoxetine (PAXIL) 30 MG tablet Place 1 tablet (30 mg total) into feeding tube daily. Patient taking differently: Place 40 mg into feeding tube daily.  10/20/17  Yes Cherene Altes, MD  PAZEO 0.7 % SOLN Place 1 drop into both eyes daily. 02/24/2018  Yes [provider]  polyethylene glycol (MIRALAX / GLYCOLAX) packet Place 17 g into feeding tube daily. 11/07/17  Yes Oswald Hillock, MD  predniSONE (DELTASONE) 20 MG tablet Place 1 tablet (20 mg total) into feeding tube daily with breakfast. Patient  taking differently: Place 40 mg into feeding tube daily with breakfast.  11/07/17  Yes Darrick Meigs, Marge Duncans, MD  promethazine (PHENERGAN) 25 MG/ML injection Inject 0.5 mLs (12.5 mg total) into the vein every 6 (six) hours as needed for nausea or vomiting. 10/20/17  Yes Cherene Altes, MD  QUEtiapine (SEROQUEL) 100 MG tablet Place 1 tablet (100 mg total) into feeding tube 2 (two) times daily. Patient taking differently: Place 100 mg into feeding tube every 12 (twelve) hours.  09/25/17  Yes Odetta Forness, Grace Bushy, NP  rOPINIRole (REQUIP) 0.5 MG tablet Place 1 tablet (0.5 mg total) into feeding tube at bedtime. 10/20/17  Yes Cherene Altes, MD  Sodium Chloride, Inhalant, 7 % NEBU Inhale 1 vial into the lungs every 6 (six) hours.   Yes [provider]  albuterol (PROVENTIL) (2.5 MG/3ML) 0.083% nebulizer solution Take 3 mLs (2.5 mg total) by nebulization every 2 (two) hours as needed for wheezing or shortness of breath. Patient not taking: Reported on 02/14/2018 11/06/17   Oswald Hillock, MD  Amino Acids-Protein Hydrolys (FEEDING SUPPLEMENT, PRO-STAT SUGAR FREE 64,) LIQD Place 30 mLs into feeding tube 2 (two) times daily. Patient not taking: Reported on 02/10/2018 11/06/17   Oswald Hillock, MD  mouth rinse LIQD solution 15 mLs by Mouth Rinse route 2 times daily at 12 noon and 4 pm. Patient not taking: Reported on 02/02/2018 11/06/17   Oswald Hillock, MD    Critical care time: 36mn.    SRichardson LandryMinor ACNP LMaryanna ShapePCCM Pager [phone] 1 pm If no answer page [phone]/28/2019, 9:56 AM

## 2018-02-23 NOTE — Progress Notes (Signed)
PCCM Interval Progress Note  Currently on 27mcg/kg/min and BP gradually dropping. Will ask RN to decrease propofol and will add midazolam gtt 0-5mg /hr. Might need PRN paralytic dosing if continues to have increased WOB.  Once stabilized, would likely benefit from bedside bronchoscopy.   Rutherford Guys, [place] - [facility] [place] [facility] Pager: [phone]  or [phone] 02/23/2018, 5:36 AM

## 2018-02-23 NOTE — Progress Notes (Signed)
ANTICOAGULATION CONSULT NOTE - Initial Consult  Pharmacy Consult for heparin Indication: pulmonary embolus  Allergies  Allergen Reactions  . Wasp Venom Protein Shortness Of Breath  . Adhesive [Tape] Other (See Comments)    Bruises   . Coconut Oil Hives  . Latex Other (See Comments)    Bruises   . Robitussin Severe Multi-Symp [Phenylephrine-Dm-Gg-Apap] Diarrhea and Other (See Comments)    Allergic, per verbal MAR  . Shellfish-Derived Products Hives and Other (See Comments)    Allergic, per verbal MAR    Patient Measurements: Weight: 139 lb 5.3 oz (63.2 kg)   Vital Signs: Temp: 97.6 F (36.4 C) (10/28 0700) Temp Source: Oral (10/28 0700) BP: 105/90 (10/28 0900) Pulse Rate: 135 (10/28 1000)  Labs: Recent Labs    02/21/2018 1609 02/23/18 0410  HGB 10.6* 11.3*  HCT 38.9 39.9  PLT 240 227  CREATININE 0.90 0.68    Estimated Creatinine Clearance: 73.5 mL/min (by C-G formula based on SCr of 0.68 mg/dL).  Assessment: CC/HPI: 47 yo f presenting with PNA  PMH: chronic trach, a1at deficiency, copd, dm  Anticoag: apixaban pta for PE through nov 2019; dc apixaban and change to heparin 10/28>> Last dose apixaban 1000 this morning  Renal: SCr 0.68  Pulm: chronic trach - on vent  Heme/Onc: H&H 11.3/39.9, Plt 227  Goal of Therapy:  Heparin level 0.3-0.7 units/ml aPTT 66 - 102 seconds Monitor platelets by anticoagulation protocol: Yes   Plan:  Dc apixaban  Change to hep gtt 1000 units/hr 2200 HL aPTT CBC daily  Isaac Bliss, PharmD, BCPS, BCCCP Clinical Pharmacist [phone]  Please check AMION for all [facility] Pharmacy numbers  02/23/2018 10:13 AM

## 2018-02-23 NOTE — Progress Notes (Addendum)
PCCM Progress Note  Persistent anxiety despite precedex. Precedex ceiling increased by [facility].  Already on scheduled klonopin, PRN ativan, PRN morphine, scheduled paxil, scheduled quetiapine, scheduled ropinirole.   Will add low dose fentanyl q1hr PRN.  Pt with well documented severe anxiety.  Same issues on last admission.  Would use frequent reassurance vs adding / increasing meds (already on multiple anxiolytics / sedative medications).   ADDENDUM: Pt very dysynchronous with vent.  Tachy to 150's, RR in 40s. CXR with worsening RUL PNA. Possible mucous plugging.   ABG 7.21 / 70 / 185.  Discussed with Dr. Carlota Raspberry.  Will sedate fully with propofol and place on full vent support while allowing pt to rest.  Continue abx, steroids. Can try SBT and WUA in AM.  Hopefully can avoid prolonged sedation. Add mucomyst nebs.  Defer bronch for now as pt will likely not tolerate.   Rutherford Guys, [place] - C [place] [facility] Pager: [phone]  or [phone] 02/23/2018, 4:49 AM

## 2018-02-23 NOTE — Progress Notes (Signed)
eLink Physician-Brief Progress Note Patient Name: Danielle Stevens DOB: 27-Aug-1970 MRN: [id number]   Date of Service  02/23/2018  HPI/Events of Note  Multiple issues: 1. Anxiety and 2. Sinus Tachycardia - HR = 138.   eICU Interventions  Will order: 1. Seroquel 100 mg per tube now.  2. Increase Seroquel to 150 mg per tube BID. 3. Bolus with 0.9 NaCl 1 liter IV over 1 hour now.      Intervention Category Major Interventions: Arrhythmia - evaluation and management;Delirium, psychosis, severe agitation - evaluation and management  [name],[name] 02/23/2018, 2:41 AM

## 2018-02-23 NOTE — Procedures (Signed)
Chest Tube Insertion Procedure Note  Indications:  Clinically significant Pneumothorax  Pre-operative Diagnosis: Pneumothorax  Post-operative Diagnosis: Pneumothorax  Procedure Details  Informed consent was obtained for the procedure, including sedation.  Risks of lung perforation, hemorrhage, arrhythmia, and adverse drug reaction were discussed.   After sterile skin prep, using standard technique, a 16 French tube was placed in the right anterior 4th rib space.  Findings: Positive air leak after tube repositioned.  Estimated Blood Loss:  Minimal         Specimens:  None              Complications:  None; patient tolerated the procedure well.         Disposition: ICU - intubated and critically ill.         Condition: unstable  Attending Attestation: I performed the procedure.  Lynnell Catalan, MD [facility] ICU Physician [facility] [place] [facility]  Pager: [phone] Mobile: [phone] After hours: [phone].

## 2018-02-24 ENCOUNTER — Inpatient Hospital Stay (HOSPITAL_COMMUNITY): Payer: Medicare Other

## 2018-02-24 DIAGNOSIS — J9621 Acute and chronic respiratory failure with hypoxia: Secondary | ICD-10-CM

## 2018-02-24 DIAGNOSIS — A419 Sepsis, unspecified organism: Secondary | ICD-10-CM | POA: Diagnosis not present

## 2018-02-24 DIAGNOSIS — R0603 Acute respiratory distress: Secondary | ICD-10-CM | POA: Diagnosis not present

## 2018-02-24 LAB — POCT I-STAT 3, ART BLOOD GAS (G3+)
Acid-Base Excess: 1 mmol/L (ref 0.0–2.0)
BICARBONATE: 28.2 mmol/L — AB (ref 20.0–28.0)
O2 SAT: 100 %
PCO2 ART: 58.3 mmHg — AB (ref 32.0–48.0)
Patient temperature: 99
TCO2: 30 mmol/L (ref 22–32)
pH, Arterial: 7.294 — ABNORMAL LOW (ref 7.350–7.450)
pO2, Arterial: 202 mmHg — ABNORMAL HIGH (ref 83.0–108.0)

## 2018-02-24 LAB — GLUCOSE, CAPILLARY
GLUCOSE-CAPILLARY: 191 mg/dL — AB (ref 70–99)
GLUCOSE-CAPILLARY: 205 mg/dL — AB (ref 70–99)
GLUCOSE-CAPILLARY: 249 mg/dL — AB (ref 70–99)
Glucose-Capillary: 284 mg/dL — ABNORMAL HIGH (ref 70–99)
Glucose-Capillary: 209 mg/dL — ABNORMAL HIGH (ref 70–99)
Glucose-Capillary: 236 mg/dL — ABNORMAL HIGH (ref 70–99)

## 2018-02-24 LAB — CBC WITH DIFFERENTIAL/PLATELET
Abs Immature Granulocytes: 0.95 10*3/uL — ABNORMAL HIGH (ref 0.00–0.07)
BASOS PCT: 0 %
Basophils Absolute: 0.1 10*3/uL (ref 0.0–0.1)
EOS ABS: 6.7 10*3/uL — AB (ref 0.0–0.5)
EOS PCT: 30 %
HCT: 34.9 % — ABNORMAL LOW (ref 36.0–46.0)
Hemoglobin: 9.6 g/dL — ABNORMAL LOW (ref 12.0–15.0)
IMMATURE GRANULOCYTES: 4 %
Lymphocytes Relative: 3 %
Lymphs Abs: 0.6 10*3/uL — ABNORMAL LOW (ref 0.7–4.0)
MCH: 22.3 pg — AB (ref 26.0–34.0)
MCHC: 27.5 g/dL — ABNORMAL LOW (ref 30.0–36.0)
MCV: 81 fL (ref 80.0–100.0)
MONO ABS: 1.4 10*3/uL — AB (ref 0.1–1.0)
MONOS PCT: 6 %
Neutro Abs: 12.9 10*3/uL — ABNORMAL HIGH (ref 1.7–7.7)
Neutrophils Relative %: 57 %
Platelets: 237 10*3/uL (ref 150–400)
RBC: 4.31 MIL/uL (ref 3.87–5.11)
RDW: 16.4 % — AB (ref 11.5–15.5)
WBC: 22.4 10*3/uL — AB (ref 4.0–10.5)
nRBC: 0.1 % (ref 0.0–0.2)

## 2018-02-24 LAB — BASIC METABOLIC PANEL
ANION GAP: 8 (ref 5–15)
BUN: 34 mg/dL — AB (ref 6–20)
CALCIUM: 8.9 mg/dL (ref 8.9–10.3)
CO2: 24 mmol/L (ref 22–32)
Chloride: 106 mmol/L (ref 98–111)
Creatinine, Ser: 0.89 mg/dL (ref 0.44–1.00)
GFR calc Af Amer: >60 mL/min (ref >60)
GFR calc non Af Amer: >60 mL/min (ref >60)
GLUCOSE: 277 mg/dL — AB (ref 70–99)
Potassium: 4.1 mmol/L (ref 3.5–5.1)
Sodium: 138 mmol/L (ref 135–145)

## 2018-02-24 LAB — APTT
APTT: 45 s — AB (ref 24–36)
APTT: 59 s — AB (ref 24–36)
aPTT: 39 seconds — ABNORMAL HIGH (ref 24–36)

## 2018-02-24 LAB — MAGNESIUM: Magnesium: 2 mg/dL (ref 1.7–2.4)

## 2018-02-24 LAB — HEPARIN LEVEL (UNFRACTIONATED)

## 2018-02-24 LAB — PROCALCITONIN: PROCALCITONIN: 39.39 ng/mL

## 2018-02-24 LAB — PHOSPHORUS: Phosphorus: 3.6 mg/dL (ref 2.5–4.6)

## 2018-02-24 MED ORDER — SODIUM CHLORIDE 0.9 % IV SOLN
INTRAVENOUS | Status: DC
  Administered 2018-02-24 – 2018-02-28 (×4): via INTRAVENOUS

## 2018-02-24 MED ORDER — VECURONIUM BROMIDE 10 MG IV SOLR
6.0000 mg | Freq: Once | INTRAVENOUS | Status: AC
  Administered 2018-02-24: 6 mg via INTRAVENOUS
  Filled 2018-02-24: qty 10

## 2018-02-24 MED ORDER — SENNOSIDES 8.8 MG/5ML PO SYRP
5.0000 mL | ORAL_SOLUTION | Freq: Every day | ORAL | Status: DC
  Administered 2018-02-24 – 2018-02-28 (×5): 5 mL
  Filled 2018-02-24 (×6): qty 5

## 2018-02-24 MED ORDER — INSULIN ASPART 100 UNIT/ML ~~LOC~~ SOLN
0.0000 [IU] | SUBCUTANEOUS | Status: DC
  Administered 2018-02-24: 5 [IU] via SUBCUTANEOUS
  Administered 2018-02-24: 3 [IU] via SUBCUTANEOUS
  Administered 2018-02-24 – 2018-02-25 (×3): 5 [IU] via SUBCUTANEOUS
  Administered 2018-02-25: 3 [IU] via SUBCUTANEOUS
  Administered 2018-02-25: 5 [IU] via SUBCUTANEOUS
  Administered 2018-02-25 – 2018-02-26 (×6): 3 [IU] via SUBCUTANEOUS
  Administered 2018-02-26 (×2): 2 [IU] via SUBCUTANEOUS
  Administered 2018-02-26: 5 [IU] via SUBCUTANEOUS
  Administered 2018-02-27: 2 [IU] via SUBCUTANEOUS
  Administered 2018-02-27: 8 [IU] via SUBCUTANEOUS
  Administered 2018-02-27 (×3): 3 [IU] via SUBCUTANEOUS
  Administered 2018-02-27: 5 [IU] via SUBCUTANEOUS
  Administered 2018-02-28 (×3): 3 [IU] via SUBCUTANEOUS
  Administered 2018-02-28: 5 [IU] via SUBCUTANEOUS
  Administered 2018-02-28: 2 [IU] via SUBCUTANEOUS
  Administered 2018-02-28: 5 [IU] via SUBCUTANEOUS
  Administered 2018-03-01: 3 [IU] via SUBCUTANEOUS
  Administered 2018-03-01: 2 [IU] via SUBCUTANEOUS
  Administered 2018-03-01: 3 [IU] via SUBCUTANEOUS
  Administered 2018-03-01: 2 [IU] via SUBCUTANEOUS

## 2018-02-24 MED ORDER — INSULIN GLARGINE 100 UNIT/ML ~~LOC~~ SOLN
10.0000 [IU] | Freq: Every day | SUBCUTANEOUS | Status: DC
  Administered 2018-02-24 – 2018-03-01 (×6): 10 [IU] via SUBCUTANEOUS
  Filled 2018-02-24 (×6): qty 0.1

## 2018-02-24 MED ORDER — DOCUSATE SODIUM 50 MG/5ML PO LIQD
200.0000 mg | Freq: Every day | ORAL | Status: DC
  Administered 2018-02-24 – 2018-03-01 (×6): 200 mg
  Filled 2018-02-24 (×6): qty 20

## 2018-02-24 MED ORDER — EMPTY CONTAINERS FLEXIBLE MISC: 4964.0000 mg | Status: DC

## 2018-02-24 NOTE — Progress Notes (Signed)
ANTICOAGULATION CONSULT NOTE - Initial Consult  Pharmacy Consult for heparin Indication: pulmonary embolus  Allergies  Allergen Reactions  . Wasp Venom Protein Shortness Of Breath  . Adhesive [Tape] Other (See Comments)    Bruises   . Coconut Oil Hives  . Latex Other (See Comments)    Bruises   . Robitussin Severe Multi-Symp [Phenylephrine-Dm-Gg-Apap] Diarrhea and Other (See Comments)    Allergic, per verbal MAR  . Shellfish-Derived Products Hives and Other (See Comments)    Allergic, per verbal MAR    Patient Measurements: Height: 5\' 4"  (162.6 cm) Weight: 142 lb 3.2 oz (64.5 kg) IBW/kg (Calculated) : 54.7   Vital Signs: Temp: 99 F (37.2 C) (10/29 1108) Temp Source: Oral (10/29 1108) BP: 107/73 (10/29 1200) Pulse Rate: 122 (10/29 1200)  Labs: Recent Labs    02/07/2018 1609 02/23/18 0410 02/23/18 1053 02/24/18 0339 02/24/18 1034  HGB 10.6* 11.3*  --  9.6*  --   HCT 38.9 39.9  --  34.9*  --   PLT 240 227  --  237  --   APTT  --   --   --  45* 39*  HEPARINUNFRC  --   --   --  >2.20*  --   CREATININE 0.90 0.68  --  0.89  --   TROPONINI  --   --  <0.03  --   --     Estimated Creatinine Clearance: 66 mL/min (by C-G formula based on SCr of 0.89 mg/dL).  Assessment: CC/HPI: 47 yo f presenting with PNA  PMH: chronic trach, a1at deficiency, copd, dm  Anticoag: apixaban pta for PE through nov 2019; dc apixaban and change to heparin 10/28>> Last dose apixaban 1000 this morning aptt low this am at 39  Renal: SCr 0.68  Pulm: chronic trach - on vent  Heme/Onc: H&H 11.3/39.9, Plt 227  Goal of Therapy:  Heparin level 0.3-0.7 units/ml aPTT 66 - 102 seconds Monitor platelets by anticoagulation protocol: Yes   Plan:  Increase hep gtt  to 1350 units/hr  2000 aPTT HL aPTT CBC daily F/U return to apixaban  Isaac Bliss, PharmD, BCPS, BCCCP Clinical Pharmacist [phone]  Please check AMION for all [facility] Pharmacy numbers  02/24/2018 12:52 PM

## 2018-02-24 NOTE — Progress Notes (Signed)
ANTICOAGULATION CONSULT NOTE - Initial Consult  Pharmacy Consult for heparin Indication: pulmonary embolus  Allergies  Allergen Reactions  . Wasp Venom Protein Shortness Of Breath  . Adhesive [Tape] Other (See Comments)    Bruises   . Coconut Oil Hives  . Latex Other (See Comments)    Bruises   . Robitussin Severe Multi-Symp [Phenylephrine-Dm-Gg-Apap] Diarrhea and Other (See Comments)    Allergic, per verbal MAR  . Shellfish-Derived Products Hives and Other (See Comments)    Allergic, per verbal MAR    Patient Measurements: Height: 5\' 4"  (162.6 cm) Weight: 142 lb 3.2 oz (64.5 kg) IBW/kg (Calculated) : 54.7   Vital Signs: Temp: 98.1 F (36.7 C) (10/29 1915) Temp Source: Oral (10/29 1915) BP: 104/64 (10/29 2000) Pulse Rate: 112 (10/29 2000)  Labs: Recent Labs    03/15/2018 1609 02/23/18 0410 02/23/18 1053 02/24/18 0339 02/24/18 1034 02/24/18 1945  HGB 10.6* 11.3*  --  9.6*  --   --   HCT 38.9 39.9  --  34.9*  --   --   PLT 240 227  --  237  --   --   APTT  --   --   --  45* 39* 59*  HEPARINUNFRC  --   --   --  >2.20*  --   --   CREATININE 0.90 0.68  --  0.89  --   --   TROPONINI  --   --  <0.03  --   --   --     Estimated Creatinine Clearance: 66 mL/min (by C-G formula based on SCr of 0.89 mg/dL).  Assessment: CC/HPI: 47 yo f presenting with PNA  PMH: chronic trach, a1at deficiency, copd, dm  Anticoag: apixaban pta for PE through nov 2019; dc apixaban and change to heparin 10/28>> Last dose apixaban 1000 this morning  APTT remains low but up to 59  Goal of Therapy:  Heparin level 0.3-0.7 units/ml aPTT 66 - 102 seconds Monitor platelets by anticoagulation protocol: Yes   Plan:  Increase heparin gtt to 1,450 units/hr Monitor daily aPTT and heparin level, CBC, s/s of bleed F/U return to apixaban  Enzo Bi, PharmD, BCPS Clinical Pharmacist Phone number [phone] 02/24/2018 8:57 PM

## 2018-02-24 NOTE — Progress Notes (Signed)
FPTS Social Progress Note  Appreciate the exception care provided by critical care Ms. Viernes. FPTS will continue to follow. Will be happy to accept care once clinical status improves.   Joana Reamer, DO 02/24/2018, 9:00 AM PGY-1, [facility] Service pager [phone]

## 2018-02-24 NOTE — Progress Notes (Signed)
Interim progress note:  Requested to review insulin coverage by RN as patient's cbgs >200 since starting tube feeds late 10/28.  Patient is not insulin naive and was one a more liberal dosing prior to admission.  Will order moderate sliding scale Q4 instead of sensitive.  -Dr. Parke Simmers

## 2018-02-24 NOTE — Progress Notes (Signed)
ANTICOAGULATION CONSULT NOTE  Pharmacy Consult for heparin Indication: pulmonary embolus  Allergies  Allergen Reactions  . Wasp Venom Protein Shortness Of Breath  . Adhesive [Tape] Other (See Comments)    Bruises   . Coconut Oil Hives  . Latex Other (See Comments)    Bruises   . Robitussin Severe Multi-Symp [Phenylephrine-Dm-Gg-Apap] Diarrhea and Other (See Comments)    Allergic, per verbal MAR  . Shellfish-Derived Products Hives and Other (See Comments)    Allergic, per verbal MAR    Patient Measurements: Height: 5\' 4"  (162.6 cm) Weight: 142 lb 3.2 oz (64.5 kg) IBW/kg (Calculated) : 54.7   Vital Signs: Temp: 98.5 F (36.9 C) (10/29 0313) Temp Source: Oral (10/29 0313) BP: 94/79 (10/29 0430) Pulse Rate: 113 (10/29 0430)  Labs: Recent Labs    02/07/2018 1609 02/23/18 0410 02/23/18 1053 02/24/18 0339  HGB 10.6* 11.3*  --  9.6*  HCT 38.9 39.9  --  34.9*  PLT 240 227  --  237  APTT  --   --   --  45*  HEPARINUNFRC  --   --   --  >2.20*  CREATININE 0.90 0.68  --  0.89  TROPONINI  --   --  <0.03  --     Estimated Creatinine Clearance: 66 mL/min (by C-G formula based on SCr of 0.89 mg/dL).  Assessment: 47 y.o. female with h/o PE, Eliquis on hold, for heparin  Goal of Therapy:  Heparin level 0.3-0.7 units/ml aPTT 66 - 102 seconds Monitor platelets by anticoagulation protocol: Yes   Plan:  Increase Heparin 1200 units/hr  aPTT in 8 hours  Geannie Risen, PharmD, BCPS  02/24/2018 5:13 AM

## 2018-02-24 NOTE — Progress Notes (Signed)
   NAME:  Danielle Stevens, MRN:  [id number], DOB:  December 10, 1970, LOS: 2 ADMISSION DATE:  02/02/2018,   Brief History   Danielle Stevens is a 47 y.o. 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  She was admitted 10/27 with multifocal PNA..  A1AT deficiency, COPD, chronic trach with vent dependence, HoTN, DM. Increasing respiratory distress 10/29: tracheostomy changed to distal XLT to relieve partially obstructing granulation tissue. R chest tube inserted for large R pneumothorax.  Subjective:  Remained calm overnight. Required on dose of vecuronium for dyssynchrony overnight. NE weaned off at 0400. Has followed simple commands per RN.  Objective    Examination: Vital Signs: sinus tachycardia, normotensive off norepinephrine. HENT: Tracheostomy tube in place with no bleeding. Lungs: On PRVC ventilation with Pplat at 20, PEEPt 11. Better air entry bilaterally.  Intermittent air leak with tidal breathing. Chest tube remains in position. Cardiovascular: Extremities warm, no mottling, HS normal. Abdomen: PEG tube in place, BS rare Extremities: No edema.  Neuro: Sedated on versed and fentanyl, opens eyes to commands GU: Concentrated urine in foley.  Assessment & Plan:   Assessment Critically ill due acute on chronic respiratory failure requiring titration of mechanical ventilation, due to HCAP on background of end-stage COPD. Complicated by pneumothorax. Sedation titration required to prevent ventilator asynchrony Hypotension from increased intrathoracic pressure resolved. Ongoing treatment for recent PE  Plan Continue PRVC ventilation. Wean PEEP down to 5 today as no longer generating auto PEEP.  Maintain high FiO2 to help reabsorb pneumothorax. Minimize sedation targeting comfort and ventilator synchrony Continue IV heparin. Monitor for hypotension. Remove A-line if remains off remains off NE.  Best Practice    Nutritional status and diet: Tolerating feeds at goal. Add bowel regimen Pain/Anxiety/Delirium reduction: Wean sedation today. VAP protocol (if indicated) in place DVT prophylaxis:  IV heparin GI prophylaxis: Famotidine Hyperglycemia protocol: control still sub-optimal - add basal Lantus.to moderate SSI Mobility: bed-bound chronically Antibiotic de-escalation: complete 7 days of antibiotics Code Status: Full Family Communication: have discussed condition with daughter over the phone 10/28.  Understands that patient may require eventual limits on care.  Labs and Ancillary Testing (personally reviewed)  CBC: leukocytosis and anemia of chronic disease  Chemistry: normal chemistry.  ABG: improving respiratory acidosis  Coagulation Profile: heparin assay supratherapeutic (had been on apixaban)  Cardiac Enzymes: N/A  CBG: remain elevated in 200's  Microbiology: NGTD.  CRITICAL CARE Performed by: Lynnell Catalan   Total critical care time: 40 minutes  Critical care time was exclusive of separately billable procedures and treating other patients.  Critical care was necessary to treat or prevent imminent or life-threatening deterioration.  Critical care was time spent personally by me on the following activities: development of treatment plan with patient and/or surrogate as well as nursing, discussions with consultants, evaluation of patient's response to treatment, examination of patient, obtaining history from patient or surrogate, ordering and performing treatments and interventions, ordering and review of laboratory studies, ordering and review of radiographic studies, pulse oximetry, re-evaluation of patient's condition, and participation in multidisciplinary rounds.   Lynnell Catalan, MD [facility] ICU Physician [facility] [place] [facility]  Pager: [phone] Mobile: [phone] After hours: [phone].

## 2018-02-24 NOTE — Progress Notes (Addendum)
Inpatient Diabetes Program Recommendations  AACE/ADA: New Consensus Statement on Inpatient Glycemic Control (2019)  Target Ranges:  Prepandial:   less than 140 mg/dL      Peak postprandial:   less than 180 mg/dL (1-2 hours)      Critically ill patients:  140 - 180 mg/dL   Results for Danielle Stevens, Danielle Stevens (MRN [id number]) as of 02/24/2018 12:02  Ref. Range 02/23/2018 07:51 02/23/2018 11:53 02/23/2018 17:25 02/23/2018 19:41 02/23/2018 23:33 02/24/2018 03:12 02/24/2018 07:58 02/24/2018 11:05  Glucose-Capillary Latest Ref Range: 70 - 99 mg/dL 865 (H) 784 (H) 696 (H) 250 (H) 260 (H) 284 (H) 209 (H) 236 (H)   Review of Glycemic Control  Diabetes history: DM2 Outpatient Diabetes medications: Novolog 0-15 units Q4H Current orders for Inpatient glycemic control: Lantus 10 units daily, Novolog 0-15 units Q4H; Solumedrol 40 mg Q6H  Inpatient Diabetes Program Recommendations:  Insulin - Basal: Noted Lantus 10 units daily was ordered today. Insulin - Tube Feeding Coverage: Noted glucose more elevated since tube feedings started yesterday evening.  May want to consider ordering Novolog 3 units Q4H for tube feeding coverage if CBGs continue to be elevated.  Thanks, Orlando Penner, RN, MSN, CDE Diabetes Coordinator Inpatient Diabetes Program [phone] (Team Pager from 8am to 5pm)

## 2018-02-25 ENCOUNTER — Inpatient Hospital Stay (HOSPITAL_COMMUNITY): Payer: Medicare Other

## 2018-02-25 DIAGNOSIS — R0603 Acute respiratory distress: Secondary | ICD-10-CM | POA: Diagnosis not present

## 2018-02-25 DIAGNOSIS — A419 Sepsis, unspecified organism: Secondary | ICD-10-CM | POA: Diagnosis not present

## 2018-02-25 DIAGNOSIS — J9621 Acute and chronic respiratory failure with hypoxia: Secondary | ICD-10-CM | POA: Diagnosis not present

## 2018-02-25 LAB — COMPREHENSIVE METABOLIC PANEL
ALT: 49 U/L — ABNORMAL HIGH (ref 0–44)
ANION GAP: 3 — AB (ref 5–15)
AST: 34 U/L (ref 15–41)
Albumin: 2.1 g/dL — ABNORMAL LOW (ref 3.5–5.0)
Alkaline Phosphatase: 92 U/L (ref 38–126)
BILIRUBIN TOTAL: 0.4 mg/dL (ref 0.3–1.2)
BUN: 38 mg/dL — ABNORMAL HIGH (ref 6–20)
CALCIUM: 10 mg/dL (ref 8.9–10.3)
CO2: 33 mmol/L — ABNORMAL HIGH (ref 22–32)
Chloride: 108 mmol/L (ref 98–111)
Creatinine, Ser: 0.56 mg/dL (ref 0.44–1.00)
Glucose, Bld: 181 mg/dL — ABNORMAL HIGH (ref 70–99)
POTASSIUM: 4.5 mmol/L (ref 3.5–5.1)
Sodium: 144 mmol/L (ref 135–145)
TOTAL PROTEIN: 5.9 g/dL — AB (ref 6.5–8.1)

## 2018-02-25 LAB — GLUCOSE, CAPILLARY
GLUCOSE-CAPILLARY: 178 mg/dL — AB (ref 70–99)
GLUCOSE-CAPILLARY: 183 mg/dL — AB (ref 70–99)
GLUCOSE-CAPILLARY: 214 mg/dL — AB (ref 70–99)
GLUCOSE-CAPILLARY: 154 mg/dL — AB (ref 70–99)
Glucose-Capillary: 158 mg/dL — ABNORMAL HIGH (ref 70–99)
Glucose-Capillary: 176 mg/dL — ABNORMAL HIGH (ref 70–99)

## 2018-02-25 LAB — CBC
HCT: 30.6 % — ABNORMAL LOW (ref 36.0–46.0)
Hemoglobin: 8.3 g/dL — ABNORMAL LOW (ref 12.0–15.0)
MCH: 22 pg — ABNORMAL LOW (ref 26.0–34.0)
MCHC: 27.1 g/dL — AB (ref 30.0–36.0)
MCV: 81.2 fL (ref 80.0–100.0)
NRBC: 0 % (ref 0.0–0.2)
Platelets: 161 10*3/uL (ref 150–400)
RBC: 3.77 MIL/uL — ABNORMAL LOW (ref 3.87–5.11)
RDW: 16.2 % — ABNORMAL HIGH (ref 11.5–15.5)
WBC: 13 10*3/uL — AB (ref 4.0–10.5)

## 2018-02-25 LAB — APTT: APTT: 67 s — AB (ref 24–36)

## 2018-02-25 LAB — HEPARIN LEVEL (UNFRACTIONATED): HEPARIN UNFRACTIONATED: 1.04 [IU]/mL — AB (ref 0.30–0.70)

## 2018-02-25 MED ORDER — OXYCODONE HCL 5 MG/5ML PO SOLN
5.0000 mg | Freq: Four times a day (QID) | ORAL | Status: DC | PRN
  Administered 2018-02-26: 5 mg
  Filled 2018-02-25: qty 5

## 2018-02-25 MED ORDER — TOBRAMYCIN 300 MG/5ML IN NEBU
300.0000 mg | INHALATION_SOLUTION | Freq: Two times a day (BID) | RESPIRATORY_TRACT | Status: AC
  Administered 2018-02-25 – 2018-02-26 (×4): 300 mg via RESPIRATORY_TRACT
  Filled 2018-02-25 (×4): qty 5

## 2018-02-25 MED ORDER — SODIUM CHLORIDE 0.9 % IV SOLN
3.0000 g | Freq: Three times a day (TID) | INTRAVENOUS | Status: DC
  Administered 2018-02-25 – 2018-03-01 (×13): 3 g via INTRAVENOUS
  Filled 2018-02-25 (×15): qty 22.8

## 2018-02-25 MED ORDER — METOPROLOL TARTRATE 5 MG/5ML IV SOLN
2.5000 mg | INTRAVENOUS | Status: DC | PRN
  Administered 2018-03-01 (×2): 5 mg via INTRAVENOUS
  Filled 2018-02-25 (×2): qty 5

## 2018-02-25 MED ORDER — LORAZEPAM 2 MG/ML IJ SOLN
4.0000 mg | Freq: Once | INTRAMUSCULAR | Status: AC
  Administered 2018-02-25: 4 mg via INTRAVENOUS

## 2018-02-25 MED ORDER — NAPROXEN 125 MG/5ML PO SUSP
250.0000 mg | Freq: Two times a day (BID) | ORAL | Status: DC | PRN
  Filled 2018-02-25: qty 10

## 2018-02-25 MED ORDER — ETOMIDATE 2 MG/ML IV SOLN
20.0000 mg | Freq: Once | INTRAVENOUS | Status: AC
  Administered 2018-02-25: 20 mg via INTRAVENOUS

## 2018-02-25 MED ORDER — METHYLPREDNISOLONE SODIUM SUCC 125 MG IJ SOLR
40.0000 mg | Freq: Every day | INTRAMUSCULAR | Status: AC
  Administered 2018-02-26 – 2018-02-28 (×3): 40 mg via INTRAVENOUS
  Filled 2018-02-25 (×3): qty 2

## 2018-02-25 MED ORDER — APIXABAN 5 MG PO TABS
5.0000 mg | ORAL_TABLET | Freq: Two times a day (BID) | ORAL | Status: DC
  Administered 2018-02-25 – 2018-03-01 (×9): 5 mg via ORAL
  Filled 2018-02-25 (×10): qty 1

## 2018-02-25 MED ORDER — LORAZEPAM 2 MG/ML IJ SOLN
INTRAMUSCULAR | Status: AC
  Filled 2018-02-25: qty 2

## 2018-02-25 MED FILL — Medication: Qty: 1 | Status: AC

## 2018-02-25 NOTE — Progress Notes (Signed)
  Called to room. Triggering high pressure.  Able to pass suction tube w/out difficulty.   PEEP increased to 10 and resulted in resolution of issue. ? Dynamic airway collapse?   Plan Cont wean FIO2 Keep PEEP at 10   Simonne Martinet ACNP-BC [facility] Pager # [phone] OR # [phone] if no answer

## 2018-02-25 NOTE — Progress Notes (Signed)
150mg  x1 Amio given for patient HR in 200's while MD present.

## 2018-02-25 NOTE — Progress Notes (Signed)
ANTICOAGULATION CONSULT NOTE - Initial Consult  Pharmacy Consult for apixaban Indication: pulmonary embolus  Allergies  Allergen Reactions  . Wasp Venom Protein Shortness Of Breath  . Adhesive [Tape] Other (See Comments)    Bruises   . Coconut Oil Hives  . Latex Other (See Comments)    Bruises   . Robitussin Severe Multi-Symp [Phenylephrine-Dm-Gg-Apap] Diarrhea and Other (See Comments)    Allergic, per verbal MAR  . Shellfish-Derived Products Hives and Other (See Comments)    Allergic, per verbal MAR    Patient Measurements: Height: '5\' 4"'$  (162.6 cm) Weight: 141 lb 1.5 oz (64 kg) IBW/kg (Calculated) : 54.7  Vital Signs: Temp: 98.8 F (37.1 C) (10/30 0700) Temp Source: Oral (10/30 0700) BP: 105/44 (10/30 0818) Pulse Rate: 117 (10/30 0818)  Labs: Recent Labs    02/23/18 0410 02/23/18 1053  02/24/18 0339 02/24/18 1034 02/24/18 1945 02/25/18 0359 02/25/18 0650  HGB 11.3*  --   --  9.6*  --   --  8.3*  --   HCT 39.9  --   --  34.9*  --   --  30.6*  --   PLT 227  --   --  237  --   --  161  --   APTT  --   --    < > 45* 39* 59* 67*  --   HEPARINUNFRC  --   --   --  >2.20*  --   --  1.04*  --   CREATININE 0.68  --   --  0.89  --   --   --  0.56  TROPONINI  --  <0.03  --   --   --   --   --   --    < > = values in this interval not displayed.    Estimated Creatinine Clearance: 73.5 mL/min (by C-G formula based on SCr of 0.56 mg/dL).   Medical History: Past Medical History:  Diagnosis Date  . Alpha-1-antitrypsin deficiency ([place])   . Anemia   . Anxiety   . Asthma   . COPD (chronic obstructive pulmonary disease) ([place])   . Diabetes mellitus without complication ([place])   . Emphysema lung ([place])   . Hypertension     Medications:  Scheduled:  . apixaban  5 mg Oral BID  . budesonide  0.5 mg Nebulization BID  . chlorhexidine gluconate (MEDLINE KIT)  15 mL Mouth Rinse BID  . Chlorhexidine Gluconate Cloth  6 each Topical Daily  . docusate  200 mg Per Tube Daily  .  famotidine  20 mg Per Tube Q12H  . feeding supplement (VITAL HIGH PROTEIN)  1,000 mL Per Tube Q24H  . fentaNYL (SUBLIMAZE) injection  50 mcg Intravenous Once  . insulin aspart  0-15 Units Subcutaneous Q4H  . insulin glargine  10 Units Subcutaneous Daily  . ipratropium-albuterol  3 mL Nebulization Q6H  . mouth rinse  15 mL Mouth Rinse 10 times per day  . [START ON 02/26/2018] methylPREDNISolone (SOLU-MEDROL) injection  40 mg Intravenous Daily  . multivitamin  15 mL Per Tube Daily  . PARoxetine  30 mg Per Tube Daily  . QUEtiapine  150 mg Per Tube BID  . rOPINIRole  0.5 mg Per Tube QHS  . sennosides  5 mL Per Tube QHS   Infusions:  . sodium chloride 10 mL/hr at 02/25/18 0900  . [START ON 02/27/2018] alpha-1 proteinase inhibitor (PROLASTIN-C) infusion    . meropenem (MERREM) IV 1 g (02/25/18 0933)  .  midazolam (VERSED) infusion Stopped (02/24/18 0936)  . norepinephrine (LEVOPHED) Adult infusion Stopped (02/24/18 0543)  . sodium chloride Stopped (02/23/18 0300)    Assessment: 47 year old female admitted 10/27 with pneumonia. Pharmacy has been consulted for reinitiating apixaban . Patient had recent history of pulmonary embolism in June 2019, and was started on apixaban. While inpatient, patient was started on heparin drip 10/28 for chest tube placement. Patient's Hgb and pltc decreased overnight, but no issues with bleeding per nurse.   Goal of Therapy:  Monitor platelets by anticoagulation protocol: Yes   Plan:  Discontinue heparin drip Resume home apixaban '5mg'$  PO BID Monitor CBC, signs/symptoms of bleeding  Thank you for allowing pharmacy to be a part of this patient's care.  Leron Croak, PharmD PGY1 Pharmacy Resident Phone: [phone]  Please check AMION for all [place] phone numbers 02/25/2018,10:02 AM

## 2018-02-25 NOTE — Progress Notes (Signed)
   NAME:  Danielle Stevens, MRN:  [id number], DOB:  May 25, 1970, LOS: 3 ADMISSION DATE:  02/09/2018,   Brief History   Danielle Stevens is a 47 y.o. 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  She was admitted 10/27 with multifocal PNA..  A1AT deficiency, COPD, chronic trach with vent dependence, HoTN, DM. Increasing respiratory distress 10/29: tracheostomy changed to distal XLT to relieve partially obstructing granulation tissue. R chest tube inserted for large R pneumothorax.  Subjective:  Episode of [name] associated with mucous plugging related desaturation.  Transient. Patient reports back pain today. Denies dyspnea.   Objective    Examination: Vital Signs: sinus tachycardia, normotensive off norepinephrine. HENT: Tracheostomy tube in place with no bleeding. Lungs: On PRVC ventilation. Better air entry bilaterally with no wheezing.  Intermittent small air leak with tidal breathing. Chest tube remains in position. Tolerating PSV  Cardiovascular: Extremities warm, no mottling, HS normal. Abdomen: PEG tube in place, BS rare. Soft. Extremities: No edema.  Neuro: Awake and talking around tracheostomy. GU: Clear urine in catheter.  Assessment & Plan:   Assessment Critically ill due acute on chronic respiratory failure requiring titration of mechanical ventilation, due to HCAP on background of end-stage COPD. Complicated by pneumothorax. No longer showing ventilator asynchrony. Hypotension from increased intrathoracic pressure resolved. Ongoing treatment for recent PE  Plan Start PSV weaning trials Progressive mobilization to chair. Complete 10 days of meropenem Short taper for steroids Continue -10 suction, CXR daily Switch to oral pain medication: oxycodone and NSAID prn No procedures anticipated - resume eliquis   Best Practices (discussed on multidisciplinary rounds)  Nutritional status and diet:  Tolerating feeds at goal. BM this morning. Pain/Anxiety/Delirium reduction: Stop iv meds VAP protocol (if indicated) in place DVT prophylaxis: eliquis GI prophylaxis: Famotidine Hyperglycemia protocol: control acceptable - add basal Lantus.to moderate SSI Mobility: bed-bound chronically Antibiotic de-escalation: complete 10 days of antibiotics given pseudomonas in patient with compromised lungs.  Code Status: Full Family Communication: have discussed condition with daughter over the phone 10/28.  Understands that patient may require eventual limits on care.  Labs and Ancillary Testing (personally reviewed)  CBC: Leucocytosis markedly improved at 13, mild anemia 8.3 (microcytic, hypochromic) Chemistry: normal chemistry.  ABG: improving respiratory acidosis  Coagulation Profile: heparin assay therapeutic.  Cardiac Enzymes: N/A  CBG: improved to 170-180 range.  Microbiology: few pseudomonas in tracheal aspirate.  CXR 10/30: much improved lung expansion with no residual pneumothorax, chest tube remains in position. R upper lobe anterior segment infiltrate.  CRITICAL CARE Performed by: Lynnell Catalan   Total critical care time: 35 minutes  Critical care time was exclusive of separately billable procedures and treating other patients.  Critical care was necessary to treat or prevent imminent or life-threatening deterioration.  Critical care was time spent personally by me on the following activities: development of treatment plan with patient and/or surrogate as well as nursing, discussions with consultants, evaluation of patient's response to treatment, examination of patient, obtaining history from patient or surrogate, ordering and performing treatments and interventions, ordering and review of laboratory studies, ordering and review of radiographic studies, pulse oximetry, re-evaluation of patient's condition, and participation in multidisciplinary rounds.   Lynnell Catalan, MD  [facility] ICU Physician [facility] [place] [facility]  Pager: [phone] Mobile: [phone] After hours: [phone].

## 2018-02-25 NOTE — Progress Notes (Signed)
150 ml of Fentanyl wasted with Cinda Quest, RN.

## 2018-02-25 NOTE — Progress Notes (Signed)
40mL of Versed wasted in the sink witnessed by Priscella Mann, RN

## 2018-02-26 ENCOUNTER — Inpatient Hospital Stay (HOSPITAL_COMMUNITY): Payer: Medicare Other

## 2018-02-26 DIAGNOSIS — J449 Chronic obstructive pulmonary disease, unspecified: Secondary | ICD-10-CM

## 2018-02-26 DIAGNOSIS — E119 Type 2 diabetes mellitus without complications: Secondary | ICD-10-CM

## 2018-02-26 DIAGNOSIS — Z930 Tracheostomy status: Secondary | ICD-10-CM

## 2018-02-26 DIAGNOSIS — Z91013 Allergy to seafood: Secondary | ICD-10-CM

## 2018-02-26 DIAGNOSIS — F419 Anxiety disorder, unspecified: Secondary | ICD-10-CM

## 2018-02-26 DIAGNOSIS — J151 Pneumonia due to Pseudomonas: Secondary | ICD-10-CM | POA: Diagnosis not present

## 2018-02-26 DIAGNOSIS — Z91038 Other insect allergy status: Secondary | ICD-10-CM

## 2018-02-26 DIAGNOSIS — Z87891 Personal history of nicotine dependence: Secondary | ICD-10-CM

## 2018-02-26 DIAGNOSIS — A419 Sepsis, unspecified organism: Secondary | ICD-10-CM | POA: Diagnosis not present

## 2018-02-26 DIAGNOSIS — E8801 Alpha-1-antitrypsin deficiency: Secondary | ICD-10-CM | POA: Diagnosis not present

## 2018-02-26 DIAGNOSIS — Z1624 Resistance to multiple antibiotics: Secondary | ICD-10-CM

## 2018-02-26 DIAGNOSIS — R0603 Acute respiratory distress: Secondary | ICD-10-CM | POA: Diagnosis not present

## 2018-02-26 DIAGNOSIS — Z888 Allergy status to other drugs, medicaments and biological substances status: Secondary | ICD-10-CM

## 2018-02-26 DIAGNOSIS — I10 Essential (primary) hypertension: Secondary | ICD-10-CM

## 2018-02-26 DIAGNOSIS — Z91040 Latex allergy status: Secondary | ICD-10-CM

## 2018-02-26 DIAGNOSIS — Z91048 Other nonmedicinal substance allergy status: Secondary | ICD-10-CM

## 2018-02-26 DIAGNOSIS — Z91018 Allergy to other foods: Secondary | ICD-10-CM

## 2018-02-26 DIAGNOSIS — J9621 Acute and chronic respiratory failure with hypoxia: Secondary | ICD-10-CM | POA: Diagnosis not present

## 2018-02-26 LAB — GLUCOSE, CAPILLARY
GLUCOSE-CAPILLARY: 130 mg/dL — AB (ref 70–99)
GLUCOSE-CAPILLARY: 189 mg/dL — AB (ref 70–99)
GLUCOSE-CAPILLARY: 213 mg/dL — AB (ref 70–99)
GLUCOSE-CAPILLARY: 153 mg/dL — AB (ref 70–99)
Glucose-Capillary: 126 mg/dL — ABNORMAL HIGH (ref 70–99)

## 2018-02-26 LAB — CBC
HEMATOCRIT: 33.1 % — AB (ref 36.0–46.0)
HEMOGLOBIN: 9.3 g/dL — AB (ref 12.0–15.0)
MCH: 22.8 pg — AB (ref 26.0–34.0)
MCHC: 28.1 g/dL — AB (ref 30.0–36.0)
MCV: 81.1 fL (ref 80.0–100.0)
Platelets: 156 10*3/uL (ref 150–400)
RBC: 4.08 MIL/uL (ref 3.87–5.11)
RDW: 16.4 % — ABNORMAL HIGH (ref 11.5–15.5)
WBC: 16.5 10*3/uL — ABNORMAL HIGH (ref 4.0–10.5)
nRBC: 0.2 % (ref 0.0–0.2)

## 2018-02-26 MED ORDER — SODIUM CHLORIDE 0.9 % IV SOLN
0.0000 mg/h | INTRAVENOUS | Status: DC
  Administered 2018-02-26: 0.5 mg/h via INTRAVENOUS
  Administered 2018-02-27: 5 mg/h via INTRAVENOUS
  Administered 2018-02-27: 2 mg/h via INTRAVENOUS
  Administered 2018-03-01 (×2): 5 mg/h via INTRAVENOUS
  Filled 2018-02-26 (×6): qty 10

## 2018-02-26 MED ORDER — MIDAZOLAM BOLUS VIA INFUSION
2.0000 mg | INTRAVENOUS | Status: DC | PRN
  Administered 2018-02-27 – 2018-02-28 (×4): 2 mg via INTRAVENOUS
  Filled 2018-02-26: qty 2

## 2018-02-26 MED ORDER — EMPTY CONTAINERS FLEXIBLE MISC
4348.0000 mg | Status: DC
  Administered 2018-02-27: 4348 mg via INTRAVENOUS
  Filled 2018-02-26: qty 4348

## 2018-02-26 MED ORDER — FENTANYL BOLUS VIA INFUSION
50.0000 ug | Freq: Once | INTRAVENOUS | Status: DC
  Filled 2018-02-26: qty 50

## 2018-02-26 MED ORDER — CLONAZEPAM 1 MG PO TABS
1.0000 mg | ORAL_TABLET | Freq: Two times a day (BID) | ORAL | Status: DC
  Administered 2018-02-26: 1 mg
  Filled 2018-02-26: qty 1

## 2018-02-26 MED ORDER — FENTANYL 2500MCG IN NS 250ML (10MCG/ML) PREMIX INFUSION
0.0000 ug/h | INTRAVENOUS | Status: DC
  Administered 2018-02-26: 400 ug/h via INTRAVENOUS
  Administered 2018-02-26: 25 ug/h via INTRAVENOUS
  Administered 2018-02-27 (×2): 400 ug/h via INTRAVENOUS
  Filled 2018-02-26 (×4): qty 250

## 2018-02-26 MED ORDER — LORAZEPAM 2 MG/ML IJ SOLN
2.0000 mg | INTRAMUSCULAR | Status: DC | PRN
  Administered 2018-02-26: 2 mg via INTRAVENOUS
  Filled 2018-02-26: qty 1

## 2018-02-26 MED ORDER — CHLORHEXIDINE GLUCONATE CLOTH 2 % EX PADS
6.0000 | MEDICATED_PAD | Freq: Every day | CUTANEOUS | Status: DC
  Administered 2018-02-27 – 2018-02-28 (×3): 6 via TOPICAL

## 2018-02-26 NOTE — Consult Note (Addendum)
[facility] for Infectious Disease    Date of Admission:  02/12/2018   Total days of antibiotics 5        Day 1 Zerbaxa                Reason for Consult: MDRO Pseudomonas pneumonia     Referring Provider: PCCM  Primary Care Provider: Patient, No Pcp Per   Assessment: Danielle Stevens is a 47 y.o. F with chronic tracheostomy and resident of [facility] that presented on 10/27 with worsening tachypnea, thick secretions from trach, hypoxia and fever. She grew out a resistant pseudomonas and is now on zerbaxa where she was previously on meropenem x 3 days. Developed pneumo on the R following bronchoscopy. She tells me she is feeling better since coming to the hospital. Her severe respiratory disease and likely chronic colonization of trach can make this difficult to interpret however in the context of her symptoms would continue treatment x 10 days with zerbaxa. Unable to tolerate the inahled tobra d/t bronchspasm and this is now stopped. Will need ongoing pulmonary hygiene as she has been getting to help reduce re-occurrence.   She is not moving air on the right side and is awaiting re-insertion of her chest tube from what I understand.    Plan: 1.  Continue zerbaxa through 11/09.    Principal Problem:   Pseudomonas pneumonia ([place]) Active Problems:   Sepsis ([place])   . apixaban  5 mg Oral BID  . budesonide  0.5 mg Nebulization BID  . chlorhexidine gluconate (MEDLINE KIT)  15 mL Mouth Rinse BID  . Chlorhexidine Gluconate Cloth  6 each Topical Daily  . clonazePAM  1 mg Per Tube BID  . docusate  200 mg Per Tube Daily  . famotidine  20 mg Per Tube Q12H  . feeding supplement (VITAL HIGH PROTEIN)  1,000 mL Per Tube Q24H  . fentaNYL  50 mcg Intravenous Once  . fentaNYL (SUBLIMAZE) injection  50 mcg Intravenous Once  . insulin aspart  0-15 Units Subcutaneous Q4H  . insulin glargine  10 Units Subcutaneous Daily  . ipratropium-albuterol  3 mL Nebulization Q6H  . mouth  rinse  15 mL Mouth Rinse 10 times per day  . methylPREDNISolone (SOLU-MEDROL) injection  40 mg Intravenous Daily  . multivitamin  15 mL Per Tube Daily  . PARoxetine  30 mg Per Tube Daily  . QUEtiapine  150 mg Per Tube BID  . rOPINIRole  0.5 mg Per Tube QHS  . sennosides  5 mL Per Tube QHS  . tobramycin (PF)  300 mg Nebulization BID    HPI: Danielle Stevens is a 47 y.o. female with past medical history described below but significant for chronic trach from LTAC ([facility]), Alpha-1 antitrypsin deficiency, COPD, DM, HTN.   She presented from [facility] with increased work of breathing, increased secretions, fever and hypoxia. CXR revealed patchy b/l upper lobe infiltrates and groung-glass opacities. On 10/28 her respiratory status worsened and was found to have a new 30-40% ptx following bronchoscopy which required a chest tube insertion. In June of this year she presented similarly with spontaneous L pneumothorax - completed 10-d course of meropenem as it was sensitive to carbapenems at that time. She again has pseudomonas from tracheal aspirate now resistant - started on a course of zerbaxa per Dr. Megan Salon. Danielle Stevens indicates to me that she feels a lot better and her breathing has improved since coming to the hospital.  Review of Systems: Review of Systems  Constitutional: Negative for chills, fever, malaise/fatigue and weight loss.  HENT: Negative for sore throat.   Respiratory: Positive for cough, sputum production and shortness of breath.   Cardiovascular: Negative.  Negative for chest pain.  Gastrointestinal: Negative for abdominal pain, diarrhea and vomiting.  Genitourinary: Negative for dysuria.  Musculoskeletal: Negative for joint pain, myalgias and neck pain.  Skin: Negative for rash.  Neurological: Negative for headaches.  Psychiatric/Behavioral: Negative for depression and substance abuse. The patient is nervous/anxious.   History from chart  Past Medical History:  Diagnosis  Date  . Alpha-1-antitrypsin deficiency ([place])   . Anemia   . Anxiety   . Asthma   . COPD (chronic obstructive pulmonary disease) ([place])   . Diabetes mellitus without complication ([place])   . Emphysema lung ([place])   . Hypertension     Social History   Tobacco Use  . Smoking status: Former Smoker    Types: Cigarettes  . Smokeless tobacco: Never Used  Substance Use Topics  . Alcohol use: Not Currently  . Drug use: Never    History reviewed. No pertinent family history.  No family history obtained from the patient due to being non verbal Allergies  Allergen Reactions  . Wasp Venom Protein Shortness Of Breath  . Adhesive [Tape] Other (See Comments)    Bruises   . Coconut Oil Hives  . Latex Other (See Comments)    Bruises   . Robitussin Severe Multi-Symp [Phenylephrine-Dm-Gg-Apap] Diarrhea and Other (See Comments)    Allergic, per verbal MAR  . Shellfish-Derived Products Hives and Other (See Comments)    Allergic, per verbal MAR    OBJECTIVE: Blood pressure (!) 139/97, pulse (!) 124, temperature (!) 96.2 F (35.7 C), temperature source Axillary, resp. rate (!) 21, height _0  (1.626 m), weight 65.9 kg, SpO2 94 %.  Physical Exam  Constitutional: She is oriented to person, place, and time. She appears well-developed and well-nourished.  Sitting straight up in bed on ventilator/trach. Tachypneic and tachycardic. R CT was recently removed. Able to communicate and indicates she feels much better.   HENT:  Mouth/Throat: Mucous membranes are normal. No oral lesions. Normal dentition. No dental abscesses. No oropharyngeal exudate.  Cardiovascular: Regular rhythm and normal heart sounds. Tachycardia present.  Pulmonary/Chest: Effort normal.  Very diminished/absent over RUL, diminished LUL. Bases are clear, less diminished.  FIO2 60% PEEP 10 Thick tan pink-tinged secretions in tube. Was recently suctioned.   Abdominal: Soft. She exhibits no distension. There is no tenderness.    Lymphadenopathy:    She has no cervical adenopathy.  Neurological: She is alert and oriented to person, place, and time.  Skin: Skin is warm and dry. No rash noted.  Facial flushing   Psychiatric: She has a normal mood and affect.    Lab Results Lab Results  Component Value Date   WBC 16.5 (H) 02/26/2018   HGB 9.3 (L) 02/26/2018   HCT 33.1 (L) 02/26/2018   MCV 81.1 02/26/2018   PLT 156 02/26/2018    Lab Results  Component Value Date   CREATININE 0.56 02/25/2018   BUN 38 (H) 02/25/2018   NA 144 02/25/2018   K 4.5 02/25/2018   CL 108 02/25/2018   CO2 33 (H) 02/25/2018    Lab Results  Component Value Date   ALT 49 (H) 02/25/2018   AST 34 02/25/2018   ALKPHOS 92 02/25/2018   BILITOT 0.4 02/25/2018     Microbiology: BCx 10/27 >> pending  Resp Cx 10/27 >> Pseudomonas (R-ceftaz, cipro, imepenem, cefepime, pip-tazo)  Janene Madeira, MSN, NP-C [facility] for Infectious [place] Cell: [phone] Pager: [phone]  02/26/2018 10:14 AM

## 2018-02-26 NOTE — Progress Notes (Signed)
NAME:  Danielle Stevens, MRN:  [id number], DOB:  06-06-1970, LOS: 4 ADMISSION DATE:  02/13/2018,   Brief History   Danielle Stevens is a 47 y.o. 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  She was admitted 10/27 with multifocal PNA..  A1AT deficiency, COPD, chronic trach with vent dependence, HoTN, DM. Increasing respiratory distress 10/29: tracheostomy changed to distal XLT to relieve partially obstructing granulation tissue. R chest tube inserted for large R pneumothorax. Repeated episodes of high pressure and respiratory distress without obvious cause, possibly improved following bronchodilator or sedation. Tracheostomy remains in good position.  Subjective:  [name] with increased dyspnea yesterday corrected with amiodarone and lorazepam.  This morning, patient complains of increased dyspnea, improved in sitting position, which has also relieved her chronic low back pain.   Objective    Examination: Vital Signs: sinus tachycardia, normotensive off norepinephrine. HENT: Tracheostomy tube in place with no bleeding.  HME clogged with secretions. Lungs: On PRVC ventilation. Better air entry bilaterally with bronchial breathing over RLL superior segment, mild  wheezing. Patient sitting upright in distress but taking adequate tidal volumes. Intermittent small air leak with tidal breathing. Chest tube has migrated.  Cardiovascular: Extremities warm, no mottling, HS normal. Abdomen: PEG tube in place, BS rare. Soft. Extremities: No edema.  Neuro: Awake and talking around tracheostomy. GU: Clear urine in catheter. Chronic Urology placed Foley catheter.    Bedside ultrasound shows bilateral lung sliding.   Assessment & Plan:   Assessment Critically ill due acute on chronic respiratory failure requiring titration of mechanical ventilation. HCAP due to highly resistant Pseudomonas End stage COPD with increased dead  space ventilation and copious secretions causing ongoing episodes of respiratory distress.   Complicated by pneumothorax.  Hypotension from increased intrathoracic pressure resolved. Ongoing treatment for recent PE  Plan Continue PSV weaning trials - success limited by secretions and increased resistance from HME, but humidified heated wire not available. Progressive mobilization to chair if possible. Switched antibiotics to Avycaz  and inhaled tobramycin for double coverage.  Will stop the tobi-nebs as seem to be worsening bronchospasm. Short taper for steroids Will remove chest tube as appears to have migrated out of chest. Monitor for recurrence.  Continue multimodal pain control: oxycodone and NSAID prn No procedures anticipated - resumed eliquis   Best Practices (discussed on multidisciplinary rounds)  Nutritional status and diet: Tolerating feeds at goal. BM this morning. Pain/Anxiety/Delirium reduction: Stopped iv meds VAP protocol (if indicated) in place DVT prophylaxis: eliquis GI prophylaxis: Famotidine Hyperglycemia protocol: control acceptable - add basal Lantus.to moderate SSI Mobility: bed-bound chronically Antibiotic de-escalation: tobramycin and ceftazidime-avibactam.  Code Status: Full Family Communication: have discussed condition with daughter over the phone 10/28.  Understands that patient may require eventual limits on care.  Labs and Ancillary Testing (personally reviewed)  CBC: Leucocytosis increased to 16.5, mild anemia 9.3 (microcytic, hypochromic)  Chemistry: normal chemistry.  ABG: improving respiratory acidosis  Coagulation Profile: heparin assay therapeutic.  Cardiac Enzymes: N/A  CBG: improved to 170-180 range.  Microbiology: few pseudomonas in tracheal aspirate. Resistant to all antibiotics except gentamicin.  CXR 10/31: much improved lung expansion with no residual pneumothorax, chest tube has migrated out of position. R mid lung field   infiltrate.  CRITICAL CARE Performed by: Lynnell Catalan   Total critical care time: 40 minutes  Critical care time was exclusive of separately billable procedures and treating other patients.  Critical care was necessary  to treat or prevent imminent or life-threatening deterioration.  Critical care was time spent personally by me on the following activities: development of treatment plan with patient and/or surrogate as well as nursing, discussions with consultants, evaluation of patient's response to treatment, examination of patient, obtaining history from patient or surrogate, ordering and performing treatments and interventions, ordering and review of laboratory studies, ordering and review of radiographic studies, pulse oximetry, re-evaluation of patient's condition, and participation in multidisciplinary rounds.   Lynnell Catalan, MD [facility] ICU Physician [facility] [place] Critical Care  Pager: [phone] Mobile: [phone] After hours: [phone].

## 2018-02-26 NOTE — Progress Notes (Signed)
Patient still having labored breathing, provider notified at bedside.

## 2018-02-27 ENCOUNTER — Other Ambulatory Visit: Payer: Self-pay

## 2018-02-27 ENCOUNTER — Inpatient Hospital Stay (HOSPITAL_COMMUNITY): Payer: Medicare Other

## 2018-02-27 DIAGNOSIS — Y95 Nosocomial condition: Secondary | ICD-10-CM | POA: Diagnosis not present

## 2018-02-27 DIAGNOSIS — Z7951 Long term (current) use of inhaled steroids: Secondary | ICD-10-CM | POA: Diagnosis not present

## 2018-02-27 DIAGNOSIS — J9622 Acute and chronic respiratory failure with hypercapnia: Secondary | ICD-10-CM | POA: Diagnosis not present

## 2018-02-27 DIAGNOSIS — Z9911 Dependence on respirator [ventilator] status: Secondary | ICD-10-CM | POA: Diagnosis not present

## 2018-02-27 DIAGNOSIS — L8990 Pressure ulcer of unspecified site, unspecified stage: Secondary | ICD-10-CM | POA: Diagnosis not present

## 2018-02-27 DIAGNOSIS — R0603 Acute respiratory distress: Secondary | ICD-10-CM | POA: Diagnosis present

## 2018-02-27 DIAGNOSIS — Z79899 Other long term (current) drug therapy: Secondary | ICD-10-CM | POA: Diagnosis not present

## 2018-02-27 DIAGNOSIS — J95811 Postprocedural pneumothorax: Secondary | ICD-10-CM | POA: Diagnosis not present

## 2018-02-27 DIAGNOSIS — F411 Generalized anxiety disorder: Secondary | ICD-10-CM | POA: Diagnosis not present

## 2018-02-27 DIAGNOSIS — T17990A Other foreign object in respiratory tract, part unspecified in causing asphyxiation, initial encounter: Secondary | ICD-10-CM | POA: Diagnosis not present

## 2018-02-27 DIAGNOSIS — Z66 Do not resuscitate: Secondary | ICD-10-CM | POA: Diagnosis not present

## 2018-02-27 DIAGNOSIS — J9601 Acute respiratory failure with hypoxia: Secondary | ICD-10-CM | POA: Diagnosis not present

## 2018-02-27 DIAGNOSIS — A419 Sepsis, unspecified organism: Secondary | ICD-10-CM | POA: Diagnosis not present

## 2018-02-27 DIAGNOSIS — Z515 Encounter for palliative care: Secondary | ICD-10-CM | POA: Diagnosis not present

## 2018-02-27 DIAGNOSIS — E8801 Alpha-1-antitrypsin deficiency: Secondary | ICD-10-CM | POA: Diagnosis not present

## 2018-02-27 DIAGNOSIS — E119 Type 2 diabetes mellitus without complications: Secondary | ICD-10-CM | POA: Diagnosis not present

## 2018-02-27 DIAGNOSIS — J440 Chronic obstructive pulmonary disease with acute lower respiratory infection: Secondary | ICD-10-CM | POA: Diagnosis not present

## 2018-02-27 DIAGNOSIS — D649 Anemia, unspecified: Secondary | ICD-10-CM | POA: Diagnosis not present

## 2018-02-27 DIAGNOSIS — E669 Obesity, unspecified: Secondary | ICD-10-CM | POA: Diagnosis not present

## 2018-02-27 DIAGNOSIS — I471 Supraventricular tachycardia: Secondary | ICD-10-CM | POA: Diagnosis not present

## 2018-02-27 DIAGNOSIS — I10 Essential (primary) hypertension: Secondary | ICD-10-CM | POA: Diagnosis not present

## 2018-02-27 DIAGNOSIS — M25571 Pain in right ankle and joints of right foot: Secondary | ICD-10-CM | POA: Diagnosis not present

## 2018-02-27 DIAGNOSIS — J95851 Ventilator associated pneumonia: Secondary | ICD-10-CM | POA: Diagnosis not present

## 2018-02-27 DIAGNOSIS — J9621 Acute and chronic respiratory failure with hypoxia: Secondary | ICD-10-CM | POA: Diagnosis not present

## 2018-02-27 DIAGNOSIS — J151 Pneumonia due to Pseudomonas: Secondary | ICD-10-CM | POA: Diagnosis not present

## 2018-02-27 DIAGNOSIS — I9589 Other hypotension: Secondary | ICD-10-CM | POA: Diagnosis not present

## 2018-02-27 DIAGNOSIS — E872 Acidosis: Secondary | ICD-10-CM | POA: Diagnosis not present

## 2018-02-27 LAB — BLOOD GAS, ARTERIAL
Acid-Base Excess: 7.1 mmol/L — ABNORMAL HIGH (ref 0.0–2.0)
Bicarbonate: 32.5 mmol/L — ABNORMAL HIGH (ref 20.0–28.0)
Drawn by: 249101
FIO2: 60
O2 Saturation: 89.2 %
PATIENT TEMPERATURE: 97.7
PCO2 ART: 57.2 mmHg — AB (ref 32.0–48.0)
PEEP: 10 cmH2O
RATE: 20 resp/min
VT: 490 mL
pH, Arterial: 7.369 (ref 7.350–7.450)
pO2, Arterial: 58.5 mmHg — ABNORMAL LOW (ref 83.0–108.0)

## 2018-02-27 LAB — GLUCOSE, CAPILLARY
GLUCOSE-CAPILLARY: 175 mg/dL — AB (ref 70–99)
GLUCOSE-CAPILLARY: 195 mg/dL — AB (ref 70–99)
GLUCOSE-CAPILLARY: 273 mg/dL — AB (ref 70–99)
Glucose-Capillary: 136 mg/dL — ABNORMAL HIGH (ref 70–99)
Glucose-Capillary: 170 mg/dL — ABNORMAL HIGH (ref 70–99)
Glucose-Capillary: 178 mg/dL — ABNORMAL HIGH (ref 70–99)
Glucose-Capillary: 232 mg/dL — ABNORMAL HIGH (ref 70–99)

## 2018-02-27 LAB — CULTURE, BLOOD (ROUTINE X 2)
Culture: NO GROWTH
Special Requests: ADEQUATE

## 2018-02-27 LAB — BASIC METABOLIC PANEL
ANION GAP: 7 (ref 5–15)
BUN: 54 mg/dL — ABNORMAL HIGH (ref 6–20)
CHLORIDE: 111 mmol/L (ref 98–111)
CO2: 31 mmol/L (ref 22–32)
CREATININE: 0.75 mg/dL (ref 0.44–1.00)
Calcium: 9.5 mg/dL (ref 8.9–10.3)
GFR calc non Af Amer: >60 mL/min (ref >60)
Glucose, Bld: 172 mg/dL — ABNORMAL HIGH (ref 70–99)
Potassium: 4.9 mmol/L (ref 3.5–5.1)
SODIUM: 149 mmol/L — AB (ref 135–145)

## 2018-02-27 LAB — CBC
HCT: 33.8 % — ABNORMAL LOW (ref 36.0–46.0)
HEMOGLOBIN: 9.4 g/dL — AB (ref 12.0–15.0)
MCH: 22.7 pg — ABNORMAL LOW (ref 26.0–34.0)
MCHC: 27.8 g/dL — ABNORMAL LOW (ref 30.0–36.0)
MCV: 81.4 fL (ref 80.0–100.0)
NRBC: 0.5 % — AB (ref 0.0–0.2)
Platelets: 202 10*3/uL (ref 150–400)
RBC: 4.15 MIL/uL (ref 3.87–5.11)
RDW: 17 % — ABNORMAL HIGH (ref 11.5–15.5)
WBC: 25.1 10*3/uL — AB (ref 4.0–10.5)

## 2018-02-27 MED ORDER — ACETAMINOPHEN 160 MG/5ML PO SOLN
650.0000 mg | Freq: Four times a day (QID) | ORAL | Status: DC | PRN
  Administered 2018-02-27 – 2018-02-28 (×3): 650 mg
  Filled 2018-02-27 (×3): qty 20.3

## 2018-02-27 MED ORDER — STERILE WATER FOR INJECTION IJ SOLN
INTRAMUSCULAR | Status: AC
  Filled 2018-02-27: qty 10

## 2018-02-27 MED ORDER — VECURONIUM BROMIDE 10 MG IV SOLR
10.0000 mg | Freq: Once | INTRAVENOUS | Status: AC
  Administered 2018-02-27: 10 mg via INTRAVENOUS

## 2018-02-27 MED ORDER — SODIUM CHLORIDE 0.9 % IV SOLN
0.0000 mg/h | INTRAVENOUS | Status: DC
  Administered 2018-02-27 – 2018-02-28 (×2): 2 mg/h via INTRAVENOUS
  Administered 2018-02-28: 4 mg/h via INTRAVENOUS
  Filled 2018-02-27 (×4): qty 5

## 2018-02-27 MED ORDER — VECURONIUM BROMIDE 10 MG IV SOLR
INTRAVENOUS | Status: AC
  Filled 2018-02-27: qty 10

## 2018-02-27 MED ORDER — TOBRAMYCIN 300 MG/5ML IN NEBU
300.0000 mg | INHALATION_SOLUTION | Freq: Two times a day (BID) | RESPIRATORY_TRACT | Status: DC
  Administered 2018-02-27: 300 mg via RESPIRATORY_TRACT
  Filled 2018-02-27 (×3): qty 5

## 2018-02-27 MED ORDER — VITAL AF 1.2 CAL PO LIQD
1000.0000 mL | ORAL | Status: DC
  Administered 2018-02-27 – 2018-02-28 (×2): 1000 mL

## 2018-02-27 NOTE — Progress Notes (Signed)
NAME:  Danielle Stevens, MRN:  [id number], DOB:  1971-01-04, LOS: 5 ADMISSION DATE:  02-24-2018,   Brief History   Danielle Stevens is a 47 y.o. 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  She was admitted 10/27 with multifocal PNA..  A1AT deficiency, COPD, chronic trach with vent dependence, HoTN, DM. Increasing respiratory distress 10/29: tracheostomy changed to distal XLT to relieve partially obstructing granulation tissue. R chest tube inserted for large R pneumothorax. Repeated episodes of high pressure and respiratory distress without obvious cause, possibly improved following bronchodilator or sedation. Tracheostomy remains in good position.  Subjective:  Work of breathing improved with additional sedation.  Copious brown secretions overnight, frequency of suctioning has decreased.  Objective    Examination: Vital Signs: sinus tachycardia, normotensive  HENT: Tracheostomy tube in place with no bleeding.  Heated wire circuit in place. Lungs: On PRVC ventilation. Better air entry bilaterally with bronchial breathing over RLL superior segment, no wheezing.  Cardiovascular: Extremities warm, no mottling, HS normal. Abdomen: PEG tube in place, BS rare. Soft. Extremities: No edema.  Neuro: Awake and talking around tracheostomy. GU: Clear urine in catheter. Chronic Urology placed Foley catheter.    Assessment & Plan:   Assessment Remains critically ill due acute on chronic respiratory failure requiring titration of mechanical ventilation. HCAP due to highly resistant Pseudomonas End stage COPD with increased dead space ventilation and copious secretions causing ongoing episodes of respiratory distress.   Complicated by pneumothorax.  Hypotension from increased intrathoracic pressure resolved. Ongoing treatment for recent PE  Plan Continue Full ventilatory support with lung protective strategy. Heated wire  to reduce resistance. Keep sedated for now, until secretions decrease. May be long time given poor baseline lung function. Chest PT. Switched antibiotics to Zerbaxa and inhaled tobramycin for double coverage.  Will resume the tobi-nebs as more settled now and actual sensitivity to Zybaxa is unknown Consider switch to IV tobramycin if leukocytosis worsens. No plan to reinsert chest tube as tiny residual pneumothorax Continue multimodal pain control: oxycodone and NSAID prn No procedures anticipated - resumed eliquis   Best Practices (discussed on multidisciplinary rounds)  Nutritional status and diet: Tolerating feeds at goal. BM this morning. Pain/Anxiety/Delirium reduction: Stopped iv meds VAP protocol (if indicated) in place DVT prophylaxis: eliquis GI prophylaxis: Famotidine Hyperglycemia protocol: control acceptable - add basal Lantus.to moderate SSI Mobility: bed-bound chronically Antibiotic de-escalation: tobramycin and ceftolozane - tazobactam  Code Status: Full Family Communication: have discussed condition with daughter over the phone 10/28.  Understands that patient may require eventual limits on care.  Labs and Ancillary Testing (personally reviewed)  CBC: Leucocytosis increased to 25.1, mild anemia 9.4 (microcytic, hypochromic)  Chemistry: mild hypernatremia at 149.  ABG: improving respiratory acidosis  Coagulation Profile: heparin assay therapeutic.  Cardiac Enzymes: N/A  CBG: improved to 170-180 range.  Microbiology: few pseudomonas in tracheal aspirate. Resistant to all antibiotics except gentamicin.  CXR 11/1: small residual basilar pneumothorax. R mid lung field  infiltrate.  CRITICAL CARE Performed by: Lynnell Catalan   Total critical care time: 40 minutes  Critical care time was exclusive of separately billable procedures and treating other patients.  Critical care was necessary to treat or prevent imminent or life-threatening deterioration.  Critical  care was time spent personally by me on the following activities: development of treatment plan with patient and/or surrogate as well as nursing, discussions with consultants, evaluation of patient's response to treatment, examination of  patient, obtaining history from patient or surrogate, ordering and performing treatments and interventions, ordering and review of laboratory studies, ordering and review of radiographic studies, pulse oximetry, re-evaluation of patient's condition, and participation in multidisciplinary rounds.   Lynnell Catalan, MD [facility] ICU Physician [facility] [place] Critical Care  Pager: [phone] Mobile: [phone] After hours: [phone].

## 2018-02-27 NOTE — Care Management Note (Signed)
Case Management Note  Patient Details  Name: Danielle Stevens MRN: [id number] Date of Birth: 10-26-1970  Subjective/Objective:   Pt admitted with multifocal PNA                   Action/Plan:  PTA from [facility] - pt is chronic trach/vent.  CSW consulted.   Expected Discharge Date:                  Expected Discharge Plan:  Skilled Nursing Facility  In-House Referral:  Clinical Social Work  Discharge planning Services  CM Consult  Post Acute Care Choice:    Choice offered to:     DME Arranged:    DME Agency:     HH Arranged:    HH Agency:     Status of Service:     If discussed at [employer], dates discussed:    Additional Comments: 02/27/2018 Pt currently on high vent settings with copious secretion. Cherylann Parr, RN 02/27/2018, 1:00 PM

## 2018-02-27 NOTE — Progress Notes (Signed)
Upon giving patient bath this AM, this RN discovered patient on bedpan. Unsure of length of time patient was on bedpan. Patient was not turned during shift due to increased agitation with movement. Bed was placed on rotation. DTI present on R buttock and mid sacral. Foam dressing placed on patient. Patient oxygen decreased to mid 80's during bath and HR increased to 130's-140's. Bath had to be paused and this RN reassessed patient. R lung sounds sound more distant and diminished than earlier in shift. MD called. Chest xray ordered. Patient suctioned and given 100% O2 breath. Bath restarted and completed 30 min later.   Genelle Bal, RN

## 2018-02-27 NOTE — Progress Notes (Signed)
Fentanyl drip 75cc wasted.  Witnessed by Bascom Levels, RN.

## 2018-02-27 NOTE — Progress Notes (Signed)
Pt with inc WOB , decreasing O2 sats to 83%, inc HR and diminished breath sounds throughout.  Breath sounds had previously just been diminished but audible on left.  Sedation at maximum ordered.  Trach suctioned.  RT in to assess pt.  Dr. Denese Killings was notified and came to assess pt at bedside.  Vecuronium 10mg  given per order with immediate improvement of sats and breath sounds.  Dr. Denese Killings was notified of improvement.  Will monitor.

## 2018-02-27 NOTE — Progress Notes (Signed)
Family Medicine Teaching Service is following and appreciates the great care of the Critical Care Medicine Team. We will continue to follow.  Peggyann Shoals, DO [facility] Family Medicine, PGY-1 02/27/2018 7:44 AM

## 2018-02-27 NOTE — Progress Notes (Signed)
Nutrition Follow-up  DOCUMENTATION CODES:   Not applicable  INTERVENTION:    Change TF to Vital AF 1.2 at 55 ml/h (1320 ml per day) via PEG  Provides 1584 kcal, 99 gm protein, 1071 ml free water daily  NUTRITION DIAGNOSIS:   Inadequate oral intake related to inability to eat as evidenced by NPO status.  Ongoing  GOAL:   Patient will meet greater than or equal to 90% of their needs  Met with TF  MONITOR:   Vent status, TF tolerance, Labs  ASSESSMENT:   47 yo female with PMH of COPD, DM, HTN, alpha-1 antitrypsin deficiency, trach, chronic vent dependence, PEG, and anxiety who was admitted on 10/27 with worsening SOB r/t PNA.  Patient is currently receiving Vital High Protein via PEG at 50 ml/h (1200 ml/day) to provide 1200 kcals, 105 gm protein, 1003 ml free water daily.  Now that propofol is off, TF is not meeting kcal needs.  Patient is currently intubated on ventilator support MV: 7 L/min Temp (24hrs), Avg:98.8 F (37.1 C), Min:97.7 F (36.5 C), Max:101.1 F (38.4 C)  Propofol: none   Labs reviewed. Sodium 149 (H), BUN 54 (H) CBG's: 136-170 Medications reviewed and include Colace, Novolog, Lantus, Solu-medrol, MVI, Senokot, Levophed.  Weight stable since admission I/O +5.1 L since admission  Diet Order:   Diet Order            Diet NPO time specified  Diet effective now              EDUCATION NEEDS:   No education needs have been identified at this time  Skin:  Skin Assessment: Reviewed RN Assessment(MASD to buttocks)  Last BM:  11/1  Height:   Ht Readings from Last 1 Encounters:  02/23/18 '5\' 4"'$  (1.626 m)    Weight:   Wt Readings from Last 1 Encounters:  02/27/18 63.8 kg    Ideal Body Weight:  54.5 kg  BMI:  Body mass index is 24.14 kg/m.  Estimated Nutritional Needs:   Kcal:  1620  Protein:  95-105 gm  Fluid:  1.6-1.8 L    Molli Barrows, RD, LDN, [place] Pager [phone] After Hours Pager [phone]

## 2018-02-27 DEATH — deceased

## 2018-02-28 ENCOUNTER — Inpatient Hospital Stay (HOSPITAL_COMMUNITY): Payer: Medicare Other

## 2018-02-28 DIAGNOSIS — R0603 Acute respiratory distress: Secondary | ICD-10-CM | POA: Diagnosis not present

## 2018-02-28 DIAGNOSIS — A419 Sepsis, unspecified organism: Secondary | ICD-10-CM | POA: Diagnosis not present

## 2018-02-28 DIAGNOSIS — J9601 Acute respiratory failure with hypoxia: Secondary | ICD-10-CM | POA: Diagnosis not present

## 2018-02-28 LAB — GLUCOSE, CAPILLARY
GLUCOSE-CAPILLARY: 173 mg/dL — AB (ref 70–99)
GLUCOSE-CAPILLARY: 225 mg/dL — AB (ref 70–99)
GLUCOSE-CAPILLARY: 151 mg/dL — AB (ref 70–99)
Glucose-Capillary: 144 mg/dL — ABNORMAL HIGH (ref 70–99)
Glucose-Capillary: 145 mg/dL — ABNORMAL HIGH (ref 70–99)
Glucose-Capillary: 215 mg/dL — ABNORMAL HIGH (ref 70–99)

## 2018-02-28 LAB — CBC
HEMATOCRIT: 33.1 % — AB (ref 36.0–46.0)
Hemoglobin: 8.6 g/dL — ABNORMAL LOW (ref 12.0–15.0)
MCH: 22 pg — ABNORMAL LOW (ref 26.0–34.0)
MCHC: 26 g/dL — ABNORMAL LOW (ref 30.0–36.0)
MCV: 84.7 fL (ref 80.0–100.0)
NRBC: 0.4 % — AB (ref 0.0–0.2)
PLATELETS: 201 10*3/uL (ref 150–400)
RBC: 3.91 MIL/uL (ref 3.87–5.11)
RDW: 17.1 % — AB (ref 11.5–15.5)
WBC: 25.6 10*3/uL — AB (ref 4.0–10.5)

## 2018-02-28 LAB — TOBRAMYCIN LEVEL, RANDOM: TOBRAMYCIN RM: 6.2 ug/mL

## 2018-02-28 MED ORDER — TOBRAMYCIN SULFATE 80 MG/2ML IJ SOLN
7.0000 mg/kg | Freq: Once | INTRAVENOUS | Status: AC
  Administered 2018-02-28: 450 mg via INTRAVENOUS
  Filled 2018-02-28: qty 11.25

## 2018-02-28 MED ORDER — TOBRAMYCIN SULFATE 80 MG/2ML IJ SOLN
450.0000 mg | INTRAVENOUS | Status: DC
  Filled 2018-02-28: qty 11.25

## 2018-02-28 MED ORDER — TOBRAMYCIN SULFATE 80 MG/2ML IJ SOLN: 5.0000 mg/kg | INTRAVENOUS | Status: DC

## 2018-02-28 NOTE — Progress Notes (Signed)
FPTS continues to follow along with course of this patient.  Appreciate excellent care by PCCM.

## 2018-02-28 NOTE — Progress Notes (Signed)
Pharmacy Antibiotic Note  Danielle Stevens is a 47 y.o. female admitted on 02/18/2018 with MDR Pseudomonas PNA. Initial treatment with Zerbaxa and inhaled tobramycin, but unable to tolerate the nebulized solution. Patient with minimal improvement in respiratory status, temp to 101.6 this AM, and increasing WBC to 25.6. Pharmacy has now been consulted for tobramycin dosing. Scr 0.75, estimated CrCl ~74 mL/min. Will use extended interval dosing and therefore TBW.  Plan: Tobramycin 450mg  (~7mg /kg TBW) IV x1 Will get tobramycin level 6-14 hours after infusion started Dosing frequency to be determined by tobramycin level Continue Zerbaxa 3g IV q8h F/u clinical status, renal function, tobramycin levels as indicated  Height: 5\' 4"  (162.6 cm) Weight: 142 lb 13.7 oz (64.8 kg) IBW/kg (Calculated) : 54.7  Temp (24hrs), Avg:100.1 F (37.8 C), Min:98.3 F (36.8 C), Max:101.6 F (38.7 C)  Recent Labs  Lab 02/10/2018 1609 02/17/2018 1616 02/23/18 0410 02/24/18 0339 02/25/18 0359 02/25/18 0650 02/26/18 0433 02/27/18 0420 02/28/18 0444  WBC 32.0*  --  24.7* 22.4* 13.0*  --  16.5* 25.1* 25.6*  CREATININE 0.90  --  0.68 0.89  --  0.56  --  0.75  --   LATICACIDVEN  --  1.44  --   --   --   --   --   --   --     Estimated Creatinine Clearance: 73.5 mL/min (by C-G formula based on SCr of 0.75 mg/dL).    Allergies  Allergen Reactions  . Wasp Venom Protein Shortness Of Breath  . Adhesive [Tape] Other (See Comments)    Bruises   . Coconut Oil Hives  . Latex Other (See Comments)    Bruises   . Robitussin Severe Multi-Symp [Phenylephrine-Dm-Gg-Apap] Diarrhea and Other (See Comments)    Allergic, per verbal MAR  . Shellfish-Derived Products Hives and Other (See Comments)    Allergic, per verbal MAR   Antimicrobials this admission: 10/27 vanc >> 10/28 10/27 cefepime >> 10/28 10/28 meropenem >> 10/30 10/30 inhaled tobramycin  >> 10/31 (d/c for bronchospasm) 10/30 Zerbaxa >>  11/2 IV tobramycin  >>  Microbiology results: 10/27 resp: few Pseudomonas aeruginosa (R to imipenem) 10/27 BCx: NG final  Thank you for allowing pharmacy to be a part of this patient's care.  Roderic Scarce Zigmund Daniel, PharmD PGY2 Infectious Diseases Pharmacy Resident Phone: [phone] 02/28/2018 11:46 AM

## 2018-02-28 NOTE — Progress Notes (Signed)
Pharmacy Antibiotic Note  Danielle Stevens is a 47 y.o. female being treated for Pseudomonas pneumonia.  Pharmacy has been consulted for tobramycin dosing.  8-hour tobra concentration = 6.2 mcg/ml, which falls on the border between Q24H and Q36H dosing; given that this is the first dose, would assume that continued dosing would push concentration higher.  Plan: Continue tobramycin at 450mg  IV Q36H.  Height: 5\' 4"  (162.6 cm) Weight: 142 lb 13.7 oz (64.8 kg) IBW/kg (Calculated) : 54.7  Temp (24hrs), Avg:99.8 F (37.7 C), Min:98.5 F (36.9 C), Max:101.6 F (38.7 C)  Recent Labs  Lab 01/27/2018 1609 02/20/2018 1616 02/23/18 0410 02/24/18 0339 02/25/18 0359 02/25/18 0650 02/26/18 0433 02/27/18 0420 02/28/18 0444 02/28/18 2221  WBC 32.0*  --  24.7* 22.4* 13.0*  --  16.5* 25.1* 25.6*  --   CREATININE 0.90  --  0.68 0.89  --  0.56  --  0.75  --   --   LATICACIDVEN  --  1.44  --   --   --   --   --   --   --   --   TOBRARND  --   --   --   --   --   --   --   --   --  6.2    Estimated Creatinine Clearance: 73.5 mL/min (by C-G formula based on SCr of 0.75 mg/dL).    Allergies  Allergen Reactions  . Wasp Venom Protein Shortness Of Breath  . Adhesive [Tape] Other (See Comments)    Bruises   . Coconut Oil Hives  . Latex Other (See Comments)    Bruises   . Robitussin Severe Multi-Symp [Phenylephrine-Dm-Gg-Apap] Diarrhea and Other (See Comments)    Allergic, per verbal MAR  . Shellfish-Derived Products Hives and Other (See Comments)    Allergic, per verbal MAR    Thank you for allowing pharmacy to be a part of this patient's care.  Vernard Gambles, PharmD, BCPS  02/28/2018 11:57 PM

## 2018-02-28 NOTE — Progress Notes (Signed)
Sedation was decreased at shift change. Patient at 2200 responding to voice and following commands. At 2240 patient became very anxious and sat straight up in the bed. Pt became tachypnic and tachy and stated she could not breathe. RN increased sedation and gave PRN bolus. Patient now back at a RASS goal of -3. VS stable

## 2018-02-28 NOTE — Progress Notes (Signed)
NAME:  Danielle Stevens, MRN:  [id number], DOB:  07/25/1970, LOS: 6 ADMISSION DATE:  03/06/18,   Brief History   Danielle Stevens is a 47 y.o. 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  She was admitted 10/27 with multifocal PNA..  A1AT deficiency, COPD, chronic trach with vent dependence, HoTN, DM. Increasing respiratory distress 10/29: tracheostomy changed to distal XLT to relieve partially obstructing granulation tissue. R chest tube inserted for large R pneumothorax. Repeated episodes of high pressure and respiratory distress without obvious cause, possibly improved following bronchodilator or sedation. Tracheostomy remains in good position.  Subjective:  Work of breathing increased yesterday following nebulized tobramycin which appears to have worsened bronchospasm.  Nevertheless, she has required significant sedation overnight to prevent ventilator dyssynchrony.   Objective    Examination: Vital Signs: sinus tachycardia, normotensive  HENT: Tracheostomy tube in place with no bleeding.  Heated wire circuit in place. Lungs: On PRVC ventilation. Better air entry bilaterally with bronchial breathing over RLL superior segment, no wheezing. Air entry remains poor. Pplat 18.  Cardiovascular: Extremities warm, no mottling, HS normal. Abdomen: PEG tube in place, BS rare. Soft. Extremities: No edema.  Neuro: somnolent but follows commands. GU: Clear urine in catheter. Chronic Urology placed Foley catheter.    Assessment & Plan:   Assessment Remains critically ill due acute on chronic respiratory failure requiring titration of mechanical ventilation.  Concerning that patient has not recovered to prior baseline overnight. HCAP due to highly resistant Pseudomonas End stage COPD with increased dead space ventilation, copious secretions have improved Complicated by pneumothorax - now resolved. Hypotension from  increased intrathoracic pressure resolved. Ongoing treatment for recent PE  Plan Continue Full ventilatory support with lung protective strategy. Heated wire to reduce resistance. Keep sedated for now,  May be long time given poor baseline lung function. Chest PT. Switch to iv tobramycin given increasing leukocytosis. Switched antibiotics to Zerbaxa and inhaled tobramycin for double coverage.  Will resume the tobi-nebs as more settled now and actual sensitivity to Zybaxa is unknown Consider switch to IV tobramycin if leukocytosis worsens. Repeat CXR today to reassess pneumothorax Continue multimodal pain control: oxycodone and NSAID prn No procedures anticipated - resumed eliquis   Best Practices (discussed on multidisciplinary rounds)  Nutritional status and diet: Tolerating feeds at goal.  Pain/Anxiety/Delirium reduction: on dilaudid and midazolam for RASS -3 VAP protocol (if indicated) in place DVT prophylaxis: eliquis GI prophylaxis: Famotidine Hyperglycemia protocol: control acceptable - add basal Lantus.to moderate SSI Mobility: bed-bound chronically Antibiotic de-escalation: tobramycin  and ceftolozane - tazobactam  Code Status: Full Family Communication: have discussed condition with daughter over the phone 10/28.  Understands that patient may require eventual limits on care.  Labs and Ancillary Testing (personally reviewed)  CBC: Leucocytosis remains elevate to 25.6, mild anemia 9.4 (microcytic, hypochromic)  Chemistry: mild hypernatremia at 149.  ABG: improving respiratory acidosis  Coagulation Profile: heparin assay therapeutic.  Cardiac Enzymes: N/A  CBG: improved to 170-180 range.  Microbiology: few pseudomonas in tracheal aspirate. Resistant to all antibiotics except gentamicin.  CXR 11/1: small residual basilar pneumothorax. R mid lung field  infiltrate.  CRITICAL CARE Performed by: Lynnell Catalan   Total critical care time: 35 minutes  Critical care  time was exclusive of separately billable procedures and treating other patients.  Critical care was necessary to treat or prevent imminent or life-threatening deterioration.  Critical care was time spent personally by me on  the following activities: development of treatment plan with patient and/or surrogate as well as nursing, discussions with consultants, evaluation of patient's response to treatment, examination of patient, obtaining history from patient or surrogate, ordering and performing treatments and interventions, ordering and review of laboratory studies, ordering and review of radiographic studies, pulse oximetry, re-evaluation of patient's condition, and participation in multidisciplinary rounds.   Lynnell Catalan, MD [facility] ICU Physician [facility] [place] Critical Care  Pager: [phone] Mobile: [phone] After hours: [phone].

## 2018-03-01 DIAGNOSIS — R0603 Acute respiratory distress: Secondary | ICD-10-CM | POA: Diagnosis not present

## 2018-03-01 DIAGNOSIS — J9601 Acute respiratory failure with hypoxia: Secondary | ICD-10-CM | POA: Diagnosis not present

## 2018-03-01 DIAGNOSIS — A419 Sepsis, unspecified organism: Secondary | ICD-10-CM | POA: Diagnosis not present

## 2018-03-01 LAB — POCT I-STAT 3, ART BLOOD GAS (G3+)
ACID-BASE EXCESS: 7 mmol/L — AB (ref 0.0–2.0)
BICARBONATE: 35.1 mmol/L — AB (ref 20.0–28.0)
O2 SAT: 86 %
PO2 ART: 56 mmHg — AB (ref 83.0–108.0)
Patient temperature: 96.6
TCO2: 37 mmol/L — ABNORMAL HIGH (ref 22–32)
pCO2 arterial: 72.2 mmHg (ref 32.0–48.0)
pH, Arterial: 7.289 — ABNORMAL LOW (ref 7.350–7.450)

## 2018-03-01 LAB — BASIC METABOLIC PANEL
ANION GAP: 4 — AB (ref 5–15)
BUN: 39 mg/dL — AB (ref 6–20)
CHLORIDE: 114 mmol/L — AB (ref 98–111)
CO2: 36 mmol/L — AB (ref 22–32)
Calcium: 8.8 mg/dL — ABNORMAL LOW (ref 8.9–10.3)
Creatinine, Ser: 0.63 mg/dL (ref 0.44–1.00)
GFR calc Af Amer: >60 mL/min (ref >60)
GLUCOSE: 179 mg/dL — AB (ref 70–99)
POTASSIUM: 4.4 mmol/L (ref 3.5–5.1)
Sodium: 154 mmol/L — ABNORMAL HIGH (ref 135–145)

## 2018-03-01 LAB — CBC WITH DIFFERENTIAL/PLATELET
Abs Immature Granulocytes: 1.82 10*3/uL — ABNORMAL HIGH (ref 0.00–0.07)
BASOS ABS: 0.1 10*3/uL (ref 0.0–0.1)
Basophils Relative: 0 %
EOS ABS: 0.2 10*3/uL (ref 0.0–0.5)
EOS PCT: 1 %
HEMATOCRIT: 36.1 % (ref 36.0–46.0)
HEMOGLOBIN: 9.2 g/dL — AB (ref 12.0–15.0)
Immature Granulocytes: 7 %
LYMPHS ABS: 1.5 10*3/uL (ref 0.7–4.0)
Lymphocytes Relative: 6 %
MCH: 22 pg — AB (ref 26.0–34.0)
MCHC: 25.5 g/dL — ABNORMAL LOW (ref 30.0–36.0)
MCV: 86.4 fL (ref 80.0–100.0)
MONO ABS: 1 10*3/uL (ref 0.1–1.0)
MONOS PCT: 4 %
NEUTROS PCT: 82 %
NRBC: 0.5 % — AB (ref 0.0–0.2)
Neutro Abs: 20.6 10*3/uL — ABNORMAL HIGH (ref 1.7–7.7)
Platelets: 233 10*3/uL (ref 150–400)
RBC: 4.18 MIL/uL (ref 3.87–5.11)
RDW: 17 % — ABNORMAL HIGH (ref 11.5–15.5)
WBC: 25.3 10*3/uL — ABNORMAL HIGH (ref 4.0–10.5)

## 2018-03-01 LAB — GLUCOSE, CAPILLARY
GLUCOSE-CAPILLARY: 134 mg/dL — AB (ref 70–99)
GLUCOSE-CAPILLARY: 156 mg/dL — AB (ref 70–99)
Glucose-Capillary: 155 mg/dL — ABNORMAL HIGH (ref 70–99)

## 2018-03-02 LAB — CULTURE, RESPIRATORY W GRAM STAIN

## 2018-03-02 LAB — CULTURE, RESPIRATORY

## 2018-03-05 LAB — CULTURE, RESPIRATORY

## 2018-03-05 LAB — CULTURE, RESPIRATORY W GRAM STAIN

## 2018-03-05 LAB — MISCELLANEOUS TEST

## 2018-03-29 NOTE — Progress Notes (Signed)
   NAME:  Brooke-Lynn Trust, MRN:  [id number], DOB:  [birth year], LOS: 7 ADMISSION DATE:  2018/03/22,   Brief History   Teya Spagnoli is a 47 y.o. 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  She was admitted 10/27 with multifocal PNA..  A1AT deficiency, COPD, chronic trach with vent dependence, HoTN, DM. Increasing respiratory distress 10/29: tracheostomy changed to distal XLT to relieve partially obstructing granulation tissue. R chest tube inserted for large R pneumothorax. Repeated episodes of high pressure and respiratory distress without obvious cause, possibly improved following bronchodilator or sedation. Tracheostomy remains in good position.  Subjective:  Continued to have increasing work of breathing and tachycardia in spite of increasing sedation. Donnita Falls, RN has spoken to the patient's daughter who has opted for comfort care at this time. Because of ongoing agonal breathing midazolam and Dilaudid were increased.   Objective    Examination: Vital Signs: Absent  HENT: Tracheostomy tube in place with no bleeding.  Lungs: No respiratory effort  Cardiovascular: Extremities cool and pale. No pulses   Assessment & Plan:   Assessment critically ill due acute on chronic respiratory failure requiring titration of mechanical ventilation with ongoing worsening despite full ventilatory support likely due to resistant Pseudomonas. Has passed away on comfort care.  Plan Patient pronounced at 1530  Lynnell Catalan, MD [facility] ICU Physician [facility] Critical Care  Pager: [phone] Mobile: [phone] After hours: [phone].

## 2018-03-29 NOTE — Progress Notes (Signed)
CPT held at this time due to pt HR being >140

## 2018-03-29 NOTE — Progress Notes (Signed)
FPTS Interim Progress Note  FPTS continues to follow along with course of this patient. Appreciate excellent care by PCCM.   Danielle Manis, DO 03/25/2018, 6:57 AM PGY-2, [facility] pager [phone]

## 2018-03-29 NOTE — Progress Notes (Signed)
Daughter of patient verbalized to this RN and Paulino Rily that she wants mother to be DNR and to go to comfort care after change in patient's status.

## 2018-03-29 NOTE — Progress Notes (Signed)
Verbal order to up Dilaudid to 8 mg an hour and give 3 mg bolus  Verbal order for 10 mg versed and 5 mg bolus  By Dr. Denese Killings.

## 2018-03-29 NOTE — Death Summary Note (Signed)
DEATH SUMMARY   Patient Details  Name: Danielle Stevens MRN: [id number] DOB: November 12, 1970  Admission/Discharge Information   Admit Date:  03-15-18  Date of Death:    Time of Death:    Length of Stay: 7  Referring Physician: Patient, No Pcp Per   Reason(s) for Hospitalization  Acute on chronic respiratory failure  Diagnoses  Preliminary cause of death:  Secondary Diagnoses (including complications and co-morbidities):  Principal Problem:   Pseudomonas pneumonia (HCC) Active Problems:   Sepsis (HCC)   Pressure injury of skin   Brief Hospital Course (including significant findings, care, treatment, and services provided and events leading to death)  Danielle Stevens is a 47 y.o. 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 She was residing at a SNF on chronic ventilation. She was admitted for increasing respiratory failure and was found to have a severe R sided pneumonia due to a resistant Pseudomonas. She failed to improve despite mechanical ventilation, dual antibiotics and chest PT.  She was becoming increasingly distress and required higher doses of sedation to maintain comfort. The situation was discussed with her daughter on several occasions and she agreed to make her mother comfort care given her ongoing decline and distress.  She passed away at 1530. No autopsy. CDS will be notified.    Pertinent Labs and Studies  Significant Diagnostic Studies Dg Chest Port 1 View  Result Date: 02/28/2018 CLINICAL DATA:  47 year old female with pneumonia EXAM: PORTABLE CHEST 1 VIEW COMPARISON:  02/27/2018, 02/26/2018 FINDINGS: Cardiomediastinal silhouette unchanged. Similar appearance of bilateral right greater than left airspace and interstitial opacities. Pneumothorax at the right lung base is relatively unchanged. Right upper extremity PICC terminates in the superior vena cava. Unchanged endotracheal tube, terminating  approximately 6.5 cm above the carina. Left greater than right pleural effusion. IMPRESSION: Unchanged appearance of small right basilar pneumothorax. Persisting mixed interstitial and airspace opacities worst on the right. Persisting small left greater than right pleural effusion. Unchanged endotracheal tube and right upper extremity PICC. Electronically Signed   By: Gilmer Mor D.O.   On: 02/28/2018 15:32   Dg Chest Port 1 View  Result Date: 02/27/2018 CLINICAL DATA:  47 year old female with desaturation. EXAM: PORTABLE CHEST 1 VIEW COMPARISON:  Chest radiograph dated 02/26/2018 FINDINGS: Right-sided PICC and tracheostomy in similar position. Background of emphysema and large area of opacity in the right lung appears similar to prior radiograph. The previously described pleural line is again noted but only partially visualized. The costophrenic angles have been excluded from the image. Stable cardiac silhouette. No acute osseous pathology. IMPRESSION: No significant interval change. Electronically Signed   By: Elgie Collard M.D.   On: 02/27/2018 04:11   Dg Chest Port 1 View  Result Date: 02/26/2018 CLINICAL DATA:  Shortness of breath EXAM: PORTABLE CHEST 1 VIEW COMPARISON:  Films from earlier in the same day FINDINGS: Right-sided chest tube has been removed in the interval. Right-sided PICC line and tracheostomy tube are again noted and stable. The lungs demonstrate stable changes bilaterally. Pleural line is noted at the base suggestive of basilar pneumothorax. This was not well appreciated on the prior exam. IMPRESSION: Small right basilar pneumothorax. The remainder of the study is stable aside from interval chest tube removal. Electronically Signed   By: Alcide Clever M.D.   On: 02/26/2018 10:11   Dg Chest Port 1 View  Result Date: 02/26/2018 CLINICAL DATA:  Shortness of breath EXAM: PORTABLE CHEST 1 VIEW COMPARISON:  02/25/2018 FINDINGS: Cardiac shadow is stable. Hyperinflation consistent  with COPD is noted. Tracheostomy tube and right-sided PICC line are again noted and stable. The right-sided chest tube has withdrawn slightly in the interval from the prior exam. No definitive pneumothorax is noted persistent consolidation in the right upper lobe is noted emanating from the right hilum. Stable infiltrate is again seen in the left upper lobe. No sizable effusion is seen. IMPRESSION: No pneumothorax is noted. No significant interval change from the prior exam is seen aside from slight withdrawal of the right-sided chest tube. Electronically Signed   By: Alcide Clever M.D.   On: 02/26/2018 07:13   Dg Chest Port 1 View  Result Date: 02/25/2018 CLINICAL DATA:  Pneumothorax EXAM: PORTABLE CHEST 1 VIEW COMPARISON:  February 24, 2018 FINDINGS: Chest tube remains on the right, unchanged in position. The previously noted pneumothorax in the right base is not appreciable currently. No pneumothorax is evident currently. Tracheostomy catheter tip is 8.9 cm above the carina. Central catheter tip is in the superior vena cava. There is extensive airspace consolidation in the right upper and mid lung regions, stable. There is also infiltrate in the left upper lobe, stable. There is a small right pleural effusion. There is patchy bibasilar atelectasis. No new opacity is evident. Heart size and pulmonary vascularity are normal. There is no demonstrable adenopathy by radiography. No bone lesions. IMPRESSION: Currently no pneumothorax evident. Chest tube remains on the right, unchanged in position. Other tube and catheter positions are noted. Extensive consolidation on the right with lesser consolidation left upper lobe noted. Bibasilar atelectasis with small pleural effusion noted on the right. Stable cardiac silhouette. Electronically Signed   By: Bretta Bang III M.D.   On: 02/25/2018 10:01   Dg Chest Port 1 View  Result Date: 02/24/2018 CLINICAL DATA:  Respiratory failure, shortness of breath EXAM:  PORTABLE CHEST 1 VIEW COMPARISON:  02/23/2018 FINDINGS: Right pleural pigtail catheter remains in place. There is a right pneumothorax best seen at the right lateral lung base. Right perihilar consolidation and left upper lobe airspace disease again noted, unchanged. Severe hyperinflation. Heart is normal size. Right PICC line and tracheostomy are unchanged. IMPRESSION: Small right basilar pneumothorax. Stable bilateral airspace disease, right greater than left. Severe hyperinflation. Electronically Signed   By: Charlett Nose M.D.   On: 02/24/2018 07:57   Dg Chest Port 1 View  Result Date: 02/23/2018 CLINICAL DATA:  Chest tube adjustment.  Pneumothorax EXAM: PORTABLE CHEST 1 VIEW COMPARISON:  02/23/2018 FINDINGS: Pigtail chest tube in the right lung base has been position more laterally. Interval removal of the majority of the right pneumothorax with minimal residual pleural air in the right base. Small right effusion Severe consolidation in the right perihilar lung appears unchanged. Mild left upper lobe airspace disease unchanged. Severe pulmonary hyperinflation Endotracheal tube in good position. Right arm PICC tip in the proximal SVC IMPRESSION: Near complete resolution of right pneumothorax. Extensive airspace disease right greater than left unchanged. Electronically Signed   By: Marlan Palau M.D.   On: 02/23/2018 17:23   Dg Chest Port 1 View  Result Date: 02/23/2018 CLINICAL DATA:  Chest tube placement. EXAM: PORTABLE CHEST 1 VIEW COMPARISON:  Radiographs of February 23, 2018. FINDINGS: Stable cardiac size. Tracheostomy tube is unchanged in position. Right-sided PICC line is unchanged. Interval placement of pigtail catheter into right lung base for treatment of pneumothorax. Moderate to large pneumothorax is again noted, most prominently seen in basilar region. Stable large airspace opacity is  noted in right upper lobe concerning for pneumonia. Stable left upper lobe opacity is noted as well.  IMPRESSION: Interval placement of right-sided chest tube. Moderate to large size right pneumothorax remains. Bilateral upper lobe airspace opacities are also noted, right greater than left. Electronically Signed   By: Lupita Raider, M.D.   On: 02/23/2018 17:09   Dg Chest Port 1 View  Result Date: 02/23/2018 CLINICAL DATA:  Asthma-COPD, alpha 1 antitrypsin deficiency, diabetes, sepsis. Tracheostomy tube present. Status post bronchoscopy and tracheostomy tube change out. EXAM: PORTABLE CHEST 1 VIEW COMPARISON:  Portable chest x-ray of February 23, 2018 FINDINGS: The lungs are hyperinflated. There is a new right-sided pneumothorax which likely occupies at least 30% of the right hemithorax. There is no mediastinal shift. The left lung remains hyperinflated. Patchy airspace opacity in the inferior aspect of the right upper lobe is stable. Similar patchy increased density peripherally in the left upper lobe is present. The heart and pulmonary vascularity are normal. The tracheostomy tube tip projects at the inferior margin of the clavicular heads. The right-sided PICC line tip projects over the midportion of the SVC. IMPRESSION: New approximately 30-40% right-sided pneumothorax since bronchoscopy and tracheostomy tube change out. No mediastinal shift. Stable dense parenchymal consolidation in the right mid upper lung. Stable patchy increased density in the left mid upper lung. These results were called by telephone at the time of interpretation on 02/23/2018 at 2:18 pm to Ms. Deadamo, RN, who verbally acknowledged these results. Electronically Signed   By: David  [place] M.D.   On: 02/23/2018 14:20   Dg Chest Port 1 View  Result Date: 02/23/2018 CLINICAL DATA:  47 year old female admitted with worsening shortness of breath. Multifocal pneumonia. History of pulmonary Pseudomonas. EXAM: PORTABLE CHEST 1 VIEW COMPARISON:  02/23/2018 and earlier. FINDINGS: Portable AP upright view at 1006 hours. Partially visible  tracheostomy tube. Stable right PICC line. Large lung volumes with bullous emphysema and architectural distortion in the left lung. Large area of consolidation in the right upper lobe persists and remains substantially progressed compared to 02/16/2018. No superimposed pneumothorax or pleural effusion. Stable ventilation elsewhere. Stable cardiac size and mediastinal contours. IMPRESSION: 1. Right upper lobe pneumonia without significant change. 2. Underlying severe chronic lung disease. 3. No new cardiopulmonary abnormality. Electronically Signed   By: Odessa Fleming M.D.   On: 02/23/2018 10:46   Dg Chest Port 1 View  Result Date: 02/23/2018 CLINICAL DATA:  47 year old female with respiratory distress. History of COPD. EXAM: PORTABLE CHEST 1 VIEW COMPARISON:  Chest radiograph dated 02/10/2018 FINDINGS: Tracheostomy in stable position. There is background of emphysema and hyperinflation consistent with air trapping. Left upper lobe streaky density similar to prior radiograph. There has been interval increase in the consolidative changes of the right upper lobe compared to the earlier radiograph consistent with worsening pneumonia. Clinical correlation and follow-up to resolution is recommended to exclude an underlying mass. Right-sided PICC in similar position. Stable cardiac silhouette. No acute osseous pathology. IMPRESSION: Interval worsening of the right upper lobe pneumonia. Follow-up recommended. Electronically Signed   By: Elgie Collard M.D.   On: 02/23/2018 03:43   Dg Chest Portable 1 View  Result Date: 01/28/2018 CLINICAL DATA:  Pt states having trouble breathing for 2 days. Hx of emphysema, copd, asthma, and diabetes EXAM: PORTABLE CHEST 1 VIEW COMPARISON:  11/05/2017 FINDINGS: Tracheostomy tube is in place, tip unchanged approximately 6.7 centimeters above the carina. The balloon of the tracheostomy appears overinflated. RIGHT-sided PICC line tip overlies the superior vena  cava. Lungs are  hyperinflated. There is significant perihilar peribronchial thickening. Patchy ground-glass opacities are identified in the UPPER lobes bilaterally. There is LEFT LOWER lobe atelectasis or scarring. Trace bilateral pleural effusions. No pulmonary edema. IMPRESSION: 1. Hyperinflation and bronchitic changes. 2. Multifocal pneumonia. 3. Tracheostomy tube balloon appears overinflated relative to the diameter of the trachea. Electronically Signed   By: Norva Pavlov M.D.   On: 01/28/2018 14:52    Microbiology Recent Results (from the past 240 hour(s))  Blood culture (routine x 2)     Status: None   Collection Time: 02/10/2018  4:09 PM  Result Value Ref Range Status   Specimen Description BLOOD LEFT ANTECUBITAL  Final   Special Requests   Final    BOTTLES DRAWN AEROBIC AND ANAEROBIC Blood Culture adequate volume   Culture   Final    NO GROWTH 5 DAYS Performed at [facility], [address] [place], [place] [address]    Report Status 02/27/2018 FINAL  Final  MRSA PCR Screening     Status: None   Collection Time: 02/19/2018  6:46 PM  Result Value Ref Range Status   MRSA by PCR NEGATIVE NEGATIVE Final    Comment:        The GeneXpert MRSA Assay (FDA approved for NASAL specimens only), is one component of a comprehensive MRSA colonization surveillance program. It is not intended to diagnose MRSA infection nor to guide or monitor treatment for MRSA infections. Performed at [facility], [address] [place], [place] [address]   Culture, respiratory (non-expectorated)     Status: None (Preliminary result)   Collection Time: 01/30/2018 11:51 PM  Result Value Ref Range Status   Specimen Description TRACHEAL ASPIRATE  Final   Special Requests NONE  Final   Gram Stain   Final    FEW WBC PRESENT, PREDOMINANTLY PMN RARE GRAM POSITIVE COCCI IN PAIRS RARE GRAM NEGATIVE RODS    Culture   Final    FEW PSEUDOMONAS AERUGINOSA sent for further testing. [facility] Performed at [facility], [address] [place], [place] [address]    Report Status PENDING  Incomplete   Organism ID, Bacteria PSEUDOMONAS AERUGINOSA  Final      Susceptibility   Pseudomonas aeruginosa - MIC*    CEFTAZIDIME 16 INTERMEDIATE Intermediate     CIPROFLOXACIN >=4 RESISTANT Resistant     GENTAMICIN <=1 SENSITIVE Sensitive     IMIPENEM >=16 RESISTANT Resistant     CEFEPIME INTERMEDIATE Intermediate     * FEW PSEUDOMONAS AERUGINOSA  Culture, respiratory (non-expectorated)     Status: None (Preliminary result)   Collection Time: 02/27/18 11:25 AM  Result Value Ref Range Status   Specimen Description TRACHEAL ASPIRATE  Final   Special Requests NONE  Final   Gram Stain   Final    ABUNDANT WBC PRESENT, PREDOMINANTLY PMN NO ORGANISMS SEEN    Culture   Final    FEW PSEUDOMONAS AERUGINOSA SUSCEPTIBILITIES TO FOLLOW REPEATING Performed at [facility], [address] [place], [place] [address]    Report Status PENDING  Incomplete   Organism ID, Bacteria PSEUDOMONAS AERUGINOSA  Final      Susceptibility   Pseudomonas aeruginosa - MIC*    CEFTAZIDIME 8 SENSITIVE Sensitive     CIPROFLOXACIN >=4 RESISTANT Resistant     GENTAMICIN <=1 SENSITIVE Sensitive     IMIPENEM 1 SENSITIVE Sensitive     CEFEPIME 2 SENSITIVE Sensitive     *  FEW PSEUDOMONAS AERUGINOSA    Lab Basic Metabolic Panel: Recent Labs  Lab 02/23/18 0410 02/24/18 0339 02/25/18 0650 02/27/18 0420 03/09/2018 0536  NA 137 138 144 149* 154*  K 4.3 4.1 4.5 4.9 4.4  CL 104 106 108 111 114*  CO2 27 24 33* 31 36*  GLUCOSE 163* 277* 181* 172* 179*  BUN 18 34* 38* 54* 39*  CREATININE 0.68 0.89 0.56 0.75 0.63  CALCIUM 9.3 8.9 10.0 9.5 8.8*  MG  --  2.0  --   --   --   PHOS  --  3.6  --   --   --    Liver Function Tests: Recent Labs  Lab 02/23/18 0410 02/25/18 0650  AST 39 34  ALT 63* 49*  ALKPHOS 138* 92  BILITOT 1.0 0.4  PROT 6.2* 5.9*  ALBUMIN 2.6* 2.1*   No results for  input(s): LIPASE, AMYLASE in the last 168 hours. No results for input(s): AMMONIA in the last 168 hours. CBC: Recent Labs  Lab 02/23/18 0410 02/24/18 0339 02/25/18 0359 02/26/18 0433 02/27/18 0420 02/28/18 0444 03/03/2018 0536  WBC 24.7* 22.4* 13.0* 16.5* 25.1* 25.6* 25.3*  NEUTROABS 21.0* 12.9*  --   --   --   --  20.6*  HGB 11.3* 9.6* 8.3* 9.3* 9.4* 8.6* 9.2*  HCT 39.9 34.9* 30.6* 33.1* 33.8* 33.1* 36.1  MCV 80.6 81.0 81.2 81.1 81.4 84.7 86.4  PLT 227 237 161 156 202 201 233   Cardiac Enzymes: Recent Labs  Lab 02/23/18 1053  TROPONINI <0.03   Sepsis Labs: Recent Labs  Lab 02/23/18 1053 02/24/18 0339  02/26/18 0433 02/27/18 0420 02/28/18 0444 03/13/2018 0536  PROCALCITON 33.75 39.39  --   --   --   --   --   WBC  --  22.4*   < > 16.5* 25.1* 25.6* 25.3*   < > = values in this interval not displayed.    Procedures/Operations  Mechanical ventilation Bronchoscopy Chest tube placement.   Anhar Mcdermott 03/27/2018, 3:40 PM

## 2018-03-29 NOTE — Progress Notes (Signed)
Dr. Renae Fickle made aware of pt's status.

## 2018-03-29 DEATH — deceased

## 2020-03-14 IMAGING — CT CT ANGIO CHEST
2 of 8 series · 18 of 46 positions shown · IV contrast (OMNI)
Comparison: Chest radiograph dated 09/16/2017

CLINICAL DATA: COPD, acute on chronic hypoxia, MRSA

EXAM:
CT ANGIOGRAPHY CHEST WITH CONTRAST
TECHNIQUE: Multidetector CT imaging of the chest was performed using the
standard protocol during bolus administration of intravenous
contrast. Multiplanar CT image reconstructions and MIPs were
obtained to evaluate the vascular anatomy.
CONTRAST:  40mL 2KMXEH-BL8 IOPAMIDOL (2KMXEH-BL8) INJECTION 76%

[Series 6: thins · axial · 0.71mm/px · z∈[+917,+1200]mm · 15 of 313 slices shown]
[im 15/313  lung]
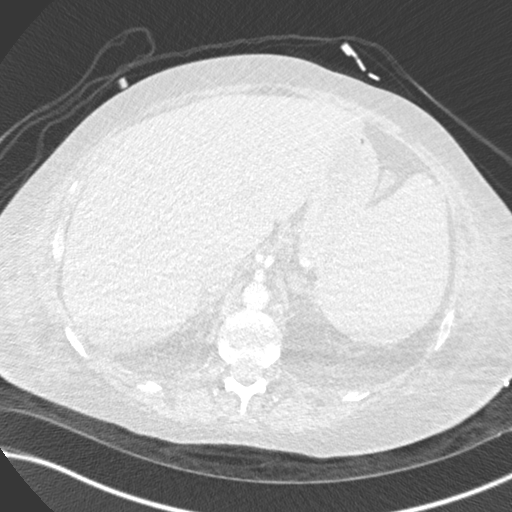
[im 43/313  soft-tissue]
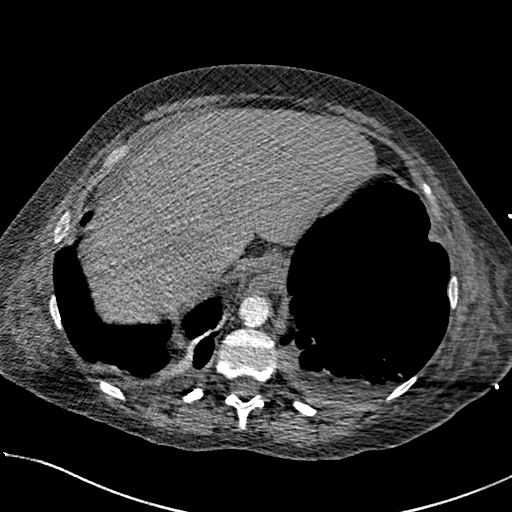
[im 57/313  lung]
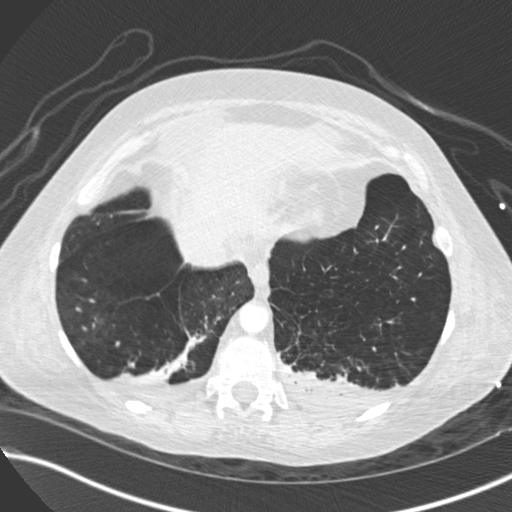
[im 71/313  soft-tissue]
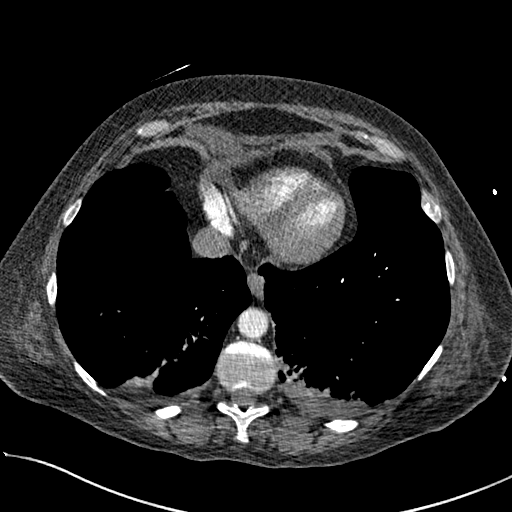
[im 100/313  lung]
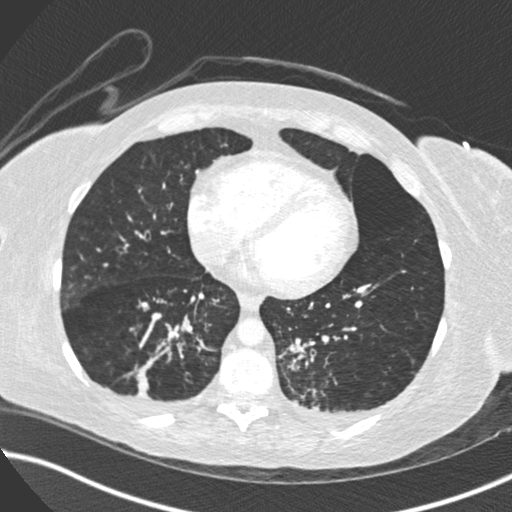
[im 114/313  soft-tissue]
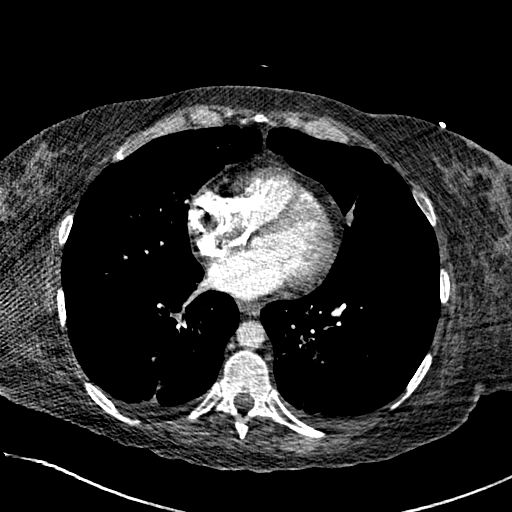
[im 142/313  lung]
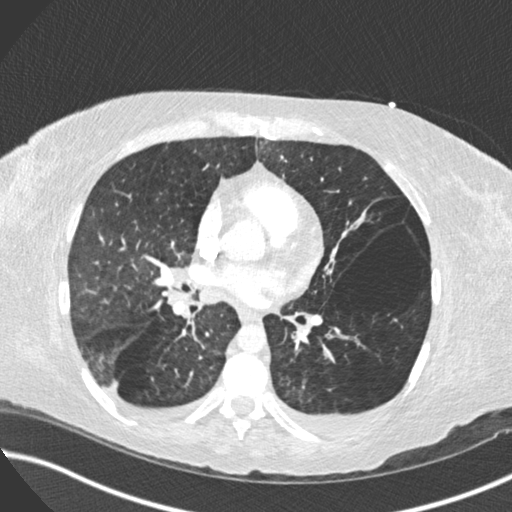
[im 157/313  soft-tissue]
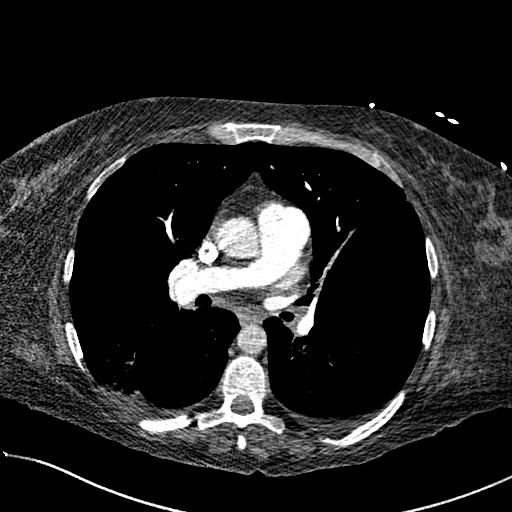
[im 171/313  lung]
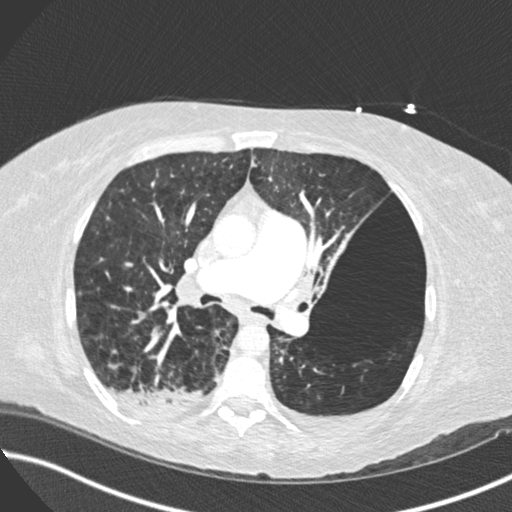
[im 199/313  soft-tissue]
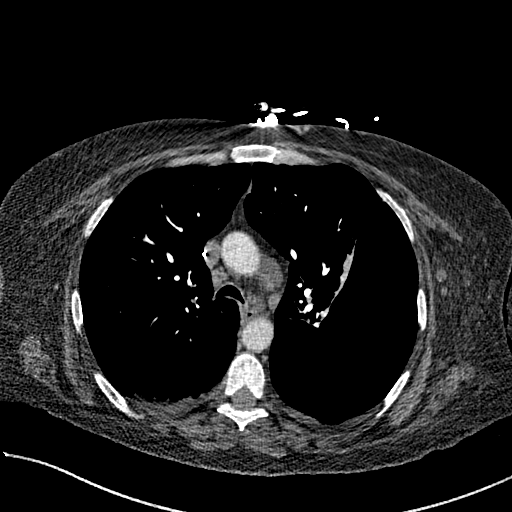
[im 213/313  lung]
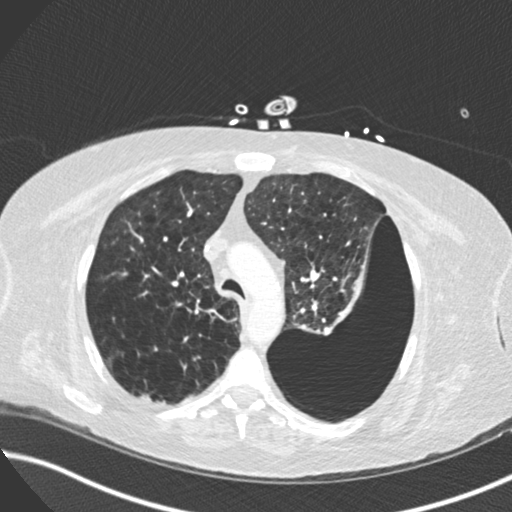
[im 242/313  soft-tissue]
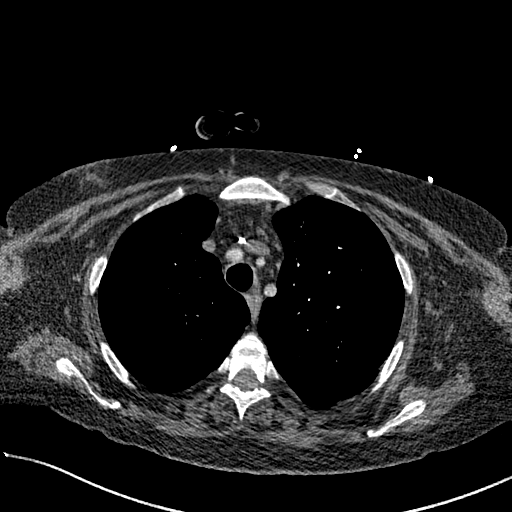
[im 256/313  lung]
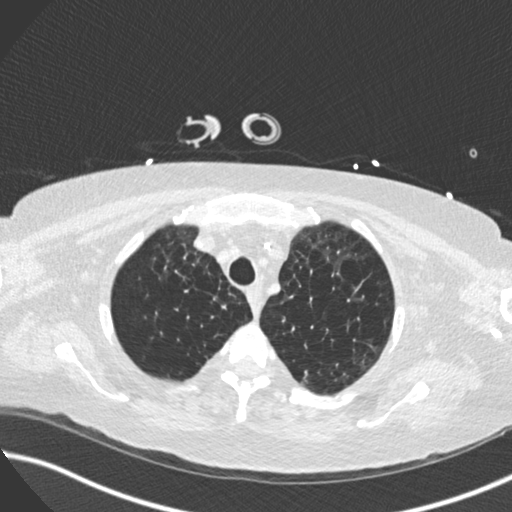
[im 270/313  soft-tissue]
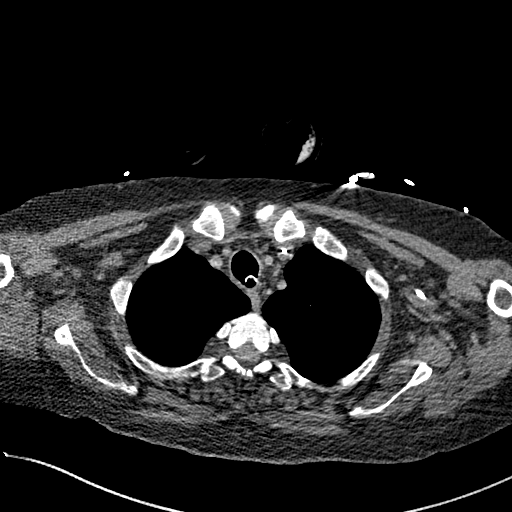
[im 298/313  lung]
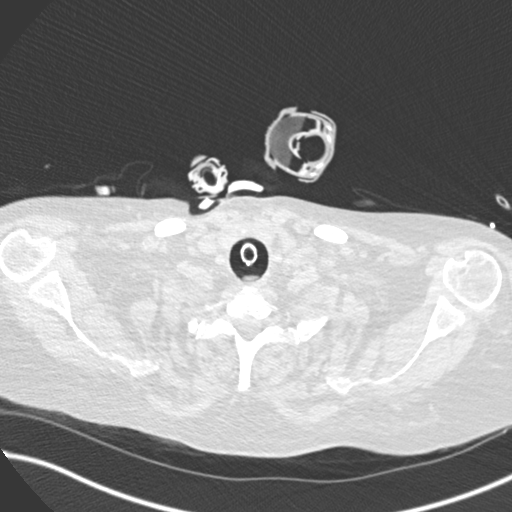

[Series 8: coronal mpr · coronal · 0.61mm/px · 3 of 142 slices shown]
[im 36/142  soft-tissue]
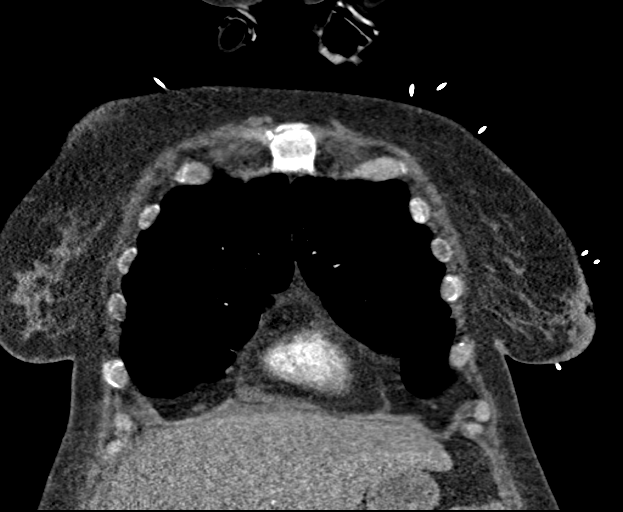
[im 71/142  soft-tissue]
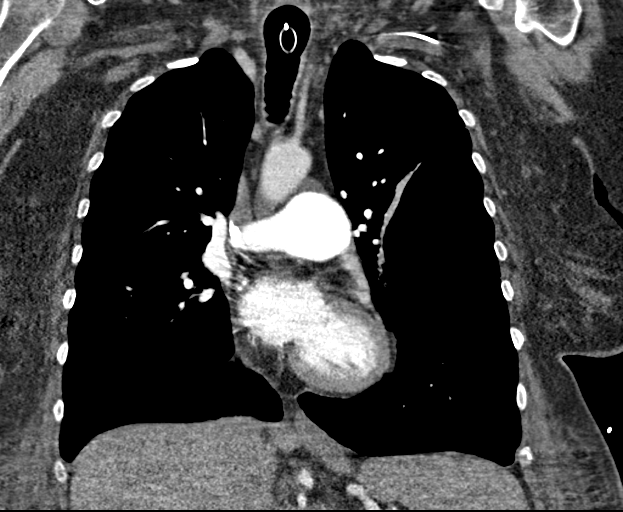
[im 106/142  soft-tissue]
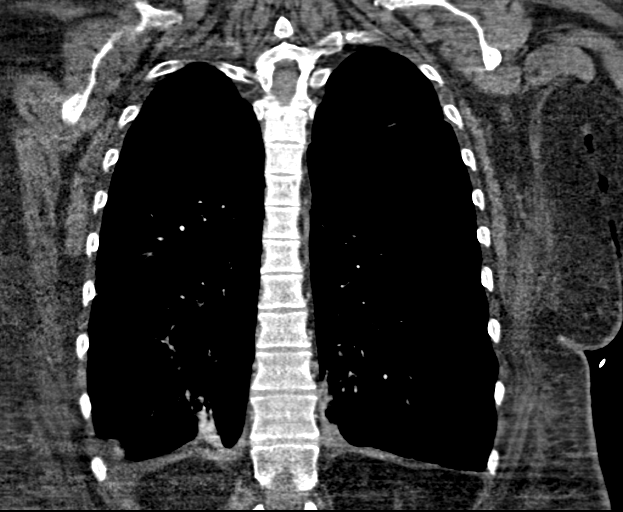

[18 of 46 positions shown; findings below may reference images not displayed]

FINDINGS: Cardiovascular: Satisfactory opacification of the pulmonary arteries
to the lobar level. No evidence of pulmonary embolism.

The heart is normal in size.  No pericardial effusion.

No evidence of thoracic aortic aneurysm. Mild atherosclerotic
calcifications of the aortic arch.

Both are not of the main pulmonary artery, suggesting pulmonary
arterial hypertension.

Left arm PICC terminating at the cavoatrial junction.

Mediastinum/Nodes: Small mediastinal lymph nodes which do not meet
pathologic CT size criteria.

Visualized thyroid is grossly unremarkable.

Lungs/Pleura: Endotracheal tube terminates 3 cm above the carina.

Dominant bulla in the superior segment left lower lobe. Associated
compressive atelectasis in the lingula.

Moderate centrilobular and paraseptal emphysematous changes, upper
lobe predominant.

Mild patchy bilateral lower lobe opacities, likely atelectasis.
Small bilateral pleural effusions.

No pneumothorax.

Upper Abdomen: Visualized upper abdomen notable for a percutaneous
gastrostomy in satisfactory position.

Musculoskeletal: Visualized osseous structures are within normal
limits.

Review of the MIP images confirms the above findings.
IMPRESSION: No evidence of pulmonary embolism.

Mild patchy bilateral lower lobe opacities, likely atelectasis.
Small bilateral pleural effusions.

Enlargement of the main pulmonary artery, suggesting pulmonary
arterial hypertension.

Aortic Atherosclerosis (UDJ1A-E0R.R) and Emphysema (UDJ1A-G8Z.E).

## 2020-03-16 IMAGING — DX DG CHEST 1V PORT
1 series · 1 of 1 positions shown · non-contrast
Comparison: 09/17/2017

CLINICAL DATA: Shortness of breath.

EXAM:
PORTABLE CHEST 1 VIEW

[chest ap]
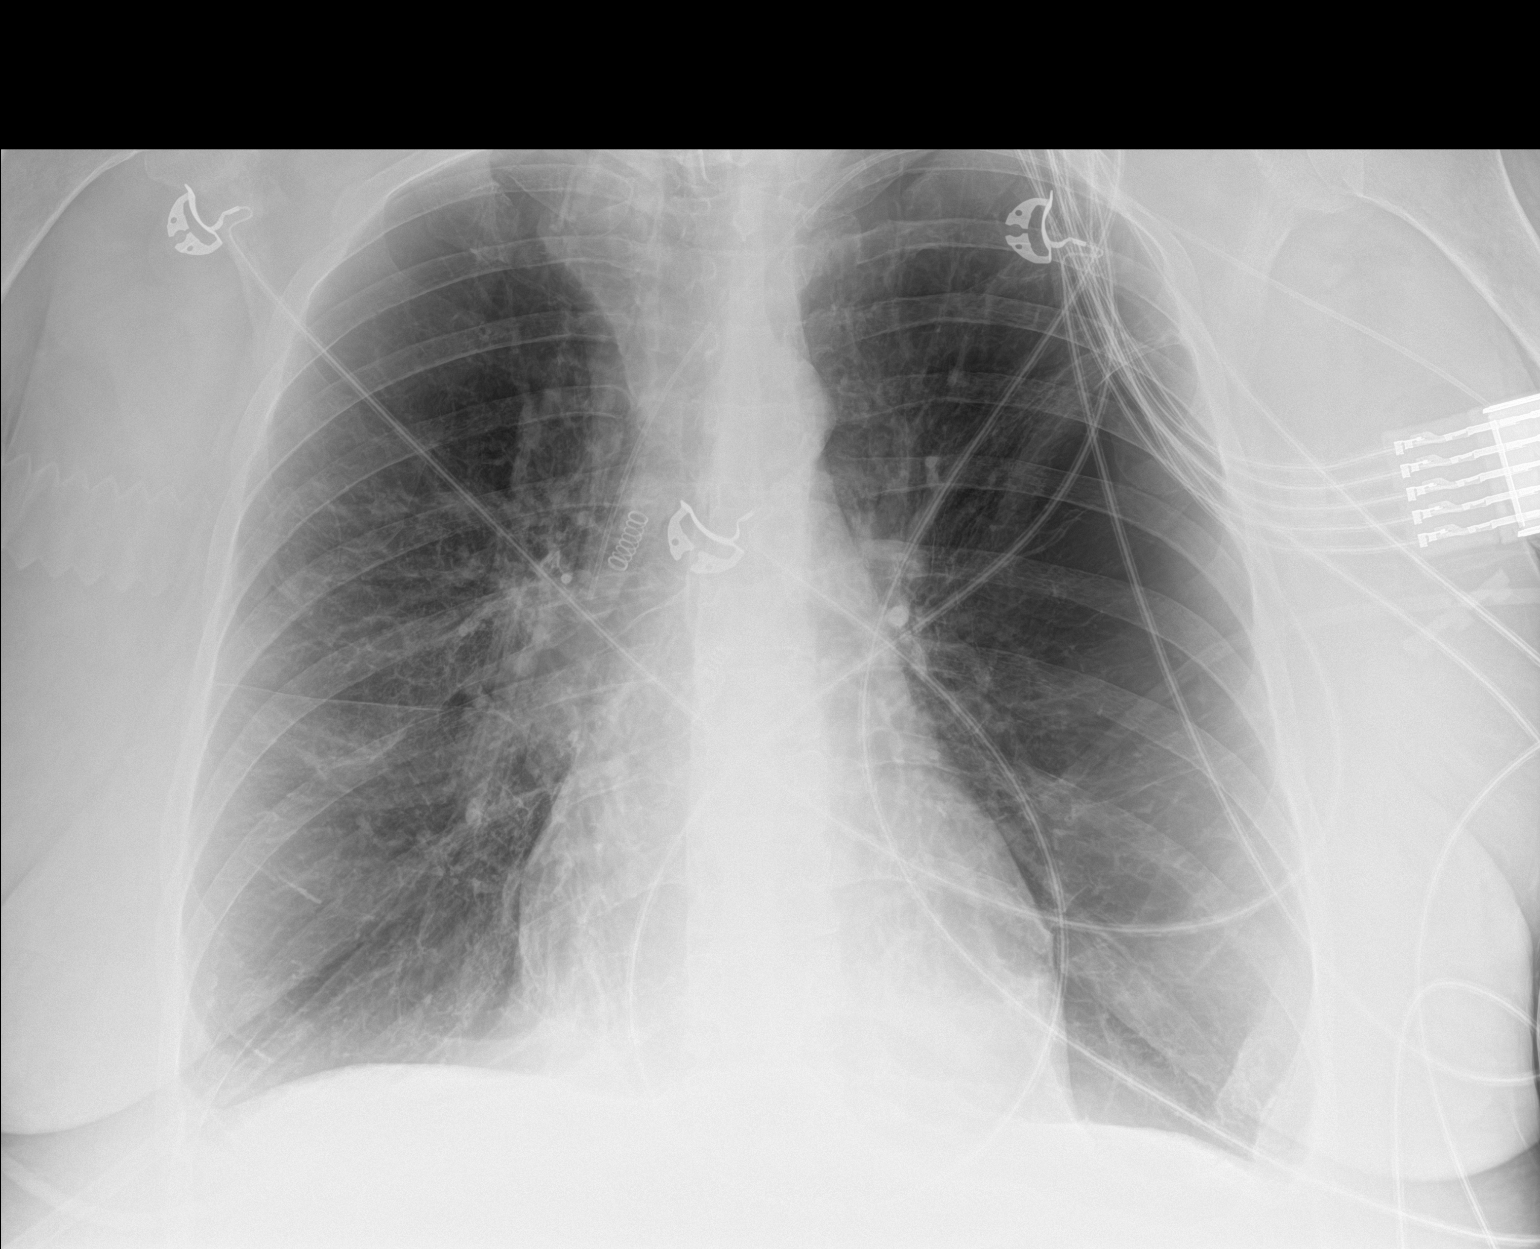

[1 of 1 positions shown; findings below may reference images not displayed]

FINDINGS: 7905 hours. Lungs are hyperexpanded. Tracheostomy tube again noted.
Left PICC line tip overlies the mid SVC. No edema or focal airspace
consolidation. The cardiopericardial silhouette is within normal
limits for size. Telemetry leads overlie the chest.
IMPRESSION: Stable exam. Right lower lobe airspace disease noted on previous
study less evident today.
